# Patient Record
Sex: Male | Born: 1935
Health system: Southern US, Community
[De-identification: ages and names within clinical notes are randomized; demographics above are authoritative.]

## PROBLEM LIST (undated history)

## (undated) DIAGNOSIS — G5600 Carpal tunnel syndrome, unspecified upper limb: Secondary | ICD-10-CM

## (undated) DIAGNOSIS — E291 Testicular hypofunction: Principal | ICD-10-CM

## (undated) DIAGNOSIS — M5030 Other cervical disc degeneration, unspecified cervical region: Secondary | ICD-10-CM

## (undated) DIAGNOSIS — R351 Nocturia: Secondary | ICD-10-CM

## (undated) DIAGNOSIS — J3089 Other allergic rhinitis: Secondary | ICD-10-CM

## (undated) DIAGNOSIS — E785 Hyperlipidemia, unspecified: Secondary | ICD-10-CM

## (undated) DIAGNOSIS — U071 COVID-19: Secondary | ICD-10-CM

## (undated) DIAGNOSIS — Z950 Presence of cardiac pacemaker: Secondary | ICD-10-CM

## (undated) DIAGNOSIS — N400 Enlarged prostate without lower urinary tract symptoms: Secondary | ICD-10-CM

## (undated) DIAGNOSIS — K219 Gastro-esophageal reflux disease without esophagitis: Secondary | ICD-10-CM

## (undated) DIAGNOSIS — I2510 Atherosclerotic heart disease of native coronary artery without angina pectoris: Secondary | ICD-10-CM

## (undated) DIAGNOSIS — I10 Essential (primary) hypertension: Secondary | ICD-10-CM

## (undated) DIAGNOSIS — R634 Abnormal weight loss: Secondary | ICD-10-CM

## (undated) DIAGNOSIS — N529 Male erectile dysfunction, unspecified: Secondary | ICD-10-CM

## (undated) DIAGNOSIS — H409 Unspecified glaucoma: Secondary | ICD-10-CM

## (undated) HISTORY — PX: APPENDECTOMY: SHX54

## (undated) HISTORY — DX: Other cervical disc degeneration, unspecified cervical region: M50.30

## (undated) HISTORY — DX: Testicular hypofunction: E29.1

## (undated) HISTORY — DX: Hyperlipidemia, unspecified: E78.5

## (undated) HISTORY — DX: Atherosclerotic heart disease of native coronary artery without angina pectoris: I25.10

## (undated) HISTORY — PX: BILATERAL CARPAL TUNNEL RELEASE: SHX6508

## (undated) HISTORY — PX: KIDNEY STONE SURGERY: SHX686

## (undated) HISTORY — PX: CORONARY ANGIOPLASTY: SHX604

## (undated) HISTORY — DX: COVID-19: U07.1

## (undated) HISTORY — DX: Nocturia: R35.1

## (undated) HISTORY — PX: OTHER SURGICAL HISTORY: SHX169

## (undated) HISTORY — DX: Carpal tunnel syndrome, unspecified upper limb: G56.00

## (undated) HISTORY — DX: Benign prostatic hyperplasia without lower urinary tract symptoms: N40.0

## (undated) HISTORY — PX: SHOULDER ARTHROSCOPY W/ ROTATOR CUFF REPAIR: SHX2400

## (undated) HISTORY — PX: TONSILLECTOMY: SUR1361

## (undated) HISTORY — DX: Male erectile dysfunction, unspecified: N52.9

---

## 1998-05-02 DIAGNOSIS — I10 Essential (primary) hypertension: Secondary | ICD-10-CM | POA: Insufficient documentation

## 2002-05-02 HISTORY — PX: CARDIAC CATHETERIZATION: SHX172

## 2003-05-03 HISTORY — PX: CORONARY STENT PLACEMENT: SHX1402

## 2005-05-02 DIAGNOSIS — M940 Chondrocostal junction syndrome [Tietze]: Secondary | ICD-10-CM | POA: Insufficient documentation

## 2005-05-02 HISTORY — PX: CORONARY ARTERY BYPASS GRAFT: SHX141

## 2005-09-11 ENCOUNTER — Other Ambulatory Visit: Payer: Self-pay

## 2005-09-11 ENCOUNTER — Emergency Department: Payer: Self-pay | Admitting: Emergency Medicine

## 2005-10-26 ENCOUNTER — Encounter: Payer: Self-pay | Admitting: Family Medicine

## 2005-10-30 ENCOUNTER — Encounter: Payer: Self-pay | Admitting: Family Medicine

## 2005-11-30 ENCOUNTER — Encounter: Payer: Self-pay | Admitting: Family Medicine

## 2005-12-31 ENCOUNTER — Encounter: Payer: Self-pay | Admitting: Family Medicine

## 2006-01-30 ENCOUNTER — Encounter: Payer: Self-pay | Admitting: Family Medicine

## 2006-08-03 ENCOUNTER — Ambulatory Visit: Payer: Self-pay

## 2007-03-12 ENCOUNTER — Ambulatory Visit: Payer: Self-pay | Admitting: Family Medicine

## 2007-04-09 ENCOUNTER — Ambulatory Visit: Payer: Self-pay | Admitting: Unknown Physician Specialty

## 2007-04-11 ENCOUNTER — Ambulatory Visit: Payer: Self-pay | Admitting: Unknown Physician Specialty

## 2007-06-18 ENCOUNTER — Ambulatory Visit: Payer: Self-pay | Admitting: Internal Medicine

## 2007-07-05 ENCOUNTER — Ambulatory Visit (HOSPITAL_COMMUNITY): Admission: RE | Admit: 2007-07-05 | Discharge: 2007-07-05 | Payer: Self-pay | Admitting: Gastroenterology

## 2007-07-09 ENCOUNTER — Ambulatory Visit: Payer: Self-pay | Admitting: Gastroenterology

## 2007-08-07 ENCOUNTER — Ambulatory Visit: Payer: Self-pay | Admitting: Internal Medicine

## 2007-08-07 LAB — CONVERTED CEMR LAB
ALT: 27 units/L (ref 0–53)
AST: 28 units/L (ref 0–37)
Alkaline Phosphatase: 50 units/L (ref 39–117)
BUN: 12 mg/dL (ref 6–23)
Basophils Absolute: 0 10*3/uL (ref 0.0–0.1)
Chloride: 107 meq/L (ref 96–112)
Creatinine, Ser: 1 mg/dL (ref 0.4–1.5)
Eosinophils Absolute: 0.1 10*3/uL (ref 0.0–0.7)
Eosinophils Relative: 1.7 % (ref 0.0–5.0)
HCT: 39.2 % (ref 39.0–52.0)
Lipase: 37 units/L (ref 11.0–59.0)
MCHC: 32.6 g/dL (ref 30.0–36.0)
MCV: 98.7 fL (ref 78.0–100.0)
Monocytes Absolute: 0.6 10*3/uL (ref 0.1–1.0)
Neutrophils Relative %: 75.3 % (ref 43.0–77.0)
Platelets: 162 10*3/uL (ref 150–400)
Potassium: 4.9 meq/L (ref 3.5–5.1)
WBC: 6.1 10*3/uL (ref 4.5–10.5)

## 2007-10-31 ENCOUNTER — Ambulatory Visit: Payer: Self-pay | Admitting: Unknown Physician Specialty

## 2008-02-13 ENCOUNTER — Emergency Department: Payer: Self-pay | Admitting: Emergency Medicine

## 2008-05-08 ENCOUNTER — Ambulatory Visit: Payer: Self-pay | Admitting: Unknown Physician Specialty

## 2010-08-02 ENCOUNTER — Ambulatory Visit: Payer: Self-pay | Admitting: Unknown Physician Specialty

## 2010-08-02 HISTORY — PX: UPPER GI ENDOSCOPY: SHX6162

## 2010-08-03 LAB — PATHOLOGY REPORT

## 2010-09-14 NOTE — Assessment & Plan Note (Signed)
HEALTHCARE                         GASTROENTEROLOGY OFFICE NOTE   NAME:Sanderson, KAYCEE MCGAUGH                       MRN:          [id number]  DATE:08/07/2007                            DOB:          01-24-1932    HISTORY:  Mr. Rafferty presents today for followup.  He was initially  evaluated June 18, 2007 for biliary ductal dilation and  consideration for endoscopic retrograde cholangiopancreatography.  See  that dictation for details.   His chief complaint today is that of some mild epigastric soreness as  well as periodic diarrhea, which occurs on a semi-weekly basis.  The  patient had some abdominal discomfort prior to his last evaluation.  He  underwent endoscopic ultrasound which revealed a common duct stone.  At  that same time, his exam was converted to an ERCP with sphincterotomy  and stone extraction.  No other abnormalities of the pancreas or  pancreatic or biliary system were noted on either exam.  He states that  since his procedure, the bulk of his discomfort has improved.  His  appetite is good and his weight has been stable.  He denies nausea,  vomiting, or bleeding.  As mentioned he does have some periodic loose  stools and increased intestinal gas.  No steatorrhea.  He is  anticipating followup with Dr. Mechele Collin in the next few weeks, to resume  his GI care.   ALLERGIES:  HE HAS NO KNOWN DRUG ALLERGIES.   CURRENT MEDICATIONS:  Include:  Plavix,  Vytorin, Metoprolol, enalapril,  omeprazole, APAP/hydrocodone, aspirin, Zolpidem, and Loratadine p.r.n.   PHYSICAL EXAMINATION:  Well-appearing male in no acute distress.  Blood  pressure is 130/68.  Heart rate 68.  Respirations 20 and regular.  His  weight is 175.2 pounds (increased 0.6 pounds).  HEENT:  Sclerae anicteric.  Conjunctivae are pink.  Oral mucosa is  intact.  No adenopathy.  LUNGS:  Clear.  HEART:  Regular.  ABDOMEN:  Soft without tenderness, masses, or hernia.  EXTREMITIES:   Without edema.   IMPRESSION:  1. Choledocholithiasis status post ERCP with biliary sphincterotomy      and stone extraction.  2. Endoscopic ultrasound, negative except for choledocholithiasis.  3. Vague epigastric discomfort and gas, ongoing.  4. Intermittent loose stools without worrisome features.  Prior      negative colonoscopy with Dr. Mechele Collin.   RECOMMENDATION:  1. Laboratories today have been ordered.  CBC was normal except for a      hemoglobin of 12.8 (stable compared to 12-08).  Comprehensive      metabolic panel was normal.  2. Empiric treatment with the Probiotic Align 1 daily for 2 weeks to      see if this helps with gas and occassional diarrhea.  Samples have      been give.  3. Resume GI care with Dr. Mechele Collin as already plannrd.  Followup in      this office as needed or requested by Dr.  Mechele Collin.     Wilhemina Bonito. Marina Goodell, MD  Electronically Signed    JNP/MedQ  DD: 08/08/2007  DT: 08/08/2007  Job #: [id number]  cc:   Suzette Battiest

## 2010-09-14 NOTE — Assessment & Plan Note (Signed)
[facility]                         GASTROENTEROLOGY OFFICE NOTE   NAME:Ivan Salazar, Ivan Salazar                       MRN:          [id number]  DATE:06/18/2007                            DOB:          1931-12-03    REFERRING PHYSICIAN:  Lynnae Prude   REASON FOR CONSULTATION:  Biliary ductal dilation and consideration of  endoscopic retrograde cholangiopancreatography.   HISTORY:  This is a pleasant 77 year old white male with a history of  hypertension, dyslipidemia and coronary artery disease, for which he has  undergone a coronary artery bypass grafting.  The patient has had  problems with nausea, dating back approximately one year.  He was put on  proton pump inhibitors, which he took sporadically.  In November, his  nausea became daily.  As well, he began to develop a gnawing discomfort  in the mid-abdomen.  The discomfort seemed to be relieved with meals.  If he would eat a normal supper, he would have no problems the rest of  the evening.  He denies heartburn, indigestion, dysphagia, vomiting,  fevers or jaundice.  The discomfort does not radiate.  He has had no  weight loss.  His bowel habits tend to be loose, two to three times per  day.  He does use Imodium.  The patient is being evaluated for his pain  and nausea by Dr. Lynnae Prude.  An upper endoscopy was performed in  early December and revealed a Schatzki's ring, hiatal hernia and  nonspecific gastritis.  He is anticipating a colonoscopy with Dr.  Mechele Collin in March.  The patient did have a CT scan, abdominal ultrasound  and a HIDA scan.  I have those reports but do not have the compact disks  for review.  The CT scan was said to show intra and extrahepatic biliary  ductal dilation.  No dimensions given.  There were no other problems  such as mass or stones.  He is status post cholecystectomy and these  changes were possibly felt to be physiologic.  An ultrasound revealed a  13 mm common  duct.  No mention of intrahepatic dilation.  The HIDA scan  was unremarkable postcholecystectomy.  He did undergo laboratories in early November.  These were also  unrevealing.  Of importance, there was no evidence of anemia.  Furthermore, his liver tests were entirely normal.  ERCP was recommended  and the patient requsted seeing me.   PAST MEDICAL HISTORY:  As above.   PAST SURGICAL HISTORY:  In addition to his cholecystectomy and coronary  artery bypass graft surgery, the patient has had bilateral rotator cuff  surgery, tonsillectomy, an appendectomy and a hernia repair.   ALLERGIES:  No known drug allergies.   CURRENT MEDICATIONS:  1. Plavix 75 mg daily.  2. Vytorin 10/80 mg daily.  3. Metoprolol 25 mg twice daily.  4. Enalapril 5 mg daily.  5. Omeprazole 20 mg twice daily.  6. A-PAP with hydrocodone 500/7.5 mg p.r.n.  7. Aspirin.  8. Ambien.  9. Loratadine.   FAMILY HISTORY:  Negative for gastrointestinal malignancy.   SOCIAL HISTORY:  The patient is married  with five children.  His wife is  currently in a long-term nursing facility.  He does not smoke.  He has  two or three drinks per night.   REVIEW OF SYSTEMS:  Per diagnostic evaluation form.   PHYSICAL EXAMINATION:  GENERAL:  A well-appearing male, in no acute  distress.  VITAL SIGNS:  Blood pressure 114/62, heart rate 60, weight 174.6 pounds,  height 5 feet 10 inches.  HEENT:  Sclerae anicteric.  Conjunctivae pink.  Oral mucosa intact.  NECK:  There is no adenopathy.  LUNGS:  Clear.  HEART:  Regular.  ABDOMEN:  Soft without tenderness, mass or hernia.  Previous surgical  incisions are well-healed.  Good bowel sounds heard.  EXTREMITIES:  Without edema.   IMPRESSION:  1. Longstanding vague nausea and gnawing discomfort, improved with      meals.  No other significant symptoms or worrisome features.  2. Extra-hepatic biliary dilation on ultrasound.  Reported      intrahepatic and extrahepatic ductal dilation  on CT, with normal      liver tests.  I imagine this is non-pathologic.  Further evaluation      should be pursued, however.  3. Multiple medical problems, including Plavix therapy.   RECOMMENDATIONS:  At this point, I think the best strategy would be to  proceed with an endoscopic ultrasound.  The endoscopic ultrasound would  allow for visualization of the bile duct as well as the pancreas and  ampulla.  If this is negative, then nothing further would need to be  done, and the patient would avoid the potential risks associated with  ERCP. However, if biliary pathology or pancreatic pathology is  identified, then the patient could undergo diagnostic biopsy and/or  appropriate therapeutics at that time.  I discussed this with him, as  well as the role for MRCP.  The patient is in agreement with proceeding  to endoscopic ultrasound plus or minus concurrent endoscopic retrograde  cholangiopancreatography, depending upon the findings.  I have discussed  with him the nature of the procedure, as well as the risks, benefits and  alternatives.  I explained to him that my colleague, Dr. Rachael Fee, is the local expert for this procedure, and we would arrange an  exam with Dr. Christella Hartigan. I have discussed the case with Dr. Christella Hartigan.  Furthermore, we will communicate with Dr. Christella Noa (his  cardiologist), to see if it would be acceptable to take the patient off  of Plavix for five to seven days prior to his procedure, should  therapeutics be needed.  Again, he understood these issues and agreed to  proceed along these lines.  After his procedure with Dr. Christella Hartigan, he will  return to the care of Dr. Lynnae Prude for further evaluation and treatment of his abdominal complaints  as deemed neccessary by Dr. Mechele Collin.     Wilhemina Bonito. Marina Goodell, MD  Electronically Signed    JNP/MedQ  DD: 06/18/2007  DT: 06/19/2007  Job #: [id number]   cc:   Kateri Mc, M.D.  Rachael Fee, MD

## 2011-01-18 DIAGNOSIS — M7070 Other bursitis of hip, unspecified hip: Secondary | ICD-10-CM | POA: Insufficient documentation

## 2011-05-05 ENCOUNTER — Ambulatory Visit: Payer: Self-pay | Admitting: Specialist

## 2011-05-05 DIAGNOSIS — Z01812 Encounter for preprocedural laboratory examination: Secondary | ICD-10-CM | POA: Diagnosis not present

## 2011-05-05 DIAGNOSIS — Z01810 Encounter for preprocedural cardiovascular examination: Secondary | ICD-10-CM | POA: Diagnosis not present

## 2011-05-05 DIAGNOSIS — I10 Essential (primary) hypertension: Secondary | ICD-10-CM | POA: Diagnosis not present

## 2011-05-05 DIAGNOSIS — G5600 Carpal tunnel syndrome, unspecified upper limb: Secondary | ICD-10-CM | POA: Diagnosis not present

## 2011-05-05 LAB — BASIC METABOLIC PANEL
BUN: 28 mg/dL — ABNORMAL HIGH (ref 7–18)
EGFR (Non-African Amer.): >60
Glucose: 100 mg/dL — ABNORMAL HIGH (ref 65–99)
Potassium: 5.3 mmol/L — ABNORMAL HIGH (ref 3.5–5.1)
Sodium: 142 mmol/L (ref 136–145)

## 2011-05-05 LAB — CBC WITH DIFFERENTIAL/PLATELET
Basophil %: 0.3 %
Eosinophil #: 0.1 10*3/uL (ref 0.0–0.7)
Eosinophil %: 2.1 %
HGB: 12.7 g/dL — ABNORMAL LOW (ref 13.0–18.0)
Lymphocyte %: 13.6 %
MCH: 33.4 pg (ref 26.0–34.0)
Monocyte %: 9.8 %
Neutrophil %: 74.2 %
RBC: 3.8 10*6/uL — ABNORMAL LOW (ref 4.40–5.90)

## 2011-05-11 DIAGNOSIS — E291 Testicular hypofunction: Secondary | ICD-10-CM | POA: Diagnosis not present

## 2011-05-18 ENCOUNTER — Ambulatory Visit: Payer: Self-pay | Admitting: Specialist

## 2011-05-18 DIAGNOSIS — R609 Edema, unspecified: Secondary | ICD-10-CM | POA: Diagnosis not present

## 2011-05-18 DIAGNOSIS — G5600 Carpal tunnel syndrome, unspecified upper limb: Secondary | ICD-10-CM | POA: Diagnosis not present

## 2011-05-18 DIAGNOSIS — K449 Diaphragmatic hernia without obstruction or gangrene: Secondary | ICD-10-CM | POA: Diagnosis not present

## 2011-05-18 DIAGNOSIS — K219 Gastro-esophageal reflux disease without esophagitis: Secondary | ICD-10-CM | POA: Diagnosis not present

## 2011-05-18 DIAGNOSIS — Z951 Presence of aortocoronary bypass graft: Secondary | ICD-10-CM | POA: Diagnosis not present

## 2011-05-18 DIAGNOSIS — M1610 Unilateral primary osteoarthritis, unspecified hip: Secondary | ICD-10-CM | POA: Diagnosis not present

## 2011-05-18 DIAGNOSIS — Z79899 Other long term (current) drug therapy: Secondary | ICD-10-CM | POA: Diagnosis not present

## 2011-05-18 DIAGNOSIS — I10 Essential (primary) hypertension: Secondary | ICD-10-CM | POA: Diagnosis not present

## 2011-05-18 DIAGNOSIS — I252 Old myocardial infarction: Secondary | ICD-10-CM | POA: Diagnosis not present

## 2011-05-25 DIAGNOSIS — G4700 Insomnia, unspecified: Secondary | ICD-10-CM | POA: Diagnosis not present

## 2011-05-25 DIAGNOSIS — S51809A Unspecified open wound of unspecified forearm, initial encounter: Secondary | ICD-10-CM | POA: Diagnosis not present

## 2011-05-30 DIAGNOSIS — G5600 Carpal tunnel syndrome, unspecified upper limb: Secondary | ICD-10-CM | POA: Diagnosis not present

## 2011-06-01 ENCOUNTER — Ambulatory Visit: Payer: Self-pay | Admitting: Specialist

## 2011-06-01 DIAGNOSIS — G5600 Carpal tunnel syndrome, unspecified upper limb: Secondary | ICD-10-CM | POA: Diagnosis not present

## 2011-06-01 DIAGNOSIS — Z7902 Long term (current) use of antithrombotics/antiplatelets: Secondary | ICD-10-CM | POA: Diagnosis not present

## 2011-06-01 DIAGNOSIS — I2510 Atherosclerotic heart disease of native coronary artery without angina pectoris: Secondary | ICD-10-CM | POA: Diagnosis not present

## 2011-06-01 DIAGNOSIS — Z951 Presence of aortocoronary bypass graft: Secondary | ICD-10-CM | POA: Diagnosis not present

## 2011-06-01 DIAGNOSIS — K219 Gastro-esophageal reflux disease without esophagitis: Secondary | ICD-10-CM | POA: Diagnosis not present

## 2011-06-01 DIAGNOSIS — Z79899 Other long term (current) drug therapy: Secondary | ICD-10-CM | POA: Diagnosis not present

## 2011-06-01 DIAGNOSIS — I10 Essential (primary) hypertension: Secondary | ICD-10-CM | POA: Diagnosis not present

## 2011-06-01 DIAGNOSIS — R609 Edema, unspecified: Secondary | ICD-10-CM | POA: Diagnosis not present

## 2011-06-13 DIAGNOSIS — G5600 Carpal tunnel syndrome, unspecified upper limb: Secondary | ICD-10-CM | POA: Diagnosis not present

## 2011-06-14 DIAGNOSIS — F4323 Adjustment disorder with mixed anxiety and depressed mood: Secondary | ICD-10-CM | POA: Diagnosis not present

## 2011-06-15 DIAGNOSIS — E291 Testicular hypofunction: Secondary | ICD-10-CM | POA: Diagnosis not present

## 2011-06-28 DIAGNOSIS — F4323 Adjustment disorder with mixed anxiety and depressed mood: Secondary | ICD-10-CM | POA: Diagnosis not present

## 2011-07-19 DIAGNOSIS — F4323 Adjustment disorder with mixed anxiety and depressed mood: Secondary | ICD-10-CM | POA: Diagnosis not present

## 2011-07-21 DIAGNOSIS — Z79899 Other long term (current) drug therapy: Secondary | ICD-10-CM | POA: Diagnosis not present

## 2011-07-21 DIAGNOSIS — E291 Testicular hypofunction: Secondary | ICD-10-CM | POA: Diagnosis not present

## 2011-07-21 DIAGNOSIS — N401 Enlarged prostate with lower urinary tract symptoms: Secondary | ICD-10-CM | POA: Diagnosis not present

## 2011-07-26 DIAGNOSIS — F4323 Adjustment disorder with mixed anxiety and depressed mood: Secondary | ICD-10-CM | POA: Diagnosis not present

## 2011-08-02 DIAGNOSIS — F4323 Adjustment disorder with mixed anxiety and depressed mood: Secondary | ICD-10-CM | POA: Diagnosis not present

## 2011-08-09 DIAGNOSIS — E785 Hyperlipidemia, unspecified: Secondary | ICD-10-CM | POA: Diagnosis not present

## 2011-08-09 DIAGNOSIS — I10 Essential (primary) hypertension: Secondary | ICD-10-CM | POA: Diagnosis not present

## 2011-08-10 DIAGNOSIS — G5600 Carpal tunnel syndrome, unspecified upper limb: Secondary | ICD-10-CM | POA: Diagnosis not present

## 2011-08-15 DIAGNOSIS — E785 Hyperlipidemia, unspecified: Secondary | ICD-10-CM | POA: Diagnosis not present

## 2011-08-15 DIAGNOSIS — I10 Essential (primary) hypertension: Secondary | ICD-10-CM | POA: Diagnosis not present

## 2011-08-15 DIAGNOSIS — G5600 Carpal tunnel syndrome, unspecified upper limb: Secondary | ICD-10-CM | POA: Diagnosis not present

## 2011-08-16 DIAGNOSIS — F4323 Adjustment disorder with mixed anxiety and depressed mood: Secondary | ICD-10-CM | POA: Diagnosis not present

## 2011-08-24 DIAGNOSIS — E291 Testicular hypofunction: Secondary | ICD-10-CM | POA: Diagnosis not present

## 2011-08-26 DIAGNOSIS — G5600 Carpal tunnel syndrome, unspecified upper limb: Secondary | ICD-10-CM | POA: Diagnosis not present

## 2011-08-30 DIAGNOSIS — G5600 Carpal tunnel syndrome, unspecified upper limb: Secondary | ICD-10-CM | POA: Diagnosis not present

## 2011-08-30 DIAGNOSIS — H4010X0 Unspecified open-angle glaucoma, stage unspecified: Secondary | ICD-10-CM | POA: Diagnosis not present

## 2011-09-01 DIAGNOSIS — G5600 Carpal tunnel syndrome, unspecified upper limb: Secondary | ICD-10-CM | POA: Diagnosis not present

## 2011-09-05 DIAGNOSIS — G5600 Carpal tunnel syndrome, unspecified upper limb: Secondary | ICD-10-CM | POA: Diagnosis not present

## 2011-09-13 DIAGNOSIS — F4323 Adjustment disorder with mixed anxiety and depressed mood: Secondary | ICD-10-CM | POA: Diagnosis not present

## 2011-09-13 DIAGNOSIS — I10 Essential (primary) hypertension: Secondary | ICD-10-CM | POA: Diagnosis not present

## 2011-09-14 DIAGNOSIS — G5600 Carpal tunnel syndrome, unspecified upper limb: Secondary | ICD-10-CM | POA: Diagnosis not present

## 2011-09-15 DIAGNOSIS — I2510 Atherosclerotic heart disease of native coronary artery without angina pectoris: Secondary | ICD-10-CM | POA: Diagnosis not present

## 2011-09-15 DIAGNOSIS — I517 Cardiomegaly: Secondary | ICD-10-CM | POA: Diagnosis not present

## 2011-09-15 DIAGNOSIS — I10 Essential (primary) hypertension: Secondary | ICD-10-CM | POA: Diagnosis not present

## 2011-09-15 DIAGNOSIS — E782 Mixed hyperlipidemia: Secondary | ICD-10-CM | POA: Diagnosis not present

## 2011-09-15 DIAGNOSIS — Z7902 Long term (current) use of antithrombotics/antiplatelets: Secondary | ICD-10-CM | POA: Diagnosis not present

## 2011-09-15 DIAGNOSIS — I359 Nonrheumatic aortic valve disorder, unspecified: Secondary | ICD-10-CM | POA: Diagnosis not present

## 2011-09-15 DIAGNOSIS — Z79899 Other long term (current) drug therapy: Secondary | ICD-10-CM | POA: Diagnosis not present

## 2011-09-15 DIAGNOSIS — I059 Rheumatic mitral valve disease, unspecified: Secondary | ICD-10-CM | POA: Diagnosis not present

## 2011-09-15 DIAGNOSIS — Z951 Presence of aortocoronary bypass graft: Secondary | ICD-10-CM | POA: Diagnosis not present

## 2011-09-22 DIAGNOSIS — G5600 Carpal tunnel syndrome, unspecified upper limb: Secondary | ICD-10-CM | POA: Diagnosis not present

## 2011-09-28 DIAGNOSIS — E291 Testicular hypofunction: Secondary | ICD-10-CM | POA: Diagnosis not present

## 2011-10-21 DIAGNOSIS — L570 Actinic keratosis: Secondary | ICD-10-CM | POA: Diagnosis not present

## 2011-10-21 DIAGNOSIS — D0460 Carcinoma in situ of skin of unspecified upper limb, including shoulder: Secondary | ICD-10-CM | POA: Diagnosis not present

## 2011-10-21 DIAGNOSIS — D485 Neoplasm of uncertain behavior of skin: Secondary | ICD-10-CM | POA: Diagnosis not present

## 2011-10-21 DIAGNOSIS — L820 Inflamed seborrheic keratosis: Secondary | ICD-10-CM | POA: Diagnosis not present

## 2011-10-21 DIAGNOSIS — Z85828 Personal history of other malignant neoplasm of skin: Secondary | ICD-10-CM | POA: Diagnosis not present

## 2011-10-25 DIAGNOSIS — F4323 Adjustment disorder with mixed anxiety and depressed mood: Secondary | ICD-10-CM | POA: Diagnosis not present

## 2011-11-01 DIAGNOSIS — E291 Testicular hypofunction: Secondary | ICD-10-CM | POA: Diagnosis not present

## 2011-11-22 DIAGNOSIS — F4323 Adjustment disorder with mixed anxiety and depressed mood: Secondary | ICD-10-CM | POA: Diagnosis not present

## 2011-11-25 DIAGNOSIS — D0460 Carcinoma in situ of skin of unspecified upper limb, including shoulder: Secondary | ICD-10-CM | POA: Diagnosis not present

## 2011-12-13 DIAGNOSIS — E291 Testicular hypofunction: Secondary | ICD-10-CM | POA: Diagnosis not present

## 2012-01-31 DIAGNOSIS — E291 Testicular hypofunction: Secondary | ICD-10-CM | POA: Diagnosis not present

## 2012-02-07 DIAGNOSIS — F4323 Adjustment disorder with mixed anxiety and depressed mood: Secondary | ICD-10-CM | POA: Diagnosis not present

## 2012-03-05 DIAGNOSIS — H6120 Impacted cerumen, unspecified ear: Secondary | ICD-10-CM | POA: Diagnosis not present

## 2012-03-05 DIAGNOSIS — H903 Sensorineural hearing loss, bilateral: Secondary | ICD-10-CM | POA: Diagnosis not present

## 2012-03-06 DIAGNOSIS — F4323 Adjustment disorder with mixed anxiety and depressed mood: Secondary | ICD-10-CM | POA: Diagnosis not present

## 2012-03-07 DIAGNOSIS — E291 Testicular hypofunction: Secondary | ICD-10-CM | POA: Diagnosis not present

## 2012-03-09 DIAGNOSIS — E785 Hyperlipidemia, unspecified: Secondary | ICD-10-CM | POA: Diagnosis not present

## 2012-03-09 DIAGNOSIS — R5383 Other fatigue: Secondary | ICD-10-CM | POA: Diagnosis not present

## 2012-03-19 DIAGNOSIS — I10 Essential (primary) hypertension: Secondary | ICD-10-CM | POA: Diagnosis not present

## 2012-03-19 DIAGNOSIS — E785 Hyperlipidemia, unspecified: Secondary | ICD-10-CM | POA: Diagnosis not present

## 2012-03-19 DIAGNOSIS — I2510 Atherosclerotic heart disease of native coronary artery without angina pectoris: Secondary | ICD-10-CM | POA: Diagnosis not present

## 2012-03-19 DIAGNOSIS — Z23 Encounter for immunization: Secondary | ICD-10-CM | POA: Diagnosis not present

## 2012-03-19 DIAGNOSIS — G4700 Insomnia, unspecified: Secondary | ICD-10-CM | POA: Diagnosis not present

## 2012-04-03 DIAGNOSIS — F4323 Adjustment disorder with mixed anxiety and depressed mood: Secondary | ICD-10-CM | POA: Diagnosis not present

## 2012-04-04 DIAGNOSIS — H4010X0 Unspecified open-angle glaucoma, stage unspecified: Secondary | ICD-10-CM | POA: Diagnosis not present

## 2012-04-11 DIAGNOSIS — E291 Testicular hypofunction: Secondary | ICD-10-CM | POA: Diagnosis not present

## 2012-05-15 DIAGNOSIS — F4323 Adjustment disorder with mixed anxiety and depressed mood: Secondary | ICD-10-CM | POA: Diagnosis not present

## 2012-05-21 DIAGNOSIS — L570 Actinic keratosis: Secondary | ICD-10-CM | POA: Diagnosis not present

## 2012-05-21 DIAGNOSIS — L910 Hypertrophic scar: Secondary | ICD-10-CM | POA: Diagnosis not present

## 2012-05-21 DIAGNOSIS — L820 Inflamed seborrheic keratosis: Secondary | ICD-10-CM | POA: Diagnosis not present

## 2012-05-21 DIAGNOSIS — C44611 Basal cell carcinoma of skin of unspecified upper limb, including shoulder: Secondary | ICD-10-CM | POA: Diagnosis not present

## 2012-05-23 DIAGNOSIS — E291 Testicular hypofunction: Secondary | ICD-10-CM | POA: Diagnosis not present

## 2012-05-31 DIAGNOSIS — H905 Unspecified sensorineural hearing loss: Secondary | ICD-10-CM | POA: Diagnosis not present

## 2012-06-19 DIAGNOSIS — F4323 Adjustment disorder with mixed anxiety and depressed mood: Secondary | ICD-10-CM | POA: Diagnosis not present

## 2012-08-01 DIAGNOSIS — E291 Testicular hypofunction: Secondary | ICD-10-CM | POA: Diagnosis not present

## 2012-08-28 DIAGNOSIS — F4323 Adjustment disorder with mixed anxiety and depressed mood: Secondary | ICD-10-CM | POA: Diagnosis not present

## 2012-09-06 DIAGNOSIS — E291 Testicular hypofunction: Secondary | ICD-10-CM | POA: Diagnosis not present

## 2012-09-25 DIAGNOSIS — F4323 Adjustment disorder with mixed anxiety and depressed mood: Secondary | ICD-10-CM | POA: Diagnosis not present

## 2012-10-03 DIAGNOSIS — I10 Essential (primary) hypertension: Secondary | ICD-10-CM | POA: Diagnosis not present

## 2012-10-03 DIAGNOSIS — E785 Hyperlipidemia, unspecified: Secondary | ICD-10-CM | POA: Diagnosis not present

## 2012-10-11 DIAGNOSIS — I2510 Atherosclerotic heart disease of native coronary artery without angina pectoris: Secondary | ICD-10-CM | POA: Diagnosis not present

## 2012-10-11 DIAGNOSIS — E785 Hyperlipidemia, unspecified: Secondary | ICD-10-CM | POA: Diagnosis not present

## 2012-10-11 DIAGNOSIS — I10 Essential (primary) hypertension: Secondary | ICD-10-CM | POA: Diagnosis not present

## 2012-10-15 DIAGNOSIS — W57XXXA Bitten or stung by nonvenomous insect and other nonvenomous arthropods, initial encounter: Secondary | ICD-10-CM | POA: Diagnosis not present

## 2012-10-15 DIAGNOSIS — Z23 Encounter for immunization: Secondary | ICD-10-CM | POA: Diagnosis not present

## 2012-10-15 DIAGNOSIS — T148 Other injury of unspecified body region: Secondary | ICD-10-CM | POA: Diagnosis not present

## 2012-10-15 DIAGNOSIS — I2510 Atherosclerotic heart disease of native coronary artery without angina pectoris: Secondary | ICD-10-CM | POA: Diagnosis not present

## 2012-10-18 DIAGNOSIS — E291 Testicular hypofunction: Secondary | ICD-10-CM | POA: Diagnosis not present

## 2012-11-06 DIAGNOSIS — F4323 Adjustment disorder with mixed anxiety and depressed mood: Secondary | ICD-10-CM | POA: Diagnosis not present

## 2012-11-22 DIAGNOSIS — E291 Testicular hypofunction: Secondary | ICD-10-CM | POA: Diagnosis not present

## 2013-01-01 DIAGNOSIS — E291 Testicular hypofunction: Secondary | ICD-10-CM | POA: Diagnosis not present

## 2013-01-15 DIAGNOSIS — F4323 Adjustment disorder with mixed anxiety and depressed mood: Secondary | ICD-10-CM | POA: Diagnosis not present

## 2013-01-25 DIAGNOSIS — Z23 Encounter for immunization: Secondary | ICD-10-CM | POA: Diagnosis not present

## 2013-01-25 DIAGNOSIS — T07XXXA Unspecified multiple injuries, initial encounter: Secondary | ICD-10-CM | POA: Diagnosis not present

## 2013-01-25 DIAGNOSIS — I2510 Atherosclerotic heart disease of native coronary artery without angina pectoris: Secondary | ICD-10-CM | POA: Diagnosis not present

## 2013-02-20 DIAGNOSIS — E291 Testicular hypofunction: Secondary | ICD-10-CM | POA: Diagnosis not present

## 2013-02-22 DIAGNOSIS — Z23 Encounter for immunization: Secondary | ICD-10-CM | POA: Diagnosis not present

## 2013-02-26 DIAGNOSIS — H4010X0 Unspecified open-angle glaucoma, stage unspecified: Secondary | ICD-10-CM | POA: Diagnosis not present

## 2013-03-04 DIAGNOSIS — M779 Enthesopathy, unspecified: Secondary | ICD-10-CM | POA: Diagnosis not present

## 2013-03-04 DIAGNOSIS — Q6689 Other  specified congenital deformities of feet: Secondary | ICD-10-CM | POA: Diagnosis not present

## 2013-04-01 DIAGNOSIS — M6530 Trigger finger, unspecified finger: Secondary | ICD-10-CM | POA: Diagnosis not present

## 2013-04-02 DIAGNOSIS — E291 Testicular hypofunction: Secondary | ICD-10-CM | POA: Diagnosis not present

## 2013-04-02 DIAGNOSIS — F4323 Adjustment disorder with mixed anxiety and depressed mood: Secondary | ICD-10-CM | POA: Diagnosis not present

## 2013-05-24 DIAGNOSIS — E291 Testicular hypofunction: Secondary | ICD-10-CM | POA: Diagnosis not present

## 2013-06-25 DIAGNOSIS — E291 Testicular hypofunction: Secondary | ICD-10-CM | POA: Diagnosis not present

## 2013-07-02 DIAGNOSIS — F4323 Adjustment disorder with mixed anxiety and depressed mood: Secondary | ICD-10-CM | POA: Diagnosis not present

## 2013-07-23 DIAGNOSIS — I2510 Atherosclerotic heart disease of native coronary artery without angina pectoris: Secondary | ICD-10-CM | POA: Diagnosis not present

## 2013-07-23 DIAGNOSIS — M7150 Other bursitis, not elsewhere classified, unspecified site: Secondary | ICD-10-CM | POA: Diagnosis not present

## 2013-07-23 DIAGNOSIS — Z23 Encounter for immunization: Secondary | ICD-10-CM | POA: Diagnosis not present

## 2013-07-26 DIAGNOSIS — M76899 Other specified enthesopathies of unspecified lower limb, excluding foot: Secondary | ICD-10-CM | POA: Diagnosis not present

## 2013-07-26 DIAGNOSIS — L821 Other seborrheic keratosis: Secondary | ICD-10-CM | POA: Diagnosis not present

## 2013-07-26 DIAGNOSIS — M25559 Pain in unspecified hip: Secondary | ICD-10-CM | POA: Diagnosis not present

## 2013-07-26 DIAGNOSIS — D230 Other benign neoplasm of skin of lip: Secondary | ICD-10-CM | POA: Diagnosis not present

## 2013-07-26 DIAGNOSIS — Z23 Encounter for immunization: Secondary | ICD-10-CM | POA: Diagnosis not present

## 2013-08-06 DIAGNOSIS — E291 Testicular hypofunction: Secondary | ICD-10-CM | POA: Diagnosis not present

## 2013-08-09 DIAGNOSIS — IMO0002 Reserved for concepts with insufficient information to code with codable children: Secondary | ICD-10-CM | POA: Diagnosis not present

## 2013-08-09 DIAGNOSIS — M6530 Trigger finger, unspecified finger: Secondary | ICD-10-CM | POA: Diagnosis not present

## 2013-08-16 DIAGNOSIS — M25559 Pain in unspecified hip: Secondary | ICD-10-CM | POA: Diagnosis not present

## 2013-08-16 DIAGNOSIS — Z23 Encounter for immunization: Secondary | ICD-10-CM | POA: Diagnosis not present

## 2013-08-16 DIAGNOSIS — M545 Low back pain, unspecified: Secondary | ICD-10-CM | POA: Diagnosis not present

## 2013-08-16 DIAGNOSIS — M76899 Other specified enthesopathies of unspecified lower limb, excluding foot: Secondary | ICD-10-CM | POA: Diagnosis not present

## 2013-08-26 DIAGNOSIS — IMO0002 Reserved for concepts with insufficient information to code with codable children: Secondary | ICD-10-CM | POA: Diagnosis not present

## 2013-08-26 DIAGNOSIS — M6530 Trigger finger, unspecified finger: Secondary | ICD-10-CM | POA: Diagnosis not present

## 2013-09-03 DIAGNOSIS — IMO0002 Reserved for concepts with insufficient information to code with codable children: Secondary | ICD-10-CM | POA: Diagnosis not present

## 2013-09-03 DIAGNOSIS — R293 Abnormal posture: Secondary | ICD-10-CM | POA: Diagnosis not present

## 2013-09-03 DIAGNOSIS — M5380 Other specified dorsopathies, site unspecified: Secondary | ICD-10-CM | POA: Diagnosis not present

## 2013-09-05 DIAGNOSIS — R293 Abnormal posture: Secondary | ICD-10-CM | POA: Diagnosis not present

## 2013-09-05 DIAGNOSIS — M5380 Other specified dorsopathies, site unspecified: Secondary | ICD-10-CM | POA: Diagnosis not present

## 2013-09-05 DIAGNOSIS — IMO0002 Reserved for concepts with insufficient information to code with codable children: Secondary | ICD-10-CM | POA: Diagnosis not present

## 2013-09-10 DIAGNOSIS — IMO0002 Reserved for concepts with insufficient information to code with codable children: Secondary | ICD-10-CM | POA: Diagnosis not present

## 2013-09-10 DIAGNOSIS — M5380 Other specified dorsopathies, site unspecified: Secondary | ICD-10-CM | POA: Diagnosis not present

## 2013-09-10 DIAGNOSIS — R293 Abnormal posture: Secondary | ICD-10-CM | POA: Diagnosis not present

## 2013-09-12 DIAGNOSIS — R293 Abnormal posture: Secondary | ICD-10-CM | POA: Diagnosis not present

## 2013-09-12 DIAGNOSIS — M5380 Other specified dorsopathies, site unspecified: Secondary | ICD-10-CM | POA: Diagnosis not present

## 2013-09-12 DIAGNOSIS — IMO0002 Reserved for concepts with insufficient information to code with codable children: Secondary | ICD-10-CM | POA: Diagnosis not present

## 2013-09-16 DIAGNOSIS — E291 Testicular hypofunction: Secondary | ICD-10-CM | POA: Diagnosis not present

## 2013-09-17 DIAGNOSIS — IMO0002 Reserved for concepts with insufficient information to code with codable children: Secondary | ICD-10-CM | POA: Diagnosis not present

## 2013-09-17 DIAGNOSIS — M5380 Other specified dorsopathies, site unspecified: Secondary | ICD-10-CM | POA: Diagnosis not present

## 2013-09-17 DIAGNOSIS — F4323 Adjustment disorder with mixed anxiety and depressed mood: Secondary | ICD-10-CM | POA: Diagnosis not present

## 2013-09-17 DIAGNOSIS — R293 Abnormal posture: Secondary | ICD-10-CM | POA: Diagnosis not present

## 2013-09-19 DIAGNOSIS — IMO0002 Reserved for concepts with insufficient information to code with codable children: Secondary | ICD-10-CM | POA: Diagnosis not present

## 2013-09-19 DIAGNOSIS — M5380 Other specified dorsopathies, site unspecified: Secondary | ICD-10-CM | POA: Diagnosis not present

## 2013-09-19 DIAGNOSIS — R293 Abnormal posture: Secondary | ICD-10-CM | POA: Diagnosis not present

## 2013-09-24 DIAGNOSIS — M5380 Other specified dorsopathies, site unspecified: Secondary | ICD-10-CM | POA: Diagnosis not present

## 2013-09-24 DIAGNOSIS — IMO0002 Reserved for concepts with insufficient information to code with codable children: Secondary | ICD-10-CM | POA: Diagnosis not present

## 2013-09-24 DIAGNOSIS — R293 Abnormal posture: Secondary | ICD-10-CM | POA: Diagnosis not present

## 2013-09-26 DIAGNOSIS — R293 Abnormal posture: Secondary | ICD-10-CM | POA: Diagnosis not present

## 2013-09-26 DIAGNOSIS — IMO0002 Reserved for concepts with insufficient information to code with codable children: Secondary | ICD-10-CM | POA: Diagnosis not present

## 2013-09-26 DIAGNOSIS — M5380 Other specified dorsopathies, site unspecified: Secondary | ICD-10-CM | POA: Diagnosis not present

## 2013-10-01 DIAGNOSIS — M76899 Other specified enthesopathies of unspecified lower limb, excluding foot: Secondary | ICD-10-CM | POA: Diagnosis not present

## 2013-10-01 DIAGNOSIS — R293 Abnormal posture: Secondary | ICD-10-CM | POA: Diagnosis not present

## 2013-10-01 DIAGNOSIS — M5380 Other specified dorsopathies, site unspecified: Secondary | ICD-10-CM | POA: Diagnosis not present

## 2013-10-01 DIAGNOSIS — Z23 Encounter for immunization: Secondary | ICD-10-CM | POA: Diagnosis not present

## 2013-10-01 DIAGNOSIS — M5137 Other intervertebral disc degeneration, lumbosacral region: Secondary | ICD-10-CM | POA: Diagnosis not present

## 2013-10-01 DIAGNOSIS — M25559 Pain in unspecified hip: Secondary | ICD-10-CM | POA: Diagnosis not present

## 2013-10-01 DIAGNOSIS — IMO0002 Reserved for concepts with insufficient information to code with codable children: Secondary | ICD-10-CM | POA: Diagnosis not present

## 2013-10-03 DIAGNOSIS — M5380 Other specified dorsopathies, site unspecified: Secondary | ICD-10-CM | POA: Diagnosis not present

## 2013-10-03 DIAGNOSIS — R293 Abnormal posture: Secondary | ICD-10-CM | POA: Diagnosis not present

## 2013-10-04 DIAGNOSIS — R293 Abnormal posture: Secondary | ICD-10-CM | POA: Diagnosis not present

## 2013-10-04 DIAGNOSIS — M5380 Other specified dorsopathies, site unspecified: Secondary | ICD-10-CM | POA: Diagnosis not present

## 2013-10-08 DIAGNOSIS — M5380 Other specified dorsopathies, site unspecified: Secondary | ICD-10-CM | POA: Diagnosis not present

## 2013-10-08 DIAGNOSIS — R293 Abnormal posture: Secondary | ICD-10-CM | POA: Diagnosis not present

## 2013-10-10 DIAGNOSIS — M5380 Other specified dorsopathies, site unspecified: Secondary | ICD-10-CM | POA: Diagnosis not present

## 2013-10-10 DIAGNOSIS — R293 Abnormal posture: Secondary | ICD-10-CM | POA: Diagnosis not present

## 2013-10-15 DIAGNOSIS — M5380 Other specified dorsopathies, site unspecified: Secondary | ICD-10-CM | POA: Diagnosis not present

## 2013-10-15 DIAGNOSIS — R293 Abnormal posture: Secondary | ICD-10-CM | POA: Diagnosis not present

## 2013-10-21 DIAGNOSIS — E291 Testicular hypofunction: Secondary | ICD-10-CM | POA: Diagnosis not present

## 2013-10-22 DIAGNOSIS — R293 Abnormal posture: Secondary | ICD-10-CM | POA: Diagnosis not present

## 2013-10-22 DIAGNOSIS — IMO0002 Reserved for concepts with insufficient information to code with codable children: Secondary | ICD-10-CM | POA: Diagnosis not present

## 2013-10-22 DIAGNOSIS — M5380 Other specified dorsopathies, site unspecified: Secondary | ICD-10-CM | POA: Diagnosis not present

## 2013-10-25 DIAGNOSIS — M5380 Other specified dorsopathies, site unspecified: Secondary | ICD-10-CM | POA: Diagnosis not present

## 2013-10-25 DIAGNOSIS — R293 Abnormal posture: Secondary | ICD-10-CM | POA: Diagnosis not present

## 2013-10-29 DIAGNOSIS — IMO0002 Reserved for concepts with insufficient information to code with codable children: Secondary | ICD-10-CM | POA: Diagnosis not present

## 2013-10-29 DIAGNOSIS — R293 Abnormal posture: Secondary | ICD-10-CM | POA: Diagnosis not present

## 2013-10-29 DIAGNOSIS — M5380 Other specified dorsopathies, site unspecified: Secondary | ICD-10-CM | POA: Diagnosis not present

## 2013-10-31 DIAGNOSIS — M5380 Other specified dorsopathies, site unspecified: Secondary | ICD-10-CM | POA: Diagnosis not present

## 2013-10-31 DIAGNOSIS — R293 Abnormal posture: Secondary | ICD-10-CM | POA: Diagnosis not present

## 2013-10-31 DIAGNOSIS — IMO0002 Reserved for concepts with insufficient information to code with codable children: Secondary | ICD-10-CM | POA: Diagnosis not present

## 2013-11-04 DIAGNOSIS — M5380 Other specified dorsopathies, site unspecified: Secondary | ICD-10-CM | POA: Diagnosis not present

## 2013-11-05 DIAGNOSIS — R293 Abnormal posture: Secondary | ICD-10-CM | POA: Diagnosis not present

## 2013-11-05 DIAGNOSIS — M5380 Other specified dorsopathies, site unspecified: Secondary | ICD-10-CM | POA: Diagnosis not present

## 2013-11-05 DIAGNOSIS — IMO0002 Reserved for concepts with insufficient information to code with codable children: Secondary | ICD-10-CM | POA: Diagnosis not present

## 2013-11-07 DIAGNOSIS — IMO0002 Reserved for concepts with insufficient information to code with codable children: Secondary | ICD-10-CM | POA: Diagnosis not present

## 2013-11-07 DIAGNOSIS — R293 Abnormal posture: Secondary | ICD-10-CM | POA: Diagnosis not present

## 2013-11-07 DIAGNOSIS — M5380 Other specified dorsopathies, site unspecified: Secondary | ICD-10-CM | POA: Diagnosis not present

## 2013-11-11 DIAGNOSIS — H4010X0 Unspecified open-angle glaucoma, stage unspecified: Secondary | ICD-10-CM | POA: Diagnosis not present

## 2013-11-12 DIAGNOSIS — IMO0002 Reserved for concepts with insufficient information to code with codable children: Secondary | ICD-10-CM | POA: Diagnosis not present

## 2013-11-12 DIAGNOSIS — R293 Abnormal posture: Secondary | ICD-10-CM | POA: Diagnosis not present

## 2013-11-12 DIAGNOSIS — M5380 Other specified dorsopathies, site unspecified: Secondary | ICD-10-CM | POA: Diagnosis not present

## 2013-11-14 DIAGNOSIS — M1610 Unilateral primary osteoarthritis, unspecified hip: Secondary | ICD-10-CM | POA: Diagnosis not present

## 2013-11-14 DIAGNOSIS — M48061 Spinal stenosis, lumbar region without neurogenic claudication: Secondary | ICD-10-CM | POA: Diagnosis not present

## 2013-11-19 DIAGNOSIS — IMO0002 Reserved for concepts with insufficient information to code with codable children: Secondary | ICD-10-CM | POA: Diagnosis not present

## 2013-11-19 DIAGNOSIS — F4323 Adjustment disorder with mixed anxiety and depressed mood: Secondary | ICD-10-CM | POA: Diagnosis not present

## 2013-11-19 DIAGNOSIS — R293 Abnormal posture: Secondary | ICD-10-CM | POA: Diagnosis not present

## 2013-11-19 DIAGNOSIS — M5380 Other specified dorsopathies, site unspecified: Secondary | ICD-10-CM | POA: Diagnosis not present

## 2013-11-22 DIAGNOSIS — R293 Abnormal posture: Secondary | ICD-10-CM | POA: Diagnosis not present

## 2013-11-22 DIAGNOSIS — M5380 Other specified dorsopathies, site unspecified: Secondary | ICD-10-CM | POA: Diagnosis not present

## 2013-11-22 DIAGNOSIS — IMO0002 Reserved for concepts with insufficient information to code with codable children: Secondary | ICD-10-CM | POA: Diagnosis not present

## 2013-11-26 DIAGNOSIS — IMO0002 Reserved for concepts with insufficient information to code with codable children: Secondary | ICD-10-CM | POA: Diagnosis not present

## 2013-11-26 DIAGNOSIS — M5380 Other specified dorsopathies, site unspecified: Secondary | ICD-10-CM | POA: Diagnosis not present

## 2013-11-26 DIAGNOSIS — R293 Abnormal posture: Secondary | ICD-10-CM | POA: Diagnosis not present

## 2013-11-28 DIAGNOSIS — R293 Abnormal posture: Secondary | ICD-10-CM | POA: Diagnosis not present

## 2013-11-28 DIAGNOSIS — M5380 Other specified dorsopathies, site unspecified: Secondary | ICD-10-CM | POA: Diagnosis not present

## 2013-11-29 DIAGNOSIS — N400 Enlarged prostate without lower urinary tract symptoms: Secondary | ICD-10-CM | POA: Diagnosis not present

## 2013-11-29 DIAGNOSIS — E291 Testicular hypofunction: Secondary | ICD-10-CM | POA: Diagnosis not present

## 2013-12-03 DIAGNOSIS — M5380 Other specified dorsopathies, site unspecified: Secondary | ICD-10-CM | POA: Diagnosis not present

## 2013-12-03 DIAGNOSIS — R293 Abnormal posture: Secondary | ICD-10-CM | POA: Diagnosis not present

## 2013-12-05 DIAGNOSIS — M5380 Other specified dorsopathies, site unspecified: Secondary | ICD-10-CM | POA: Diagnosis not present

## 2013-12-05 DIAGNOSIS — R293 Abnormal posture: Secondary | ICD-10-CM | POA: Diagnosis not present

## 2013-12-10 DIAGNOSIS — M5380 Other specified dorsopathies, site unspecified: Secondary | ICD-10-CM | POA: Diagnosis not present

## 2013-12-10 DIAGNOSIS — R293 Abnormal posture: Secondary | ICD-10-CM | POA: Diagnosis not present

## 2013-12-10 DIAGNOSIS — IMO0002 Reserved for concepts with insufficient information to code with codable children: Secondary | ICD-10-CM | POA: Diagnosis not present

## 2013-12-13 DIAGNOSIS — R293 Abnormal posture: Secondary | ICD-10-CM | POA: Diagnosis not present

## 2013-12-13 DIAGNOSIS — IMO0002 Reserved for concepts with insufficient information to code with codable children: Secondary | ICD-10-CM | POA: Diagnosis not present

## 2013-12-13 DIAGNOSIS — M5380 Other specified dorsopathies, site unspecified: Secondary | ICD-10-CM | POA: Diagnosis not present

## 2013-12-19 DIAGNOSIS — M6530 Trigger finger, unspecified finger: Secondary | ICD-10-CM | POA: Diagnosis not present

## 2013-12-23 DIAGNOSIS — IMO0002 Reserved for concepts with insufficient information to code with codable children: Secondary | ICD-10-CM | POA: Diagnosis not present

## 2013-12-23 DIAGNOSIS — R293 Abnormal posture: Secondary | ICD-10-CM | POA: Diagnosis not present

## 2013-12-23 DIAGNOSIS — M5380 Other specified dorsopathies, site unspecified: Secondary | ICD-10-CM | POA: Diagnosis not present

## 2013-12-27 DIAGNOSIS — Z23 Encounter for immunization: Secondary | ICD-10-CM | POA: Diagnosis not present

## 2013-12-27 DIAGNOSIS — L02519 Cutaneous abscess of unspecified hand: Secondary | ICD-10-CM | POA: Diagnosis not present

## 2013-12-27 DIAGNOSIS — M76899 Other specified enthesopathies of unspecified lower limb, excluding foot: Secondary | ICD-10-CM | POA: Diagnosis not present

## 2013-12-27 DIAGNOSIS — M25559 Pain in unspecified hip: Secondary | ICD-10-CM | POA: Diagnosis not present

## 2013-12-27 DIAGNOSIS — L03119 Cellulitis of unspecified part of limb: Secondary | ICD-10-CM | POA: Diagnosis not present

## 2014-01-01 DIAGNOSIS — Z23 Encounter for immunization: Secondary | ICD-10-CM | POA: Diagnosis not present

## 2014-01-01 DIAGNOSIS — L03119 Cellulitis of unspecified part of limb: Secondary | ICD-10-CM | POA: Diagnosis not present

## 2014-01-01 DIAGNOSIS — M25559 Pain in unspecified hip: Secondary | ICD-10-CM | POA: Diagnosis not present

## 2014-01-01 DIAGNOSIS — L570 Actinic keratosis: Secondary | ICD-10-CM | POA: Diagnosis not present

## 2014-01-01 DIAGNOSIS — L02519 Cutaneous abscess of unspecified hand: Secondary | ICD-10-CM | POA: Diagnosis not present

## 2014-01-01 DIAGNOSIS — E291 Testicular hypofunction: Secondary | ICD-10-CM | POA: Diagnosis not present

## 2014-01-22 DIAGNOSIS — T148 Other injury of unspecified body region: Secondary | ICD-10-CM | POA: Diagnosis not present

## 2014-01-22 DIAGNOSIS — E785 Hyperlipidemia, unspecified: Secondary | ICD-10-CM | POA: Diagnosis not present

## 2014-01-22 DIAGNOSIS — I10 Essential (primary) hypertension: Secondary | ICD-10-CM | POA: Diagnosis not present

## 2014-01-22 DIAGNOSIS — Z23 Encounter for immunization: Secondary | ICD-10-CM | POA: Diagnosis not present

## 2014-01-23 DIAGNOSIS — M5137 Other intervertebral disc degeneration, lumbosacral region: Secondary | ICD-10-CM | POA: Diagnosis not present

## 2014-01-23 DIAGNOSIS — M1610 Unilateral primary osteoarthritis, unspecified hip: Secondary | ICD-10-CM | POA: Diagnosis not present

## 2014-01-29 DIAGNOSIS — E291 Testicular hypofunction: Secondary | ICD-10-CM | POA: Diagnosis not present

## 2014-02-11 DIAGNOSIS — E785 Hyperlipidemia, unspecified: Secondary | ICD-10-CM | POA: Diagnosis not present

## 2014-02-11 DIAGNOSIS — I10 Essential (primary) hypertension: Secondary | ICD-10-CM | POA: Diagnosis not present

## 2014-02-11 LAB — BASIC METABOLIC PANEL WITH GFR
BUN: 13 mg/dL (ref 4–21)
Creatinine: 1.3 mg/dL (ref 0.6–1.3)
Glucose: 98 mg/dL
Potassium: 4.6 mmol/L (ref 3.4–5.3)
Sodium: 141 mmol/L (ref 137–147)

## 2014-02-11 LAB — LIPID PANEL
Cholesterol: 128 mg/dL (ref 0–200)
HDL: 60 mg/dL (ref 35–70)
LDL CALC: 51 mg/dL
TRIGLYCERIDES: 85 mg/dL (ref 40–160)

## 2014-02-11 LAB — HEPATIC FUNCTION PANEL: ALT: 16 U/L (ref 10–40)

## 2014-02-12 DIAGNOSIS — L821 Other seborrheic keratosis: Secondary | ICD-10-CM | POA: Diagnosis not present

## 2014-02-12 DIAGNOSIS — D485 Neoplasm of uncertain behavior of skin: Secondary | ICD-10-CM | POA: Diagnosis not present

## 2014-02-12 DIAGNOSIS — L570 Actinic keratosis: Secondary | ICD-10-CM | POA: Diagnosis not present

## 2014-02-12 DIAGNOSIS — C44119 Basal cell carcinoma of skin of left eyelid, including canthus: Secondary | ICD-10-CM | POA: Diagnosis not present

## 2014-02-19 DIAGNOSIS — I10 Essential (primary) hypertension: Secondary | ICD-10-CM | POA: Diagnosis not present

## 2014-02-19 DIAGNOSIS — I2510 Atherosclerotic heart disease of native coronary artery without angina pectoris: Secondary | ICD-10-CM | POA: Diagnosis not present

## 2014-02-19 DIAGNOSIS — E785 Hyperlipidemia, unspecified: Secondary | ICD-10-CM | POA: Diagnosis not present

## 2014-02-26 DIAGNOSIS — E291 Testicular hypofunction: Secondary | ICD-10-CM | POA: Diagnosis not present

## 2014-03-06 DIAGNOSIS — C44311 Basal cell carcinoma of skin of nose: Secondary | ICD-10-CM | POA: Diagnosis not present

## 2014-03-06 DIAGNOSIS — L814 Other melanin hyperpigmentation: Secondary | ICD-10-CM | POA: Diagnosis not present

## 2014-03-06 DIAGNOSIS — L578 Other skin changes due to chronic exposure to nonionizing radiation: Secondary | ICD-10-CM | POA: Diagnosis not present

## 2014-03-06 DIAGNOSIS — L908 Other atrophic disorders of skin: Secondary | ICD-10-CM | POA: Diagnosis not present

## 2014-04-09 DIAGNOSIS — E291 Testicular hypofunction: Secondary | ICD-10-CM | POA: Diagnosis not present

## 2014-04-21 DIAGNOSIS — M65352 Trigger finger, left little finger: Secondary | ICD-10-CM | POA: Diagnosis not present

## 2014-05-06 DIAGNOSIS — M65352 Trigger finger, left little finger: Secondary | ICD-10-CM | POA: Diagnosis not present

## 2014-05-12 DIAGNOSIS — E291 Testicular hypofunction: Secondary | ICD-10-CM | POA: Diagnosis not present

## 2014-05-14 DIAGNOSIS — C44311 Basal cell carcinoma of skin of nose: Secondary | ICD-10-CM | POA: Diagnosis not present

## 2014-05-28 ENCOUNTER — Ambulatory Visit: Payer: Self-pay | Admitting: Family Medicine

## 2014-05-28 DIAGNOSIS — M19011 Primary osteoarthritis, right shoulder: Secondary | ICD-10-CM | POA: Diagnosis not present

## 2014-05-28 DIAGNOSIS — M25519 Pain in unspecified shoulder: Secondary | ICD-10-CM | POA: Diagnosis not present

## 2014-05-28 DIAGNOSIS — M25512 Pain in left shoulder: Secondary | ICD-10-CM | POA: Diagnosis not present

## 2014-05-28 DIAGNOSIS — M25511 Pain in right shoulder: Secondary | ICD-10-CM | POA: Diagnosis not present

## 2014-05-28 DIAGNOSIS — M19012 Primary osteoarthritis, left shoulder: Secondary | ICD-10-CM | POA: Diagnosis not present

## 2014-06-05 DIAGNOSIS — M25511 Pain in right shoulder: Secondary | ICD-10-CM | POA: Diagnosis not present

## 2014-06-05 DIAGNOSIS — M25512 Pain in left shoulder: Secondary | ICD-10-CM | POA: Diagnosis not present

## 2014-06-05 DIAGNOSIS — M25612 Stiffness of left shoulder, not elsewhere classified: Secondary | ICD-10-CM | POA: Diagnosis not present

## 2014-06-05 DIAGNOSIS — M25611 Stiffness of right shoulder, not elsewhere classified: Secondary | ICD-10-CM | POA: Diagnosis not present

## 2014-06-09 DIAGNOSIS — M25612 Stiffness of left shoulder, not elsewhere classified: Secondary | ICD-10-CM | POA: Diagnosis not present

## 2014-06-09 DIAGNOSIS — E291 Testicular hypofunction: Secondary | ICD-10-CM | POA: Diagnosis not present

## 2014-06-09 DIAGNOSIS — M25511 Pain in right shoulder: Secondary | ICD-10-CM | POA: Diagnosis not present

## 2014-06-09 DIAGNOSIS — M25512 Pain in left shoulder: Secondary | ICD-10-CM | POA: Diagnosis not present

## 2014-06-09 DIAGNOSIS — M25611 Stiffness of right shoulder, not elsewhere classified: Secondary | ICD-10-CM | POA: Diagnosis not present

## 2014-06-11 DIAGNOSIS — M25511 Pain in right shoulder: Secondary | ICD-10-CM | POA: Diagnosis not present

## 2014-06-11 DIAGNOSIS — M25512 Pain in left shoulder: Secondary | ICD-10-CM | POA: Diagnosis not present

## 2014-06-11 DIAGNOSIS — M25611 Stiffness of right shoulder, not elsewhere classified: Secondary | ICD-10-CM | POA: Diagnosis not present

## 2014-06-11 DIAGNOSIS — M25612 Stiffness of left shoulder, not elsewhere classified: Secondary | ICD-10-CM | POA: Diagnosis not present

## 2014-06-17 DIAGNOSIS — F4323 Adjustment disorder with mixed anxiety and depressed mood: Secondary | ICD-10-CM | POA: Diagnosis not present

## 2014-06-19 DIAGNOSIS — M25511 Pain in right shoulder: Secondary | ICD-10-CM | POA: Diagnosis not present

## 2014-06-19 DIAGNOSIS — M25512 Pain in left shoulder: Secondary | ICD-10-CM | POA: Diagnosis not present

## 2014-06-19 DIAGNOSIS — M25611 Stiffness of right shoulder, not elsewhere classified: Secondary | ICD-10-CM | POA: Diagnosis not present

## 2014-06-19 DIAGNOSIS — M25612 Stiffness of left shoulder, not elsewhere classified: Secondary | ICD-10-CM | POA: Diagnosis not present

## 2014-06-23 DIAGNOSIS — M25511 Pain in right shoulder: Secondary | ICD-10-CM | POA: Diagnosis not present

## 2014-06-23 DIAGNOSIS — M25512 Pain in left shoulder: Secondary | ICD-10-CM | POA: Diagnosis not present

## 2014-06-23 DIAGNOSIS — M25611 Stiffness of right shoulder, not elsewhere classified: Secondary | ICD-10-CM | POA: Diagnosis not present

## 2014-06-23 DIAGNOSIS — M25612 Stiffness of left shoulder, not elsewhere classified: Secondary | ICD-10-CM | POA: Diagnosis not present

## 2014-07-02 DIAGNOSIS — M25512 Pain in left shoulder: Secondary | ICD-10-CM | POA: Diagnosis not present

## 2014-07-02 DIAGNOSIS — M25511 Pain in right shoulder: Secondary | ICD-10-CM | POA: Diagnosis not present

## 2014-07-02 DIAGNOSIS — M25611 Stiffness of right shoulder, not elsewhere classified: Secondary | ICD-10-CM | POA: Diagnosis not present

## 2014-07-02 DIAGNOSIS — M25612 Stiffness of left shoulder, not elsewhere classified: Secondary | ICD-10-CM | POA: Diagnosis not present

## 2014-07-04 DIAGNOSIS — M25612 Stiffness of left shoulder, not elsewhere classified: Secondary | ICD-10-CM | POA: Diagnosis not present

## 2014-07-04 DIAGNOSIS — R293 Abnormal posture: Secondary | ICD-10-CM | POA: Diagnosis not present

## 2014-07-04 DIAGNOSIS — M19019 Primary osteoarthritis, unspecified shoulder: Secondary | ICD-10-CM | POA: Diagnosis not present

## 2014-07-04 DIAGNOSIS — M25611 Stiffness of right shoulder, not elsewhere classified: Secondary | ICD-10-CM | POA: Diagnosis not present

## 2014-07-04 DIAGNOSIS — M25511 Pain in right shoulder: Secondary | ICD-10-CM | POA: Diagnosis not present

## 2014-07-04 DIAGNOSIS — M25619 Stiffness of unspecified shoulder, not elsewhere classified: Secondary | ICD-10-CM | POA: Diagnosis not present

## 2014-07-04 DIAGNOSIS — M25512 Pain in left shoulder: Secondary | ICD-10-CM | POA: Diagnosis not present

## 2014-07-07 DIAGNOSIS — E291 Testicular hypofunction: Secondary | ICD-10-CM | POA: Diagnosis not present

## 2014-07-09 DIAGNOSIS — M25612 Stiffness of left shoulder, not elsewhere classified: Secondary | ICD-10-CM | POA: Diagnosis not present

## 2014-07-09 DIAGNOSIS — M25611 Stiffness of right shoulder, not elsewhere classified: Secondary | ICD-10-CM | POA: Diagnosis not present

## 2014-07-09 DIAGNOSIS — M25512 Pain in left shoulder: Secondary | ICD-10-CM | POA: Diagnosis not present

## 2014-07-09 DIAGNOSIS — M25511 Pain in right shoulder: Secondary | ICD-10-CM | POA: Diagnosis not present

## 2014-07-11 DIAGNOSIS — M25512 Pain in left shoulder: Secondary | ICD-10-CM | POA: Diagnosis not present

## 2014-07-11 DIAGNOSIS — M25612 Stiffness of left shoulder, not elsewhere classified: Secondary | ICD-10-CM | POA: Diagnosis not present

## 2014-07-11 DIAGNOSIS — M25611 Stiffness of right shoulder, not elsewhere classified: Secondary | ICD-10-CM | POA: Diagnosis not present

## 2014-07-11 DIAGNOSIS — M25511 Pain in right shoulder: Secondary | ICD-10-CM | POA: Diagnosis not present

## 2014-07-15 DIAGNOSIS — F4323 Adjustment disorder with mixed anxiety and depressed mood: Secondary | ICD-10-CM | POA: Diagnosis not present

## 2014-07-16 DIAGNOSIS — M25511 Pain in right shoulder: Secondary | ICD-10-CM | POA: Diagnosis not present

## 2014-07-16 DIAGNOSIS — M25612 Stiffness of left shoulder, not elsewhere classified: Secondary | ICD-10-CM | POA: Diagnosis not present

## 2014-07-16 DIAGNOSIS — M25512 Pain in left shoulder: Secondary | ICD-10-CM | POA: Diagnosis not present

## 2014-07-16 DIAGNOSIS — M25611 Stiffness of right shoulder, not elsewhere classified: Secondary | ICD-10-CM | POA: Diagnosis not present

## 2014-07-18 DIAGNOSIS — M25612 Stiffness of left shoulder, not elsewhere classified: Secondary | ICD-10-CM | POA: Diagnosis not present

## 2014-07-18 DIAGNOSIS — M25511 Pain in right shoulder: Secondary | ICD-10-CM | POA: Diagnosis not present

## 2014-07-18 DIAGNOSIS — M25611 Stiffness of right shoulder, not elsewhere classified: Secondary | ICD-10-CM | POA: Diagnosis not present

## 2014-07-18 DIAGNOSIS — M25512 Pain in left shoulder: Secondary | ICD-10-CM | POA: Diagnosis not present

## 2014-07-23 DIAGNOSIS — M25511 Pain in right shoulder: Secondary | ICD-10-CM | POA: Diagnosis not present

## 2014-07-23 DIAGNOSIS — M25612 Stiffness of left shoulder, not elsewhere classified: Secondary | ICD-10-CM | POA: Diagnosis not present

## 2014-07-23 DIAGNOSIS — M25512 Pain in left shoulder: Secondary | ICD-10-CM | POA: Diagnosis not present

## 2014-07-23 DIAGNOSIS — M25611 Stiffness of right shoulder, not elsewhere classified: Secondary | ICD-10-CM | POA: Diagnosis not present

## 2014-07-24 DIAGNOSIS — H40003 Preglaucoma, unspecified, bilateral: Secondary | ICD-10-CM | POA: Diagnosis not present

## 2014-07-25 DIAGNOSIS — M25512 Pain in left shoulder: Secondary | ICD-10-CM | POA: Diagnosis not present

## 2014-07-25 DIAGNOSIS — M25511 Pain in right shoulder: Secondary | ICD-10-CM | POA: Diagnosis not present

## 2014-07-25 DIAGNOSIS — M25611 Stiffness of right shoulder, not elsewhere classified: Secondary | ICD-10-CM | POA: Diagnosis not present

## 2014-07-25 DIAGNOSIS — M25612 Stiffness of left shoulder, not elsewhere classified: Secondary | ICD-10-CM | POA: Diagnosis not present

## 2014-08-04 DIAGNOSIS — E291 Testicular hypofunction: Secondary | ICD-10-CM | POA: Diagnosis not present

## 2014-08-05 DIAGNOSIS — M25612 Stiffness of left shoulder, not elsewhere classified: Secondary | ICD-10-CM | POA: Diagnosis not present

## 2014-08-05 DIAGNOSIS — M25512 Pain in left shoulder: Secondary | ICD-10-CM | POA: Diagnosis not present

## 2014-08-05 DIAGNOSIS — M25611 Stiffness of right shoulder, not elsewhere classified: Secondary | ICD-10-CM | POA: Diagnosis not present

## 2014-08-05 DIAGNOSIS — M25511 Pain in right shoulder: Secondary | ICD-10-CM | POA: Diagnosis not present

## 2014-08-07 DIAGNOSIS — M25612 Stiffness of left shoulder, not elsewhere classified: Secondary | ICD-10-CM | POA: Diagnosis not present

## 2014-08-07 DIAGNOSIS — M25511 Pain in right shoulder: Secondary | ICD-10-CM | POA: Diagnosis not present

## 2014-08-07 DIAGNOSIS — M25611 Stiffness of right shoulder, not elsewhere classified: Secondary | ICD-10-CM | POA: Diagnosis not present

## 2014-08-07 DIAGNOSIS — M25512 Pain in left shoulder: Secondary | ICD-10-CM | POA: Diagnosis not present

## 2014-08-12 DIAGNOSIS — M25512 Pain in left shoulder: Secondary | ICD-10-CM | POA: Diagnosis not present

## 2014-08-12 DIAGNOSIS — M25611 Stiffness of right shoulder, not elsewhere classified: Secondary | ICD-10-CM | POA: Diagnosis not present

## 2014-08-12 DIAGNOSIS — M25612 Stiffness of left shoulder, not elsewhere classified: Secondary | ICD-10-CM | POA: Diagnosis not present

## 2014-08-12 DIAGNOSIS — M25511 Pain in right shoulder: Secondary | ICD-10-CM | POA: Diagnosis not present

## 2014-08-14 DIAGNOSIS — M25611 Stiffness of right shoulder, not elsewhere classified: Secondary | ICD-10-CM | POA: Diagnosis not present

## 2014-08-14 DIAGNOSIS — M25511 Pain in right shoulder: Secondary | ICD-10-CM | POA: Diagnosis not present

## 2014-08-14 DIAGNOSIS — M25612 Stiffness of left shoulder, not elsewhere classified: Secondary | ICD-10-CM | POA: Diagnosis not present

## 2014-08-14 DIAGNOSIS — M25512 Pain in left shoulder: Secondary | ICD-10-CM | POA: Diagnosis not present

## 2014-08-15 DIAGNOSIS — L570 Actinic keratosis: Secondary | ICD-10-CM | POA: Diagnosis not present

## 2014-08-15 DIAGNOSIS — Z85828 Personal history of other malignant neoplasm of skin: Secondary | ICD-10-CM | POA: Diagnosis not present

## 2014-08-15 DIAGNOSIS — L820 Inflamed seborrheic keratosis: Secondary | ICD-10-CM | POA: Diagnosis not present

## 2014-08-18 DIAGNOSIS — M25511 Pain in right shoulder: Secondary | ICD-10-CM | POA: Diagnosis not present

## 2014-08-18 DIAGNOSIS — M25611 Stiffness of right shoulder, not elsewhere classified: Secondary | ICD-10-CM | POA: Diagnosis not present

## 2014-08-18 DIAGNOSIS — M25612 Stiffness of left shoulder, not elsewhere classified: Secondary | ICD-10-CM | POA: Diagnosis not present

## 2014-08-18 DIAGNOSIS — M25512 Pain in left shoulder: Secondary | ICD-10-CM | POA: Diagnosis not present

## 2014-08-21 DIAGNOSIS — M25511 Pain in right shoulder: Secondary | ICD-10-CM | POA: Diagnosis not present

## 2014-08-21 DIAGNOSIS — M25612 Stiffness of left shoulder, not elsewhere classified: Secondary | ICD-10-CM | POA: Diagnosis not present

## 2014-08-21 DIAGNOSIS — M25611 Stiffness of right shoulder, not elsewhere classified: Secondary | ICD-10-CM | POA: Diagnosis not present

## 2014-08-21 DIAGNOSIS — M25512 Pain in left shoulder: Secondary | ICD-10-CM | POA: Diagnosis not present

## 2014-08-24 NOTE — Op Note (Signed)
PATIENT NAME:  Ivan Salazar, Ivan Salazar MR#:  [id number] DATE OF BIRTH:  [birth year]  DATE OF PROCEDURE:  05/18/2011  PREOPERATIVE DIAGNOSIS: Bilateral carpal tunnel syndrome.   POSTOPERATIVE DIAGNOSIS: Bilateral carpal tunnel syndrome.   OPERATION:  1. Right carpal tunnel release. 2. Betamethasone injection left carpal tunnel.   SURGEON: Park Breed, M.D.   ANESTHESIA: General LMA.   COMPLICATIONS: None.   DRAINS: None.   DESCRIPTION OF PROCEDURE: The patient was brought to the operating room where he underwent satisfactory general LMA anesthesia in the supine position. The right arm was prepped and draped in sterile fashion. Esmarch was applied and the tourniquet inflated to 250 mmHg. Tourniquet time was 20 minutes. A longitudinal incision was made in the palm just to the ulnar side of midline. Dissection was carried out carefully under loupe magnification. Blunt dissection was carried out subcutaneously. The palmar fascia was divided, exposing the ulnar artery. Careful dissection medially showed that the motor branch of the median nerve came off quite volarly and was visualized and protected completely. The volar aspect of the carpal ligament was released from soft tissue and a Kelly clamp passed beneath the ligament. A miniblade knife was used to release the ligament in its distal 75%. The proximal quarter was released with carpal tunnel scissors under direct vision. The nerve was somewhat flattened and the ligament was quite thickened. The mosquito clamp was used to free the nerve up from adhesions. The motor branch was examined and was again noted to be normal. Guyon's canal was opened under direct vision using a Kelly clamp for protection and miniblade knife. The distal palmar arch was identified. The ulnar artery was tortuous. The wound was then irrigated and skin closed with a running 4-0 nylon suture. 0.5% Marcaine was placed in the wound. A dry sterile compression hand dressing with volar  splint was applied. The tourniquet was deflated with good return of blood flow to the hand. The patient was awakened. The left wrist was then prepped sterilely with alcohol and 1 mL of betamethasone was injected into the carpal tunnel. Band-Aid was applied. The patient was transferred to a stretcher and taken to recovery in good condition.    ____________________________ Park Breed, MD hem:bjt D: 05/18/2011 13:30:40 ET T: 05/18/2011 13:47:42 ET JOB#: [id number]  cc: Park Breed, MD, <Dictator> Park Breed MD ELECTRONICALLY SIGNED 05/18/2011 15:27

## 2014-08-24 NOTE — Op Note (Signed)
PATIENT NAME:  Ivan Salazar, Ivan Salazar MR#:  [id number] DATE OF BIRTH:  Oct 23, 1931  DATE OF PROCEDURE:  06/01/2011  PREOPERATIVE DIAGNOSIS: Left carpal tunnel syndrome.   POSTOPERATIVE DIAGNOSIS: Left carpal tunnel syndrome.   OPERATION: Left carpal tunnel release.   SURGEON: Park Breed, MD    ANESTHESIA: General LMA.   COMPLICATIONS: None.   DRAINS: None.   DESCRIPTION OF PROCEDURE: The patient was brought to the operating room where he underwent satisfactory general LMA anesthesia in the supine position. The left arm was prepped and draped in sterile fashion. An Esmarch was applied. The tourniquet was inflated to 250 mmHg. Tourniquet time was 14 minutes. A longitudinal incision was made in the palm just to the ulnar side of midline. Dissection was carried out bluntly through subcutaneous tissue releasing fascia. The distal aspect of the volar carpal ligament was identified and a Kelly clamp passed beneath it for protection. The volar carpal ligament was divided with a mini blade knife through its distal 80%. The proximal 20% was then released under direct vision with carpal tunnel scissors. The median nerve was pale and flattened. It was freed up from adhesions with a mosquito clamp. The motor branch was inspected and was intact. Guyon's canal was then released in a similar fashion. The wound was irrigated and closed with running 4-0 nylon suture. 0.5% Marcaine was placed in the wound and a dry sterile compression hand dressing with volar splint was applied. The tourniquet was deflated with good return of blood flow to the hand. The patient was awakened and taken to recovery in good condition.   ____________________________ Park Breed, MD hem:drc D: 06/01/2011 13:32:28 ET T: 06/01/2011 14:16:54 ET JOB#: [id number] cc: Park Breed, MD, <Dictator> Park Breed MD ELECTRONICALLY SIGNED 06/02/2011 9:55

## 2014-08-27 DIAGNOSIS — M25512 Pain in left shoulder: Secondary | ICD-10-CM | POA: Diagnosis not present

## 2014-08-27 DIAGNOSIS — M25511 Pain in right shoulder: Secondary | ICD-10-CM | POA: Diagnosis not present

## 2014-08-27 DIAGNOSIS — M25612 Stiffness of left shoulder, not elsewhere classified: Secondary | ICD-10-CM | POA: Diagnosis not present

## 2014-08-27 DIAGNOSIS — M25611 Stiffness of right shoulder, not elsewhere classified: Secondary | ICD-10-CM | POA: Diagnosis not present

## 2014-08-29 DIAGNOSIS — M25511 Pain in right shoulder: Secondary | ICD-10-CM | POA: Diagnosis not present

## 2014-08-29 DIAGNOSIS — M25611 Stiffness of right shoulder, not elsewhere classified: Secondary | ICD-10-CM | POA: Diagnosis not present

## 2014-08-29 DIAGNOSIS — M25612 Stiffness of left shoulder, not elsewhere classified: Secondary | ICD-10-CM | POA: Diagnosis not present

## 2014-08-29 DIAGNOSIS — M25512 Pain in left shoulder: Secondary | ICD-10-CM | POA: Diagnosis not present

## 2014-09-02 DIAGNOSIS — M25612 Stiffness of left shoulder, not elsewhere classified: Secondary | ICD-10-CM | POA: Diagnosis not present

## 2014-09-02 DIAGNOSIS — M25611 Stiffness of right shoulder, not elsewhere classified: Secondary | ICD-10-CM | POA: Diagnosis not present

## 2014-09-02 DIAGNOSIS — M25512 Pain in left shoulder: Secondary | ICD-10-CM | POA: Diagnosis not present

## 2014-09-02 DIAGNOSIS — M25511 Pain in right shoulder: Secondary | ICD-10-CM | POA: Diagnosis not present

## 2014-09-02 DIAGNOSIS — F4323 Adjustment disorder with mixed anxiety and depressed mood: Secondary | ICD-10-CM | POA: Diagnosis not present

## 2014-09-04 DIAGNOSIS — M25612 Stiffness of left shoulder, not elsewhere classified: Secondary | ICD-10-CM | POA: Diagnosis not present

## 2014-09-04 DIAGNOSIS — M25511 Pain in right shoulder: Secondary | ICD-10-CM | POA: Diagnosis not present

## 2014-09-04 DIAGNOSIS — M25512 Pain in left shoulder: Secondary | ICD-10-CM | POA: Diagnosis not present

## 2014-09-04 DIAGNOSIS — M25611 Stiffness of right shoulder, not elsewhere classified: Secondary | ICD-10-CM | POA: Diagnosis not present

## 2014-09-09 DIAGNOSIS — M25512 Pain in left shoulder: Secondary | ICD-10-CM | POA: Diagnosis not present

## 2014-09-09 DIAGNOSIS — M25611 Stiffness of right shoulder, not elsewhere classified: Secondary | ICD-10-CM | POA: Diagnosis not present

## 2014-09-09 DIAGNOSIS — M25612 Stiffness of left shoulder, not elsewhere classified: Secondary | ICD-10-CM | POA: Diagnosis not present

## 2014-09-09 DIAGNOSIS — M25511 Pain in right shoulder: Secondary | ICD-10-CM | POA: Diagnosis not present

## 2014-09-11 DIAGNOSIS — M25511 Pain in right shoulder: Secondary | ICD-10-CM | POA: Diagnosis not present

## 2014-09-11 DIAGNOSIS — M25512 Pain in left shoulder: Secondary | ICD-10-CM | POA: Diagnosis not present

## 2014-09-11 DIAGNOSIS — M25612 Stiffness of left shoulder, not elsewhere classified: Secondary | ICD-10-CM | POA: Diagnosis not present

## 2014-09-11 DIAGNOSIS — M25611 Stiffness of right shoulder, not elsewhere classified: Secondary | ICD-10-CM | POA: Diagnosis not present

## 2014-10-08 ENCOUNTER — Other Ambulatory Visit: Payer: Self-pay | Admitting: Family Medicine

## 2014-10-08 DIAGNOSIS — M5137 Other intervertebral disc degeneration, lumbosacral region: Secondary | ICD-10-CM

## 2014-10-08 NOTE — Telephone Encounter (Signed)
Pt contacted office for refill request on the following medications: Hydrocodone 7.5-325 mg. Pt request 3 months worth of RX. Thanks TNP

## 2014-10-09 ENCOUNTER — Other Ambulatory Visit: Payer: Medicare Other

## 2014-10-10 ENCOUNTER — Other Ambulatory Visit: Payer: Self-pay

## 2014-10-10 ENCOUNTER — Other Ambulatory Visit: Payer: Medicare Other

## 2014-10-10 DIAGNOSIS — E291 Testicular hypofunction: Secondary | ICD-10-CM | POA: Diagnosis not present

## 2014-10-10 MED ORDER — HYDROCODONE-ACETAMINOPHEN 7.5-325 MG PO TABS
ORAL_TABLET | ORAL | Status: DC
Start: 2014-10-10 — End: 2014-10-10

## 2014-10-10 MED ORDER — HYDROCODONE-ACETAMINOPHEN 7.5-325 MG PO TABS: ORAL_TABLET | ORAL | Status: DC

## 2014-10-10 NOTE — Progress Notes (Signed)
Pt came in for testosterone to be drawn before 10:00a.m. Pt tolerated well. No s/s of adverse reaction noted. Cw,lpn

## 2014-10-11 LAB — TESTOSTERONE: TESTOSTERONE: 10 ng/dL — AB (ref 348–1197)

## 2014-10-13 ENCOUNTER — Telehealth: Payer: Self-pay | Admitting: *Deleted

## 2014-10-13 NOTE — Telephone Encounter (Signed)
I spoke w/Mr Annamaria Boots; he would like to schedule his injections every 3 weeks for now and see how his next labs look.

## 2014-10-13 NOTE — Telephone Encounter (Signed)
-----   Message from Nori Riis, PA-C sent at 10/13/2014  2:21 PM EDT ----- Patient's testosterone is very low, he may want to consider increasing the frequency of his injections.  Our records state that he receives an injection every five weeks.

## 2014-10-16 ENCOUNTER — Ambulatory Visit (INDEPENDENT_AMBULATORY_CARE_PROVIDER_SITE_OTHER): Payer: Medicare Other

## 2014-10-16 DIAGNOSIS — E291 Testicular hypofunction: Secondary | ICD-10-CM | POA: Diagnosis not present

## 2014-10-16 MED ORDER — TESTOSTERONE CYPIONATE 200 MG/ML IM SOLN
200.0000 mg | Freq: Once | INTRAMUSCULAR | Status: AC
  Administered 2014-10-16: 200 mg via INTRAMUSCULAR

## 2014-10-16 NOTE — Progress Notes (Signed)
Testosterone IM Injection  Due to Hypogonadism patient is present today for a Testosterone Injection.  Medication: Testosterone Cypionate Dose: 200mg  Location: right upper outer buttocks Lot: [id number] Exp:08/2015  Patient tolerated well, no complications were noted  Preformed by: Toniann Fail, LPN   Follow up: Pt will return in 3 weeks for next injection

## 2014-10-21 DIAGNOSIS — F4323 Adjustment disorder with mixed anxiety and depressed mood: Secondary | ICD-10-CM | POA: Diagnosis not present

## 2014-10-22 DIAGNOSIS — M5137 Other intervertebral disc degeneration, lumbosacral region: Secondary | ICD-10-CM | POA: Insufficient documentation

## 2014-10-22 DIAGNOSIS — K219 Gastro-esophageal reflux disease without esophagitis: Secondary | ICD-10-CM | POA: Insufficient documentation

## 2014-10-22 DIAGNOSIS — L570 Actinic keratosis: Secondary | ICD-10-CM | POA: Insufficient documentation

## 2014-10-22 DIAGNOSIS — M51379 Other intervertebral disc degeneration, lumbosacral region without mention of lumbar back pain or lower extremity pain: Secondary | ICD-10-CM | POA: Insufficient documentation

## 2014-10-23 ENCOUNTER — Encounter: Payer: Self-pay | Admitting: Family Medicine

## 2014-10-23 ENCOUNTER — Ambulatory Visit (INDEPENDENT_AMBULATORY_CARE_PROVIDER_SITE_OTHER): Payer: Medicare Other | Admitting: Family Medicine

## 2014-10-23 VITALS — BP 112/62 | HR 64 | Temp 98.0°F | Resp 16 | Ht 69.0 in | Wt 171.0 lb

## 2014-10-23 DIAGNOSIS — L820 Inflamed seborrheic keratosis: Secondary | ICD-10-CM

## 2014-10-23 NOTE — Patient Instructions (Signed)
Usually the skin lesions will come off within a week after freezing.

## 2014-10-23 NOTE — Progress Notes (Signed)
Subjective:     Patient ID: Ivan Salazar, male   DOB: August 11, 1931, 81 y.o.   MRN: [id number]  HPI  Chief Complaint  Patient presents with  . Rash    right side of neck x 4 weeks.  States it originally became irritated after wearing a collared shirt at a wedding for two days. Has ongoing f/u with a dermatologist in [place].   Review of Systems  Skin:       Reports prior lesions have been frozen as well as MOHS surgery around his eye.       Objective:   Physical Exam  Constitutional: He appears well-developed and well-nourished. No distress.  skin: multiple Seborrheic keratoses noted on neck and upper back with 2 that appear to be irritated/excoriated at his right neck line.     Assessment:    Seborrheic keratoses, inflamed - Plan: Cryotherapy/destruction benign or premalignant lesion      Plan:    Cryotherapy x 30 seconds to 3 seborrheic keratoses on his neck.

## 2014-10-26 ENCOUNTER — Other Ambulatory Visit: Payer: Self-pay | Admitting: Family Medicine

## 2014-11-06 ENCOUNTER — Ambulatory Visit (INDEPENDENT_AMBULATORY_CARE_PROVIDER_SITE_OTHER): Payer: Medicare Other

## 2014-11-06 ENCOUNTER — Telehealth: Payer: Self-pay | Admitting: Family Medicine

## 2014-11-06 DIAGNOSIS — E291 Testicular hypofunction: Secondary | ICD-10-CM

## 2014-11-06 MED ORDER — TESTOSTERONE CYPIONATE 200 MG/ML IM SOLN
200.0000 mg | Freq: Once | INTRAMUSCULAR | Status: AC
  Administered 2014-11-06: 200 mg via INTRAMUSCULAR

## 2014-11-06 NOTE — Telephone Encounter (Signed)
Pt would like you to call him  With a name of a local dermatologist.   [phone].

## 2014-11-06 NOTE — Progress Notes (Signed)
Testosterone IM Injection  Due to Hypogonadism patient is present today for a Testosterone Injection.  Medication: Testosterone Cypionate Dose: 200mg  Location: left upper outer buttocks Lot: [id number] Exp:08/2015  Patient tolerated well, no complications were noted  Preformed by: Toniann Fail, LPN   Follow up: 3 weeks

## 2014-11-06 NOTE — Telephone Encounter (Signed)
Recommend Dr. Kellie Moor or Dr. Evorn Gong.

## 2014-11-06 NOTE — Telephone Encounter (Signed)
Patient advised. I also gave pt the phone number and address for these providers .  [facility]  [phone] [place] [address]

## 2014-11-12 DIAGNOSIS — L820 Inflamed seborrheic keratosis: Secondary | ICD-10-CM | POA: Diagnosis not present

## 2014-11-12 DIAGNOSIS — L739 Follicular disorder, unspecified: Secondary | ICD-10-CM | POA: Diagnosis not present

## 2014-11-17 ENCOUNTER — Other Ambulatory Visit: Payer: Self-pay | Admitting: Family Medicine

## 2014-11-17 MED ORDER — METOPROLOL TARTRATE 25 MG PO TABS: 25.0000 mg | ORAL_TABLET | Freq: Two times a day (BID) | ORAL | Status: DC

## 2014-11-17 NOTE — Telephone Encounter (Signed)
metoprolol tartrate (LOPRESSOR) 25 MG tablet Refill to [facility]  90 days Thanks TP

## 2014-11-17 NOTE — Telephone Encounter (Signed)
Refill request for Metoprolol 25 mg Last filled by MD on- 11/01/2013 #180 x3 Last Appt: 10/23/2014 saw [facility] Next Appt: none Please advise refill?

## 2014-11-24 ENCOUNTER — Other Ambulatory Visit: Payer: Self-pay | Admitting: Family Medicine

## 2014-11-24 DIAGNOSIS — M5137 Other intervertebral disc degeneration, lumbosacral region: Secondary | ICD-10-CM

## 2014-11-24 NOTE — Telephone Encounter (Signed)
Pt contacted office for refill request on the following medications:  HYDROcodone-acetaminophen ([place]) 7.5-325 MG.  Pt is requesting an increase to 175 tablets.  Pt would like to pick this up to 12/05/2014.  Pt is having back pain.  TV#[id number]/OO

## 2014-11-26 DIAGNOSIS — M539 Dorsopathy, unspecified: Secondary | ICD-10-CM | POA: Diagnosis not present

## 2014-11-26 DIAGNOSIS — M533 Sacrococcygeal disorders, not elsewhere classified: Secondary | ICD-10-CM | POA: Diagnosis not present

## 2014-11-27 ENCOUNTER — Ambulatory Visit: Payer: Self-pay

## 2014-11-28 NOTE — Telephone Encounter (Signed)
Pt called to see if his Rx is ready to be picked up/MW

## 2014-12-01 NOTE — Telephone Encounter (Signed)
Pt called stating he will pick this up on 12/08/14/MW

## 2014-12-02 ENCOUNTER — Other Ambulatory Visit: Payer: Self-pay | Admitting: Family Medicine

## 2014-12-02 DIAGNOSIS — M5137 Other intervertebral disc degeneration, lumbosacral region: Secondary | ICD-10-CM

## 2014-12-02 MED ORDER — HYDROCODONE-ACETAMINOPHEN 7.5-325 MG PO TABS: ORAL_TABLET | ORAL | Status: DC

## 2014-12-03 ENCOUNTER — Ambulatory Visit (INDEPENDENT_AMBULATORY_CARE_PROVIDER_SITE_OTHER): Payer: Medicare Other

## 2014-12-03 DIAGNOSIS — E291 Testicular hypofunction: Secondary | ICD-10-CM

## 2014-12-03 MED ORDER — TESTOSTERONE CYPIONATE 200 MG/ML IM SOLN
200.0000 mg | Freq: Once | INTRAMUSCULAR | Status: AC
  Administered 2014-12-03: 200 mg via INTRAMUSCULAR

## 2014-12-03 NOTE — Progress Notes (Signed)
Testosterone IM Injection  Due to Hypogonadism patient is present today for a Testosterone Injection.  Medication: Testosterone Cypionate Dose: 55mL Location: right upper outer buttocks Lot: [id number] Exp:08/2015  Patient tolerated well, no complications were noted  Preformed by: Toniann Fail, LPN   Follow up: 3 weeks

## 2014-12-16 DIAGNOSIS — M533 Sacrococcygeal disorders, not elsewhere classified: Secondary | ICD-10-CM | POA: Diagnosis not present

## 2014-12-24 ENCOUNTER — Ambulatory Visit (INDEPENDENT_AMBULATORY_CARE_PROVIDER_SITE_OTHER): Payer: Medicare Other

## 2014-12-24 ENCOUNTER — Other Ambulatory Visit: Payer: Self-pay | Admitting: Family Medicine

## 2014-12-24 DIAGNOSIS — E291 Testicular hypofunction: Secondary | ICD-10-CM | POA: Diagnosis not present

## 2014-12-24 MED ORDER — TESTOSTERONE CYPIONATE 200 MG/ML IM SOLN
200.0000 mg | Freq: Once | INTRAMUSCULAR | Status: AC
  Administered 2014-12-24: 200 mg via INTRAMUSCULAR

## 2014-12-24 NOTE — Progress Notes (Signed)
Testosterone IM Injection  Due to Hypogonadism patient is present today for a Testosterone Injection.  Medication: Testosterone Cypionate Dose: 54mL Location: left upper outer buttocks Lot: [id number] Exp:08/2015  Patient tolerated well, no complications were noted  Preformed by: Toniann Fail, LPN

## 2015-01-14 ENCOUNTER — Ambulatory Visit (INDEPENDENT_AMBULATORY_CARE_PROVIDER_SITE_OTHER): Payer: Medicare Other

## 2015-01-14 DIAGNOSIS — E291 Testicular hypofunction: Secondary | ICD-10-CM | POA: Diagnosis not present

## 2015-01-14 MED ORDER — TESTOSTERONE CYPIONATE 200 MG/ML IM SOLN
200.0000 mg | Freq: Once | INTRAMUSCULAR | Status: AC
  Administered 2015-01-14: 200 mg via INTRAMUSCULAR

## 2015-01-14 NOTE — Progress Notes (Signed)
Testosterone IM Injection  Due to Hypogonadism patient is present today for a Testosterone Injection.  Medication: Testosterone Cypionate Dose: 1mL Location: left upper outer buttocks Lot: [id number] Exp:08/2015  Patient tolerated well, no complications were noted  Preformed by: Shanterria Franta, LPN   

## 2015-01-19 DIAGNOSIS — M533 Sacrococcygeal disorders, not elsewhere classified: Secondary | ICD-10-CM | POA: Diagnosis not present

## 2015-02-04 ENCOUNTER — Ambulatory Visit (INDEPENDENT_AMBULATORY_CARE_PROVIDER_SITE_OTHER): Payer: Medicare Other

## 2015-02-04 DIAGNOSIS — M5416 Radiculopathy, lumbar region: Secondary | ICD-10-CM | POA: Diagnosis not present

## 2015-02-04 DIAGNOSIS — E291 Testicular hypofunction: Secondary | ICD-10-CM

## 2015-02-04 DIAGNOSIS — M545 Low back pain: Secondary | ICD-10-CM | POA: Diagnosis not present

## 2015-02-04 MED ORDER — TESTOSTERONE CYPIONATE 200 MG/ML IM SOLN: 200.0000 mg | INTRAMUSCULAR | Status: DC

## 2015-02-04 MED ORDER — TESTOSTERONE CYPIONATE 200 MG/ML IM SOLN
200.0000 mg | Freq: Once | INTRAMUSCULAR | Status: AC
  Administered 2015-02-04: 200 mg via INTRAMUSCULAR

## 2015-02-04 NOTE — Progress Notes (Signed)
Testosterone IM Injection  Due to Hypogonadism patient is present today for a Testosterone Injection.  Medication: Testosterone Cypionate Dose: 83mL  Location: right upper outer buttocks Lot:[id number]  Exp: 08/2015  Patient tolerated well, no complications were noted  Preformed by: K.Russell,CMA  Follow up: 3 weeks. Pt was instructed he need to pick up a refill at his pharmacy. He also had his testosterone and hematocrit checked at today visit.

## 2015-02-05 ENCOUNTER — Telehealth: Payer: Self-pay

## 2015-02-05 DIAGNOSIS — E291 Testicular hypofunction: Secondary | ICD-10-CM

## 2015-02-05 LAB — TESTOSTERONE: Testosterone: 55 ng/dL — ABNORMAL LOW (ref 348–1197)

## 2015-02-05 LAB — HEMATOCRIT: Hematocrit: 39.7 % (ref 37.5–51.0)

## 2015-02-05 NOTE — Telephone Encounter (Signed)
Spoke with pt in reference to testosterone levels. Pt will RTC next week for lab draw before 9:00am.

## 2015-02-05 NOTE — Telephone Encounter (Signed)
-----   Message from Nori Riis, PA-C sent at 02/05/2015  8:30 AM EDT ----- Patient's testosterone level is low, but it looks like it was drawn in the afternoon. I suggest we repeat the testosterone before 10 AM.

## 2015-02-06 ENCOUNTER — Telehealth: Payer: Self-pay

## 2015-02-06 DIAGNOSIS — M545 Low back pain: Secondary | ICD-10-CM | POA: Diagnosis not present

## 2015-02-06 DIAGNOSIS — M5416 Radiculopathy, lumbar region: Secondary | ICD-10-CM | POA: Diagnosis not present

## 2015-02-06 NOTE — Telephone Encounter (Signed)
Pt rx for testosterone was faxed to [facility].

## 2015-02-09 ENCOUNTER — Other Ambulatory Visit: Payer: Medicare Other

## 2015-02-09 DIAGNOSIS — E291 Testicular hypofunction: Secondary | ICD-10-CM

## 2015-02-09 DIAGNOSIS — M5416 Radiculopathy, lumbar region: Secondary | ICD-10-CM | POA: Diagnosis not present

## 2015-02-09 DIAGNOSIS — M545 Low back pain: Secondary | ICD-10-CM | POA: Diagnosis not present

## 2015-02-10 ENCOUNTER — Other Ambulatory Visit: Payer: Self-pay

## 2015-02-10 DIAGNOSIS — F4323 Adjustment disorder with mixed anxiety and depressed mood: Secondary | ICD-10-CM | POA: Diagnosis not present

## 2015-02-10 LAB — TESTOSTERONE: Testosterone: 980 ng/dL (ref 348–1197)

## 2015-02-12 DIAGNOSIS — M545 Low back pain: Secondary | ICD-10-CM | POA: Diagnosis not present

## 2015-02-12 DIAGNOSIS — M5416 Radiculopathy, lumbar region: Secondary | ICD-10-CM | POA: Diagnosis not present

## 2015-02-17 ENCOUNTER — Telehealth: Payer: Self-pay | Admitting: Family Medicine

## 2015-02-17 ENCOUNTER — Encounter: Payer: Self-pay | Admitting: Family Medicine

## 2015-02-17 ENCOUNTER — Ambulatory Visit (INDEPENDENT_AMBULATORY_CARE_PROVIDER_SITE_OTHER): Payer: Medicare Other | Admitting: Family Medicine

## 2015-02-17 VITALS — BP 130/60 | HR 64 | Temp 98.5°F | Resp 16 | Ht 69.0 in | Wt 161.0 lb

## 2015-02-17 DIAGNOSIS — Z23 Encounter for immunization: Secondary | ICD-10-CM | POA: Diagnosis not present

## 2015-02-17 DIAGNOSIS — S46911A Strain of unspecified muscle, fascia and tendon at shoulder and upper arm level, right arm, initial encounter: Secondary | ICD-10-CM | POA: Diagnosis not present

## 2015-02-17 NOTE — Progress Notes (Signed)
       Patient: Ivan Salazar Male    DOB: [birth year]   81 y.o.   MRN: [id number] Visit Date: 02/17/2015  Today's Provider: Lelon Huh, MD   Chief Complaint  Patient presents with  . Shoulder Pain   Subjective:    HPI  Patient fell out of bed around 2 weeks ago on his right side. Arm and shoulder have been hurting ever since. Pain is worsening.   Allergies  Allergen Reactions  . Celecoxib     Noxius taste in mouth   Previous Medications   ATORVASTATIN (LIPITOR) 80 MG TABLET    TAKE 1 TABLET DAILY   CLOPIDOGREL (PLAVIX) 75 MG TABLET    TAKE 1 TABLET DAILY   ENALAPRIL (VASOTEC) 5 MG TABLET    Take by mouth.   HYDROCODONE-ACETAMINOPHEN (NORCO) 7.5-325 MG PER TABLET    1 tablet every 4-6 hours as needed   LATANOPROST (XALATAN) 0.005 % OPHTHALMIC SOLUTION       METOPROLOL TARTRATE (LOPRESSOR) 25 MG TABLET    Take 1 tablet (25 mg total) by mouth 2 (two) times daily.   RANITIDINE (ZANTAC) 300 MG CAPSULE    Take by mouth.   TESTOSTERONE CYPIONATE (DEPOTESTOSTERONE CYPIONATE) 200 MG/ML INJECTION    Inject 1 mL (200 mg total) into the muscle every 28 (twenty-eight) days.   ZOLPIDEM (AMBIEN) 10 MG TABLET    Take by mouth.    Review of Systems  Respiratory: Negative for chest tightness and shortness of breath.   Cardiovascular: Negative for chest pain and palpitations.  Musculoskeletal: Positive for neck stiffness.       Right shoulder and arm pain x2 weeks. Patient says pain is constant   Neurological: Negative for dizziness, light-headedness and headaches.    Social History  Substance Use Topics  . Smoking status: Former Smoker -- 0.50 packs/day for 20 years    Quit date: 05/01/1977  . Smokeless tobacco: Never Used  . Alcohol Use: Yes     Comment: moderate; 2 drinks daily   Objective:   BP 130/60 mmHg  Pulse 64  Temp(Src) 98.5 F (36.9 C) (Oral)  Resp 16  Ht 5\' 9"  (1.753 m)  Wt 161 lb (73.029 kg)  BMI 23.76 kg/m2  SpO2 97%  Physical Exam  MS: Tender over  superior aspect of scapular and upper lateral arm. Pain with external rotation and abduction > 90 degrees.      Assessment & Plan:     1. Shoulder strain, right, initial encounter Expect gradual resolution over the next 2-3 weeks. He is to start alternating applications of heat and ice.   2. Need for influenza vaccination Fluzone HD vaccine given today.        Lelon Huh, MD  [facility]

## 2015-02-17 NOTE — Telephone Encounter (Signed)
Pt stated that he didn't think to discuss this at his OV this morning but he has lost about 10 pounds since June. Pt stated he hasn't really made any changes to lose weight. Pt wanted to see if he should have any test done or if this was ok. Please advise. Thanks TNP

## 2015-02-18 ENCOUNTER — Ambulatory Visit: Payer: Medicare Other | Admitting: Family Medicine

## 2015-02-19 ENCOUNTER — Telehealth: Payer: Self-pay

## 2015-02-19 DIAGNOSIS — M5416 Radiculopathy, lumbar region: Secondary | ICD-10-CM | POA: Diagnosis not present

## 2015-02-19 DIAGNOSIS — M545 Low back pain: Secondary | ICD-10-CM | POA: Diagnosis not present

## 2015-02-19 NOTE — Telephone Encounter (Signed)
LMOM

## 2015-02-19 NOTE — Telephone Encounter (Signed)
-----   Message from Nori Riis, PA-C sent at 02/18/2015  5:10 PM EDT ----- Patient's testosterone has returned to high for his age group. I suggest he reduce his injections to 0.5 mg every 3 weeks and recheck another morning testosterone before 9 AM in 1 month.

## 2015-02-19 NOTE — Telephone Encounter (Signed)
Spoke with pt in reference to testosterone results. Made pt aware of wanting to go to 0.42mL. Pt was very upset and concerned about wanting to cut the dosage in half. Pt stated he did not want to do that. Please advise.

## 2015-02-19 NOTE — Telephone Encounter (Signed)
It is not safe for a gentleman  of his age to have a testosterone level greater than 500.   He may suffer cardiac consequences.  If he is upset about the reduction of the dose, he may seek cardiac clearance from his cardiologist to maintain the current dose with a high levels of testosterone.

## 2015-02-20 ENCOUNTER — Telehealth: Payer: Self-pay | Admitting: Family Medicine

## 2015-02-23 DIAGNOSIS — M6281 Muscle weakness (generalized): Secondary | ICD-10-CM | POA: Diagnosis not present

## 2015-02-23 NOTE — Telephone Encounter (Signed)
That's the difficulty with the testosterone injections.  One gets huge peaks and troughs and it is very hard to regulate the dosage on some people.  If he wants more consistent levels, I would suggest a topical that he applies every day.

## 2015-02-23 NOTE — Telephone Encounter (Signed)
Spoke with pt in reference to testosterone dosage. Pt voiced concern about his testosterone levels being at 10 four months ago, on 02/04/15-55 and 02/09/15-980. Pt requested an explanation of how his levels could be so low and then all of a sudden jump up and he has been getting the same dosages. Please advise.

## 2015-02-24 NOTE — Telephone Encounter (Signed)
LMOM

## 2015-02-25 ENCOUNTER — Ambulatory Visit: Payer: Medicare Other

## 2015-02-27 NOTE — Telephone Encounter (Signed)
LMOM

## 2015-02-28 ENCOUNTER — Other Ambulatory Visit: Payer: Self-pay | Admitting: Family Medicine

## 2015-02-28 NOTE — Telephone Encounter (Signed)
Please call in zolpidem  

## 2015-03-02 ENCOUNTER — Ambulatory Visit: Payer: Medicare Other | Admitting: Family Medicine

## 2015-03-02 NOTE — Telephone Encounter (Signed)
Rx called in to pharmacy. 

## 2015-03-03 ENCOUNTER — Ambulatory Visit (INDEPENDENT_AMBULATORY_CARE_PROVIDER_SITE_OTHER): Payer: Medicare Other | Admitting: Family Medicine

## 2015-03-03 ENCOUNTER — Encounter: Payer: Self-pay | Admitting: Family Medicine

## 2015-03-03 VITALS — BP 102/52 | HR 68 | Temp 98.1°F | Resp 16 | Ht 70.0 in | Wt 158.0 lb

## 2015-03-03 DIAGNOSIS — S46911A Strain of unspecified muscle, fascia and tendon at shoulder and upper arm level, right arm, initial encounter: Secondary | ICD-10-CM | POA: Diagnosis not present

## 2015-03-03 DIAGNOSIS — R634 Abnormal weight loss: Secondary | ICD-10-CM | POA: Diagnosis not present

## 2015-03-03 DIAGNOSIS — M940 Chondrocostal junction syndrome [Tietze]: Secondary | ICD-10-CM | POA: Insufficient documentation

## 2015-03-03 NOTE — Telephone Encounter (Signed)
LMOM- not able to get in touch with pt. Pt has an appt 03/06/15.

## 2015-03-03 NOTE — Progress Notes (Signed)
Patient: Ivan Salazar Male    DOB: [birth year]   81 y.o.   MRN: [id number] Visit Date: 03/03/2015  Today's Provider: Lelon Huh, MD   Chief Complaint  Patient presents with  . Shoulder Pain  . Weight Loss   Subjective:    Shoulder Pain  The pain is present in the right shoulder. This is a recurrent problem. Episode onset: 2. There has been a history of trauma (fell a few weeks ago). The problem has been gradually worsening. The quality of the pain is described as sharp (sharp shooting pain when raising his arm). The pain is moderate. Associated symptoms include a limited range of motion. Pertinent negatives include no fever, joint swelling, numbness, stiffness or tingling.  Patient was seen 02/17/2015 for right shoulder pain after a minor fall. Exam was fairly unremarkable at that time and he was advised to alternate ice and heat applications. He states he had been improving, but thinks he may have re-njured shoulder lifting boxes and  tossing garbage bags into dumpster  Abnormal Weight loss: Patient comes in stating he has been loosing weight for the past 4 months without trying. Patient states he has lost 13 pounds in 4 months. Patient reports he has had a decrease in appetite.   Wt Readings from Last 3 Encounters:  03/03/15 158 lb (71.668 kg)  02/17/15 161 lb (73.029 kg)  10/23/14 171 lb (77.565 kg)       Allergies  Allergen Reactions  . Celebrex [Celecoxib]     Noxius taste in mouth   Previous Medications   ATORVASTATIN (LIPITOR) 80 MG TABLET    TAKE 1 TABLET DAILY   CLOPIDOGREL (PLAVIX) 75 MG TABLET    TAKE 1 TABLET DAILY   ENALAPRIL (VASOTEC) 5 MG TABLET    Take 5 mg by mouth daily.    HYDROCODONE-ACETAMINOPHEN (NORCO) 7.5-325 MG PER TABLET    1 tablet every 4-6 hours as needed   LATANOPROST (XALATAN) 0.005 % OPHTHALMIC SOLUTION       METOPROLOL TARTRATE (LOPRESSOR) 25 MG TABLET    Take 1 tablet (25 mg total) by mouth 2 (two) times daily.   RANITIDINE (ZANTAC)  300 MG CAPSULE    Take by mouth.   TESTOSTERONE CYPIONATE (DEPOTESTOSTERONE CYPIONATE) 200 MG/ML INJECTION    Inject 1 mL (200 mg total) into the muscle every 28 (twenty-eight) days.   ZOLPIDEM (AMBIEN) 10 MG TABLET    TAKE ONE-HALF TO ONE TABLET EVERY NIGHT AT BEDTIME AS NEEDED    Review of Systems  Constitutional: Positive for appetite change and unexpected weight change. Negative for fever and chills.  Respiratory: Negative for chest tightness, shortness of breath and wheezing.   Cardiovascular: Negative for chest pain and palpitations.  Gastrointestinal: Negative for nausea, vomiting and abdominal pain.  Musculoskeletal: Positive for myalgias (right shoulder). Negative for stiffness.  Neurological: Negative for tingling and numbness.    Social History  Substance Use Topics  . Smoking status: Former Smoker -- 0.50 packs/day for 20 years    Quit date: 05/01/1977  . Smokeless tobacco: Never Used  . Alcohol Use: Yes     Comment: moderate; 2 drinks daily   Objective:   BP 102/52 mmHg  Pulse 68  Temp(Src) 98.1 F (36.7 C) (Oral)  Resp 16  Ht 5\' 10"  (1.778 m)  Wt 158 lb (71.668 kg)  BMI 22.67 kg/m2  SpO2 97%  Physical Exam    General Appearance:    Alert, cooperative, no distress  Eyes:    PERRL, conjunctiva/corneas clear, EOM's intact       Lungs:     Clear to auscultation bilaterally, respirations unlabored  Heart:    Regular rate and rhythm  Neurologic:   Awake, alert, oriented x 3. No apparent focal neurological           defect.   MS:    MS: Swollen slightly tender right shoulder. Unable to actively abduct or forward flex arm more then 10 degrees. Passive abduction and forward flexion limited to 30 degrees due to pain. Unable to passively rotate shoulder due to pain.        Assessment & Plan:     1. Shoulder strain, right, initial encounter He has severely limited range of motion. Needs further orthopedic evaluation as this may be surgical problem.  - Ambulatory  referral to Orthopedic Surgery  2. Loss of weight About 13 pounds in the last 4 months. Normal exam. He did have very low testosterone level last month which is scheduled to be rechecked. Consider additional laboratory evaluation if this continues after testosterone levels are corrected.         Lelon Huh, MD  [facility]

## 2015-03-04 ENCOUNTER — Other Ambulatory Visit: Payer: Self-pay | Admitting: Specialist

## 2015-03-04 DIAGNOSIS — M75121 Complete rotator cuff tear or rupture of right shoulder, not specified as traumatic: Secondary | ICD-10-CM

## 2015-03-05 DIAGNOSIS — R634 Abnormal weight loss: Secondary | ICD-10-CM | POA: Insufficient documentation

## 2015-03-06 ENCOUNTER — Ambulatory Visit: Payer: Medicare Other | Admitting: Urology

## 2015-03-06 DIAGNOSIS — M25411 Effusion, right shoulder: Secondary | ICD-10-CM | POA: Diagnosis not present

## 2015-03-06 DIAGNOSIS — M2540 Effusion, unspecified joint: Secondary | ICD-10-CM | POA: Diagnosis not present

## 2015-03-06 DIAGNOSIS — M779 Enthesopathy, unspecified: Secondary | ICD-10-CM | POA: Diagnosis not present

## 2015-03-06 DIAGNOSIS — M75121 Complete rotator cuff tear or rupture of right shoulder, not specified as traumatic: Secondary | ICD-10-CM | POA: Diagnosis not present

## 2015-03-06 DIAGNOSIS — M7521 Bicipital tendinitis, right shoulder: Secondary | ICD-10-CM | POA: Diagnosis not present

## 2015-03-06 DIAGNOSIS — S46119A Strain of muscle, fascia and tendon of long head of biceps, unspecified arm, initial encounter: Secondary | ICD-10-CM | POA: Diagnosis not present

## 2015-03-06 DIAGNOSIS — M7551 Bursitis of right shoulder: Secondary | ICD-10-CM | POA: Diagnosis not present

## 2015-03-09 ENCOUNTER — Ambulatory Visit: Payer: Medicare Other | Admitting: Urology

## 2015-03-09 ENCOUNTER — Encounter: Payer: Self-pay | Admitting: Family Medicine

## 2015-03-09 ENCOUNTER — Ambulatory Visit (INDEPENDENT_AMBULATORY_CARE_PROVIDER_SITE_OTHER): Payer: Medicare Other | Admitting: Family Medicine

## 2015-03-09 ENCOUNTER — Encounter: Payer: Self-pay | Admitting: Urology

## 2015-03-09 ENCOUNTER — Telehealth: Payer: Self-pay | Admitting: Family Medicine

## 2015-03-09 ENCOUNTER — Other Ambulatory Visit: Payer: Self-pay | Admitting: Family Medicine

## 2015-03-09 ENCOUNTER — Ambulatory Visit (INDEPENDENT_AMBULATORY_CARE_PROVIDER_SITE_OTHER): Payer: Medicare Other | Admitting: Urology

## 2015-03-09 VITALS — BP 134/74 | HR 67 | Resp 16 | Ht 70.0 in | Wt 155.8 lb

## 2015-03-09 VITALS — BP 126/60 | HR 68 | Resp 18 | Ht 70.0 in | Wt 156.0 lb

## 2015-03-09 DIAGNOSIS — Z125 Encounter for screening for malignant neoplasm of prostate: Secondary | ICD-10-CM

## 2015-03-09 DIAGNOSIS — R634 Abnormal weight loss: Secondary | ICD-10-CM

## 2015-03-09 DIAGNOSIS — E291 Testicular hypofunction: Secondary | ICD-10-CM

## 2015-03-09 DIAGNOSIS — M5137 Other intervertebral disc degeneration, lumbosacral region: Secondary | ICD-10-CM

## 2015-03-09 DIAGNOSIS — N401 Enlarged prostate with lower urinary tract symptoms: Secondary | ICD-10-CM | POA: Diagnosis not present

## 2015-03-09 DIAGNOSIS — E785 Hyperlipidemia, unspecified: Secondary | ICD-10-CM | POA: Diagnosis not present

## 2015-03-09 DIAGNOSIS — N138 Other obstructive and reflux uropathy: Secondary | ICD-10-CM

## 2015-03-09 MED ORDER — HYDROCODONE-ACETAMINOPHEN 7.5-325 MG PO TABS: ORAL_TABLET | ORAL | Status: DC

## 2015-03-09 MED ORDER — TESTOSTERONE CYPIONATE 200 MG/ML IM SOLN
200.0000 mg | Freq: Once | INTRAMUSCULAR | Status: AC
  Administered 2015-03-09: 200 mg via INTRAMUSCULAR

## 2015-03-09 NOTE — Progress Notes (Signed)
Testosterone IM Injection  Due to Hypogonadism patient is present today for a Testosterone Injection.  Medication: Testosterone Cypionate Dose: 100 mg 0.5cc Location: right upper outer buttocks Lot: 142132.1 Exp:08/2015  Patient tolerated well, no complications were noted  Preformed by: Lyndee Hensen CMA  Follow up: 3 weeks

## 2015-03-09 NOTE — Progress Notes (Signed)
Patient ID: TAN CLOPPER, male   DOB: [birth year], 81 y.o.   MRN: [id number]       Patient: DOSS CYBULSKI Male    DOB: 1932/03/17   81 y.o.   MRN: [id number] Visit Date: 03/09/2015  Today's Provider: Lelon Huh, MD   Chief Complaint  Patient presents with  . Shoulder Pain  . Weight Loss   Subjective:    Shoulder Pain  The pain is present in the right shoulder. This is a recurrent problem. The current episode started more than 1 month ago. The problem occurs constantly. The problem has been unchanged. The quality of the pain is described as aching and dull. The pain is at a severity of 6/10. The pain is moderate. Pertinent negatives include no fever or numbness. He has tried oral narcotics for the symptoms. The treatment provided moderate relief.  He saw Dr. Sabra Heck last week and was given steroid injection and scheduled MRI at [facility] which was done on 03/06/2015   Weight Loss  Patient reports that he has noticed that he has lost weight unintentional over the past few months. His weight is noted to have dropped from 173 to 156 since January. Has not had much of an appetite, States he had a lot of stress  Setting up [employer] walk this year which may be affecting his appetite. Has also been getting apartment renovated which has been stressful.      Allergies  Allergen Reactions  . Celebrex [Celecoxib]     Noxius taste in mouth   Previous Medications   ATORVASTATIN (LIPITOR) 80 MG TABLET    TAKE 1 TABLET DAILY   CLOPIDOGREL (PLAVIX) 75 MG TABLET    TAKE 1 TABLET DAILY   ENALAPRIL (VASOTEC) 5 MG TABLET    Take 5 mg by mouth daily.    HYDROCODONE-ACETAMINOPHEN (NORCO) 7.5-325 MG TABLET    1 tablet every 4-6 hours as needed   LATANOPROST (XALATAN) 0.005 % OPHTHALMIC SOLUTION       METOPROLOL TARTRATE (LOPRESSOR) 25 MG TABLET    Take 1 tablet (25 mg total) by mouth 2 (two) times daily.   RANITIDINE (ZANTAC) 300 MG CAPSULE    Take by mouth.   TESTOSTERONE CYPIONATE  (DEPOTESTOSTERONE CYPIONATE) 200 MG/ML INJECTION    Inject 1 mL (200 mg total) into the muscle every 28 (twenty-eight) days.   ZOLPIDEM (AMBIEN) 10 MG TABLET    TAKE ONE-HALF TO ONE TABLET EVERY NIGHT AT BEDTIME AS NEEDED    Review of Systems  Constitutional: Positive for unexpected weight change. Negative for fever, chills, diaphoresis, appetite change and fatigue.  HENT: Negative.  Negative for ear pain, hearing loss and nosebleeds.   Respiratory: Negative.  Negative for cough, chest tightness and shortness of breath.   Cardiovascular: Negative.  Negative for chest pain.  Gastrointestinal: Negative for abdominal pain, constipation, blood in stool and abdominal distention.  Endocrine: Negative for cold intolerance.  Musculoskeletal: Positive for joint swelling. Negative for arthralgias.  Neurological: Negative for dizziness, light-headedness, numbness and headaches.  Psychiatric/Behavioral: Negative.  Negative for confusion.    Social History  Substance Use Topics  . Smoking status: Former Smoker -- 0.50 packs/day for 20 years    Quit date: 05/01/1977  . Smokeless tobacco: Never Used  . Alcohol Use: Yes     Comment: moderate; 2 drinks daily   Objective:   BP 126/60 mmHg  Pulse 68  Resp 18  Ht 5\' 10"  (1.778 m)  Wt 156 lb (70.761 kg)  BMI 22.38 kg/m2  Physical Exam   General Appearance:    Alert, cooperative, no distress  Eyes:    PERRL, conjunctiva/corneas clear, EOM's intact       Lungs:     Clear to auscultation bilaterally, respirations unlabored  Heart:    Regular rate and rhythm  Neurologic:   Awake, alert, oriented x 3. No apparent focal neurological           defect.           Assessment & Plan:     1. Loss of weight Possibly due to poor appetite from stress. He had PSA, lipids, and hepatic panel ordered at urology this morning. Will add labs below - T4 AND TSH - CBC - Basic metabolic panel - PSA  His last colonoscopy was April 2012 and advised to repeat in  5 years.       Lelon Huh, MD  [facility]

## 2015-03-09 NOTE — Progress Notes (Signed)
03/09/2015 1:58 PM   Ivan Salazar 1931/12/25 [id number]  Referring provider: Birdie Sons, MD [address] [place], [place] [address]  Chief Complaint  Patient presents with  . Hypogonadism    HPI: Patient is an 81 year old white male with hypogonadism who is managing his condition with testosterone cypionate injections who presents today to discuss his erratic testosterone levels.    He was originally receiving injections every five weeks and his levels returned 10 ng/dL.  His injections were increased to 200 mg IM every three weeks and his testosterone levels returned at 55 ng/dL.  This level was drawn in the afternoon and a repeated level returned at 980 ng/dL.  It was suggested to reduce his injections to 0.5 mg every 3 weeks and recheck the levels in one month before 9 AM.    The patient did not want to reduce his dose of testosterone and presents today for a discussion.  He has also had a 10 lb unintentional weight loss for which he is seeing his PCP for this afternoon.      PMH: Past Medical History  Diagnosis Date  . Hypogonadism in male   . Impotence   . Nocturia   . CAD (coronary artery disease)   . Carpal tunnel syndrome   . HLD (hyperlipidemia)   . BPH (benign prostatic hyperplasia)   . DDD (degenerative disc disease), cervical     Surgical History: Past Surgical History  Procedure Laterality Date  . Cardiac catheterization  2004  . Coronary stent placement  2005  . Bilateral carpal tunnel release      05/12/2011, 06/09/2011  . Upper gi endoscopy  08/02/2010    Dr. Tedra Coupe, gastritis. H Pylori negative  . Coronary artery bypass graft  2007    5; [facility]    Home Medications:    Medication List       This list is accurate as of: 03/09/15  1:58 PM.  Always use your most recent med list.               atorvastatin 80 MG tablet  Commonly known as:  LIPITOR  TAKE 1 TABLET DAILY     clopidogrel 75 MG tablet  Commonly known as:   PLAVIX  TAKE 1 TABLET DAILY     enalapril 5 MG tablet  Commonly known as:  VASOTEC  Take 5 mg by mouth daily.     HYDROcodone-acetaminophen 7.5-325 MG tablet  Commonly known as:  NORCO  1 tablet every 4-6 hours as needed     latanoprost 0.005 % ophthalmic solution  Commonly known as:  XALATAN     metoprolol tartrate 25 MG tablet  Commonly known as:  LOPRESSOR  Take 1 tablet (25 mg total) by mouth 2 (two) times daily.     ranitidine 300 MG capsule  Commonly known as:  ZANTAC  Take by mouth.     testosterone cypionate 200 MG/ML injection  Commonly known as:  DEPOTESTOSTERONE CYPIONATE  Inject 1 mL (200 mg total) into the muscle every 28 (twenty-eight) days.     zolpidem 10 MG tablet  Commonly known as:  AMBIEN  TAKE ONE-HALF TO ONE TABLET EVERY NIGHT AT BEDTIME AS NEEDED        Allergies:  Allergies  Allergen Reactions  . Celebrex [Celecoxib]     Noxius taste in mouth    Family History: Family History  Problem Relation Age of Onset  . Cirrhosis Father  of liver    Social History:  reports that he quit smoking about 37 years ago. He has never used smokeless tobacco. He reports that he drinks alcohol. He reports that he does not use illicit drugs.  ROS: UROLOGY Frequent Urination?: No Hard to postpone urination?: No Burning/pain with urination?: No Get up at night to urinate?: Yes Leakage of urine?: No Urine stream starts and stops?: No Trouble starting stream?: No Do you have to strain to urinate?: No Blood in urine?: No Urinary tract infection?: No Sexually transmitted disease?: No Injury to kidneys or bladder?: No Painful intercourse?: No Weak stream?: No Erection problems?: No Penile pain?: No  Gastrointestinal Nausea?: No Vomiting?: No Indigestion/heartburn?: No Diarrhea?: No Constipation?: No  Constitutional Fever: No Night sweats?: No Weight loss?: Yes Fatigue?: No  Skin Skin rash/lesions?: No Itching?: No  Eyes Blurred  vision?: No Double vision?: No  Ears/Nose/Throat Sore throat?: No Sinus problems?: No  Hematologic/Lymphatic Swollen glands?: No Easy bruising?: Yes  Cardiovascular Leg swelling?: No Chest pain?: No  Respiratory Cough?: No Shortness of breath?: No  Endocrine Excessive thirst?: No  Musculoskeletal Back pain?: Yes Joint pain?: No  Neurological Headaches?: No Dizziness?: No  Psychologic Depression?: No Anxiety?: No  Physical Exam: BP 134/74 mmHg  Pulse 67  Resp 16  Ht 5\' 10"  (1.778 m)  Wt 155 lb 12.8 oz (70.67 kg)  BMI 22.35 kg/m2  Constitutional: Well nourished. Alert and oriented, No acute distress. HEENT: [place] AT, moist mucus membranes. Trachea midline, no masses. Cardiovascular: No clubbing, cyanosis, or edema. Respiratory: Normal respiratory effort, no increased work of breathing. Skin: No rashes, several bruises,  no suspicious lesions. Lymph: No cervical or inguinal adenopathy. Neurologic: Grossly intact, no focal deficits, moving all 4 extremities. Psychiatric: Normal mood and affect.   Laboratory Data:  Lab Results  Component Value Date   CREATININE 1.3 02/11/2014    PSA history  0.3 ng/mL on 11/29/2013  Lab Results  Component Value Date   TESTOSTERONE 980 02/09/2015     Assessment & Plan:    1. Hypogonadism:   I explained to the patient that testosterone cypionate has peaks and troughs, but I am mostly interested in the peaks. It is important that his testosterone not remain elevated due to the risk of an increase hematocrit levels, MI and strokes.   Patient is also concerned about these side effects.  He would like to receive an injection today, but then he would like to reassess if he would like to continue the medication. He will let us know before his next injection is due.  I've also obtained a lipid panel and liver function test as well as a PSA at today's visit as testosterone therapy can also affect these values.  Greater than 50%  was spent in counseling & coordination of care with the patient.  Return in about 3 weeks (around 03/30/2015) for testosterone injections.  Zara Council, [facility] [address], [place], [place] [address] [phone]

## 2015-03-09 NOTE — Telephone Encounter (Signed)
Pt contacted office for refill request on the following medications:  HYDROcodone-acetaminophen ([place]) 7.5-325 MG.  90 day supply.  IP#[phone]

## 2015-03-10 LAB — CBC
HEMATOCRIT: 38.9 % (ref 37.5–51.0)
Hemoglobin: 12.9 g/dL (ref 12.6–17.7)
MCH: 33.2 pg — AB (ref 26.6–33.0)
MCHC: 33.2 g/dL (ref 31.5–35.7)
MCV: 100 fL — ABNORMAL HIGH (ref 79–97)
PLATELETS: 202 10*3/uL (ref 150–379)
RBC: 3.88 x10E6/uL — ABNORMAL LOW (ref 4.14–5.80)
RDW: 14.2 % (ref 12.3–15.4)
WBC: 5.7 10*3/uL (ref 3.4–10.8)

## 2015-03-10 LAB — BASIC METABOLIC PANEL
BUN / CREAT RATIO: 15 (ref 10–22)
BUN: 16 mg/dL (ref 8–27)
CO2: 27 mmol/L (ref 18–29)
CREATININE: 1.07 mg/dL (ref 0.76–1.27)
Calcium: 9.4 mg/dL (ref 8.6–10.2)
Chloride: 100 mmol/L (ref 97–106)
GFR, EST AFRICAN AMERICAN: 75 mL/min/{1.73_m2} (ref >59)
GFR, EST NON AFRICAN AMERICAN: 65 mL/min/{1.73_m2} (ref >59)
Glucose: 104 mg/dL — ABNORMAL HIGH (ref 65–99)
Potassium: 5 mmol/L (ref 3.5–5.2)
SODIUM: 139 mmol/L (ref 136–144)

## 2015-03-10 LAB — HEPATIC FUNCTION PANEL
ALBUMIN: 3.9 g/dL (ref 3.5–4.7)
ALK PHOS: 49 IU/L (ref 39–117)
ALT: 12 IU/L (ref 0–44)
AST: 17 IU/L (ref 0–40)
BILIRUBIN TOTAL: 1.3 mg/dL — AB (ref 0.0–1.2)
BILIRUBIN, DIRECT: 0.35 mg/dL (ref 0.00–0.40)
Total Protein: 6.1 g/dL (ref 6.0–8.5)

## 2015-03-10 LAB — T4 AND TSH
T4 TOTAL: 7.7 ug/dL (ref 4.5–12.0)
TSH: 1.38 u[IU]/mL (ref 0.450–4.500)

## 2015-03-10 LAB — LIPID PANEL
CHOLESTEROL TOTAL: 148 mg/dL (ref 100–199)
Chol/HDL Ratio: 2.2 ratio units (ref 0.0–5.0)
HDL: 66 mg/dL (ref >39)
LDL Calculated: 58 mg/dL (ref 0–99)
Triglycerides: 120 mg/dL (ref 0–149)
VLDL Cholesterol Cal: 24 mg/dL (ref 5–40)

## 2015-03-10 LAB — PSA: Prostate Specific Ag, Serum: 0.5 ng/mL (ref 0.0–4.0)

## 2015-03-10 NOTE — Telephone Encounter (Signed)
Patient notified of results. Patient expressed understanding.  

## 2015-03-10 NOTE — Telephone Encounter (Signed)
Pt called and stated he was returning Michelle's call. Thanks TNP

## 2015-03-11 DIAGNOSIS — M75121 Complete rotator cuff tear or rupture of right shoulder, not specified as traumatic: Secondary | ICD-10-CM | POA: Diagnosis not present

## 2015-03-12 NOTE — Telephone Encounter (Signed)
Faxed copy of results to pt's cardiologist Dr. Chong Sicilian [phone]) [phone]). Per patient.

## 2015-03-13 DIAGNOSIS — M25612 Stiffness of left shoulder, not elsewhere classified: Secondary | ICD-10-CM | POA: Diagnosis not present

## 2015-03-13 DIAGNOSIS — M25611 Stiffness of right shoulder, not elsewhere classified: Secondary | ICD-10-CM | POA: Diagnosis not present

## 2015-03-13 DIAGNOSIS — M25512 Pain in left shoulder: Secondary | ICD-10-CM | POA: Diagnosis not present

## 2015-03-13 DIAGNOSIS — M25511 Pain in right shoulder: Secondary | ICD-10-CM | POA: Diagnosis not present

## 2015-03-16 DIAGNOSIS — M25511 Pain in right shoulder: Secondary | ICD-10-CM | POA: Diagnosis not present

## 2015-03-16 DIAGNOSIS — M25611 Stiffness of right shoulder, not elsewhere classified: Secondary | ICD-10-CM | POA: Diagnosis not present

## 2015-03-16 DIAGNOSIS — M25512 Pain in left shoulder: Secondary | ICD-10-CM | POA: Diagnosis not present

## 2015-03-16 DIAGNOSIS — M25612 Stiffness of left shoulder, not elsewhere classified: Secondary | ICD-10-CM | POA: Diagnosis not present

## 2015-03-18 ENCOUNTER — Ambulatory Visit: Payer: Medicare Other

## 2015-03-19 ENCOUNTER — Telehealth: Payer: Self-pay | Admitting: Urology

## 2015-03-19 DIAGNOSIS — M25511 Pain in right shoulder: Secondary | ICD-10-CM | POA: Diagnosis not present

## 2015-03-19 DIAGNOSIS — E291 Testicular hypofunction: Secondary | ICD-10-CM

## 2015-03-19 DIAGNOSIS — M25612 Stiffness of left shoulder, not elsewhere classified: Secondary | ICD-10-CM | POA: Diagnosis not present

## 2015-03-19 DIAGNOSIS — M25512 Pain in left shoulder: Secondary | ICD-10-CM | POA: Diagnosis not present

## 2015-03-19 DIAGNOSIS — M25611 Stiffness of right shoulder, not elsewhere classified: Secondary | ICD-10-CM | POA: Diagnosis not present

## 2015-03-19 NOTE — Telephone Encounter (Signed)
Pt requested 03/09/15 labs be sent to Dr. Chong Sicilian, [facility].  Sent records 03/19/15 DGS

## 2015-03-23 DIAGNOSIS — M25612 Stiffness of left shoulder, not elsewhere classified: Secondary | ICD-10-CM | POA: Diagnosis not present

## 2015-03-23 DIAGNOSIS — M25512 Pain in left shoulder: Secondary | ICD-10-CM | POA: Diagnosis not present

## 2015-03-23 DIAGNOSIS — M25611 Stiffness of right shoulder, not elsewhere classified: Secondary | ICD-10-CM | POA: Diagnosis not present

## 2015-03-23 DIAGNOSIS — M25511 Pain in right shoulder: Secondary | ICD-10-CM | POA: Diagnosis not present

## 2015-03-31 DIAGNOSIS — M25611 Stiffness of right shoulder, not elsewhere classified: Secondary | ICD-10-CM | POA: Diagnosis not present

## 2015-03-31 DIAGNOSIS — M25511 Pain in right shoulder: Secondary | ICD-10-CM | POA: Diagnosis not present

## 2015-03-31 DIAGNOSIS — M25512 Pain in left shoulder: Secondary | ICD-10-CM | POA: Diagnosis not present

## 2015-03-31 DIAGNOSIS — M25612 Stiffness of left shoulder, not elsewhere classified: Secondary | ICD-10-CM | POA: Diagnosis not present

## 2015-04-01 DIAGNOSIS — M75121 Complete rotator cuff tear or rupture of right shoulder, not specified as traumatic: Secondary | ICD-10-CM | POA: Diagnosis not present

## 2015-04-02 ENCOUNTER — Ambulatory Visit (INDEPENDENT_AMBULATORY_CARE_PROVIDER_SITE_OTHER): Payer: Medicare Other

## 2015-04-02 DIAGNOSIS — E291 Testicular hypofunction: Secondary | ICD-10-CM | POA: Diagnosis not present

## 2015-04-02 MED ORDER — TESTOSTERONE CYPIONATE 200 MG/ML IM SOLN
200.0000 mg | Freq: Once | INTRAMUSCULAR | Status: AC
  Administered 2015-04-02: 200 mg via INTRAMUSCULAR

## 2015-04-02 NOTE — Progress Notes (Signed)
Testosterone IM Injection  Due to Hypogonadism patient is present today for a Testosterone Injection.  Medication: Testosterone Cypionate Dose: 0.76mL Location: right upper outer buttocks Lot: 142132.1 Exp:08/2015  Patient tolerated well, no complications were noted  Preformed by: Toniann Fail, LPN  Follow up: 3 weeks

## 2015-04-03 DIAGNOSIS — M25512 Pain in left shoulder: Secondary | ICD-10-CM | POA: Diagnosis not present

## 2015-04-03 DIAGNOSIS — M25511 Pain in right shoulder: Secondary | ICD-10-CM | POA: Diagnosis not present

## 2015-04-03 DIAGNOSIS — M25612 Stiffness of left shoulder, not elsewhere classified: Secondary | ICD-10-CM | POA: Diagnosis not present

## 2015-04-03 DIAGNOSIS — M25611 Stiffness of right shoulder, not elsewhere classified: Secondary | ICD-10-CM | POA: Diagnosis not present

## 2015-04-07 DIAGNOSIS — M25512 Pain in left shoulder: Secondary | ICD-10-CM | POA: Diagnosis not present

## 2015-04-07 DIAGNOSIS — M25612 Stiffness of left shoulder, not elsewhere classified: Secondary | ICD-10-CM | POA: Diagnosis not present

## 2015-04-07 DIAGNOSIS — M25511 Pain in right shoulder: Secondary | ICD-10-CM | POA: Diagnosis not present

## 2015-04-07 DIAGNOSIS — M25611 Stiffness of right shoulder, not elsewhere classified: Secondary | ICD-10-CM | POA: Diagnosis not present

## 2015-04-08 NOTE — Telephone Encounter (Signed)
Patient will need a testosterone and HCT in January.

## 2015-04-10 DIAGNOSIS — M25612 Stiffness of left shoulder, not elsewhere classified: Secondary | ICD-10-CM | POA: Diagnosis not present

## 2015-04-10 DIAGNOSIS — M25512 Pain in left shoulder: Secondary | ICD-10-CM | POA: Diagnosis not present

## 2015-04-10 DIAGNOSIS — M25611 Stiffness of right shoulder, not elsewhere classified: Secondary | ICD-10-CM | POA: Diagnosis not present

## 2015-04-10 DIAGNOSIS — M25511 Pain in right shoulder: Secondary | ICD-10-CM | POA: Diagnosis not present

## 2015-04-15 DIAGNOSIS — M25611 Stiffness of right shoulder, not elsewhere classified: Secondary | ICD-10-CM | POA: Diagnosis not present

## 2015-04-15 DIAGNOSIS — M25512 Pain in left shoulder: Secondary | ICD-10-CM | POA: Diagnosis not present

## 2015-04-15 DIAGNOSIS — M25511 Pain in right shoulder: Secondary | ICD-10-CM | POA: Diagnosis not present

## 2015-04-15 DIAGNOSIS — M25612 Stiffness of left shoulder, not elsewhere classified: Secondary | ICD-10-CM | POA: Diagnosis not present

## 2015-04-17 DIAGNOSIS — M25511 Pain in right shoulder: Secondary | ICD-10-CM | POA: Diagnosis not present

## 2015-04-17 DIAGNOSIS — M25611 Stiffness of right shoulder, not elsewhere classified: Secondary | ICD-10-CM | POA: Diagnosis not present

## 2015-04-17 DIAGNOSIS — M25512 Pain in left shoulder: Secondary | ICD-10-CM | POA: Diagnosis not present

## 2015-04-17 DIAGNOSIS — M25612 Stiffness of left shoulder, not elsewhere classified: Secondary | ICD-10-CM | POA: Diagnosis not present

## 2015-04-20 DIAGNOSIS — M25611 Stiffness of right shoulder, not elsewhere classified: Secondary | ICD-10-CM | POA: Diagnosis not present

## 2015-04-20 DIAGNOSIS — M25511 Pain in right shoulder: Secondary | ICD-10-CM | POA: Diagnosis not present

## 2015-04-20 DIAGNOSIS — M25612 Stiffness of left shoulder, not elsewhere classified: Secondary | ICD-10-CM | POA: Diagnosis not present

## 2015-04-20 DIAGNOSIS — M25512 Pain in left shoulder: Secondary | ICD-10-CM | POA: Diagnosis not present

## 2015-04-22 DIAGNOSIS — M25611 Stiffness of right shoulder, not elsewhere classified: Secondary | ICD-10-CM | POA: Diagnosis not present

## 2015-04-22 DIAGNOSIS — M25511 Pain in right shoulder: Secondary | ICD-10-CM | POA: Diagnosis not present

## 2015-04-22 DIAGNOSIS — M25512 Pain in left shoulder: Secondary | ICD-10-CM | POA: Diagnosis not present

## 2015-04-22 DIAGNOSIS — M25612 Stiffness of left shoulder, not elsewhere classified: Secondary | ICD-10-CM | POA: Diagnosis not present

## 2015-04-23 ENCOUNTER — Ambulatory Visit (INDEPENDENT_AMBULATORY_CARE_PROVIDER_SITE_OTHER): Payer: Medicare Other

## 2015-04-23 DIAGNOSIS — E291 Testicular hypofunction: Secondary | ICD-10-CM

## 2015-04-23 MED ORDER — TESTOSTERONE CYPIONATE 200 MG/ML IM SOLN
200.0000 mg | Freq: Once | INTRAMUSCULAR | Status: AC
  Administered 2015-04-23: 200 mg via INTRAMUSCULAR

## 2015-04-23 NOTE — Progress Notes (Signed)
Testosterone IM Injection  Due to Hypogonadism patient is present today for a Testosterone Injection.  Medication: Testosterone Cypionate Dose: 0.47mL Location: right upper outer buttocks Lot: 142132.1 Exp:08/2015  Patient tolerated well, no complications were noted  Preformed by: Toniann Fail, LPN

## 2015-04-28 DIAGNOSIS — M25512 Pain in left shoulder: Secondary | ICD-10-CM | POA: Diagnosis not present

## 2015-04-28 DIAGNOSIS — M25511 Pain in right shoulder: Secondary | ICD-10-CM | POA: Diagnosis not present

## 2015-04-28 DIAGNOSIS — M25611 Stiffness of right shoulder, not elsewhere classified: Secondary | ICD-10-CM | POA: Diagnosis not present

## 2015-04-28 DIAGNOSIS — M25612 Stiffness of left shoulder, not elsewhere classified: Secondary | ICD-10-CM | POA: Diagnosis not present

## 2015-04-30 ENCOUNTER — Other Ambulatory Visit: Payer: Self-pay | Admitting: Family Medicine

## 2015-05-05 ENCOUNTER — Encounter: Payer: Self-pay | Admitting: Family Medicine

## 2015-05-05 ENCOUNTER — Ambulatory Visit (INDEPENDENT_AMBULATORY_CARE_PROVIDER_SITE_OTHER): Payer: Medicare Other | Admitting: Family Medicine

## 2015-05-05 VITALS — BP 124/58 | HR 56 | Temp 98.2°F | Resp 16 | Ht 70.0 in | Wt 160.0 lb

## 2015-05-05 DIAGNOSIS — H6123 Impacted cerumen, bilateral: Secondary | ICD-10-CM | POA: Diagnosis not present

## 2015-05-05 NOTE — Progress Notes (Signed)
       Patient: Ivan Salazar Male    DOB: 23-Dec-1931   82 y.o.   MRN: [id number] Visit Date: 05/05/2015  Today's Provider: Lelon Huh, MD   Chief Complaint  Patient presents with  . Ear Fullness    right   Subjective:    Ear Fullness  There is pain in both ears. This is a new problem. The current episode started 1 to 4 weeks ago (2 weeks). The problem has been gradually worsening. There has been no fever. The patient is experiencing no pain. Associated symptoms include diarrhea, ear discharge, hearing loss and rhinorrhea. Pertinent negatives include no abdominal pain, coughing, headaches, neck pain, rash, sore throat or vomiting. Treatments tried: otc allergy medication. The treatment provided no relief.   Both ears feel stopped-up for the last 2 weeks. Popping and crackling sounds when he moves his jaw. No pain, no fever. Does have postnasal dip.    Allergies  Allergen Reactions  . Celebrex [Celecoxib]     Noxius taste in mouth   Previous Medications   ATORVASTATIN (LIPITOR) 80 MG TABLET    TAKE 1 TABLET DAILY   CLOPIDOGREL (PLAVIX) 75 MG TABLET    TAKE 1 TABLET DAILY   ENALAPRIL (VASOTEC) 5 MG TABLET    Take 5 mg by mouth daily.    HYDROCODONE-ACETAMINOPHEN (NORCO) 7.5-325 MG TABLET    1 tablet every 4-6 hours as needed   LATANOPROST (XALATAN) 0.005 % OPHTHALMIC SOLUTION       METOPROLOL TARTRATE (LOPRESSOR) 25 MG TABLET    Take 1 tablet (25 mg total) by mouth 2 (two) times daily.   RANITIDINE (ZANTAC) 300 MG CAPSULE    Take by mouth.   TESTOSTERONE CYPIONATE (DEPOTESTOSTERONE CYPIONATE) 200 MG/ML INJECTION    Inject 1 mL (200 mg total) into the muscle every 28 (twenty-eight) days.   ZOLPIDEM (AMBIEN) 10 MG TABLET    TAKE ONE-HALF TO ONE TABLET EVERY NIGHT AT BEDTIME AS NEEDED    Review of Systems  Constitutional: Negative for fever, chills and appetite change.  HENT: Positive for ear discharge, hearing loss, postnasal drip and rhinorrhea. Negative for sore throat.     Respiratory: Negative for cough, chest tightness, shortness of breath and wheezing.   Cardiovascular: Negative for chest pain and palpitations.  Gastrointestinal: Positive for diarrhea. Negative for nausea, vomiting and abdominal pain.  Musculoskeletal: Negative for neck pain.  Skin: Negative for rash.  Neurological: Negative for headaches.    Social History  Substance Use Topics  . Smoking status: Former Smoker -- 0.50 packs/day for 20 years    Quit date: 05/01/1977  . Smokeless tobacco: Never Used  . Alcohol Use: Yes     Comment: moderate; 2 drinks daily   Objective:   BP 124/58 mmHg  Pulse 56  Temp(Src) 98.2 F (36.8 C) (Oral)  Resp 16  Ht 5\' 10"  (1.778 m)  Wt 160 lb (72.576 kg)  BMI 22.96 kg/m2  SpO2 99%  Physical Exam  Ears: Excessive cerumen in left auditory canal. Right canal completely obstructed by cerumen.     Assessment & Plan:     1. Obstruction of ventilation tube by cerumen, bilateral Both ears irrigated with saline until canals are no longer obstructed. Patient tolerated well and reports improvement in symptoms when finished.          Lelon Huh, MD  [facility]

## 2015-05-06 DIAGNOSIS — M25612 Stiffness of left shoulder, not elsewhere classified: Secondary | ICD-10-CM | POA: Diagnosis not present

## 2015-05-06 DIAGNOSIS — M25611 Stiffness of right shoulder, not elsewhere classified: Secondary | ICD-10-CM | POA: Diagnosis not present

## 2015-05-06 DIAGNOSIS — M25512 Pain in left shoulder: Secondary | ICD-10-CM | POA: Diagnosis not present

## 2015-05-06 DIAGNOSIS — M25511 Pain in right shoulder: Secondary | ICD-10-CM | POA: Diagnosis not present

## 2015-05-13 DIAGNOSIS — M75121 Complete rotator cuff tear or rupture of right shoulder, not specified as traumatic: Secondary | ICD-10-CM | POA: Diagnosis not present

## 2015-05-14 ENCOUNTER — Ambulatory Visit: Payer: Medicare Other

## 2015-05-14 DIAGNOSIS — M25512 Pain in left shoulder: Secondary | ICD-10-CM | POA: Diagnosis not present

## 2015-05-14 DIAGNOSIS — M25511 Pain in right shoulder: Secondary | ICD-10-CM | POA: Diagnosis not present

## 2015-05-14 DIAGNOSIS — M25612 Stiffness of left shoulder, not elsewhere classified: Secondary | ICD-10-CM | POA: Diagnosis not present

## 2015-05-14 DIAGNOSIS — M25611 Stiffness of right shoulder, not elsewhere classified: Secondary | ICD-10-CM | POA: Diagnosis not present

## 2015-05-15 DIAGNOSIS — M7501 Adhesive capsulitis of right shoulder: Secondary | ICD-10-CM | POA: Diagnosis not present

## 2015-05-19 ENCOUNTER — Telehealth: Payer: Self-pay

## 2015-05-19 ENCOUNTER — Telehealth: Payer: Self-pay | Admitting: Family Medicine

## 2015-05-19 DIAGNOSIS — M25612 Stiffness of left shoulder, not elsewhere classified: Secondary | ICD-10-CM | POA: Diagnosis not present

## 2015-05-19 DIAGNOSIS — M25611 Stiffness of right shoulder, not elsewhere classified: Secondary | ICD-10-CM | POA: Diagnosis not present

## 2015-05-19 DIAGNOSIS — M25511 Pain in right shoulder: Secondary | ICD-10-CM | POA: Diagnosis not present

## 2015-05-19 DIAGNOSIS — M25512 Pain in left shoulder: Secondary | ICD-10-CM | POA: Diagnosis not present

## 2015-05-19 NOTE — Telephone Encounter (Signed)
Patient called wanting to give Dr. Caryn Section a heads up that his insurance would be contacting our office for Coverage Determination of the Hydrocodone. Patient states they are trying to reduce Hydrocodone to a Tier 2 or 1 which will reduce the cost that the patient will have to pay. Patient states the insurance company told him that Hydrocodone is a Tier 3 medication right now. Patient states he takes the hydrocodone for pain in his thoracic chest muscles that resulted from open heart surgery he had a few years back (2007 per pt report). Patient states his insurance company will be contacting our office soon, maybe as early as today.

## 2015-05-19 NOTE — Telephone Encounter (Signed)
After opening message pt stated that he needed PA on HYDROcodone-acetaminophen (NORCO) 7.5-325 MG tablet. Medical Records took the call b/c they handle PA. Thanks TNP

## 2015-05-20 ENCOUNTER — Ambulatory Visit (INDEPENDENT_AMBULATORY_CARE_PROVIDER_SITE_OTHER): Payer: Medicare Other

## 2015-05-20 ENCOUNTER — Other Ambulatory Visit: Payer: Self-pay

## 2015-05-20 DIAGNOSIS — E291 Testicular hypofunction: Secondary | ICD-10-CM | POA: Diagnosis not present

## 2015-05-20 MED ORDER — TESTOSTERONE CYPIONATE 200 MG/ML IM SOLN
200.0000 mg | Freq: Once | INTRAMUSCULAR | Status: AC
  Administered 2015-05-20: 200 mg via INTRAMUSCULAR

## 2015-05-20 NOTE — Progress Notes (Signed)
Testosterone IM Injection  Due to Hypogonadism patient is present today for a Testosterone Injection.  Medication: Testosterone Cypionate Dose: 0.40mL Location: left upper outer buttocks Lot: 142132.1 Exp:08/2015  Patient tolerated well, no complications were noted  Preformed by: Toniann Fail, LPN   Follow up: pt had testosterone draw today. Pt will RTC in 2 weeks for lab draw and RTC in 3 weeks for injection.

## 2015-05-21 ENCOUNTER — Telehealth: Payer: Self-pay

## 2015-05-21 DIAGNOSIS — M25511 Pain in right shoulder: Secondary | ICD-10-CM | POA: Diagnosis not present

## 2015-05-21 DIAGNOSIS — M25612 Stiffness of left shoulder, not elsewhere classified: Secondary | ICD-10-CM | POA: Diagnosis not present

## 2015-05-21 DIAGNOSIS — M25611 Stiffness of right shoulder, not elsewhere classified: Secondary | ICD-10-CM | POA: Diagnosis not present

## 2015-05-21 DIAGNOSIS — M25512 Pain in left shoulder: Secondary | ICD-10-CM | POA: Diagnosis not present

## 2015-05-21 LAB — TESTOSTERONE: TESTOSTERONE: 21 ng/dL — AB (ref 348–1197)

## 2015-05-21 NOTE — Telephone Encounter (Signed)
-----   Message from Nori Riis, PA-C sent at 05/21/2015  8:18 AM EST ----- I would like to have his serum testosterone rechecked two weeks after he receives his next shot.

## 2015-05-21 NOTE — Telephone Encounter (Signed)
Lab appt has been made   

## 2015-05-26 DIAGNOSIS — M25512 Pain in left shoulder: Secondary | ICD-10-CM | POA: Diagnosis not present

## 2015-05-26 DIAGNOSIS — M25612 Stiffness of left shoulder, not elsewhere classified: Secondary | ICD-10-CM | POA: Diagnosis not present

## 2015-05-26 DIAGNOSIS — M25511 Pain in right shoulder: Secondary | ICD-10-CM | POA: Diagnosis not present

## 2015-05-26 DIAGNOSIS — M25611 Stiffness of right shoulder, not elsewhere classified: Secondary | ICD-10-CM | POA: Diagnosis not present

## 2015-05-28 DIAGNOSIS — M25612 Stiffness of left shoulder, not elsewhere classified: Secondary | ICD-10-CM | POA: Diagnosis not present

## 2015-05-28 DIAGNOSIS — M25511 Pain in right shoulder: Secondary | ICD-10-CM | POA: Diagnosis not present

## 2015-05-28 DIAGNOSIS — M25611 Stiffness of right shoulder, not elsewhere classified: Secondary | ICD-10-CM | POA: Diagnosis not present

## 2015-05-28 DIAGNOSIS — M25512 Pain in left shoulder: Secondary | ICD-10-CM | POA: Diagnosis not present

## 2015-05-29 ENCOUNTER — Other Ambulatory Visit: Payer: Self-pay | Admitting: Family Medicine

## 2015-05-29 DIAGNOSIS — M5137 Other intervertebral disc degeneration, lumbosacral region: Secondary | ICD-10-CM

## 2015-05-29 MED ORDER — HYDROCODONE-ACETAMINOPHEN 7.5-325 MG PO TABS: ORAL_TABLET | ORAL | Status: DC

## 2015-06-01 ENCOUNTER — Other Ambulatory Visit: Payer: Self-pay | Admitting: Family Medicine

## 2015-06-01 DIAGNOSIS — M25612 Stiffness of left shoulder, not elsewhere classified: Secondary | ICD-10-CM | POA: Diagnosis not present

## 2015-06-01 DIAGNOSIS — M25611 Stiffness of right shoulder, not elsewhere classified: Secondary | ICD-10-CM | POA: Diagnosis not present

## 2015-06-01 DIAGNOSIS — M5137 Other intervertebral disc degeneration, lumbosacral region: Secondary | ICD-10-CM

## 2015-06-01 DIAGNOSIS — M25512 Pain in left shoulder: Secondary | ICD-10-CM | POA: Diagnosis not present

## 2015-06-01 DIAGNOSIS — M25511 Pain in right shoulder: Secondary | ICD-10-CM | POA: Diagnosis not present

## 2015-06-01 MED ORDER — HYDROCODONE-ACETAMINOPHEN 7.5-325 MG PO TABS: ORAL_TABLET | ORAL | Status: DC

## 2015-06-03 ENCOUNTER — Ambulatory Visit: Payer: Medicare Other

## 2015-06-03 DIAGNOSIS — E291 Testicular hypofunction: Secondary | ICD-10-CM

## 2015-06-03 NOTE — Progress Notes (Signed)
Pt requested nurse draw his blood. 2 tubes were drawn from right ac without difficulty. Pt tolerated well. No s/s of adverse reaction noted. Pt will RTC next week for an injection.

## 2015-06-04 ENCOUNTER — Telehealth: Payer: Self-pay

## 2015-06-04 DIAGNOSIS — M25611 Stiffness of right shoulder, not elsewhere classified: Secondary | ICD-10-CM | POA: Diagnosis not present

## 2015-06-04 DIAGNOSIS — E291 Testicular hypofunction: Secondary | ICD-10-CM

## 2015-06-04 DIAGNOSIS — M25511 Pain in right shoulder: Secondary | ICD-10-CM | POA: Diagnosis not present

## 2015-06-04 DIAGNOSIS — M25612 Stiffness of left shoulder, not elsewhere classified: Secondary | ICD-10-CM | POA: Diagnosis not present

## 2015-06-04 DIAGNOSIS — M25512 Pain in left shoulder: Secondary | ICD-10-CM | POA: Diagnosis not present

## 2015-06-04 LAB — HEMATOCRIT: Hematocrit: 38.9 % (ref 37.5–51.0)

## 2015-06-04 LAB — TESTOSTERONE: TESTOSTERONE: 232 ng/dL — AB (ref 348–1197)

## 2015-06-04 NOTE — Telephone Encounter (Signed)
Spoke with pt in reference to increasing testosterone dosage and needing labs 2 weeks later. Pt voiced understanding and appts were made.

## 2015-06-04 NOTE — Telephone Encounter (Signed)
-----   Message from Nori Riis, PA-C sent at 06/04/2015  8:55 AM EST ----- We can increase his injections to 1.0 cc every three weeks and I want another testosterone level checked in 2 weeks after his injection.

## 2015-06-05 ENCOUNTER — Ambulatory Visit (INDEPENDENT_AMBULATORY_CARE_PROVIDER_SITE_OTHER): Payer: Medicare Other | Admitting: Family Medicine

## 2015-06-05 ENCOUNTER — Telehealth: Payer: Self-pay | Admitting: *Deleted

## 2015-06-05 ENCOUNTER — Encounter: Payer: Self-pay | Admitting: Family Medicine

## 2015-06-05 VITALS — BP 124/60 | HR 56 | Temp 97.6°F | Resp 18

## 2015-06-05 DIAGNOSIS — I209 Angina pectoris, unspecified: Secondary | ICD-10-CM

## 2015-06-05 DIAGNOSIS — R002 Palpitations: Secondary | ICD-10-CM

## 2015-06-05 MED ORDER — NITROGLYCERIN 0.4 MG SL SUBL: 0.4000 mg | SUBLINGUAL_TABLET | SUBLINGUAL | Status: DC | PRN

## 2015-06-05 NOTE — Progress Notes (Signed)
Patient: Ivan Salazar Male    DOB: 09/14/1931   82 y.o.   MRN: [id number] Visit Date: 06/05/2015  Today's Provider: Lelon Huh, MD   Chief Complaint  Patient presents with  . Palpitations   Subjective:    Palpitations  This is a new problem. Episode onset: 3-5 days ago. The problem occurs intermittently. The problem has been unchanged. Associated symptoms include anxiety (more stressed lately with personal issues) and malaise/fatigue. Pertinent negatives include no chest fullness, coughing, diaphoresis, dizziness, fever, nausea, near-syncope, numbness, shortness of breath, syncope, vomiting or weakness.  Palpitations occur an average of 3 times a day, often in associated with physical exertion, particularly with PT that he recently started for frozen shoulder. It is associated with a tightness in his  Chest, just right of mid-line. No dyspnea.      Allergies  Allergen Reactions  . Celebrex [Celecoxib]     Noxius taste in mouth   Previous Medications   ATORVASTATIN (LIPITOR) 80 MG TABLET    TAKE 1 TABLET DAILY   CLOPIDOGREL (PLAVIX) 75 MG TABLET    TAKE 1 TABLET DAILY   ENALAPRIL (VASOTEC) 5 MG TABLET    Take 5 mg by mouth daily.    HYDROCODONE-ACETAMINOPHEN (NORCO) 7.5-325 MG TABLET    1 tablet every 4-6 hours as needed   LATANOPROST (XALATAN) 0.005 % OPHTHALMIC SOLUTION       METOPROLOL TARTRATE (LOPRESSOR) 25 MG TABLET    Take 1 tablet (25 mg total) by mouth 2 (two) times daily.   RANITIDINE (ZANTAC) 300 MG CAPSULE    Take by mouth.   TESTOSTERONE CYPIONATE (DEPOTESTOSTERONE CYPIONATE) 200 MG/ML INJECTION    Inject 1 mL (200 mg total) into the muscle every 28 (twenty-eight) days.   ZOLPIDEM (AMBIEN) 10 MG TABLET    TAKE ONE-HALF TO ONE TABLET EVERY NIGHT AT BEDTIME AS NEEDED    Review of Systems  Constitutional: Positive for malaise/fatigue and fatigue. Negative for fever, chills, diaphoresis and appetite change.  Respiratory: Negative for cough, chest tightness,  shortness of breath and wheezing.   Cardiovascular: Positive for palpitations. Negative for leg swelling, syncope and near-syncope.  Gastrointestinal: Negative for nausea, vomiting and abdominal pain.  Neurological: Negative for dizziness, weakness and numbness.  Psychiatric/Behavioral: The patient is nervous/anxious (more stressed lately with personal issues).     Social History  Substance Use Topics  . Smoking status: Former Smoker -- 0.50 packs/day for 20 years    Quit date: 05/01/1977  . Smokeless tobacco: Never Used  . Alcohol Use: Yes     Comment: moderate; 2 drinks daily   Objective:   BP 124/60 mmHg  Pulse 56  Temp(Src) 97.6 F (36.4 C) (Oral)  Resp 18  SpO2 100%  Physical Exam   General Appearance:    Alert, cooperative, no distress  Eyes:    PERRL, conjunctiva/corneas clear, EOM's intact       Lungs:     Clear to auscultation bilaterally, respirations unlabored  Heart:    Regular rate and rhythm  Neurologic:   Awake, alert, oriented x 3. No apparent focal neurological           defect.         Assessment & Plan:     1. Palpitations  - EKG 12-Lead - Renal function panel - Magnesium  2. AP (angina pectoris) ([place]) Unclear if this cardiac or musculo-skelatal as it is on the right side which is the same shoulder he has  been going to physical therapy for.  - Troponin I  He is scheduled to follow up with his cardiologist later this month.   He is given prescription nitroglycerine if he develops pain that does not resolve with rest.       Lelon Huh, MD  [place]

## 2015-06-05 NOTE — Telephone Encounter (Signed)
Patient called office to schedule appointment. Patient stated that he has been having palpitations intermittently for the few days and wanted to get checked by provider. Palpitations happen on average 3 times a day. Patient had no symptoms of pain, sob, lightheadedness, nausea. Scheduled appt for 2:45 today.

## 2015-06-06 LAB — RENAL FUNCTION PANEL
Albumin: 4.1 g/dL (ref 3.5–4.7)
BUN / CREAT RATIO: 14 (ref 10–22)
BUN: 16 mg/dL (ref 8–27)
CALCIUM: 8.8 mg/dL (ref 8.6–10.2)
CHLORIDE: 100 mmol/L (ref 96–106)
CO2: 26 mmol/L (ref 18–29)
Creatinine, Ser: 1.17 mg/dL (ref 0.76–1.27)
GFR calc Af Amer: 67 mL/min/{1.73_m2} (ref >59)
GFR calc non Af Amer: 58 mL/min/{1.73_m2} — ABNORMAL LOW (ref >59)
GLUCOSE: 110 mg/dL — AB (ref 65–99)
Phosphorus: 2.6 mg/dL (ref 2.5–4.5)
Potassium: 4.4 mmol/L (ref 3.5–5.2)
SODIUM: 141 mmol/L (ref 134–144)

## 2015-06-06 LAB — MAGNESIUM: MAGNESIUM: 2.1 mg/dL (ref 1.6–2.3)

## 2015-06-06 LAB — TROPONIN I

## 2015-06-08 ENCOUNTER — Encounter: Payer: Self-pay | Admitting: Family Medicine

## 2015-06-08 ENCOUNTER — Ambulatory Visit (INDEPENDENT_AMBULATORY_CARE_PROVIDER_SITE_OTHER): Payer: Medicare Other | Admitting: Family Medicine

## 2015-06-08 VITALS — BP 102/60 | HR 55 | Temp 98.2°F | Resp 16 | Ht 70.0 in | Wt 156.0 lb

## 2015-06-08 DIAGNOSIS — M7501 Adhesive capsulitis of right shoulder: Secondary | ICD-10-CM

## 2015-06-08 NOTE — Progress Notes (Signed)
Patient: Ivan Salazar Male    DOB: October 26, 1931   82 y.o.   MRN: [id number] Visit Date: 06/08/2015  Today's Provider: Lelon Huh, MD   Chief Complaint  Patient presents with  . Results   Subjective:    HPI  He is here today inquiring about results of labs drawn on 06-04-2014 when he was seen for vague right sided chest pains. All labs including troponin I were normal, and it is felt that pain is likely related to adhesive capsulitis of right shoulder. Pain started after PT was intensified a few weeks ago, and has no completely resolved since he stopped going to PT over the last several days. His next PT session is scheduled on 06-11-15 and he sees Dr. Sabra Heck on 06-10-15  Results for orders placed or performed in visit on 06/05/15  Troponin I  Result Value Ref Range   Troponin I <0.01 0.00 - 0.04 ng/mL  Renal function panel  Result Value Ref Range   Glucose 110 (H) 65 - 99 mg/dL   BUN 16 8 - 27 mg/dL   Creatinine, Ser 1.17 0.76 - 1.27 mg/dL   GFR calc non Af Amer 58 (L) >59 mL/min/1.73   GFR calc Af Amer 67 >59 mL/min/1.73   BUN/Creatinine Ratio 14 10 - 22   Sodium 141 134 - 144 mmol/L   Potassium 4.4 3.5 - 5.2 mmol/L   Chloride 100 96 - 106 mmol/L   CO2 26 18 - 29 mmol/L   Calcium 8.8 8.6 - 10.2 mg/dL   Phosphorus 2.6 2.5 - 4.5 mg/dL   Albumin 4.1 3.5 - 4.7 g/dL  Magnesium  Result Value Ref Range   Magnesium 2.1 1.6 - 2.3 mg/dL     Allergies  Allergen Reactions  . Celebrex [Celecoxib]     Noxius taste in mouth   Previous Medications   ATORVASTATIN (LIPITOR) 80 MG TABLET    TAKE 1 TABLET DAILY   CLOPIDOGREL (PLAVIX) 75 MG TABLET    TAKE 1 TABLET DAILY   ENALAPRIL (VASOTEC) 5 MG TABLET    Take 5 mg by mouth daily.    HYDROCODONE-ACETAMINOPHEN (NORCO) 7.5-325 MG TABLET    1 tablet every 4-6 hours as needed   LATANOPROST (XALATAN) 0.005 % OPHTHALMIC SOLUTION       METOPROLOL TARTRATE (LOPRESSOR) 25 MG TABLET    Take 1 tablet (25 mg total) by mouth 2 (two) times  daily.   NITROGLYCERIN (NITROSTAT) 0.4 MG SL TABLET    Place 1 tablet (0.4 mg total) under the tongue every 5 (five) minutes as needed for chest pain.   RANITIDINE (ZANTAC) 300 MG CAPSULE    Take by mouth.   TESTOSTERONE CYPIONATE (DEPOTESTOSTERONE CYPIONATE) 200 MG/ML INJECTION    Inject 1 mL (200 mg total) into the muscle every 28 (twenty-eight) days.   ZOLPIDEM (AMBIEN) 10 MG TABLET    TAKE ONE-HALF TO ONE TABLET EVERY NIGHT AT BEDTIME AS NEEDED    Review of Systems  Constitutional: Positive for fatigue. Negative for fever, chills and appetite change.  Respiratory: Negative for chest tightness, shortness of breath and wheezing.   Cardiovascular: Negative for chest pain and palpitations.  Gastrointestinal: Negative for nausea, vomiting and abdominal pain.    Social History  Substance Use Topics  . Smoking status: Former Smoker -- 0.50 packs/day for 20 years    Quit date: 05/01/1977  . Smokeless tobacco: Never Used  . Alcohol Use: Yes     Comment: moderate; 2  drinks daily   Objective:   BP 102/60 mmHg  Pulse 55  Temp(Src) 98.2 F (36.8 C) (Oral)  Resp 16  Ht 5\' 10"  (1.778 m)  Wt 156 lb (70.761 kg)  BMI 22.38 kg/m2  SpO2 98%  Physical Exam      Assessment & Plan:     Chest pain not  Consistent with cardiac pain. Likely related to adhesive and possible aggravated by PT. He is to discuss further with Dr. Sabra Heck on Thursday and follow with cardiology in 1 week as already scheduled.       Lelon Huh, MD  [facility]

## 2015-06-09 DIAGNOSIS — F4323 Adjustment disorder with mixed anxiety and depressed mood: Secondary | ICD-10-CM | POA: Diagnosis not present

## 2015-06-10 ENCOUNTER — Ambulatory Visit (INDEPENDENT_AMBULATORY_CARE_PROVIDER_SITE_OTHER): Payer: Medicare Other

## 2015-06-10 DIAGNOSIS — E291 Testicular hypofunction: Secondary | ICD-10-CM

## 2015-06-10 MED ORDER — TESTOSTERONE CYPIONATE 200 MG/ML IM SOLN
200.0000 mg | Freq: Once | INTRAMUSCULAR | Status: AC
  Administered 2015-06-10: 200 mg via INTRAMUSCULAR

## 2015-06-10 NOTE — Progress Notes (Signed)
Testosterone IM Injection  Due to Hypogonadism patient is present today for a Testosterone Injection.  Medication: Testosterone Cypionate Dose: 64mL Location: left upper outer buttocks Lot: 142132.1 Exp:08/2015  Patient tolerated well, no complications were noted  Preformed by: Toniann Fail, LPN   Follow up: 3 weeks

## 2015-06-15 DIAGNOSIS — E782 Mixed hyperlipidemia: Secondary | ICD-10-CM | POA: Diagnosis not present

## 2015-06-15 DIAGNOSIS — Z6822 Body mass index (BMI) 22.0-22.9, adult: Secondary | ICD-10-CM | POA: Diagnosis not present

## 2015-06-15 DIAGNOSIS — I2510 Atherosclerotic heart disease of native coronary artery without angina pectoris: Secondary | ICD-10-CM | POA: Diagnosis not present

## 2015-06-15 DIAGNOSIS — Z951 Presence of aortocoronary bypass graft: Secondary | ICD-10-CM | POA: Diagnosis not present

## 2015-06-16 DIAGNOSIS — M25612 Stiffness of left shoulder, not elsewhere classified: Secondary | ICD-10-CM | POA: Diagnosis not present

## 2015-06-16 DIAGNOSIS — M25512 Pain in left shoulder: Secondary | ICD-10-CM | POA: Diagnosis not present

## 2015-06-16 DIAGNOSIS — M25511 Pain in right shoulder: Secondary | ICD-10-CM | POA: Diagnosis not present

## 2015-06-16 DIAGNOSIS — M25611 Stiffness of right shoulder, not elsewhere classified: Secondary | ICD-10-CM | POA: Diagnosis not present

## 2015-06-18 DIAGNOSIS — M25612 Stiffness of left shoulder, not elsewhere classified: Secondary | ICD-10-CM | POA: Diagnosis not present

## 2015-06-18 DIAGNOSIS — M25512 Pain in left shoulder: Secondary | ICD-10-CM | POA: Diagnosis not present

## 2015-06-18 DIAGNOSIS — M25611 Stiffness of right shoulder, not elsewhere classified: Secondary | ICD-10-CM | POA: Diagnosis not present

## 2015-06-18 DIAGNOSIS — M25511 Pain in right shoulder: Secondary | ICD-10-CM | POA: Diagnosis not present

## 2015-06-23 DIAGNOSIS — M25512 Pain in left shoulder: Secondary | ICD-10-CM | POA: Diagnosis not present

## 2015-06-23 DIAGNOSIS — M25612 Stiffness of left shoulder, not elsewhere classified: Secondary | ICD-10-CM | POA: Diagnosis not present

## 2015-06-23 DIAGNOSIS — M25511 Pain in right shoulder: Secondary | ICD-10-CM | POA: Diagnosis not present

## 2015-06-23 DIAGNOSIS — M25611 Stiffness of right shoulder, not elsewhere classified: Secondary | ICD-10-CM | POA: Diagnosis not present

## 2015-06-24 ENCOUNTER — Ambulatory Visit: Payer: Self-pay

## 2015-06-24 ENCOUNTER — Encounter: Payer: Self-pay | Admitting: Family Medicine

## 2015-06-24 ENCOUNTER — Ambulatory Visit (INDEPENDENT_AMBULATORY_CARE_PROVIDER_SITE_OTHER): Payer: Medicare Other | Admitting: Family Medicine

## 2015-06-24 VITALS — BP 102/54 | HR 56 | Temp 97.8°F | Resp 16 | Wt 160.0 lb

## 2015-06-24 DIAGNOSIS — I209 Angina pectoris, unspecified: Secondary | ICD-10-CM | POA: Diagnosis not present

## 2015-06-24 DIAGNOSIS — H6121 Impacted cerumen, right ear: Secondary | ICD-10-CM | POA: Diagnosis not present

## 2015-06-24 NOTE — Progress Notes (Signed)
       Patient: Ivan Salazar Male    DOB: 09-30-1931   82 y.o.   MRN: [id number] Visit Date: 06/24/2015  Today's Provider: Lelon Huh, MD   Chief Complaint  Patient presents with  . Ear Fullness    in right ear x 2 weeks   Subjective:    HPI  Ear fullness:  Patient comes in today stating his right ear is stopped up and has been for the past 2 weeks. He states he had his ears irrigated recently due to earwax impaction. Patient denies any pain, decrease in hearing or drainage.    Allergies  Allergen Reactions  . Celebrex [Celecoxib]     Noxius taste in mouth   Previous Medications   ATORVASTATIN (LIPITOR) 80 MG TABLET    TAKE 1 TABLET DAILY   CLOPIDOGREL (PLAVIX) 75 MG TABLET    TAKE 1 TABLET DAILY   ENALAPRIL (VASOTEC) 5 MG TABLET    Take 5 mg by mouth daily.    HYDROCODONE-ACETAMINOPHEN (NORCO) 7.5-325 MG TABLET    1 tablet every 4-6 hours as needed   LATANOPROST (XALATAN) 0.005 % OPHTHALMIC SOLUTION       METOPROLOL TARTRATE (LOPRESSOR) 25 MG TABLET    Take 1 tablet (25 mg total) by mouth 2 (two) times daily.   NITROGLYCERIN (NITROSTAT) 0.4 MG SL TABLET    Place 1 tablet (0.4 mg total) under the tongue every 5 (five) minutes as needed for chest pain.   RANITIDINE (ZANTAC) 300 MG CAPSULE    Take by mouth.   TESTOSTERONE CYPIONATE (DEPOTESTOSTERONE CYPIONATE) 200 MG/ML INJECTION    Inject 1 mL (200 mg total) into the muscle every 28 (twenty-eight) days.   ZOLPIDEM (AMBIEN) 10 MG TABLET    TAKE ONE-HALF TO ONE TABLET EVERY NIGHT AT BEDTIME AS NEEDED    Review of Systems  Social History  Substance Use Topics  . Smoking status: Former Smoker -- 0.50 packs/day for 20 years    Quit date: 05/01/1977  . Smokeless tobacco: Never Used  . Alcohol Use: Yes     Comment: moderate; 2 drinks daily   Objective:   BP 102/54 mmHg  Pulse 56  Temp(Src) 97.8 F (36.6 C) (Oral)  Resp 16  Wt 160 lb (72.576 kg)  SpO2 97%  Physical Exam  General Appearance:    Alert, cooperative,  no distress  HENT:   neck without nodes and right TM obstructed by cerumen  Eyes:    PERRL, conjunctiva/corneas clear, EOM's intact               Assessment & Plan:     1. Cerumen impaction, right After soaking with Debrox, ear canal was irrigated with water until clear. Patient tolerated procedure well.         Lelon Huh, MD  [facility]

## 2015-06-25 DIAGNOSIS — M25611 Stiffness of right shoulder, not elsewhere classified: Secondary | ICD-10-CM | POA: Diagnosis not present

## 2015-06-25 DIAGNOSIS — M25512 Pain in left shoulder: Secondary | ICD-10-CM | POA: Diagnosis not present

## 2015-06-25 DIAGNOSIS — M25511 Pain in right shoulder: Secondary | ICD-10-CM | POA: Diagnosis not present

## 2015-06-25 DIAGNOSIS — M25612 Stiffness of left shoulder, not elsewhere classified: Secondary | ICD-10-CM | POA: Diagnosis not present

## 2015-06-26 ENCOUNTER — Telehealth: Payer: Self-pay | Admitting: Family Medicine

## 2015-06-26 NOTE — Telephone Encounter (Signed)
Pt request a nurse to return his call. Pt stated that he woke up this morning with a sore throat and he took a hydrocodone and it was helped. Pt wanted to know if he should do anything else. I advised that we can't treat over the phone and pt asked if a nurse would return his call. Thanks TNP

## 2015-06-26 NOTE — Telephone Encounter (Signed)
It the sore throat is from a cold/infection then salt water gargles and medication like ibuprofen helps. If sore throat is from post nasal drainage from allergies then allergy medication is a good choice. Sore throat from illness usually abates within 2 days.

## 2015-06-26 NOTE — Telephone Encounter (Signed)
Left message to call back  

## 2015-06-26 NOTE — Telephone Encounter (Signed)
Tried calling patient twice and line was busy. Will try again later.  

## 2015-06-26 NOTE — Telephone Encounter (Signed)
I spoke with patient, and his only complaint is a sore throat which is no longer there. He reports that it subsided when he took the hydrocodone. Patient reports that he also takes an allergy tablet (Loratadine) daily. Patient was wanting to know what other meds (OTC or prescription) could he take to help with the sore throat? [name].

## 2015-06-26 NOTE — Telephone Encounter (Signed)
Pt is returning call.  [id number]

## 2015-06-29 NOTE — Telephone Encounter (Signed)
[id number] 

## 2015-06-30 DIAGNOSIS — M6281 Muscle weakness (generalized): Secondary | ICD-10-CM | POA: Diagnosis not present

## 2015-07-01 ENCOUNTER — Ambulatory Visit (INDEPENDENT_AMBULATORY_CARE_PROVIDER_SITE_OTHER): Payer: Medicare Other

## 2015-07-01 ENCOUNTER — Other Ambulatory Visit: Payer: Self-pay | Admitting: Family Medicine

## 2015-07-01 DIAGNOSIS — E291 Testicular hypofunction: Secondary | ICD-10-CM | POA: Diagnosis not present

## 2015-07-01 MED ORDER — TESTOSTERONE CYPIONATE 200 MG/ML IM SOLN
200.0000 mg | Freq: Once | INTRAMUSCULAR | Status: AC
  Administered 2015-07-01: 200 mg via INTRAMUSCULAR

## 2015-07-01 NOTE — Telephone Encounter (Signed)
Rx called in to pharmacy. 

## 2015-07-01 NOTE — Progress Notes (Signed)
Testosterone IM Injection  Due to Hypogonadism patient is present today for a Testosterone Injection.  Medication: Testosterone Cypionate Dose: 21mL Location: right upper outer buttocks Lot: 142132.1 Exp:08/2015  Patient tolerated well, no complications were noted  Preformed by: Toniann Fail, LPN   Follow up: 3 weeks-pt also request to be made aware of refill status

## 2015-07-01 NOTE — Telephone Encounter (Signed)
Please call in zolpidem  

## 2015-07-03 DIAGNOSIS — M25611 Stiffness of right shoulder, not elsewhere classified: Secondary | ICD-10-CM | POA: Diagnosis not present

## 2015-07-03 DIAGNOSIS — M25612 Stiffness of left shoulder, not elsewhere classified: Secondary | ICD-10-CM | POA: Diagnosis not present

## 2015-07-03 DIAGNOSIS — M25511 Pain in right shoulder: Secondary | ICD-10-CM | POA: Diagnosis not present

## 2015-07-03 DIAGNOSIS — M25512 Pain in left shoulder: Secondary | ICD-10-CM | POA: Diagnosis not present

## 2015-07-09 DIAGNOSIS — M25511 Pain in right shoulder: Secondary | ICD-10-CM | POA: Diagnosis not present

## 2015-07-09 DIAGNOSIS — M25612 Stiffness of left shoulder, not elsewhere classified: Secondary | ICD-10-CM | POA: Diagnosis not present

## 2015-07-09 DIAGNOSIS — M25611 Stiffness of right shoulder, not elsewhere classified: Secondary | ICD-10-CM | POA: Diagnosis not present

## 2015-07-09 DIAGNOSIS — M25512 Pain in left shoulder: Secondary | ICD-10-CM | POA: Diagnosis not present

## 2015-07-13 DIAGNOSIS — M25611 Stiffness of right shoulder, not elsewhere classified: Secondary | ICD-10-CM | POA: Diagnosis not present

## 2015-07-13 DIAGNOSIS — M25512 Pain in left shoulder: Secondary | ICD-10-CM | POA: Diagnosis not present

## 2015-07-13 DIAGNOSIS — M25511 Pain in right shoulder: Secondary | ICD-10-CM | POA: Diagnosis not present

## 2015-07-13 DIAGNOSIS — M25612 Stiffness of left shoulder, not elsewhere classified: Secondary | ICD-10-CM | POA: Diagnosis not present

## 2015-07-17 DIAGNOSIS — M6281 Muscle weakness (generalized): Secondary | ICD-10-CM | POA: Diagnosis not present

## 2015-07-21 DIAGNOSIS — M25612 Stiffness of left shoulder, not elsewhere classified: Secondary | ICD-10-CM | POA: Diagnosis not present

## 2015-07-21 DIAGNOSIS — M25512 Pain in left shoulder: Secondary | ICD-10-CM | POA: Diagnosis not present

## 2015-07-21 DIAGNOSIS — M25611 Stiffness of right shoulder, not elsewhere classified: Secondary | ICD-10-CM | POA: Diagnosis not present

## 2015-07-21 DIAGNOSIS — M25511 Pain in right shoulder: Secondary | ICD-10-CM | POA: Diagnosis not present

## 2015-07-22 ENCOUNTER — Ambulatory Visit: Payer: Medicare Other

## 2015-07-23 DIAGNOSIS — M25511 Pain in right shoulder: Secondary | ICD-10-CM | POA: Diagnosis not present

## 2015-07-23 DIAGNOSIS — M25612 Stiffness of left shoulder, not elsewhere classified: Secondary | ICD-10-CM | POA: Diagnosis not present

## 2015-07-23 DIAGNOSIS — M25611 Stiffness of right shoulder, not elsewhere classified: Secondary | ICD-10-CM | POA: Diagnosis not present

## 2015-07-23 DIAGNOSIS — M25512 Pain in left shoulder: Secondary | ICD-10-CM | POA: Diagnosis not present

## 2015-07-24 ENCOUNTER — Ambulatory Visit (INDEPENDENT_AMBULATORY_CARE_PROVIDER_SITE_OTHER): Payer: Medicare Other

## 2015-07-24 DIAGNOSIS — E291 Testicular hypofunction: Secondary | ICD-10-CM

## 2015-07-24 MED ORDER — TESTOSTERONE CYPIONATE 200 MG/ML IM SOLN
200.0000 mg | Freq: Once | INTRAMUSCULAR | Status: AC
  Administered 2015-07-24: 200 mg via INTRAMUSCULAR

## 2015-07-24 NOTE — Progress Notes (Signed)
Testosterone IM Injection  Due to Hypogonadism patient is present today for a Testosterone Injection.  Medication: Testosterone Cypionate Dose: 32mL Location: right upper outer buttocks Lot: 142132.1 Exp:08/2015  Patient tolerated well, no complications were noted  Preformed by: Toniann Fail, LPN

## 2015-07-28 DIAGNOSIS — M75121 Complete rotator cuff tear or rupture of right shoulder, not specified as traumatic: Secondary | ICD-10-CM | POA: Diagnosis not present

## 2015-07-29 ENCOUNTER — Other Ambulatory Visit: Payer: Self-pay | Admitting: Family Medicine

## 2015-07-29 DIAGNOSIS — H40003 Preglaucoma, unspecified, bilateral: Secondary | ICD-10-CM | POA: Diagnosis not present

## 2015-07-29 DIAGNOSIS — M5137 Other intervertebral disc degeneration, lumbosacral region: Secondary | ICD-10-CM

## 2015-07-29 MED ORDER — HYDROCODONE-ACETAMINOPHEN 7.5-325 MG PO TABS
ORAL_TABLET | ORAL | Status: DC
Start: 2015-07-29 — End: 2015-07-29

## 2015-07-29 MED ORDER — HYDROCODONE-ACETAMINOPHEN 7.5-325 MG PO TABS: ORAL_TABLET | ORAL | Status: DC

## 2015-07-29 NOTE — Telephone Encounter (Signed)
Pt requesting 3 prescriptions of his HYDROcodone-acetaminophen (NORCO) 7.5-325 MG tablet.  Please call pt when ready.  Thanks CC

## 2015-07-30 DIAGNOSIS — M25511 Pain in right shoulder: Secondary | ICD-10-CM | POA: Diagnosis not present

## 2015-07-30 DIAGNOSIS — M25512 Pain in left shoulder: Secondary | ICD-10-CM | POA: Diagnosis not present

## 2015-07-30 DIAGNOSIS — M25612 Stiffness of left shoulder, not elsewhere classified: Secondary | ICD-10-CM | POA: Diagnosis not present

## 2015-07-30 DIAGNOSIS — M25611 Stiffness of right shoulder, not elsewhere classified: Secondary | ICD-10-CM | POA: Diagnosis not present

## 2015-08-03 DIAGNOSIS — M25611 Stiffness of right shoulder, not elsewhere classified: Secondary | ICD-10-CM | POA: Diagnosis not present

## 2015-08-03 DIAGNOSIS — M25511 Pain in right shoulder: Secondary | ICD-10-CM | POA: Diagnosis not present

## 2015-08-03 DIAGNOSIS — M25612 Stiffness of left shoulder, not elsewhere classified: Secondary | ICD-10-CM | POA: Diagnosis not present

## 2015-08-03 DIAGNOSIS — M25512 Pain in left shoulder: Secondary | ICD-10-CM | POA: Diagnosis not present

## 2015-08-04 ENCOUNTER — Other Ambulatory Visit: Payer: Self-pay | Admitting: Family Medicine

## 2015-08-04 DIAGNOSIS — F4323 Adjustment disorder with mixed anxiety and depressed mood: Secondary | ICD-10-CM | POA: Diagnosis not present

## 2015-08-05 ENCOUNTER — Emergency Department: Payer: Medicare Other

## 2015-08-05 ENCOUNTER — Emergency Department
Admission: EM | Admit: 2015-08-05 | Discharge: 2015-08-07 | Disposition: A | Discharge status: Home / Self Care | Payer: Medicare Other | Attending: Emergency Medicine | Admitting: Emergency Medicine

## 2015-08-05 ENCOUNTER — Encounter: Payer: Self-pay | Admitting: Urgent Care

## 2015-08-05 DIAGNOSIS — Y929 Unspecified place or not applicable: Secondary | ICD-10-CM | POA: Diagnosis not present

## 2015-08-05 DIAGNOSIS — T18128A Food in esophagus causing other injury, initial encounter: Secondary | ICD-10-CM | POA: Diagnosis not present

## 2015-08-05 DIAGNOSIS — Y999 Unspecified external cause status: Secondary | ICD-10-CM | POA: Diagnosis not present

## 2015-08-05 DIAGNOSIS — X58XXXA Exposure to other specified factors, initial encounter: Secondary | ICD-10-CM | POA: Insufficient documentation

## 2015-08-05 DIAGNOSIS — Y9389 Activity, other specified: Secondary | ICD-10-CM | POA: Insufficient documentation

## 2015-08-05 DIAGNOSIS — H40003 Preglaucoma, unspecified, bilateral: Secondary | ICD-10-CM | POA: Diagnosis not present

## 2015-08-05 DIAGNOSIS — E785 Hyperlipidemia, unspecified: Secondary | ICD-10-CM | POA: Insufficient documentation

## 2015-08-05 DIAGNOSIS — Z87891 Personal history of nicotine dependence: Secondary | ICD-10-CM | POA: Diagnosis not present

## 2015-08-05 DIAGNOSIS — I10 Essential (primary) hypertension: Secondary | ICD-10-CM | POA: Insufficient documentation

## 2015-08-05 DIAGNOSIS — I2510 Atherosclerotic heart disease of native coronary artery without angina pectoris: Secondary | ICD-10-CM | POA: Diagnosis not present

## 2015-08-05 DIAGNOSIS — R918 Other nonspecific abnormal finding of lung field: Secondary | ICD-10-CM | POA: Diagnosis not present

## 2015-08-05 DIAGNOSIS — T189XXA Foreign body of alimentary tract, part unspecified, initial encounter: Secondary | ICD-10-CM | POA: Diagnosis present

## 2015-08-05 MED ORDER — GLUCAGON HCL (RDNA) 1 MG IJ SOLR
1.0000 mg | Freq: Once | INTRAMUSCULAR | Status: AC
  Administered 2015-08-05: 1 mg via INTRAVENOUS
  Filled 2015-08-05: qty 1

## 2015-08-05 MED ORDER — NITROGLYCERIN 0.4 MG SL SUBL
0.4000 mg | SUBLINGUAL_TABLET | Freq: Once | SUBLINGUAL | Status: AC
  Administered 2015-08-05: 0.4 mg via SUBLINGUAL
  Filled 2015-08-05: qty 1

## 2015-08-05 MED ORDER — GLUCAGON HCL RDNA (DIAGNOSTIC) 1 MG IJ SOLR
INTRAMUSCULAR | Status: AC
  Filled 2015-08-05: qty 1

## 2015-08-05 NOTE — ED Notes (Addendum)
Patient presents with an esophageal foreign body. Reports that he was eating steak and it became lodged in his throat. PMH significant for the same; resolved spontaneously. NAD noted at this time.

## 2015-08-05 NOTE — ED Provider Notes (Signed)
[facility] Emergency Department Provider Note  ____________________________________________  Time seen: Approximately [id number] PM  I have reviewed the triage vital signs and the nursing notes.   HISTORY  Chief Complaint Swallowed Foreign Body   HPI Ivan Salazar is a 82 y.o. male with a history of coronary artery disease was presenting to the emergency department tonight after feeling of getting salad and steak stuck in his esophagus. He says he was eating a salad and there was a small chunk of steak mixed in with the salad. He said that upon swallowing he felt a pressure-like chest pain as other was stuck in his chest. He says that he coughed as a reflex after this pain with the pain has not improved. Instead, it moves lower to the chest at the distal end of the sternum. It is nonradiating. He has no nausea or shortness of breath associated. He said that he has similar episode about 6 months ago which resolved after several minutes.   Past Medical History  Diagnosis Date  . Hypogonadism in male   . Impotence   . Nocturia   . CAD (coronary artery disease)   . Carpal tunnel syndrome   . HLD (hyperlipidemia)   . BPH (benign prostatic hyperplasia)   . DDD (degenerative disc disease), cervical     Patient Active Problem List   Diagnosis Date Noted  . Hypogonadism in male 03/09/2015  . BPH with obstruction/lower urinary tract symptoms 03/09/2015  . Loss of weight 03/05/2015  . Tietze's disease 03/03/2015  . Actinic keratoses 10/22/2014  . DDD (degenerative disc disease), lumbosacral 10/22/2014  . Acid reflux 10/22/2014  . Testicular hypofunction 06/27/2012  . Bursitis of hip 01/18/2011  . Family history of colonic polyps 12/24/2007  . CAD in native artery 08/16/2006  . H/O coronary artery bypass surgery 08/16/2006  . Anemia, iron deficiency 09/23/2005  . Cardiac enlargement 07/20/2005  . HLD (hyperlipidemia) 05/02/1998  . Insomnia, persistent 05/02/1998   . Essential (primary) hypertension 05/02/1998    Past Surgical History  Procedure Laterality Date  . Cardiac catheterization  2004  . Coronary stent placement  2005  . Bilateral carpal tunnel release      05/12/2011, 06/09/2011  . Upper gi endoscopy  08/02/2010    Dr. Tedra Coupe, gastritis. H Pylori negative  . Coronary artery bypass graft  2007    5; [facility]    Current Outpatient Rx  Name  Route  Sig  Dispense  Refill  . atorvastatin (LIPITOR) 80 MG tablet      TAKE 1 TABLET DAILY   90 tablet   3   . clopidogrel (PLAVIX) 75 MG tablet      TAKE 1 TABLET DAILY   90 tablet   4     CYCLE FILL MEDICATION. Authorization is required f ...   . enalapril (VASOTEC) 5 MG tablet   Oral   Take 5 mg by mouth daily.          Marland Kitchen HYDROcodone-acetaminophen (NORCO) 7.5-325 MG tablet      1 tablet every 4-6 hours as needed   180 tablet   0     May fill on or after June 4th, 2017   . latanoprost (XALATAN) 0.005 % ophthalmic solution               . metoprolol tartrate (LOPRESSOR) 25 MG tablet   Oral   Take 1 tablet (25 mg total) by mouth 2 (two) times daily.   180 tablet  3   . nitroGLYCERIN (NITROSTAT) 0.4 MG SL tablet   Sublingual   Place 1 tablet (0.4 mg total) under the tongue every 5 (five) minutes as needed for chest pain.   20 tablet   1   . ranitidine (ZANTAC) 300 MG tablet      TAKE 1 TABLET TWICE DAILY   180 tablet   4     Rx has expired - no refills remain   . testosterone cypionate (DEPOTESTOSTERONE CYPIONATE) 200 MG/ML injection   Intramuscular   Inject 1 mL (200 mg total) into the muscle every 28 (twenty-eight) days.   10 mL   3   . zolpidem (AMBIEN) 10 MG tablet      TAKE ONE-HALF TO ONE TABLET BY MOUTH AT BEDTIME AS NEEDED   30 tablet   4     CYCLE FILL MEDICATION. Authorization is required f ...     Allergies Celebrex  Family History  Problem Relation Age of Onset  . Cirrhosis Father     of liver    Social History Social  History  Substance Use Topics  . Smoking status: Former Smoker -- 0.50 packs/day for 20 years    Quit date: 05/01/1977  . Smokeless tobacco: Never Used  . Alcohol Use: Yes     Comment: moderate; 2 drinks daily    Review of Systems Constitutional: No fever/chills Eyes: No visual changes. ENT: No sore throat. Cardiovascular: As above Respiratory: Denies shortness of breath. Gastrointestinal: No abdominal pain.  No nausea, no vomiting.  No diarrhea.  No constipation. Genitourinary: Negative for dysuria. Musculoskeletal: Negative for back pain. Skin: Negative for rash. Neurological: Negative for headaches, focal weakness or numbness.  10-point ROS otherwise negative.  ____________________________________________   PHYSICAL EXAM:  VITAL SIGNS: ED Triage Vitals  Enc Vitals Group     BP 08/05/15 1957 178/68 mmHg     Pulse Rate 08/05/15 1955 62     Resp 08/05/15 1955 18     Temp 08/05/15 1955 97.9 F (36.6 C)     Temp Source 08/05/15 1955 Oral     SpO2 08/05/15 1955 98 %     Weight 08/05/15 1955 160 lb (72.576 kg)     Height 08/05/15 1955 5\' 10"  (1.778 m)     Head Cir --      Peak Flow --      Pain Score 08/05/15 1956 0     Pain Loc --      Pain Edu? --      Excl. in [place]? --     Constitutional: Alert and oriented. Well appearing and in no acute distress. Eyes: Conjunctivae are normal. PERRL. EOMI. Head: Atraumatic. Nose: No congestion/rhinnorhea. Mouth/Throat: Mucous membranes are moist.  Oropharynx non-erythematous. Neck: No stridor.   Cardiovascular: Normal rate, regular rhythm. Grossly normal heart sounds.  Good peripheral circulation. Respiratory: Normal respiratory effort.  No retractions. Lungs CTAB. Gastrointestinal: Soft and nontender. No distention.  Musculoskeletal: No lower extremity tenderness nor edema.  No joint effusions. Neurologic:  Normal speech and language. No gross focal neurologic deficits are appreciated. No gait instability. Skin:  Skin is  warm, dry and intact. No rash noted. Psychiatric: Mood and affect are normal. Speech and behavior are normal.  ____________________________________________   LABS (all labs ordered are listed, but only abnormal results are displayed)  Labs Reviewed - No data to display ____________________________________________  EKG   ____________________________________________  RADIOLOGY  No acute finding on the chest x-ray. ____________________________________________   PROCEDURES  ____________________________________________   INITIAL IMPRESSION / ASSESSMENT AND PLAN / ED COURSE  Pertinent labs & imaging results that were available during my care of the patient were reviewed by me and considered in my medical decision making (see chart for details).  ----------------------------------------- 8:48 PM on 08/05/2015 -----------------------------------------  After initially swallowing [employer] the patient had chest pain. We then gave glucagon and nitroglycerin. The pain is now resolved. He is able to drink and swallow without any pain. Had a brief episode of nausea after the glucagon which is now resolved. Patient says that his symptoms are completely relieved. Likely a resolved food impaction. ____________________________________________   FINAL CLINICAL IMPRESSION(S) / ED DIAGNOSES  Food impaction.    Orbie Pyo, MD 08/05/15 2049

## 2015-08-06 DIAGNOSIS — M25612 Stiffness of left shoulder, not elsewhere classified: Secondary | ICD-10-CM | POA: Diagnosis not present

## 2015-08-06 DIAGNOSIS — M25512 Pain in left shoulder: Secondary | ICD-10-CM | POA: Diagnosis not present

## 2015-08-06 DIAGNOSIS — M25611 Stiffness of right shoulder, not elsewhere classified: Secondary | ICD-10-CM | POA: Diagnosis not present

## 2015-08-06 DIAGNOSIS — M25511 Pain in right shoulder: Secondary | ICD-10-CM | POA: Diagnosis not present

## 2015-08-10 DIAGNOSIS — M25611 Stiffness of right shoulder, not elsewhere classified: Secondary | ICD-10-CM | POA: Diagnosis not present

## 2015-08-10 DIAGNOSIS — M25612 Stiffness of left shoulder, not elsewhere classified: Secondary | ICD-10-CM | POA: Diagnosis not present

## 2015-08-10 DIAGNOSIS — M25512 Pain in left shoulder: Secondary | ICD-10-CM | POA: Diagnosis not present

## 2015-08-10 DIAGNOSIS — M25511 Pain in right shoulder: Secondary | ICD-10-CM | POA: Diagnosis not present

## 2015-08-12 ENCOUNTER — Ambulatory Visit: Payer: Medicare Other

## 2015-08-12 ENCOUNTER — Encounter: Payer: Self-pay | Admitting: Urology

## 2015-08-18 DIAGNOSIS — M25612 Stiffness of left shoulder, not elsewhere classified: Secondary | ICD-10-CM | POA: Diagnosis not present

## 2015-08-18 DIAGNOSIS — M25511 Pain in right shoulder: Secondary | ICD-10-CM | POA: Diagnosis not present

## 2015-08-18 DIAGNOSIS — M25512 Pain in left shoulder: Secondary | ICD-10-CM | POA: Diagnosis not present

## 2015-08-18 DIAGNOSIS — M25611 Stiffness of right shoulder, not elsewhere classified: Secondary | ICD-10-CM | POA: Diagnosis not present

## 2015-08-19 ENCOUNTER — Ambulatory Visit (INDEPENDENT_AMBULATORY_CARE_PROVIDER_SITE_OTHER): Payer: Medicare Other

## 2015-08-19 DIAGNOSIS — E291 Testicular hypofunction: Secondary | ICD-10-CM

## 2015-08-19 MED ORDER — TESTOSTERONE CYPIONATE 200 MG/ML IM SOLN
200.0000 mg | Freq: Once | INTRAMUSCULAR | Status: AC
  Administered 2015-08-19: 200 mg via INTRAMUSCULAR

## 2015-08-19 NOTE — Progress Notes (Signed)
Testosterone IM Injection  Due to Hypogonadism patient is present today for a Testosterone Injection.  Medication: Testosterone Cypionate Dose: 34mL Location: left upper outer buttocks Lot: 142132.1 Exp:08/2015  Patient tolerated well, no complications were noted  Preformed by: Toniann Fail, LPN   Follow up: 3 weeks

## 2015-08-25 ENCOUNTER — Other Ambulatory Visit: Payer: Self-pay | Admitting: Family Medicine

## 2015-08-25 DIAGNOSIS — M25511 Pain in right shoulder: Secondary | ICD-10-CM | POA: Diagnosis not present

## 2015-08-25 DIAGNOSIS — M25512 Pain in left shoulder: Secondary | ICD-10-CM | POA: Diagnosis not present

## 2015-08-25 DIAGNOSIS — M25612 Stiffness of left shoulder, not elsewhere classified: Secondary | ICD-10-CM | POA: Diagnosis not present

## 2015-08-25 DIAGNOSIS — M25611 Stiffness of right shoulder, not elsewhere classified: Secondary | ICD-10-CM | POA: Diagnosis not present

## 2015-08-26 DIAGNOSIS — H532 Diplopia: Secondary | ICD-10-CM | POA: Diagnosis not present

## 2015-08-27 ENCOUNTER — Encounter: Payer: Self-pay | Admitting: Family Medicine

## 2015-08-27 ENCOUNTER — Ambulatory Visit (INDEPENDENT_AMBULATORY_CARE_PROVIDER_SITE_OTHER): Payer: Medicare Other | Admitting: Family Medicine

## 2015-08-27 VITALS — BP 104/52 | HR 58 | Temp 98.1°F | Resp 16 | Ht 70.0 in | Wt 164.0 lb

## 2015-08-27 DIAGNOSIS — I209 Angina pectoris, unspecified: Secondary | ICD-10-CM | POA: Diagnosis not present

## 2015-08-27 DIAGNOSIS — R1310 Dysphagia, unspecified: Secondary | ICD-10-CM | POA: Diagnosis not present

## 2015-08-27 NOTE — Progress Notes (Signed)
Patient: Ivan Salazar Male    DOB: 09-06-1931   82 y.o.   MRN: [id number] Visit Date: 08/27/2015  Today's Provider: Lelon Huh, MD   Chief Complaint  Patient presents with  . Hospitalization Follow-up   Subjective:    HPI   Follow up ER visit  Patient was seen in ER for [facility] on 08/05/2015. He was treated for swallowed foreign body and food impaction. He was eating salad and steak at the [facility] [employer] when piece of steak got stuck in throat then lower in chest.  Treatment for this included;gave glucagon and nitroglycerin. The pain resolved and he was able to drink and swallow without any pain. Advised to follow-up with pcp in 1 week.. States he sometimes has trouble swallowing pills.  He reports good compliance with treatment. He reports this condition is Improved.  He had EGD in 2009 and and 2012 by Dr. [name] with findings of esophagitis and Schatzki ring at GE junction.   He continues on daily ranitidine and states he is not having any heartburn.  ----------------------------------------------------------------------      Allergies  Allergen Reactions  . Celebrex [Celecoxib]     Noxius taste in mouth   Previous Medications   ATORVASTATIN (LIPITOR) 80 MG TABLET    TAKE 1 TABLET DAILY   CLOPIDOGREL (PLAVIX) 75 MG TABLET    TAKE 1 TABLET DAILY   ENALAPRIL (VASOTEC) 5 MG TABLET    Take 5 mg by mouth daily.    HYDROCODONE-ACETAMINOPHEN (NORCO) 7.5-325 MG TABLET    1 tablet every 4-6 hours as needed   LATANOPROST (XALATAN) 0.005 % OPHTHALMIC SOLUTION       METOPROLOL TARTRATE (LOPRESSOR) 25 MG TABLET    TAKE 1 TABLET (25 MG TOTAL) BY MOUTH 2 (TWO) TIMES DAILY.   NITROGLYCERIN (NITROSTAT) 0.4 MG SL TABLET    Place 1 tablet (0.4 mg total) under the tongue every 5 (five) minutes as needed for chest pain.   RANITIDINE (ZANTAC) 300 MG TABLET    TAKE 1 TABLET TWICE DAILY   TESTOSTERONE CYPIONATE (DEPOTESTOSTERONE CYPIONATE) 200 MG/ML INJECTION    Inject 1 mL (200 mg total)  into the muscle every 28 (twenty-eight) days.   ZOLPIDEM (AMBIEN) 10 MG TABLET    TAKE ONE-HALF TO ONE TABLET BY MOUTH AT BEDTIME AS NEEDED    Review of Systems  Constitutional: Negative for fever, chills and appetite change.  Eyes: Positive for visual disturbance (Was seen at [facility] for 2 episodes of double visiton while traveling, and episoded were attributed to extreme fatigue. ).  Respiratory: Negative for chest tightness, shortness of breath and wheezing.   Cardiovascular: Negative for chest pain and palpitations.  Gastrointestinal: Negative for nausea, vomiting and abdominal pain.    Social History  Substance Use Topics  . Smoking status: Former Smoker -- 0.50 packs/day for 20 years    Quit date: 05/01/1977  . Smokeless tobacco: Never Used  . Alcohol Use: Yes     Comment: moderate; 2 drinks daily   Objective:   BP 104/52 mmHg  Pulse 58  Temp(Src) 98.1 F (36.7 C) (Oral)  Resp 16  Ht 5\' 10"  (1.778 m)  Wt 164 lb (74.39 kg)  BMI 23.53 kg/m2  Physical Exam   General Appearance:    Alert, cooperative, no distress  Eyes:    PERRL, conjunctiva/corneas clear, EOM's intact       Lungs:     Clear to auscultation bilaterally, respirations unlabored  Heart:  Regular rate and rhythm  Neurologic:   Awake, alert, oriented x 3. No apparent focal neurological           defect.           Assessment & Plan:     1. Dysphagia Likely due to trying to swallow to large of a bolus, but he does have history of esophagitis and mild Schatzki ring on EGD.  - DG UGI W/Water Sol Cm; Future       Lelon Huh, MD  [facility]

## 2015-08-31 ENCOUNTER — Other Ambulatory Visit: Payer: Self-pay | Admitting: Family Medicine

## 2015-08-31 ENCOUNTER — Ambulatory Visit
Admission: RE | Admit: 2015-08-31 | Discharge: 2015-08-31 | Disposition: A | Discharge status: Home / Self Care | Payer: Medicare Other | Source: Ambulatory Visit | Attending: Family Medicine | Admitting: Family Medicine

## 2015-08-31 DIAGNOSIS — R1310 Dysphagia, unspecified: Secondary | ICD-10-CM | POA: Insufficient documentation

## 2015-08-31 DIAGNOSIS — K449 Diaphragmatic hernia without obstruction or gangrene: Secondary | ICD-10-CM | POA: Insufficient documentation

## 2015-08-31 DIAGNOSIS — M25611 Stiffness of right shoulder, not elsewhere classified: Secondary | ICD-10-CM | POA: Diagnosis not present

## 2015-08-31 DIAGNOSIS — M25511 Pain in right shoulder: Secondary | ICD-10-CM | POA: Diagnosis not present

## 2015-08-31 DIAGNOSIS — M25512 Pain in left shoulder: Secondary | ICD-10-CM | POA: Diagnosis not present

## 2015-08-31 DIAGNOSIS — M25612 Stiffness of left shoulder, not elsewhere classified: Secondary | ICD-10-CM | POA: Diagnosis not present

## 2015-09-03 ENCOUNTER — Ambulatory Visit (INDEPENDENT_AMBULATORY_CARE_PROVIDER_SITE_OTHER): Payer: Medicare Other | Admitting: Family Medicine

## 2015-09-03 ENCOUNTER — Encounter: Payer: Self-pay | Admitting: Family Medicine

## 2015-09-03 VITALS — BP 110/60 | HR 52 | Temp 97.6°F | Resp 16 | Ht 70.0 in | Wt 162.0 lb

## 2015-09-03 DIAGNOSIS — M25511 Pain in right shoulder: Secondary | ICD-10-CM | POA: Diagnosis not present

## 2015-09-03 DIAGNOSIS — M25512 Pain in left shoulder: Secondary | ICD-10-CM | POA: Diagnosis not present

## 2015-09-03 DIAGNOSIS — Q394 Esophageal web: Secondary | ICD-10-CM

## 2015-09-03 DIAGNOSIS — H6981 Other specified disorders of Eustachian tube, right ear: Secondary | ICD-10-CM | POA: Diagnosis not present

## 2015-09-03 DIAGNOSIS — D126 Benign neoplasm of colon, unspecified: Secondary | ICD-10-CM

## 2015-09-03 DIAGNOSIS — I209 Angina pectoris, unspecified: Secondary | ICD-10-CM

## 2015-09-03 DIAGNOSIS — M25612 Stiffness of left shoulder, not elsewhere classified: Secondary | ICD-10-CM | POA: Diagnosis not present

## 2015-09-03 DIAGNOSIS — M25611 Stiffness of right shoulder, not elsewhere classified: Secondary | ICD-10-CM | POA: Diagnosis not present

## 2015-09-03 DIAGNOSIS — K222 Esophageal obstruction: Secondary | ICD-10-CM | POA: Insufficient documentation

## 2015-09-03 DIAGNOSIS — H6980 Other specified disorders of Eustachian tube, unspecified ear: Secondary | ICD-10-CM | POA: Insufficient documentation

## 2015-09-03 MED ORDER — FLUTICASONE PROPIONATE 50 MCG/ACT NA SUSP: 2.0000 | Freq: Every day | NASAL | Status: DC

## 2015-09-03 NOTE — Patient Instructions (Signed)
You are due for follow up with Dr. [name] this year for colon polyps and reflux.

## 2015-09-03 NOTE — Progress Notes (Signed)
Patient: Ivan Salazar Male    DOB: February 07, 1932   82 y.o.   MRN: [id number] Visit Date: 09/03/2015  Today's Provider: Lelon Huh, MD   Chief Complaint  Patient presents with  . Follow-up  . Dysphagia  . Cerumen Impaction   Subjective:    HPI  Follow-up for dysphagia from 08/27/2015  Dg Chest 1 View  08/31/2015  CLINICAL DATA:  Dysphagia.  EXAM: ESOPHOGRAM / BARIUM SWALLOW / BARIUM TABLET STUDY TECHNIQUE:   IMPRESSION: Small hiatal hernia with slightly prominent esophageal B ring. Esophageal B ring results and delayed passage of standardized barium tablet. Electronically Signed   By: [place]   On: 08/31/2015 11:02    Right ear feels stopped-up for the last couple of weeks. Has persistent nasal congestion. Is taking Claritin  Allergies  Allergen Reactions  . Celebrex [Celecoxib]     Noxius taste in mouth   Previous Medications   ATORVASTATIN (LIPITOR) 80 MG TABLET    TAKE 1 TABLET DAILY   CLOPIDOGREL (PLAVIX) 75 MG TABLET    TAKE 1 TABLET DAILY   ENALAPRIL (VASOTEC) 5 MG TABLET    Take 5 mg by mouth daily.    HYDROCODONE-ACETAMINOPHEN (NORCO) 7.5-325 MG TABLET    1 tablet every 4-6 hours as needed   LATANOPROST (XALATAN) 0.005 % OPHTHALMIC SOLUTION       METOPROLOL TARTRATE (LOPRESSOR) 25 MG TABLET    TAKE 1 TABLET (25 MG TOTAL) BY MOUTH 2 (TWO) TIMES DAILY.   NITROGLYCERIN (NITROSTAT) 0.4 MG SL TABLET    Place 1 tablet (0.4 mg total) under the tongue every 5 (five) minutes as needed for chest pain.   RANITIDINE (ZANTAC) 300 MG TABLET    TAKE 1 TABLET TWICE DAILY   TESTOSTERONE CYPIONATE (DEPOTESTOSTERONE CYPIONATE) 200 MG/ML INJECTION    Inject 1 mL (200 mg total) into the muscle every 28 (twenty-eight) days.   ZOLPIDEM (AMBIEN) 10 MG TABLET    TAKE ONE-HALF TO ONE TABLET BY MOUTH AT BEDTIME AS NEEDED    Review of Systems  Constitutional: Negative for fever, chills and appetite change.  Respiratory: Negative for chest tightness, shortness of breath and  wheezing.   Cardiovascular: Negative for chest pain and palpitations.  Gastrointestinal: Negative for nausea, vomiting and abdominal pain.    Social History  Substance Use Topics  . Smoking status: Former Smoker -- 0.50 packs/day for 20 years    Quit date: 05/01/1977  . Smokeless tobacco: Never Used  . Alcohol Use: Yes     Comment: moderate; 2 drinks daily   Objective:   BP 110/60 mmHg  Pulse 52  Temp(Src) 97.6 F (36.4 C) (Oral)  Resp 16  Ht 5\' 10"  (1.778 m)  Wt 162 lb (73.483 kg)  BMI 23.24 kg/m2  SpO2 98%  Physical Exam  General Appearance:    Alert, cooperative, no distress  HENT:   right TM fluid noted, neck without nodes and nasal mucosa pale and congested. Canals patent.   Eyes:    PERRL, conjunctiva/corneas clear, EOM's intact       Lungs:     Clear to auscultation bilaterally, respirations unlabored  Heart:    Regular rate and rhythm  Neurologic:   Awake, alert, oriented x 3. No apparent focal neurological           defect.           Assessment & Plan:     1. Eustachian tube dysfunction, right I don't thinks  this is related to dysphagia that results in ER visit last month, but advised patient it may present problem in the future, and could complicated evaluation of his underlying heart disease. Advised should follow up with Dr. [name] in the intermediate future for this.   2. Schatzki's ring No reflux symptoms at this point on twice a day ranitidine.   3. Adenomatous polyp of colon Is due for follow up colonoscopy. Advised we will arrange for referral to Dr. [name] if he doesn't receive a reminder letter soon.         Lelon Huh, MD  [facility]

## 2015-09-07 DIAGNOSIS — M25611 Stiffness of right shoulder, not elsewhere classified: Secondary | ICD-10-CM | POA: Diagnosis not present

## 2015-09-07 DIAGNOSIS — M25512 Pain in left shoulder: Secondary | ICD-10-CM | POA: Diagnosis not present

## 2015-09-07 DIAGNOSIS — M25511 Pain in right shoulder: Secondary | ICD-10-CM | POA: Diagnosis not present

## 2015-09-07 DIAGNOSIS — M25612 Stiffness of left shoulder, not elsewhere classified: Secondary | ICD-10-CM | POA: Diagnosis not present

## 2015-09-08 DIAGNOSIS — F4323 Adjustment disorder with mixed anxiety and depressed mood: Secondary | ICD-10-CM | POA: Diagnosis not present

## 2015-09-09 ENCOUNTER — Ambulatory Visit (INDEPENDENT_AMBULATORY_CARE_PROVIDER_SITE_OTHER): Payer: Medicare Other

## 2015-09-09 ENCOUNTER — Telehealth: Payer: Self-pay

## 2015-09-09 DIAGNOSIS — E291 Testicular hypofunction: Secondary | ICD-10-CM | POA: Diagnosis not present

## 2015-09-09 MED ORDER — TESTOSTERONE CYPIONATE 200 MG/ML IM SOLN
200.0000 mg | Freq: Once | INTRAMUSCULAR | Status: AC
  Administered 2015-09-09: 200 mg via INTRAMUSCULAR

## 2015-09-09 NOTE — Telephone Encounter (Signed)
He needs an appointment with me

## 2015-09-09 NOTE — Progress Notes (Signed)
Testosterone IM Injection  Due to Hypogonadism patient is present today for a Testosterone Injection.  Medication: Testosterone Cypionate Dose: 46mL Location: left upper outer buttocks Lot: [id number] Exp:08/2015  Patient tolerated well, no complications were noted  Preformed by: Vikki Ports watkins, LPN   Follow up: pt medication will expire this month. Therefore a refill request will be submitted to [facility].

## 2015-09-09 NOTE — Telephone Encounter (Signed)
Pt came in today for testosterone injection. Pt medication expires this month. Pt requested a refill. Please advise.

## 2015-09-11 DIAGNOSIS — M25512 Pain in left shoulder: Secondary | ICD-10-CM | POA: Diagnosis not present

## 2015-09-11 DIAGNOSIS — M25612 Stiffness of left shoulder, not elsewhere classified: Secondary | ICD-10-CM | POA: Diagnosis not present

## 2015-09-11 DIAGNOSIS — M25511 Pain in right shoulder: Secondary | ICD-10-CM | POA: Diagnosis not present

## 2015-09-11 DIAGNOSIS — M25611 Stiffness of right shoulder, not elsewhere classified: Secondary | ICD-10-CM | POA: Diagnosis not present

## 2015-09-11 NOTE — Telephone Encounter (Signed)
LMOM

## 2015-09-14 DIAGNOSIS — M25511 Pain in right shoulder: Secondary | ICD-10-CM | POA: Diagnosis not present

## 2015-09-14 DIAGNOSIS — M25612 Stiffness of left shoulder, not elsewhere classified: Secondary | ICD-10-CM | POA: Diagnosis not present

## 2015-09-14 DIAGNOSIS — M25611 Stiffness of right shoulder, not elsewhere classified: Secondary | ICD-10-CM | POA: Diagnosis not present

## 2015-09-14 DIAGNOSIS — M25512 Pain in left shoulder: Secondary | ICD-10-CM | POA: Diagnosis not present

## 2015-09-16 ENCOUNTER — Ambulatory Visit (INDEPENDENT_AMBULATORY_CARE_PROVIDER_SITE_OTHER): Payer: Medicare Other | Admitting: Family Medicine

## 2015-09-16 ENCOUNTER — Encounter: Payer: Self-pay | Admitting: Family Medicine

## 2015-09-16 VITALS — BP 124/60 | HR 57 | Temp 98.5°F | Resp 16 | Wt 163.0 lb

## 2015-09-16 DIAGNOSIS — R1310 Dysphagia, unspecified: Secondary | ICD-10-CM

## 2015-09-16 NOTE — Telephone Encounter (Signed)
Patient returned call and made an appointment to see shannon on 09/22/15 at 3:15pm.

## 2015-09-16 NOTE — Progress Notes (Signed)
       Patient: Ivan Salazar Male    DOB: 1931/09/11   82 y.o.   MRN: [id number] Visit Date: 09/16/2015  Today's Provider: Lelon Huh, MD   Chief Complaint  Patient presents with  . Dysphagia   Subjective:    HPI Dysphagia: Patient had ER visit in early April when steak became lodged in his esophagus, but spontaneously resolved. He had UGI remarkable only for Esophageal B ring. He states he now has persistent sensation of mucous getting stuck in throat and wants to see Dr. [name] to have it evaluated. He has been very cautious with diet and avoiding meat.     Allergies  Allergen Reactions  . Celebrex [Celecoxib]     Noxius taste in mouth   Previous Medications   ATORVASTATIN (LIPITOR) 80 MG TABLET    TAKE 1 TABLET DAILY   CLOPIDOGREL (PLAVIX) 75 MG TABLET    TAKE 1 TABLET DAILY   ENALAPRIL (VASOTEC) 5 MG TABLET    Take 5 mg by mouth daily.    FLUTICASONE (FLONASE) 50 MCG/ACT NASAL SPRAY    Place 2 sprays into both nostrils daily.   HYDROCODONE-ACETAMINOPHEN (NORCO) 7.5-325 MG TABLET    1 tablet every 4-6 hours as needed   LATANOPROST (XALATAN) 0.005 % OPHTHALMIC SOLUTION       METOPROLOL TARTRATE (LOPRESSOR) 25 MG TABLET    TAKE 1 TABLET (25 MG TOTAL) BY MOUTH 2 (TWO) TIMES DAILY.   NITROGLYCERIN (NITROSTAT) 0.4 MG SL TABLET    Place 1 tablet (0.4 mg total) under the tongue every 5 (five) minutes as needed for chest pain.   RANITIDINE (ZANTAC) 300 MG TABLET    TAKE 1 TABLET TWICE DAILY   TESTOSTERONE CYPIONATE (DEPOTESTOSTERONE CYPIONATE) 200 MG/ML INJECTION    Inject 1 mL (200 mg total) into the muscle every 28 (twenty-eight) days.   ZOLPIDEM (AMBIEN) 10 MG TABLET    TAKE ONE-HALF TO ONE TABLET BY MOUTH AT BEDTIME AS NEEDED    Review of Systems  Constitutional: Negative for fever, chills and appetite change.  HENT: Positive for trouble swallowing.   Respiratory: Negative for chest tightness, shortness of breath and wheezing.   Cardiovascular: Negative for chest pain and  palpitations.  Gastrointestinal: Negative for nausea, vomiting and abdominal pain.    Social History  Substance Use Topics  . Smoking status: Former Smoker -- 0.50 packs/day for 20 years    Quit date: 05/01/1977  . Smokeless tobacco: Never Used  . Alcohol Use: Yes     Comment: moderate; 2 drinks daily   Objective:   BP 124/60 mmHg  Pulse 57  Temp(Src) 98.5 F (36.9 C) (Oral)  Resp 16  Wt 163 lb (73.936 kg)  SpO2 97%  Physical Exam     Assessment & Plan:     1. Dysphagia  - Ambulatory referral to Gastroenterology  May benefit for mucinex.       Lelon Huh, MD  [facility]

## 2015-09-16 NOTE — Telephone Encounter (Signed)
LMOM

## 2015-09-18 DIAGNOSIS — M25512 Pain in left shoulder: Secondary | ICD-10-CM | POA: Diagnosis not present

## 2015-09-18 DIAGNOSIS — M25611 Stiffness of right shoulder, not elsewhere classified: Secondary | ICD-10-CM | POA: Diagnosis not present

## 2015-09-18 DIAGNOSIS — M25612 Stiffness of left shoulder, not elsewhere classified: Secondary | ICD-10-CM | POA: Diagnosis not present

## 2015-09-18 DIAGNOSIS — M25511 Pain in right shoulder: Secondary | ICD-10-CM | POA: Diagnosis not present

## 2015-09-21 DIAGNOSIS — M25611 Stiffness of right shoulder, not elsewhere classified: Secondary | ICD-10-CM | POA: Diagnosis not present

## 2015-09-21 DIAGNOSIS — M25612 Stiffness of left shoulder, not elsewhere classified: Secondary | ICD-10-CM | POA: Diagnosis not present

## 2015-09-21 DIAGNOSIS — M25511 Pain in right shoulder: Secondary | ICD-10-CM | POA: Diagnosis not present

## 2015-09-21 DIAGNOSIS — M25512 Pain in left shoulder: Secondary | ICD-10-CM | POA: Diagnosis not present

## 2015-09-22 ENCOUNTER — Ambulatory Visit: Payer: Medicare Other | Admitting: Urology

## 2015-09-22 DIAGNOSIS — R1319 Other dysphagia: Secondary | ICD-10-CM | POA: Diagnosis not present

## 2015-09-23 DIAGNOSIS — M25612 Stiffness of left shoulder, not elsewhere classified: Secondary | ICD-10-CM | POA: Diagnosis not present

## 2015-09-23 DIAGNOSIS — M25512 Pain in left shoulder: Secondary | ICD-10-CM | POA: Diagnosis not present

## 2015-09-23 DIAGNOSIS — M25511 Pain in right shoulder: Secondary | ICD-10-CM | POA: Diagnosis not present

## 2015-09-23 DIAGNOSIS — M25611 Stiffness of right shoulder, not elsewhere classified: Secondary | ICD-10-CM | POA: Diagnosis not present

## 2015-09-24 ENCOUNTER — Encounter: Payer: Self-pay | Admitting: Urology

## 2015-09-24 ENCOUNTER — Ambulatory Visit (INDEPENDENT_AMBULATORY_CARE_PROVIDER_SITE_OTHER): Payer: Medicare Other | Admitting: Urology

## 2015-09-24 VITALS — BP 134/59 | HR 52 | Ht 70.0 in | Wt 160.9 lb

## 2015-09-24 DIAGNOSIS — N401 Enlarged prostate with lower urinary tract symptoms: Secondary | ICD-10-CM | POA: Diagnosis not present

## 2015-09-24 DIAGNOSIS — N138 Other obstructive and reflux uropathy: Secondary | ICD-10-CM

## 2015-09-24 DIAGNOSIS — I209 Angina pectoris, unspecified: Secondary | ICD-10-CM

## 2015-09-24 DIAGNOSIS — N528 Other male erectile dysfunction: Secondary | ICD-10-CM

## 2015-09-24 DIAGNOSIS — E291 Testicular hypofunction: Secondary | ICD-10-CM

## 2015-09-24 DIAGNOSIS — N529 Male erectile dysfunction, unspecified: Secondary | ICD-10-CM

## 2015-09-24 NOTE — Progress Notes (Signed)
2:56 PM   Ivan Salazar 03/31/1932 [id number]  Referring provider: Birdie Sons, MD [address] [place], [place] [address]  Chief Complaint  Patient presents with  . Hypogonadism    talk about testosterone refill    HPI: Patient is an 82 year old Caucasian male with hypogonadism, erectile dysfunction and BPH with LUTS who presents today for 6 month follow-up.  Hypogonadism Patient is experiencing a decrease in libido, a decrease in strength, a decreased enjoyment in life and erections being less strong.  This is indicated by his responses to the ADAM questionnaire.  He is currently managing his hypogonadism with testosterone cypionate 1 mL every three weeks.  Patient's most recent testosterone was 232 ng/dL on 06/03/2015.      Androgen Deficiency in the Aging Male      09/24/15 1400       Androgen Deficiency in the Aging Male   Do you have a decrease in libido (sex drive) Yes     Do you have lack of energy No     Do you have a decrease in strength and/or endurance Yes     Have you lost height No     Have you noticed a decreased "enjoyment of life" Yes     Are you sad and/or grumpy No     Are your erections less strong Yes     Have you noticed a recent deterioration in your ability to play sports No     Are you falling asleep after dinner No     Has there been a recent deterioration in your work performance No        Erectile dysfunction His SHIM score is 13, which is mild to moderate erectile dysfunction.   He has been having difficulty with erections for several years.   His major complaint is he does not have a sexual partner.  His libido is diminished.   His risk factors for ED are age, LUTS, HLD, CAD and HTN.  He denies any painful erections or curvatures with his erections.   He has tried PDE5-inhibitors in the past.        SHIM      09/24/15 1433       SHIM: Over the last 6 months:   How do you rate your confidence that you could get and  keep an erection? Moderate     When you had erections with sexual stimulation, how often were your erections hard enough for penetration (entering your partner)? A Few Times (much less than half the time)     During sexual intercourse, how often were you able to maintain your erection after you had penetrated (entered) your partner? Difficult     During sexual intercourse, how difficult was it to maintain your erection to completion of intercourse? Difficult     When you attempted sexual intercourse, how often was it satisfactory for you? Very Difficult     SHIM Total Score   SHIM 13        Score: 1-7 Severe ED 8-11 Moderate ED 12-16 Mild-Moderate ED 17-21 Mild ED 22-25 No ED   BPH WITH LUTS His IPSS score today is 8, which is moderate lower urinary tract symptomatology. He is delighted with his quality life due to his urinary symptoms.  He denies any dysuria, hematuria or suprapubic pain.  He also denies any recent fevers, chills, nausea or vomiting.  He does not have a family history of PCa.  IPSS      09/24/15 1400       International Prostate Symptom Score   How often have you had the sensation of not emptying your bladder? Not at All     How often have you had to urinate less than every two hours? Less than 1 in 5 times     How often have you found you stopped and started again several times when you urinated? Less than half the time     How often have you found it difficult to postpone urination? Less than 1 in 5 times     How often have you had a weak urinary stream? Less than half the time     How often have you had to strain to start urination? Less than 1 in 5 times     How many times did you typically get up at night to urinate? 1 Time     Total IPSS Score 8     Quality of Life due to urinary symptoms   If you were to spend the rest of your life with your urinary condition just the way it is now how would you feel about that? Delighted        Score:  1-7  Mild 8-19 Moderate 20-35 Severe   PMH: Past Medical History  Diagnosis Date  . Hypogonadism in male   . Impotence   . Nocturia   . CAD (coronary artery disease)   . Carpal tunnel syndrome   . HLD (hyperlipidemia)   . BPH (benign prostatic hyperplasia)   . DDD (degenerative disc disease), cervical     Surgical History: Past Surgical History  Procedure Laterality Date  . Cardiac catheterization  2004  . Coronary stent placement  2005  . Bilateral carpal tunnel release      05/12/2011, 06/09/2011  . Upper gi endoscopy  08/02/2010    Dr. Tedra Coupe, gastritis. H Pylori negative  . Coronary artery bypass graft  2007    5; [facility]    Home Medications:    Medication List       This list is accurate as of: 09/24/15  2:56 PM.  Always use your most recent med list.               atorvastatin 80 MG tablet  Commonly known as:  LIPITOR  TAKE 1 TABLET DAILY     clopidogrel 75 MG tablet  Commonly known as:  PLAVIX  TAKE 1 TABLET DAILY     enalapril 5 MG tablet  Commonly known as:  VASOTEC  Take 5 mg by mouth daily.     fluticasone 50 MCG/ACT nasal spray  Commonly known as:  FLONASE  Place 2 sprays into both nostrils daily.     HYDROcodone-acetaminophen 7.5-325 MG tablet  Commonly known as:  NORCO  1 tablet every 4-6 hours as needed     latanoprost 0.005 % ophthalmic solution  Commonly known as:  XALATAN     metoprolol tartrate 25 MG tablet  Commonly known as:  LOPRESSOR  TAKE 1 TABLET (25 MG TOTAL) BY MOUTH 2 (TWO) TIMES DAILY.     nitroGLYCERIN 0.4 MG SL tablet  Commonly known as:  NITROSTAT  Place 1 tablet (0.4 mg total) under the tongue every 5 (five) minutes as needed for chest pain.     ranitidine 300 MG tablet  Commonly known as:  ZANTAC  TAKE 1 TABLET TWICE DAILY     testosterone cypionate 200 MG/ML injection  Commonly known as:  DEPOTESTOSTERONE CYPIONATE  Inject 1 mL (200 mg total) into the muscle every 28 (twenty-eight) days.     zolpidem 10 MG  tablet  Commonly known as:  AMBIEN  TAKE ONE-HALF TO ONE TABLET BY MOUTH AT BEDTIME AS NEEDED        Allergies:  Allergies  Allergen Reactions  . Celebrex [Celecoxib]     Noxius taste in mouth    Family History: Family History  Problem Relation Age of Onset  . Cirrhosis Father     of liver  . Kidney disease Neg Hx   . Prostate cancer Neg Hx     Social History:  reports that he quit smoking about 38 years ago. He has never used smokeless tobacco. He reports that he drinks alcohol. He reports that he does not use illicit drugs.  ROS: UROLOGY Frequent Urination?: No Hard to postpone urination?: No Burning/pain with urination?: No Get up at night to urinate?: Yes Leakage of urine?: No Urine stream starts and stops?: No Trouble starting stream?: No Do you have to strain to urinate?: No Blood in urine?: No Urinary tract infection?: No Sexually transmitted disease?: No Injury to kidneys or bladder?: No Painful intercourse?: No Weak stream?: No Erection problems?: No Penile pain?: No  Gastrointestinal Nausea?: No Vomiting?: No Indigestion/heartburn?: Yes Diarrhea?: No Constipation?: No  Constitutional Fever: No Night sweats?: No Weight loss?: No Fatigue?: No  Skin Skin rash/lesions?: No Itching?: No  Eyes Blurred vision?: No Double vision?: No  Ears/Nose/Throat Sore throat?: No Sinus problems?: Yes  Hematologic/Lymphatic Swollen glands?: No Easy bruising?: Yes  Cardiovascular Leg swelling?: No Chest pain?: No  Respiratory Cough?: No Shortness of breath?: No  Endocrine Excessive thirst?: No  Musculoskeletal Back pain?: No Joint pain?: Yes  Neurological Headaches?: No Dizziness?: No  Psychologic Depression?: No Anxiety?: No  Physical Exam: BP 134/59 mmHg  Pulse 52  Ht 5\' 10"  (1.778 m)  Wt 160 lb 14.4 oz (72.984 kg)  BMI 23.09 kg/m2  Constitutional: Well nourished. Alert and oriented, No acute distress. HEENT: [place] AT, moist  mucus membranes. Trachea midline, no masses. Cardiovascular: No clubbing, cyanosis, or edema. Respiratory: Normal respiratory effort, no increased work of breathing. GI: Abdomen is soft, non tender, non distended, no abdominal masses. Liver and spleen not palpable.  No hernias appreciated.  Stool sample for occult testing is not indicated.   GU: No CVA tenderness.  No bladder fullness or masses.  Patient with circumcised phallus.  Urethral meatus is patent.  No penile discharge. No penile lesions or rashes. Scrotum without lesions, cysts, rashes and/or edema.  Testicles are located scrotally bilaterally. No masses are appreciated in the testicles. Left and right epididymis are normal. Rectal: Patient with  normal sphincter tone. Anus and perineum without scarring or rashes. No rectal masses are appreciated. Prostate is approximately 50 grams, no nodules are appreciated. Seminal vesicles are normal. Skin: No rashes, bruises or suspicious lesions. Lymph: No cervical or inguinal adenopathy. Neurologic: Grossly intact, no focal deficits, moving all 4 extremities. Psychiatric: Normal mood and affect.   Laboratory Data:  Lab Results  Component Value Date   CREATININE 1.17 06/05/2015    PSA history  0.3 ng/mL on 11/29/2013  0.5 ng/mL on 03/09/2015  Lab Results  Component Value Date   TESTOSTERONE 232* 06/03/2015     Assessment & Plan:    1. Hypogonadism:   Patient will continue his testosterone cypionate 200 mg/mL, 1 cc every 3 weeks.  He will be due for  hematocrit and testosterone level in 3 months.  - testosterone - HCT  2. Erectile dysfunction:   SHIM score is 13.  Patient is not interested in pursuing treatment for erectile dysfunction at this time.  3.  BPH with LUTS:   IPSS score is 8/0.  We will continue to monitor.  He will have his IPSS score, exam and PSA in 6 months.  - PSA   Return in about 3 months (around 12/25/2015) for HCT and testosterone.  Zara Council,  [facility] [address] [place], [place] [address] [phone]

## 2015-09-25 LAB — TESTOSTERONE: TESTOSTERONE: 285 ng/dL — AB (ref 348–1197)

## 2015-09-25 LAB — PSA: Prostate Specific Ag, Serum: 0.8 ng/mL (ref 0.0–4.0)

## 2015-09-25 LAB — HEMATOCRIT: Hematocrit: 39 % (ref 37.5–51.0)

## 2015-09-25 MED ORDER — TESTOSTERONE CYPIONATE 200 MG/ML IM SOLN: 200.0000 mg | INTRAMUSCULAR | Status: DC

## 2015-09-30 ENCOUNTER — Ambulatory Visit (INDEPENDENT_AMBULATORY_CARE_PROVIDER_SITE_OTHER): Payer: Medicare Other

## 2015-09-30 ENCOUNTER — Telehealth: Payer: Self-pay

## 2015-09-30 DIAGNOSIS — E291 Testicular hypofunction: Secondary | ICD-10-CM

## 2015-09-30 DIAGNOSIS — M25511 Pain in right shoulder: Secondary | ICD-10-CM | POA: Diagnosis not present

## 2015-09-30 DIAGNOSIS — M25611 Stiffness of right shoulder, not elsewhere classified: Secondary | ICD-10-CM | POA: Diagnosis not present

## 2015-09-30 DIAGNOSIS — M25512 Pain in left shoulder: Secondary | ICD-10-CM | POA: Diagnosis not present

## 2015-09-30 DIAGNOSIS — M25612 Stiffness of left shoulder, not elsewhere classified: Secondary | ICD-10-CM | POA: Diagnosis not present

## 2015-09-30 MED ORDER — TESTOSTERONE CYPIONATE 200 MG/ML IM SOLN
200.0000 mg | Freq: Once | INTRAMUSCULAR | Status: AC
  Administered 2015-09-30: 200 mg via INTRAMUSCULAR

## 2015-09-30 NOTE — Telephone Encounter (Signed)
LMOM- Ivan Salazar stated it would ok to switch injections to q4weeks.

## 2015-09-30 NOTE — Telephone Encounter (Signed)
Pt came to get injection today. Pt requested to get injection q4weeks instead of q3weeks. Please advise.

## 2015-09-30 NOTE — Progress Notes (Signed)
Testosterone IM Injection  Due to Hypogonadism patient is present today for a Testosterone Injection.  Medication: Testosterone Cypionate Dose: 53mL Location: right upper outer buttocks Lot: [id number] Exp:10/2016  Patient tolerated well, no complications were noted  Preformed by: Toniann Fail, LPN

## 2015-09-30 NOTE — Telephone Encounter (Signed)
That would be fine 

## 2015-10-06 DIAGNOSIS — M25512 Pain in left shoulder: Secondary | ICD-10-CM | POA: Diagnosis not present

## 2015-10-06 DIAGNOSIS — M25611 Stiffness of right shoulder, not elsewhere classified: Secondary | ICD-10-CM | POA: Diagnosis not present

## 2015-10-06 DIAGNOSIS — M25511 Pain in right shoulder: Secondary | ICD-10-CM | POA: Diagnosis not present

## 2015-10-06 DIAGNOSIS — M25612 Stiffness of left shoulder, not elsewhere classified: Secondary | ICD-10-CM | POA: Diagnosis not present

## 2015-10-08 DIAGNOSIS — M25512 Pain in left shoulder: Secondary | ICD-10-CM | POA: Diagnosis not present

## 2015-10-08 DIAGNOSIS — M25612 Stiffness of left shoulder, not elsewhere classified: Secondary | ICD-10-CM | POA: Diagnosis not present

## 2015-10-08 DIAGNOSIS — M25511 Pain in right shoulder: Secondary | ICD-10-CM | POA: Diagnosis not present

## 2015-10-08 DIAGNOSIS — M25611 Stiffness of right shoulder, not elsewhere classified: Secondary | ICD-10-CM | POA: Diagnosis not present

## 2015-10-09 DIAGNOSIS — L739 Follicular disorder, unspecified: Secondary | ICD-10-CM | POA: Diagnosis not present

## 2015-10-09 DIAGNOSIS — L570 Actinic keratosis: Secondary | ICD-10-CM | POA: Diagnosis not present

## 2015-10-09 DIAGNOSIS — D485 Neoplasm of uncertain behavior of skin: Secondary | ICD-10-CM | POA: Diagnosis not present

## 2015-10-09 DIAGNOSIS — L080 Pyoderma: Secondary | ICD-10-CM | POA: Diagnosis not present

## 2015-10-09 DIAGNOSIS — L814 Other melanin hyperpigmentation: Secondary | ICD-10-CM | POA: Diagnosis not present

## 2015-10-09 DIAGNOSIS — L821 Other seborrheic keratosis: Secondary | ICD-10-CM | POA: Diagnosis not present

## 2015-10-09 DIAGNOSIS — D692 Other nonthrombocytopenic purpura: Secondary | ICD-10-CM | POA: Diagnosis not present

## 2015-10-09 DIAGNOSIS — Z85828 Personal history of other malignant neoplasm of skin: Secondary | ICD-10-CM | POA: Diagnosis not present

## 2015-10-09 DIAGNOSIS — L816 Other disorders of diminished melanin formation: Secondary | ICD-10-CM | POA: Diagnosis not present

## 2015-10-13 DIAGNOSIS — M25512 Pain in left shoulder: Secondary | ICD-10-CM | POA: Diagnosis not present

## 2015-10-13 DIAGNOSIS — M25611 Stiffness of right shoulder, not elsewhere classified: Secondary | ICD-10-CM | POA: Diagnosis not present

## 2015-10-13 DIAGNOSIS — M25511 Pain in right shoulder: Secondary | ICD-10-CM | POA: Diagnosis not present

## 2015-10-13 DIAGNOSIS — M25612 Stiffness of left shoulder, not elsewhere classified: Secondary | ICD-10-CM | POA: Diagnosis not present

## 2015-10-15 ENCOUNTER — Encounter: Payer: Self-pay | Admitting: Family Medicine

## 2015-10-15 ENCOUNTER — Ambulatory Visit (INDEPENDENT_AMBULATORY_CARE_PROVIDER_SITE_OTHER): Payer: Medicare Other | Admitting: Family Medicine

## 2015-10-15 VITALS — BP 110/60 | HR 54 | Temp 97.7°F | Resp 16 | Ht 70.0 in | Wt 160.0 lb

## 2015-10-15 DIAGNOSIS — M25611 Stiffness of right shoulder, not elsewhere classified: Secondary | ICD-10-CM | POA: Diagnosis not present

## 2015-10-15 DIAGNOSIS — I209 Angina pectoris, unspecified: Secondary | ICD-10-CM | POA: Diagnosis not present

## 2015-10-15 DIAGNOSIS — M25512 Pain in left shoulder: Secondary | ICD-10-CM | POA: Diagnosis not present

## 2015-10-15 DIAGNOSIS — M25612 Stiffness of left shoulder, not elsewhere classified: Secondary | ICD-10-CM | POA: Diagnosis not present

## 2015-10-15 DIAGNOSIS — M25511 Pain in right shoulder: Secondary | ICD-10-CM | POA: Diagnosis not present

## 2015-10-15 DIAGNOSIS — Z789 Other specified health status: Secondary | ICD-10-CM

## 2015-10-15 NOTE — Progress Notes (Signed)
       Patient: Ivan Salazar Male    DOB: 22-Sep-1931   82 y.o.   MRN: [id number] Visit Date: 10/15/2015  Today's Provider: Lelon Huh, MD   Chief Complaint  Patient presents with  . Suture / Staple Removal   Subjective:    HPI   Patient had spot or indentation on left side forehead removed by dermatologist in [place], 10/08/2015. Patient did not want to drive back to dermatologist's office to have stitch removed. Patient is still waiting for results from biopsy.      Allergies  Allergen Reactions  . Celebrex [Celecoxib]     Noxius taste in mouth   Current Meds  Medication Sig  . atorvastatin (LIPITOR) 80 MG tablet TAKE 1 TABLET DAILY  . clopidogrel (PLAVIX) 75 MG tablet TAKE 1 TABLET DAILY  . enalapril (VASOTEC) 5 MG tablet Take 5 mg by mouth daily.   . fluticasone (FLONASE) 50 MCG/ACT nasal spray Place 2 sprays into both nostrils daily.  Marland Kitchen HYDROcodone-acetaminophen (NORCO) 7.5-325 MG tablet 1 tablet every 4-6 hours as needed  . latanoprost (XALATAN) 0.005 % ophthalmic solution   . metoprolol tartrate (LOPRESSOR) 25 MG tablet TAKE 1 TABLET (25 MG TOTAL) BY MOUTH 2 (TWO) TIMES DAILY.  . nitroGLYCERIN (NITROSTAT) 0.4 MG SL tablet Place 1 tablet (0.4 mg total) under the tongue every 5 (five) minutes as needed for chest pain.  . ranitidine (ZANTAC) 300 MG tablet TAKE 1 TABLET TWICE DAILY  . testosterone cypionate (DEPOTESTOSTERONE CYPIONATE) 200 MG/ML injection Inject 1 mL (200 mg total) into the muscle every 28 (twenty-eight) days.  Marland Kitchen zolpidem (AMBIEN) 10 MG tablet TAKE ONE-HALF TO ONE TABLET BY MOUTH AT BEDTIME AS NEEDED    Review of Systems  Constitutional: Negative for fever, chills and appetite change.  Respiratory: Negative for chest tightness, shortness of breath and wheezing.   Cardiovascular: Negative for chest pain and palpitations.  Gastrointestinal: Negative for nausea, vomiting and abdominal pain.    Social History  Substance Use Topics  . Smoking  status: Former Smoker -- 0.50 packs/day for 20 years    Quit date: 05/01/1977  . Smokeless tobacco: Never Used  . Alcohol Use: Yes     Comment: moderate; 2 drinks daily   Objective:   BP 110/60 mmHg  Pulse 54  Temp(Src) 97.7 F (36.5 C) (Oral)  Resp 16  Ht 5\' 10"  (1.778 m)  Wt 160 lb (72.576 kg)  BMI 22.96 kg/m2  SpO2 97%  Physical Exam  Skin: Single suture left side of forehead, well healed incision with no drainage and no erythema.     Assessment & Plan:     1. Presence of surgical incision Suture removed with no complications. Well tolerated. No specific wound care need as incision has completely healed.        Lelon Huh, MD  [facility]

## 2015-10-19 DIAGNOSIS — M25512 Pain in left shoulder: Secondary | ICD-10-CM | POA: Diagnosis not present

## 2015-10-19 DIAGNOSIS — M25611 Stiffness of right shoulder, not elsewhere classified: Secondary | ICD-10-CM | POA: Diagnosis not present

## 2015-10-19 DIAGNOSIS — M25612 Stiffness of left shoulder, not elsewhere classified: Secondary | ICD-10-CM | POA: Diagnosis not present

## 2015-10-19 DIAGNOSIS — M25511 Pain in right shoulder: Secondary | ICD-10-CM | POA: Diagnosis not present

## 2015-10-20 DIAGNOSIS — F4323 Adjustment disorder with mixed anxiety and depressed mood: Secondary | ICD-10-CM | POA: Diagnosis not present

## 2015-10-21 DIAGNOSIS — M25512 Pain in left shoulder: Secondary | ICD-10-CM | POA: Diagnosis not present

## 2015-10-21 DIAGNOSIS — M25611 Stiffness of right shoulder, not elsewhere classified: Secondary | ICD-10-CM | POA: Diagnosis not present

## 2015-10-21 DIAGNOSIS — M25612 Stiffness of left shoulder, not elsewhere classified: Secondary | ICD-10-CM | POA: Diagnosis not present

## 2015-10-21 DIAGNOSIS — M25511 Pain in right shoulder: Secondary | ICD-10-CM | POA: Diagnosis not present

## 2015-10-26 ENCOUNTER — Ambulatory Visit (INDEPENDENT_AMBULATORY_CARE_PROVIDER_SITE_OTHER): Payer: Medicare Other

## 2015-10-26 ENCOUNTER — Telehealth: Payer: Self-pay | Admitting: Family Medicine

## 2015-10-26 DIAGNOSIS — E291 Testicular hypofunction: Secondary | ICD-10-CM

## 2015-10-26 DIAGNOSIS — M5137 Other intervertebral disc degeneration, lumbosacral region: Secondary | ICD-10-CM

## 2015-10-26 MED ORDER — HYDROCODONE-ACETAMINOPHEN 7.5-325 MG PO TABS: ORAL_TABLET | ORAL | Status: DC

## 2015-10-26 MED ORDER — TESTOSTERONE CYPIONATE 200 MG/ML IM SOLN
200.0000 mg | Freq: Once | INTRAMUSCULAR | Status: AC
  Administered 2015-10-26: 200 mg via INTRAMUSCULAR

## 2015-10-26 NOTE — Telephone Encounter (Signed)
This is a Dr. Caryn Section patient.  Patient would like three prescriptions for HYDROcodone-acetaminophen (NORCO) 7.5-325 MG tablet. Starting on 11/03/2015.  I explained to the patient that Dr. Caryn Section is out of the office and since you are not his primary provider that I did not think you would be able to write three for him.  He asked me to ask you since he used to be your patient.

## 2015-10-26 NOTE — Telephone Encounter (Signed)
Will refill for one month and then call Dr. Caryn Section later for rest of refills. Thanks.

## 2015-10-26 NOTE — Progress Notes (Signed)
Testosterone IM Injection  Due to Hypogonadism patient is present today for a Testosterone Injection.  Medication: Testosterone Cypionate Dose: 36mL Location: left upper outer buttocks Lot: [id number] Exp:10/2016  Patient tolerated well, no complications were noted  Preformed by: Toniann Fail, LPN   Follow up: 4 weeks

## 2015-10-27 ENCOUNTER — Encounter: Payer: Self-pay | Admitting: *Deleted

## 2015-10-28 ENCOUNTER — Encounter
Admission: RE | Disposition: A | Discharge status: Home / Self Care | Payer: Self-pay | Source: Ambulatory Visit | Attending: Unknown Physician Specialty

## 2015-10-28 ENCOUNTER — Encounter: Payer: Self-pay | Admitting: *Deleted

## 2015-10-28 ENCOUNTER — Ambulatory Visit
Admission: RE | Admit: 2015-10-28 | Discharge: 2015-10-28 | Disposition: A | Discharge status: Home / Self Care | Payer: Medicare Other | Source: Ambulatory Visit | Attending: Unknown Physician Specialty | Admitting: Unknown Physician Specialty

## 2015-10-28 ENCOUNTER — Ambulatory Visit: Payer: Medicare Other | Admitting: Anesthesiology

## 2015-10-28 DIAGNOSIS — K2970 Gastritis, unspecified, without bleeding: Secondary | ICD-10-CM | POA: Diagnosis not present

## 2015-10-28 DIAGNOSIS — R1310 Dysphagia, unspecified: Secondary | ICD-10-CM | POA: Insufficient documentation

## 2015-10-28 DIAGNOSIS — Z951 Presence of aortocoronary bypass graft: Secondary | ICD-10-CM | POA: Diagnosis not present

## 2015-10-28 DIAGNOSIS — I2510 Atherosclerotic heart disease of native coronary artery without angina pectoris: Secondary | ICD-10-CM | POA: Diagnosis not present

## 2015-10-28 DIAGNOSIS — K2950 Unspecified chronic gastritis without bleeding: Secondary | ICD-10-CM | POA: Diagnosis not present

## 2015-10-28 DIAGNOSIS — Z87891 Personal history of nicotine dependence: Secondary | ICD-10-CM | POA: Insufficient documentation

## 2015-10-28 DIAGNOSIS — K319 Disease of stomach and duodenum, unspecified: Secondary | ICD-10-CM | POA: Insufficient documentation

## 2015-10-28 DIAGNOSIS — K2960 Other gastritis without bleeding: Secondary | ICD-10-CM | POA: Diagnosis not present

## 2015-10-28 DIAGNOSIS — Z79899 Other long term (current) drug therapy: Secondary | ICD-10-CM | POA: Diagnosis not present

## 2015-10-28 DIAGNOSIS — Z7951 Long term (current) use of inhaled steroids: Secondary | ICD-10-CM | POA: Insufficient documentation

## 2015-10-28 DIAGNOSIS — Z7902 Long term (current) use of antithrombotics/antiplatelets: Secondary | ICD-10-CM | POA: Diagnosis not present

## 2015-10-28 DIAGNOSIS — I10 Essential (primary) hypertension: Secondary | ICD-10-CM | POA: Diagnosis not present

## 2015-10-28 DIAGNOSIS — K222 Esophageal obstruction: Secondary | ICD-10-CM | POA: Insufficient documentation

## 2015-10-28 DIAGNOSIS — E785 Hyperlipidemia, unspecified: Secondary | ICD-10-CM | POA: Diagnosis not present

## 2015-10-28 DIAGNOSIS — I25810 Atherosclerosis of coronary artery bypass graft(s) without angina pectoris: Secondary | ICD-10-CM | POA: Diagnosis not present

## 2015-10-28 HISTORY — PX: ESOPHAGOGASTRODUODENOSCOPY (EGD) WITH PROPOFOL: SHX5813

## 2015-10-28 HISTORY — DX: Essential (primary) hypertension: I10

## 2015-10-28 HISTORY — DX: Abnormal weight loss: R63.4

## 2015-10-28 SURGERY — ESOPHAGOGASTRODUODENOSCOPY (EGD) WITH PROPOFOL
Anesthesia: General

## 2015-10-28 MED ORDER — FENTANYL CITRATE (PF) 100 MCG/2ML IJ SOLN
INTRAMUSCULAR | Status: DC | PRN
  Administered 2015-10-28: 50 ug via INTRAVENOUS

## 2015-10-28 MED ORDER — SODIUM CHLORIDE 0.9 % IV SOLN
INTRAVENOUS | Status: DC
  Administered 2015-10-28: 1000 mL via INTRAVENOUS
  Administered 2015-10-28: 10:00:00 via INTRAVENOUS

## 2015-10-28 MED ORDER — SODIUM CHLORIDE 0.9 % IV SOLN: INTRAVENOUS | Status: DC

## 2015-10-28 MED ORDER — PROPOFOL 10 MG/ML IV BOLUS
INTRAVENOUS | Status: DC | PRN
  Administered 2015-10-28: 100 mg via INTRAVENOUS

## 2015-10-28 MED ORDER — LIDOCAINE 2% (20 MG/ML) 5 ML SYRINGE
INTRAMUSCULAR | Status: DC | PRN
  Administered 2015-10-28: 40 mg via INTRAVENOUS

## 2015-10-28 MED ORDER — PROPOFOL 500 MG/50ML IV EMUL
INTRAVENOUS | Status: DC | PRN
  Administered 2015-10-28: 140 ug/kg/min via INTRAVENOUS

## 2015-10-28 MED ORDER — MIDAZOLAM HCL 5 MG/5ML IJ SOLN
INTRAMUSCULAR | Status: DC | PRN
  Administered 2015-10-28: 1 mg via INTRAVENOUS

## 2015-10-28 NOTE — Op Note (Signed)
[facility] Patient Name: Ivan Salazar Procedure Date: 10/28/2015 10:12 AM MRN: [id number] Account #: [id number] Date of Birth: 1932/04/27 Admit Type: Outpatient Age: 82 Room: [facility] ENDO ROOM 3 Gender: Male Note Status: Finalized Procedure:            Upper GI endoscopy Indications:          Dysphagia Providers:            Manya Silvas, MD Referring MD:         Kirstie Peri. Caryn Section, MD (Referring MD) Medicines:            Propofol per Anesthesia Complications:        No immediate complications. Procedure:            Pre-Anesthesia Assessment:                       - After reviewing the risks and benefits, the patient                        was deemed in satisfactory condition to undergo the                        procedure.                       After obtaining informed consent, the endoscope was                        passed under direct vision. Throughout the procedure,                        the patient's blood pressure, pulse, and oxygen                        saturations were monitored continuously. The Endoscope                        was introduced through the mouth, and advanced to the                        second part of duodenum. The upper GI endoscopy was                        accomplished without difficulty. The patient tolerated                        the procedure well. Findings:      A mild Schatzki ring (acquired) was found at the gastroesophageal       junction. At the end of the procedure A guidewire was placed and the       scope was withdrawn. Dilation was performed with a Savary dilator with       mild resistance at 16 mm and 17 mm.      Diffuse and patchy mild inflammation characterized by erythema and       granularity was found in the gastric body and in the gastric antrum.       Biopsies were taken with a cold forceps for histology. Biopsies were       taken with a cold forceps for Helicobacter pylori testing.      The  duodenal bulb and second  portion of the duodenum were normal. Impression:           - Mild Schatzki ring. Dilated.                       - Gastritis. Biopsied.                       - Normal duodenal bulb and second portion of the                        duodenum. Recommendation:       - Await pathology results. Manya Silvas, MD 10/28/2015 10:26:16 AM This report has been signed electronically. Number of Addenda: 0 Note Initiated On: 10/28/2015 10:12 AM      [facility]

## 2015-10-28 NOTE — H&P (Signed)
Primary Care Physician:  Lelon Huh, MD Primary Gastroenterologist:  Dr. Vira Agar  Pre-Procedure History & Physical: HPI:  Ivan Salazar is a 82 y.o. male is here for an endoscopy.   Past Medical History  Diagnosis Date  . Hypogonadism in male   . Impotence   . Nocturia   . CAD (coronary artery disease)   . Carpal tunnel syndrome   . HLD (hyperlipidemia)   . BPH (benign prostatic hyperplasia)   . DDD (degenerative disc disease), cervical   . Weight loss   . Hypertension   . CAD (coronary artery disease)     Past Surgical History  Procedure Laterality Date  . Cardiac catheterization  2004  . Coronary stent placement  2005  . Bilateral carpal tunnel release      05/12/2011, 06/09/2011  . Upper gi endoscopy  08/02/2010    Dr. Tedra Coupe, gastritis. H Pylori negative  . Coronary artery bypass graft  2007    5; [facility]  . Coronary angioplasty    . Tonsillectomy    . Appendectomy      Prior to Admission medications   Medication Sig Start Date End Date Taking? Authorizing Provider  HYDROcodone-acetaminophen (NORCO) 7.5-325 MG tablet 1 tablet every 4-6 hours as needed 10/26/15  Yes Margarita Rana, MD  metoprolol tartrate (LOPRESSOR) 25 MG tablet TAKE 1 TABLET (25 MG TOTAL) BY MOUTH 2 (TWO) TIMES DAILY. 08/25/15  Yes Birdie Sons, MD  ranitidine (ZANTAC) 300 MG tablet TAKE 1 TABLET TWICE DAILY 08/04/15  Yes Birdie Sons, MD  atorvastatin (LIPITOR) 80 MG tablet TAKE 1 TABLET DAILY 04/30/15   Birdie Sons, MD  clopidogrel (PLAVIX) 75 MG tablet TAKE 1 TABLET DAILY 12/24/14   Birdie Sons, MD  enalapril (VASOTEC) 5 MG tablet Take 5 mg by mouth daily.  09/26/14   Historical Provider, MD  fluticasone (FLONASE) 50 MCG/ACT nasal spray Place 2 sprays into both nostrils daily. 09/03/15   Birdie Sons, MD  latanoprost (XALATAN) 0.005 % ophthalmic solution  10/10/12   Historical Provider, MD  nitroGLYCERIN (NITROSTAT) 0.4 MG SL tablet Place 1 tablet (0.4 mg total) under the tongue  every 5 (five) minutes as needed for chest pain. 06/05/15   Birdie Sons, MD  testosterone cypionate (DEPOTESTOSTERONE CYPIONATE) 200 MG/ML injection Inject 1 mL (200 mg total) into the muscle every 28 (twenty-eight) days. 09/25/15   Nori Riis, PA-C  zolpidem (AMBIEN) 10 MG tablet TAKE ONE-HALF TO ONE TABLET BY MOUTH AT BEDTIME AS NEEDED 07/01/15   Birdie Sons, MD    Allergies as of 10/08/2015 - Review Complete 09/24/2015  Allergen Reaction Noted  . Celebrex [celecoxib]  10/22/2014    Family History  Problem Relation Age of Onset  . Cirrhosis Father     of liver  . Kidney disease Neg Hx   . Prostate cancer Neg Hx     Social History   Social History  . Marital Status: Married    Spouse Name: N/A  . Number of Children: 5  . Years of Education: N/A   Occupational History  . Retired    Social History Main Topics  . Smoking status: Former Smoker -- 0.50 packs/day for 20 years    Quit date: 05/01/1977  . Smokeless tobacco: Never Used  . Alcohol Use: Yes     Comment: moderate; 2 drinks daily  . Drug Use: No  . Sexual Activity: Not on file   Other Topics Concern  . Not  on file   Social History Narrative    Review of Systems: See HPI, otherwise negative ROS  Physical Exam: BP 148/59 mmHg  Pulse 50  Temp(Src) 98.2 F (36.8 C) (Tympanic)  Resp 16  Ht 5\' 10"  (1.778 m)  Wt 72.576 kg (160 lb)  BMI 22.96 kg/m2  SpO2 100% General:   Alert,  pleasant and cooperative in NAD Head:  Normocephalic and atraumatic. Neck:  Supple; no masses or thyromegaly. Lungs:  Clear throughout to auscultation.    Heart:  Regular rate and rhythm. Abdomen:  Soft, nontender and nondistended. Normal bowel sounds, without guarding, and without rebound.   Neurologic:  Alert and  oriented x4;  grossly normal neurologically.  Impression/Plan: Ivan Salazar is here for an endoscopy to be performed for dysphagia  Risks, benefits, limitations, and alternatives regarding  endoscopy have  been reviewed with the patient.  Questions have been answered.  All parties agreeable.   Gaylyn Cheers, MD  10/28/2015, 10:11 AM

## 2015-10-28 NOTE — Transfer of Care (Signed)
Immediate Anesthesia Transfer of Care Note  Patient: Ivan Salazar  Procedure(s) Performed: Procedure(s): ESOPHAGOGASTRODUODENOSCOPY (EGD) WITH PROPOFOL (N/A)  Patient Location: PACU and Endoscopy Unit    Anesthesia Type:General  Level of Consciousness: sedated  Airway & Oxygen Therapy: Patient Spontanous Breathing and Patient connected to nasal cannula oxygen  Post-op Assessment: Report given to RN and Post -op Vital signs reviewed and stable  Post vital signs: Reviewed and stable  Last Vitals:  Filed Vitals:   10/28/15 0906  BP: 148/59  Pulse: 50  Temp: 36.8 C  Resp: 16    Last Pain: There were no vitals filed for this visit.       Complications: No apparent anesthesia complications

## 2015-10-28 NOTE — Anesthesia Preprocedure Evaluation (Signed)
Anesthesia Evaluation  Patient identified by MRN, date of birth, ID band Patient awake    Reviewed: Allergy & Precautions, H&P , NPO status , Patient's Chart, lab work & pertinent test results, reviewed documented beta blocker date and time   Airway Mallampati: III   Neck ROM: full    Dental  (+) Poor Dentition, Teeth Intact   Pulmonary neg pulmonary ROS, former smoker,    Pulmonary exam normal        Cardiovascular hypertension, + CAD and + CABG  negative cardio ROS Normal cardiovascular exam Rhythm:regular Rate:Normal     Neuro/Psych  Neuromuscular disease negative neurological ROS  negative psych ROS   GI/Hepatic negative GI ROS, Neg liver ROS, GERD  Medicated,  Endo/Other  negative endocrine ROS  Renal/GU negative Renal ROS  negative genitourinary   Musculoskeletal   Abdominal   Peds  Hematology negative hematology ROS (+) anemia ,   Anesthesia Other Findings Past Medical History:   Hypogonadism in male                                         Impotence                                                    Nocturia                                                     CAD (coronary artery disease)                                Carpal tunnel syndrome                                       HLD (hyperlipidemia)                                         BPH (benign prostatic hyperplasia)                           DDD (degenerative disc disease), cervical                    Weight loss                                                  Hypertension                                                 CAD (coronary artery disease)  Past Surgical History:   CARDIAC CATHETERIZATION                          2004         CORONARY STENT PLACEMENT                         2005         BILATERAL CARPAL TUNNEL RELEASE                                 Comment:05/12/2011, 06/09/2011   UPPER GI ENDOSCOPY                                08/02/2010     Comment:Dr. Tedra Coupe, gastritis. H Pylori negative   CORONARY ARTERY BYPASS GRAFT                     2007           Comment:5; [facility]   CORONARY ANGIOPLASTY                                          TONSILLECTOMY                                                 APPENDECTOMY                                                BMI    Body Mass Index   22.95 kg/m 2     Reproductive/Obstetrics                             Anesthesia Physical Anesthesia Plan  ASA: III  Anesthesia Plan: General   Post-op Pain Management:    Induction:   Airway Management Planned:   Additional Equipment:   Intra-op Plan:   Post-operative Plan:   Informed Consent: I have reviewed the patients History and Physical, chart, labs and discussed the procedure including the risks, benefits and alternatives for the proposed anesthesia with the patient or authorized representative who has indicated his/her understanding and acceptance.   Dental Advisory Given  Plan Discussed with: CRNA  Anesthesia Plan Comments:         Anesthesia Quick Evaluation

## 2015-10-29 LAB — SURGICAL PATHOLOGY

## 2015-10-29 NOTE — Anesthesia Postprocedure Evaluation (Signed)
Anesthesia Post Note  Patient: Ivan Salazar  Procedure(s) Performed: Procedure(s) (LRB): ESOPHAGOGASTRODUODENOSCOPY (EGD) WITH PROPOFOL (N/A)  Patient location during evaluation: PACU Anesthesia Type: General Level of consciousness: awake and alert Pain management: pain level controlled Vital Signs Assessment: post-procedure vital signs reviewed and stable Respiratory status: spontaneous breathing, nonlabored ventilation, respiratory function stable and patient connected to nasal cannula oxygen Cardiovascular status: blood pressure returned to baseline and stable Postop Assessment: no signs of nausea or vomiting Anesthetic complications: no    Last Vitals:  Filed Vitals:   10/28/15 1050 10/28/15 1100  BP: 145/66 93/55  Pulse: 52 49  Temp:    Resp: 18 15    Last Pain:  Filed Vitals:   10/29/15 0749  PainSc: 0-No pain                 Molli Barrows

## 2015-10-30 ENCOUNTER — Encounter: Payer: Self-pay | Admitting: Unknown Physician Specialty

## 2015-11-04 ENCOUNTER — Encounter: Payer: Self-pay | Admitting: Family Medicine

## 2015-11-04 ENCOUNTER — Ambulatory Visit (INDEPENDENT_AMBULATORY_CARE_PROVIDER_SITE_OTHER): Payer: Medicare Other | Admitting: Family Medicine

## 2015-11-04 DIAGNOSIS — M5137 Other intervertebral disc degeneration, lumbosacral region: Secondary | ICD-10-CM | POA: Diagnosis not present

## 2015-11-04 DIAGNOSIS — I209 Angina pectoris, unspecified: Secondary | ICD-10-CM | POA: Diagnosis not present

## 2015-11-04 DIAGNOSIS — J3489 Other specified disorders of nose and nasal sinuses: Secondary | ICD-10-CM | POA: Diagnosis not present

## 2015-11-04 DIAGNOSIS — Q394 Esophageal web: Secondary | ICD-10-CM | POA: Diagnosis not present

## 2015-11-04 DIAGNOSIS — K222 Esophageal obstruction: Secondary | ICD-10-CM

## 2015-11-04 MED ORDER — HYDROCODONE-ACETAMINOPHEN 7.5-325 MG PO TABS: ORAL_TABLET | ORAL | Status: DC

## 2015-11-04 MED ORDER — FLUTICASONE PROPIONATE 50 MCG/ACT NA SUSP
2.0000 | Freq: Every day | NASAL | Status: DC
Start: 2015-11-04 — End: 2016-11-01

## 2015-11-04 MED ORDER — HYDROCODONE-ACETAMINOPHEN 7.5-325 MG PO TABS
ORAL_TABLET | ORAL | Status: DC
Start: 2015-11-04 — End: 2016-02-19

## 2015-11-04 NOTE — Progress Notes (Signed)
Patient: Ivan Salazar Male    DOB: 12/12/1931   82 y.o.   MRN: [id number] Visit Date: 11/04/2015  Today's Provider: Lelon Huh, MD   Chief Complaint  Patient presents with  . Dysphagia   Subjective:    HPI Dysphagia:  Patient recently underwent an Endoscopy procedure on 10/28/2015 which was done by Dr. [name]. Results showed Mild Schatzki ring, Dilated, Mild Gastritis biopsied; Normal Duodenal bulb and second portion of the duodenum. Patient comes in stating since having the Endoscopy, his dysphagia has slightly improved.   Runny nose:  Patient is here today to follow up on chronic runny nose. Patient states this has been occuring constantly for the past 2 months. Patient has tried taking Loratadine which has helped some. Was prescribed fluticasone nasal spray in May which he states worked well and he would like refilled.     Allergies  Allergen Reactions  . Celebrex [Celecoxib]     Noxius taste in mouth   Current Meds  Medication Sig  . atorvastatin (LIPITOR) 80 MG tablet TAKE 1 TABLET DAILY  . clopidogrel (PLAVIX) 75 MG tablet TAKE 1 TABLET DAILY  . enalapril (VASOTEC) 5 MG tablet Take 5 mg by mouth daily.   . fluticasone (FLONASE) 50 MCG/ACT nasal spray Place 2 sprays into both nostrils daily.  Marland Kitchen HYDROcodone-acetaminophen (NORCO) 7.5-325 MG tablet 1 tablet every 4-6 hours as needed  . latanoprost (XALATAN) 0.005 % ophthalmic solution   . loratadine (CLARITIN) 10 MG tablet Take 10 mg by mouth daily.  . metoprolol tartrate (LOPRESSOR) 25 MG tablet TAKE 1 TABLET (25 MG TOTAL) BY MOUTH 2 (TWO) TIMES DAILY.  . nitroGLYCERIN (NITROSTAT) 0.4 MG SL tablet Place 1 tablet (0.4 mg total) under the tongue every 5 (five) minutes as needed for chest pain.  . ranitidine (ZANTAC) 300 MG tablet TAKE 1 TABLET TWICE DAILY  . testosterone cypionate (DEPOTESTOSTERONE CYPIONATE) 200 MG/ML injection Inject 1 mL (200 mg total) into the muscle every 28 (twenty-eight) days.  Marland Kitchen zolpidem  (AMBIEN) 10 MG tablet TAKE ONE-HALF TO ONE TABLET BY MOUTH AT BEDTIME AS NEEDED    Review of Systems  Constitutional: Negative for fever, chills and appetite change.  HENT: Positive for rhinorrhea and trouble swallowing (improved).   Respiratory: Negative for chest tightness, shortness of breath and wheezing.   Cardiovascular: Negative for chest pain and palpitations.  Gastrointestinal: Negative for nausea, vomiting and abdominal pain.    Social History  Substance Use Topics  . Smoking status: Former Smoker -- 0.50 packs/day for 20 years    Quit date: 05/01/1977  . Smokeless tobacco: Never Used  . Alcohol Use: Yes     Comment: moderate; 2 drinks daily   Objective:   BP 104/54 mmHg  Pulse 56  Temp(Src) 97.9 F (36.6 C) (Oral)  Resp 16  Wt 163 lb (73.936 kg)  SpO2 97%  Physical Exam  General Appearance:    Alert, cooperative, no distress  HENT:   bilateral TM normal without fluid or infection, neck without nodes, sinuses nontender, post nasal drip noted and nasal mucosa pale and congested  Eyes:    PERRL, conjunctiva/corneas clear, EOM's intact       Lungs:     Clear to auscultation bilaterally, respirations unlabored  Heart:    Regular rate and rhythm  Neurologic:   Awake, alert, oriented x 3. No apparent focal neurological           defect.  Assessment & Plan:     1. DDD (degenerative disc disease), lumbosacral Needs refill hydrocodone/apap - HYDROcodone-acetaminophen (NORCO) 7.5-325 MG tablet; 1 tablet every 4-6 hours as needed  Dispense: 180 tablet; Refill: 0  2. Schatzki's ring IMproved after recent dilatation  3. Nasal drainage Did well in past on nasal steroid.  - fluticasone (FLONASE) 50 MCG/ACT nasal spray; Place 2 sprays into both nostrils daily.  Dispense: 16 g; Refill: 6     The entirety of the information documented in the History of Present Illness, Review of Systems and Physical Exam were personally obtained by me. Portions of this  information were initially documented by Meyer Cory, CMA and reviewed by me for thoroughness and accuracy.    Lelon Huh, MD  [facility]

## 2015-11-10 DIAGNOSIS — M25611 Stiffness of right shoulder, not elsewhere classified: Secondary | ICD-10-CM | POA: Diagnosis not present

## 2015-11-10 DIAGNOSIS — M25511 Pain in right shoulder: Secondary | ICD-10-CM | POA: Diagnosis not present

## 2015-11-10 DIAGNOSIS — M25512 Pain in left shoulder: Secondary | ICD-10-CM | POA: Diagnosis not present

## 2015-11-10 DIAGNOSIS — M25612 Stiffness of left shoulder, not elsewhere classified: Secondary | ICD-10-CM | POA: Diagnosis not present

## 2015-11-12 DIAGNOSIS — M25512 Pain in left shoulder: Secondary | ICD-10-CM | POA: Diagnosis not present

## 2015-11-12 DIAGNOSIS — M25611 Stiffness of right shoulder, not elsewhere classified: Secondary | ICD-10-CM | POA: Diagnosis not present

## 2015-11-12 DIAGNOSIS — M25511 Pain in right shoulder: Secondary | ICD-10-CM | POA: Diagnosis not present

## 2015-11-12 DIAGNOSIS — M25612 Stiffness of left shoulder, not elsewhere classified: Secondary | ICD-10-CM | POA: Diagnosis not present

## 2015-11-18 DIAGNOSIS — M25612 Stiffness of left shoulder, not elsewhere classified: Secondary | ICD-10-CM | POA: Diagnosis not present

## 2015-11-18 DIAGNOSIS — M25512 Pain in left shoulder: Secondary | ICD-10-CM | POA: Diagnosis not present

## 2015-11-18 DIAGNOSIS — M25611 Stiffness of right shoulder, not elsewhere classified: Secondary | ICD-10-CM | POA: Diagnosis not present

## 2015-11-18 DIAGNOSIS — M25511 Pain in right shoulder: Secondary | ICD-10-CM | POA: Diagnosis not present

## 2015-11-23 ENCOUNTER — Other Ambulatory Visit: Payer: Self-pay

## 2015-11-23 ENCOUNTER — Encounter: Payer: Self-pay | Admitting: Urology

## 2015-11-23 ENCOUNTER — Ambulatory Visit: Payer: Medicare Other

## 2015-11-23 MED ORDER — METOPROLOL TARTRATE 25 MG PO TABS: 25.0000 mg | ORAL_TABLET | Freq: Two times a day (BID) | ORAL | 4 refills | Status: DC

## 2015-11-23 NOTE — Telephone Encounter (Signed)
Patient called and states that he needs Metoprolol is due for refill, he is out today and thought [name] sent [place] notification about it. Please let patient [phone]

## 2015-11-25 DIAGNOSIS — M25611 Stiffness of right shoulder, not elsewhere classified: Secondary | ICD-10-CM | POA: Diagnosis not present

## 2015-11-25 DIAGNOSIS — M25511 Pain in right shoulder: Secondary | ICD-10-CM | POA: Diagnosis not present

## 2015-11-25 DIAGNOSIS — M25612 Stiffness of left shoulder, not elsewhere classified: Secondary | ICD-10-CM | POA: Diagnosis not present

## 2015-11-25 DIAGNOSIS — M25512 Pain in left shoulder: Secondary | ICD-10-CM | POA: Diagnosis not present

## 2015-11-30 ENCOUNTER — Ambulatory Visit (INDEPENDENT_AMBULATORY_CARE_PROVIDER_SITE_OTHER): Payer: Medicare Other | Admitting: Family Medicine

## 2015-11-30 DIAGNOSIS — E291 Testicular hypofunction: Secondary | ICD-10-CM

## 2015-11-30 MED ORDER — TESTOSTERONE CYPIONATE 200 MG/ML IM SOLN
200.0000 mg | Freq: Once | INTRAMUSCULAR | Status: AC
  Administered 2015-11-30: 200 mg via INTRAMUSCULAR

## 2015-11-30 NOTE — Progress Notes (Signed)
Testosterone IM Injection  Due to Hypogonadism patient is present today for a Testosterone Injection.  Medication: Testosterone Cypionate Dose: 61ml Location: right upper outer buttocks Lot: [id number] Exp:10/2016  Patient tolerated well, no complications were noted  Preformed by: Elberta Leatherwood, CMA  Follow up: 1 month

## 2015-12-02 DIAGNOSIS — M25612 Stiffness of left shoulder, not elsewhere classified: Secondary | ICD-10-CM | POA: Diagnosis not present

## 2015-12-02 DIAGNOSIS — M25512 Pain in left shoulder: Secondary | ICD-10-CM | POA: Diagnosis not present

## 2015-12-02 DIAGNOSIS — M25511 Pain in right shoulder: Secondary | ICD-10-CM | POA: Diagnosis not present

## 2015-12-02 DIAGNOSIS — M25611 Stiffness of right shoulder, not elsewhere classified: Secondary | ICD-10-CM | POA: Diagnosis not present

## 2015-12-04 DIAGNOSIS — M25612 Stiffness of left shoulder, not elsewhere classified: Secondary | ICD-10-CM | POA: Diagnosis not present

## 2015-12-04 DIAGNOSIS — M25512 Pain in left shoulder: Secondary | ICD-10-CM | POA: Diagnosis not present

## 2015-12-04 DIAGNOSIS — M25611 Stiffness of right shoulder, not elsewhere classified: Secondary | ICD-10-CM | POA: Diagnosis not present

## 2015-12-04 DIAGNOSIS — M25511 Pain in right shoulder: Secondary | ICD-10-CM | POA: Diagnosis not present

## 2015-12-07 ENCOUNTER — Other Ambulatory Visit: Payer: Self-pay | Admitting: Family Medicine

## 2015-12-09 DIAGNOSIS — M25511 Pain in right shoulder: Secondary | ICD-10-CM | POA: Diagnosis not present

## 2015-12-09 DIAGNOSIS — M25611 Stiffness of right shoulder, not elsewhere classified: Secondary | ICD-10-CM | POA: Diagnosis not present

## 2015-12-09 DIAGNOSIS — M25612 Stiffness of left shoulder, not elsewhere classified: Secondary | ICD-10-CM | POA: Diagnosis not present

## 2015-12-09 DIAGNOSIS — M25512 Pain in left shoulder: Secondary | ICD-10-CM | POA: Diagnosis not present

## 2015-12-17 DIAGNOSIS — M25512 Pain in left shoulder: Secondary | ICD-10-CM | POA: Diagnosis not present

## 2015-12-17 DIAGNOSIS — M25511 Pain in right shoulder: Secondary | ICD-10-CM | POA: Diagnosis not present

## 2015-12-17 DIAGNOSIS — M25612 Stiffness of left shoulder, not elsewhere classified: Secondary | ICD-10-CM | POA: Diagnosis not present

## 2015-12-17 DIAGNOSIS — M25611 Stiffness of right shoulder, not elsewhere classified: Secondary | ICD-10-CM | POA: Diagnosis not present

## 2015-12-23 DIAGNOSIS — M25511 Pain in right shoulder: Secondary | ICD-10-CM | POA: Diagnosis not present

## 2015-12-23 DIAGNOSIS — M25612 Stiffness of left shoulder, not elsewhere classified: Secondary | ICD-10-CM | POA: Diagnosis not present

## 2015-12-23 DIAGNOSIS — M25611 Stiffness of right shoulder, not elsewhere classified: Secondary | ICD-10-CM | POA: Diagnosis not present

## 2015-12-23 DIAGNOSIS — M25512 Pain in left shoulder: Secondary | ICD-10-CM | POA: Diagnosis not present

## 2015-12-24 ENCOUNTER — Other Ambulatory Visit: Payer: Self-pay

## 2015-12-24 DIAGNOSIS — E291 Testicular hypofunction: Secondary | ICD-10-CM

## 2015-12-25 ENCOUNTER — Other Ambulatory Visit: Payer: Medicare Other

## 2015-12-25 ENCOUNTER — Encounter: Payer: Self-pay | Admitting: Urology

## 2015-12-25 DIAGNOSIS — M25612 Stiffness of left shoulder, not elsewhere classified: Secondary | ICD-10-CM | POA: Diagnosis not present

## 2015-12-25 DIAGNOSIS — M25511 Pain in right shoulder: Secondary | ICD-10-CM | POA: Diagnosis not present

## 2015-12-25 DIAGNOSIS — M25512 Pain in left shoulder: Secondary | ICD-10-CM | POA: Diagnosis not present

## 2015-12-25 DIAGNOSIS — M25611 Stiffness of right shoulder, not elsewhere classified: Secondary | ICD-10-CM | POA: Diagnosis not present

## 2015-12-28 ENCOUNTER — Ambulatory Visit: Payer: Medicare Other | Admitting: Urology

## 2015-12-28 ENCOUNTER — Ambulatory Visit: Payer: Medicare Other

## 2015-12-28 DIAGNOSIS — M25512 Pain in left shoulder: Secondary | ICD-10-CM | POA: Diagnosis not present

## 2015-12-28 DIAGNOSIS — M25611 Stiffness of right shoulder, not elsewhere classified: Secondary | ICD-10-CM | POA: Diagnosis not present

## 2015-12-28 DIAGNOSIS — M25511 Pain in right shoulder: Secondary | ICD-10-CM | POA: Diagnosis not present

## 2015-12-28 DIAGNOSIS — E291 Testicular hypofunction: Secondary | ICD-10-CM | POA: Diagnosis not present

## 2015-12-28 DIAGNOSIS — M25612 Stiffness of left shoulder, not elsewhere classified: Secondary | ICD-10-CM | POA: Diagnosis not present

## 2015-12-28 MED ORDER — TESTOSTERONE CYPIONATE 200 MG/ML IM SOLN
200.0000 mg | Freq: Once | INTRAMUSCULAR | Status: AC
Start: 2015-12-28 — End: 2015-12-28
  Administered 2015-12-28: 200 mg via INTRAMUSCULAR

## 2015-12-28 NOTE — Progress Notes (Unsigned)
Testosterone IM Injection  Due to Hypogonadism patient is present today for a Testosterone Injection.  Medication: Testosterone Cypionate Dose: 45mL Location: left upper outer buttocks Lot: [id number] Exp:10/2016  Patient tolerated well, no complications were noted  Preformed by: Toniann Fail, LPN   Follow up: pt had labs drawn today prior to injection

## 2015-12-29 ENCOUNTER — Other Ambulatory Visit: Payer: Self-pay

## 2015-12-29 DIAGNOSIS — F4323 Adjustment disorder with mixed anxiety and depressed mood: Secondary | ICD-10-CM | POA: Diagnosis not present

## 2015-12-29 LAB — TESTOSTERONE: TESTOSTERONE: 44 ng/dL — AB (ref 264–916)

## 2015-12-29 LAB — HEMATOCRIT: HEMATOCRIT: 39.5 % (ref 37.5–51.0)

## 2015-12-29 MED ORDER — ZOLPIDEM TARTRATE 10 MG PO TABS: 5.0000 mg | ORAL_TABLET | Freq: Every evening | ORAL | 4 refills | Status: DC | PRN

## 2015-12-29 NOTE — Telephone Encounter (Signed)
Rx called in to pharmacy. 

## 2015-12-29 NOTE — Telephone Encounter (Signed)
Last ov 11/04/15

## 2015-12-29 NOTE — Telephone Encounter (Signed)
Please call in zolpidem  

## 2015-12-30 DIAGNOSIS — M25511 Pain in right shoulder: Secondary | ICD-10-CM | POA: Diagnosis not present

## 2015-12-30 DIAGNOSIS — M25512 Pain in left shoulder: Secondary | ICD-10-CM | POA: Diagnosis not present

## 2015-12-30 DIAGNOSIS — M25612 Stiffness of left shoulder, not elsewhere classified: Secondary | ICD-10-CM | POA: Diagnosis not present

## 2015-12-30 DIAGNOSIS — M25611 Stiffness of right shoulder, not elsewhere classified: Secondary | ICD-10-CM | POA: Diagnosis not present

## 2015-12-31 ENCOUNTER — Ambulatory Visit: Payer: Medicare Other

## 2016-01-05 DIAGNOSIS — M25611 Stiffness of right shoulder, not elsewhere classified: Secondary | ICD-10-CM | POA: Diagnosis not present

## 2016-01-05 DIAGNOSIS — M25512 Pain in left shoulder: Secondary | ICD-10-CM | POA: Diagnosis not present

## 2016-01-05 DIAGNOSIS — M25511 Pain in right shoulder: Secondary | ICD-10-CM | POA: Diagnosis not present

## 2016-01-05 DIAGNOSIS — M25612 Stiffness of left shoulder, not elsewhere classified: Secondary | ICD-10-CM | POA: Diagnosis not present

## 2016-01-07 DIAGNOSIS — M25512 Pain in left shoulder: Secondary | ICD-10-CM | POA: Diagnosis not present

## 2016-01-07 DIAGNOSIS — M25612 Stiffness of left shoulder, not elsewhere classified: Secondary | ICD-10-CM | POA: Diagnosis not present

## 2016-01-07 DIAGNOSIS — M25611 Stiffness of right shoulder, not elsewhere classified: Secondary | ICD-10-CM | POA: Diagnosis not present

## 2016-01-07 DIAGNOSIS — M25511 Pain in right shoulder: Secondary | ICD-10-CM | POA: Diagnosis not present

## 2016-01-19 DIAGNOSIS — M25612 Stiffness of left shoulder, not elsewhere classified: Secondary | ICD-10-CM | POA: Diagnosis not present

## 2016-01-19 DIAGNOSIS — M25511 Pain in right shoulder: Secondary | ICD-10-CM | POA: Diagnosis not present

## 2016-01-19 DIAGNOSIS — M25611 Stiffness of right shoulder, not elsewhere classified: Secondary | ICD-10-CM | POA: Diagnosis not present

## 2016-01-19 DIAGNOSIS — M25512 Pain in left shoulder: Secondary | ICD-10-CM | POA: Diagnosis not present

## 2016-01-25 DIAGNOSIS — M25611 Stiffness of right shoulder, not elsewhere classified: Secondary | ICD-10-CM | POA: Diagnosis not present

## 2016-01-25 DIAGNOSIS — M25511 Pain in right shoulder: Secondary | ICD-10-CM | POA: Diagnosis not present

## 2016-01-25 DIAGNOSIS — M25512 Pain in left shoulder: Secondary | ICD-10-CM | POA: Diagnosis not present

## 2016-01-25 DIAGNOSIS — M25612 Stiffness of left shoulder, not elsewhere classified: Secondary | ICD-10-CM | POA: Diagnosis not present

## 2016-01-27 ENCOUNTER — Ambulatory Visit: Payer: Medicare Other

## 2016-01-28 DIAGNOSIS — M25511 Pain in right shoulder: Secondary | ICD-10-CM | POA: Diagnosis not present

## 2016-01-28 DIAGNOSIS — M25611 Stiffness of right shoulder, not elsewhere classified: Secondary | ICD-10-CM | POA: Diagnosis not present

## 2016-01-29 DIAGNOSIS — Z23 Encounter for immunization: Secondary | ICD-10-CM | POA: Diagnosis not present

## 2016-02-01 ENCOUNTER — Ambulatory Visit: Payer: Medicare Other

## 2016-02-01 ENCOUNTER — Encounter: Payer: Self-pay | Admitting: Urology

## 2016-02-02 ENCOUNTER — Ambulatory Visit (INDEPENDENT_AMBULATORY_CARE_PROVIDER_SITE_OTHER): Payer: Medicare Other

## 2016-02-02 ENCOUNTER — Telehealth: Payer: Self-pay | Admitting: Family Medicine

## 2016-02-02 DIAGNOSIS — M25512 Pain in left shoulder: Secondary | ICD-10-CM | POA: Diagnosis not present

## 2016-02-02 DIAGNOSIS — M25611 Stiffness of right shoulder, not elsewhere classified: Secondary | ICD-10-CM | POA: Diagnosis not present

## 2016-02-02 DIAGNOSIS — E291 Testicular hypofunction: Secondary | ICD-10-CM

## 2016-02-02 DIAGNOSIS — M25511 Pain in right shoulder: Secondary | ICD-10-CM | POA: Diagnosis not present

## 2016-02-02 DIAGNOSIS — M25612 Stiffness of left shoulder, not elsewhere classified: Secondary | ICD-10-CM | POA: Diagnosis not present

## 2016-02-02 MED ORDER — TESTOSTERONE CYPIONATE 200 MG/ML IM SOLN
200.0000 mg | Freq: Once | INTRAMUSCULAR | Status: AC
  Administered 2016-02-02: 200 mg via INTRAMUSCULAR

## 2016-02-02 NOTE — Progress Notes (Signed)
Testosterone IM Injection  Due to Hypogonadism patient is present today for a Testosterone Injection.  Medication: Testosterone Cypionate Dose: 67mL Location: right upper outer buttocks Lot: [id number] Exp:10/2016  Patient tolerated well, no complications were noted  Preformed by: Toniann Fail, LPN   Follow up: 1 month

## 2016-02-02 NOTE — Telephone Encounter (Signed)
Patient states that he received his flu shot at [facility] on 01/28/2016 or 01/29/2016.  He could not remember which day he received the flu shot and may have threw away the paper they gave him.

## 2016-02-02 NOTE — Telephone Encounter (Signed)
Per pharmacy 01/29/2016. Flu documentation is being fax from pharmacy.

## 2016-02-03 DIAGNOSIS — H40003 Preglaucoma, unspecified, bilateral: Secondary | ICD-10-CM | POA: Diagnosis not present

## 2016-02-19 ENCOUNTER — Other Ambulatory Visit: Payer: Self-pay | Admitting: Family Medicine

## 2016-02-19 DIAGNOSIS — M5137 Other intervertebral disc degeneration, lumbosacral region: Secondary | ICD-10-CM

## 2016-02-19 MED ORDER — HYDROCORTISONE 2.5 % EX CREA: TOPICAL_CREAM | Freq: Two times a day (BID) | CUTANEOUS | 1 refills | Status: DC | PRN

## 2016-02-19 MED ORDER — HYDROCODONE-ACETAMINOPHEN 7.5-325 MG PO TABS: ORAL_TABLET | ORAL | 0 refills | Status: DC

## 2016-02-19 NOTE — Telephone Encounter (Signed)
Pease advise.

## 2016-02-19 NOTE — Telephone Encounter (Signed)
Called patient to get more information. Patient states that a a few months ago he was seen by a Dermatologist in [place] who prescribed Hydrocortisone cream 2.5% apply daily as needed to affected area. Patient states he no longer sees that Dermatologist anymore and is going to establish with a local one in [place]. Patient request that Dr. Caryn Section refill this medication. Patient also wants a refill on Hydrocodone- Acetaminophen. He states he usually gets 3 separate prescriptions for Hydrocodone to last him 3 months. Call patient when ready for pick up. Patient says the Hydrocortisone cream can be sent into [facility].

## 2016-02-19 NOTE — Telephone Encounter (Signed)
Pt called back and does not want the Rx's called into [facility].  He wants to pick up three seperate prescriptions so he can get the filled one month  a time.  Pt's call back [phone]  Thank sTeri

## 2016-02-19 NOTE — Telephone Encounter (Signed)
Pt states he has rec'd hydrocortisone from a dermatologist in [facility] for irritation on his neck caused by shaving. Pt is requesting a refill sent to [facility].  [id number]

## 2016-02-23 DIAGNOSIS — M25511 Pain in right shoulder: Secondary | ICD-10-CM | POA: Diagnosis not present

## 2016-02-23 DIAGNOSIS — M25611 Stiffness of right shoulder, not elsewhere classified: Secondary | ICD-10-CM | POA: Diagnosis not present

## 2016-02-23 DIAGNOSIS — M25612 Stiffness of left shoulder, not elsewhere classified: Secondary | ICD-10-CM | POA: Diagnosis not present

## 2016-02-23 DIAGNOSIS — M25512 Pain in left shoulder: Secondary | ICD-10-CM | POA: Diagnosis not present

## 2016-02-25 DIAGNOSIS — M25611 Stiffness of right shoulder, not elsewhere classified: Secondary | ICD-10-CM | POA: Diagnosis not present

## 2016-02-25 DIAGNOSIS — M25512 Pain in left shoulder: Secondary | ICD-10-CM | POA: Diagnosis not present

## 2016-02-25 DIAGNOSIS — M25612 Stiffness of left shoulder, not elsewhere classified: Secondary | ICD-10-CM | POA: Diagnosis not present

## 2016-02-25 DIAGNOSIS — M25511 Pain in right shoulder: Secondary | ICD-10-CM | POA: Diagnosis not present

## 2016-02-29 DIAGNOSIS — M25611 Stiffness of right shoulder, not elsewhere classified: Secondary | ICD-10-CM | POA: Diagnosis not present

## 2016-02-29 DIAGNOSIS — M25511 Pain in right shoulder: Secondary | ICD-10-CM | POA: Diagnosis not present

## 2016-03-01 ENCOUNTER — Ambulatory Visit (INDEPENDENT_AMBULATORY_CARE_PROVIDER_SITE_OTHER): Payer: Medicare Other

## 2016-03-01 DIAGNOSIS — E291 Testicular hypofunction: Secondary | ICD-10-CM

## 2016-03-01 MED ORDER — TESTOSTERONE CYPIONATE 200 MG/ML IM SOLN
200.0000 mg | Freq: Once | INTRAMUSCULAR | Status: AC
  Administered 2016-03-01: 200 mg via INTRAMUSCULAR

## 2016-03-01 NOTE — Progress Notes (Signed)
Testosterone IM Injection  Due to Hypogonadism patient is present today for a Testosterone Injection.  Medication: Testosterone Cypionate Dose: 32ml Location: left upper outer buttocks Lot: [id number] Exp:10/2016  Patient tolerated well, no complications were noted  Preformed by: Fonnie Jarvis, CMA  Follow up: 1 month

## 2016-03-02 DIAGNOSIS — M25511 Pain in right shoulder: Secondary | ICD-10-CM | POA: Diagnosis not present

## 2016-03-02 DIAGNOSIS — M25611 Stiffness of right shoulder, not elsewhere classified: Secondary | ICD-10-CM | POA: Diagnosis not present

## 2016-03-02 DIAGNOSIS — M25612 Stiffness of left shoulder, not elsewhere classified: Secondary | ICD-10-CM | POA: Diagnosis not present

## 2016-03-02 DIAGNOSIS — M25512 Pain in left shoulder: Secondary | ICD-10-CM | POA: Diagnosis not present

## 2016-03-07 DIAGNOSIS — M25611 Stiffness of right shoulder, not elsewhere classified: Secondary | ICD-10-CM | POA: Diagnosis not present

## 2016-03-07 DIAGNOSIS — M25511 Pain in right shoulder: Secondary | ICD-10-CM | POA: Diagnosis not present

## 2016-03-08 DIAGNOSIS — F4323 Adjustment disorder with mixed anxiety and depressed mood: Secondary | ICD-10-CM | POA: Diagnosis not present

## 2016-03-09 DIAGNOSIS — M6281 Muscle weakness (generalized): Secondary | ICD-10-CM | POA: Diagnosis not present

## 2016-03-14 ENCOUNTER — Other Ambulatory Visit: Payer: Self-pay | Admitting: Family Medicine

## 2016-03-14 DIAGNOSIS — M25512 Pain in left shoulder: Secondary | ICD-10-CM | POA: Diagnosis not present

## 2016-03-14 DIAGNOSIS — M25511 Pain in right shoulder: Secondary | ICD-10-CM | POA: Diagnosis not present

## 2016-03-14 DIAGNOSIS — M25612 Stiffness of left shoulder, not elsewhere classified: Secondary | ICD-10-CM | POA: Diagnosis not present

## 2016-03-14 DIAGNOSIS — M25611 Stiffness of right shoulder, not elsewhere classified: Secondary | ICD-10-CM | POA: Diagnosis not present

## 2016-03-16 DIAGNOSIS — L570 Actinic keratosis: Secondary | ICD-10-CM | POA: Diagnosis not present

## 2016-03-16 DIAGNOSIS — L820 Inflamed seborrheic keratosis: Secondary | ICD-10-CM | POA: Diagnosis not present

## 2016-03-16 DIAGNOSIS — L739 Follicular disorder, unspecified: Secondary | ICD-10-CM | POA: Diagnosis not present

## 2016-03-16 DIAGNOSIS — Z1283 Encounter for screening for malignant neoplasm of skin: Secondary | ICD-10-CM | POA: Diagnosis not present

## 2016-03-16 DIAGNOSIS — L821 Other seborrheic keratosis: Secondary | ICD-10-CM | POA: Diagnosis not present

## 2016-03-16 DIAGNOSIS — L578 Other skin changes due to chronic exposure to nonionizing radiation: Secondary | ICD-10-CM | POA: Diagnosis not present

## 2016-03-16 DIAGNOSIS — D1800 Hemangioma unspecified site: Secondary | ICD-10-CM | POA: Diagnosis not present

## 2016-03-16 DIAGNOSIS — D692 Other nonthrombocytopenic purpura: Secondary | ICD-10-CM | POA: Diagnosis not present

## 2016-03-18 DIAGNOSIS — M533 Sacrococcygeal disorders, not elsewhere classified: Secondary | ICD-10-CM | POA: Diagnosis not present

## 2016-03-28 ENCOUNTER — Ambulatory Visit: Payer: Medicare Other | Admitting: Family Medicine

## 2016-03-30 ENCOUNTER — Ambulatory Visit (INDEPENDENT_AMBULATORY_CARE_PROVIDER_SITE_OTHER): Payer: Medicare Other | Admitting: Family Medicine

## 2016-03-30 ENCOUNTER — Encounter: Payer: Self-pay | Admitting: Family Medicine

## 2016-03-30 ENCOUNTER — Ambulatory Visit: Payer: Medicare Other

## 2016-03-30 VITALS — BP 94/44 | HR 57 | Temp 98.7°F | Resp 16 | Wt 156.0 lb

## 2016-03-30 DIAGNOSIS — R05 Cough: Secondary | ICD-10-CM

## 2016-03-30 DIAGNOSIS — J069 Acute upper respiratory infection, unspecified: Secondary | ICD-10-CM | POA: Diagnosis not present

## 2016-03-30 DIAGNOSIS — I209 Angina pectoris, unspecified: Secondary | ICD-10-CM | POA: Diagnosis not present

## 2016-03-30 DIAGNOSIS — R059 Cough, unspecified: Secondary | ICD-10-CM

## 2016-03-30 NOTE — Progress Notes (Signed)
Patient: Ivan Salazar Male    DOB: 1931-06-11   82 y.o.   MRN: [id number] Visit Date: 03/30/2016  Today's Provider: Lelon Huh, MD   Chief Complaint  Patient presents with  . Cough   Subjective:    Cough and chest congestion for 1 week. Patient also has nasal congestion and ear congestion. Patient has been taking otc cough syrup and Flonase with mild relief.    Cough  This is a new problem. The current episode started in the past 7 days. The problem has been unchanged. The cough is productive of sputum. Associated symptoms include nasal congestion, postnasal drip, rhinorrhea, a sore throat and sweats. Pertinent negatives include no chest pain, chills, ear congestion, ear pain, fever, headaches, heartburn, hemoptysis, myalgias, rash, shortness of breath or weight loss. Nothing aggravates the symptoms. He has tried OTC cough suppressant (otc cough syrup and flonase) for the symptoms. The treatment provided mild relief. There is no history of bronchitis or pneumonia.  Returned from [place] last weekend and cough has been getting progressively worse. Just started using old Flonase prescription yesterday. Also has prescription for OTC guaifenesin/codeine which has helped cough quite a bit. No fevers, chills or sweats.     Allergies  Allergen Reactions  . Celebrex [Celecoxib]     Noxius taste in mouth     Current Outpatient Prescriptions:  .  atorvastatin (LIPITOR) 80 MG tablet, TAKE 1 TABLET DAILY, Disp: 90 tablet, Rfl: 3 .  clopidogrel (PLAVIX) 75 MG tablet, TAKE 1 TABLET BY MOUTH DAILY, Disp: 90 tablet, Rfl: 4 .  enalapril (VASOTEC) 5 MG tablet, 1 TABLET, ORAL, DAILY, Disp: 90 tablet, Rfl: 4 .  fluticasone (FLONASE) 50 MCG/ACT nasal spray, Place 2 sprays into both nostrils daily., Disp: 16 g, Rfl: 6 .  HYDROcodone-acetaminophen (NORCO) 7.5-325 MG tablet, 1 tablet every 4-6 hours as needed, Disp: 180 tablet, Rfl: 0 .  hydrocortisone 2.5 % cream, Apply topically 2 (two)  times daily as needed., Disp: 30 g, Rfl: 1 .  latanoprost (XALATAN) 0.005 % ophthalmic solution, , Disp: , Rfl:  .  loratadine (CLARITIN) 10 MG tablet, Take 10 mg by mouth daily., Disp: , Rfl:  .  metoprolol tartrate (LOPRESSOR) 25 MG tablet, Take 1 tablet (25 mg total) by mouth 2 (two) times daily., Disp: 180 tablet, Rfl: 4 .  nitroGLYCERIN (NITROSTAT) 0.4 MG SL tablet, Place 1 tablet (0.4 mg total) under the tongue every 5 (five) minutes as needed for chest pain., Disp: 20 tablet, Rfl: 1 .  ranitidine (ZANTAC) 300 MG tablet, TAKE 1 TABLET TWICE DAILY, Disp: 180 tablet, Rfl: 4 .  testosterone cypionate (DEPOTESTOSTERONE CYPIONATE) 200 MG/ML injection, Inject 1 mL (200 mg total) into the muscle every 28 (twenty-eight) days., Disp: 10 mL, Rfl: 0 .  zolpidem (AMBIEN) 10 MG tablet, Take 0.5 tablets (5 mg total) by mouth at bedtime as needed., Disp: 30 tablet, Rfl: 4  Review of Systems  Constitutional: Negative for appetite change, chills, fever and weight loss.  HENT: Positive for congestion, postnasal drip, rhinorrhea, sore throat and voice change. Negative for ear pain.   Respiratory: Positive for cough. Negative for hemoptysis, chest tightness and shortness of breath.   Cardiovascular: Negative for chest pain and palpitations.  Gastrointestinal: Negative for abdominal pain, heartburn, nausea and vomiting.  Musculoskeletal: Negative for myalgias.  Skin: Negative for rash.  Neurological: Negative for headaches.    Social History  Substance Use Topics  . Smoking status: Former Smoker  Packs/day: 0.50    Years: 20.00    Quit date: 05/01/1977  . Smokeless tobacco: Never Used  . Alcohol use Yes     Comment: moderate; 2 drinks daily   Objective:   BP (!) 94/44 (BP Location: Right Arm, Patient Position: Sitting, Cuff Size: Normal)   Pulse (!) 57   Temp 98.7 F (37.1 C) (Oral)   Resp 16   Wt 156 lb (70.8 kg)   SpO2 97%   BMI 22.38 kg/m   Physical Exam  General Appearance:     Alert, cooperative, no distress  HENT:   bilateral TM normal without fluid or infection, neck without nodes, sinuses nontender, nasal mucosa congested and nasal mucosa pale and congested  Eyes:    PERRL, conjunctiva/corneas clear, EOM's intact       Lungs:     Clear to auscultation bilaterally, respirations unlabored  Heart:    Regular rate and rhythm  Neurologic:   Awake, alert, oriented x 3. No apparent focal neurological           defect.           Assessment & Plan:     1. Upper respiratory tract infection, unspecified type Counseled regarding signs and symptoms of viral and bacterial respiratory infections. Advised to call or return for additional evaluation if he develops any sign of bacterial infection, or if current symptoms last longer than 10 days.         Lelon Huh, MD  [facility]

## 2016-03-30 NOTE — Patient Instructions (Signed)
Upper Respiratory Infection, Adult Most upper respiratory infections (URIs) are a viral infection of the air passages leading to the lungs. A URI affects the nose, throat, and upper air passages. The most common type of URI is nasopharyngitis and is typically referred to as "the common cold." URIs run their course and usually go away on their own. Most of the time, a URI does not require medical attention, but sometimes a bacterial infection in the upper airways can follow a viral infection. This is called a secondary infection. Sinus and middle ear infections are common types of secondary upper respiratory infections. Bacterial pneumonia can also complicate a URI. A URI can worsen asthma and chronic obstructive pulmonary disease (COPD). Sometimes, these complications can require emergency medical care and may be life threatening. What are the causes? Almost all URIs are caused by viruses. A virus is a type of germ and can spread from one person to another. What increases the risk? You may be at risk for a URI if:  You smoke.  You have chronic heart or lung disease.  You have a weakened defense (immune) system.  You are very [place] or very old.  You have nasal allergies or asthma.  You work in crowded or poorly ventilated areas.  You work in health care facilities or schools.  What are the signs or symptoms? Symptoms typically develop 2-3 days after you come in contact with a cold virus. Most viral URIs last 7-10 days. However, viral URIs from the influenza virus (flu virus) can last 14-18 days and are typically more severe. Symptoms may include:  Runny or stuffy (congested) nose.  Sneezing.  Cough.  Sore throat.  Headache.  Fatigue.  Fever.  Loss of appetite.  Pain in your forehead, behind your eyes, and over your cheekbones (sinus pain).  Muscle aches.  How is this diagnosed? Your health care provider may diagnose a URI by:  Physical exam.  Tests to check that your  symptoms are not due to another condition such as: ? Strep throat. ? Sinusitis. ? Pneumonia. ? Asthma.  How is this treated? A URI goes away on its own with time. It cannot be cured with medicines, but medicines may be prescribed or recommended to relieve symptoms. Medicines may help:  Reduce your fever.  Reduce your cough.  Relieve nasal congestion.  Follow these instructions at home:  Take medicines only as directed by your health care provider.  Gargle warm saltwater or take cough drops to comfort your throat as directed by your health care provider.  Use a warm mist humidifier or inhale steam from a shower to increase air moisture. This may make it easier to breathe.  Drink enough fluid to keep your urine clear or pale yellow.  Eat soups and other clear broths and maintain good nutrition.  Rest as needed.  Return to work when your temperature has returned to normal or as your health care provider advises. You may need to stay home longer to avoid infecting others. You can also use a face mask and careful hand washing to prevent spread of the virus.  Increase the usage of your inhaler if you have asthma.  Do not use any tobacco products, including cigarettes, chewing tobacco, or electronic cigarettes. If you need help quitting, ask your health care provider. How is this prevented? The best way to protect yourself from getting a cold is to practice good hygiene.  Avoid oral or hand contact with people with cold symptoms.  Wash your   hands often if contact occurs.  There is no clear evidence that vitamin C, vitamin E, echinacea, or exercise reduces the chance of developing a cold. However, it is always recommended to get plenty of rest, exercise, and practice good nutrition. Contact a health care provider if:  You are getting worse rather than better.  Your symptoms are not controlled by medicine.  You have chills.  You have worsening shortness of breath.  You have  brown or red mucus.  You have yellow or brown nasal discharge.  You have pain in your face, especially when you bend forward.  You have a fever.  You have swollen neck glands.  You have pain while swallowing.  You have white areas in the back of your throat. Get help right away if:  You have severe or persistent: ? Headache. ? Ear pain. ? Sinus pain. ? Chest pain.  You have chronic lung disease and any of the following: ? Wheezing. ? Prolonged cough. ? Coughing up blood. ? A change in your usual mucus.  You have a stiff neck.  You have changes in your: ? Vision. ? Hearing. ? Thinking. ? Mood. This information is not intended to replace advice given to you by your health care provider. Make sure you discuss any questions you have with your health care provider. Document Released: 10/12/2000 Document Revised: 12/20/2015 Document Reviewed: 07/24/2013 Elsevier Interactive Patient Education  2017 Elsevier Inc.  

## 2016-03-31 ENCOUNTER — Other Ambulatory Visit: Payer: Self-pay | Admitting: Family Medicine

## 2016-03-31 MED ORDER — GUAIFENESIN-CODEINE 100-10 MG/5ML PO SOLN: 10.0000 mL | Freq: Four times a day (QID) | ORAL | 0 refills | Status: DC | PRN

## 2016-03-31 NOTE — Telephone Encounter (Signed)
Please advise 

## 2016-03-31 NOTE — Telephone Encounter (Signed)
Prescription was called in and patient notifiedKW

## 2016-03-31 NOTE — Telephone Encounter (Signed)
Pt stated when he had his OV on 03/30/16 he and Dr. Caryn Section discussed taking guaifenesin/codeine. Pt is requesting and Rx for guaifenesin/codeine be sent to [facility] before the weekend. Please advise. Thanks TNP

## 2016-03-31 NOTE — Telephone Encounter (Signed)
Please call in codeine guaifenesin

## 2016-04-06 ENCOUNTER — Ambulatory Visit (INDEPENDENT_AMBULATORY_CARE_PROVIDER_SITE_OTHER): Payer: Medicare Other

## 2016-04-06 DIAGNOSIS — E291 Testicular hypofunction: Secondary | ICD-10-CM

## 2016-04-06 MED ORDER — TESTOSTERONE CYPIONATE 200 MG/ML IM SOLN
200.0000 mg | Freq: Once | INTRAMUSCULAR | Status: AC
  Administered 2016-04-06: 200 mg via INTRAMUSCULAR

## 2016-04-06 NOTE — Progress Notes (Signed)
Testosterone IM Injection  Due to Hypogonadism patient is present today for a Testosterone Injection.  Medication: Testosterone Cypionate Dose: 26mL Location: left upper outer buttocks Lot: [id number] Exp:10/2016  Patient tolerated well, no complications were noted  Preformed by: Toniann Fail, LPN   Follow up: 4-5 weeks

## 2016-04-14 ENCOUNTER — Other Ambulatory Visit: Payer: Self-pay | Admitting: Family Medicine

## 2016-04-14 NOTE — Telephone Encounter (Signed)
Last prescribed 03/31/16, please review and advise. KW

## 2016-04-14 NOTE — Telephone Encounter (Signed)
Please see if he has any fever, chills, sweats, or shortness of breath, or if cough is getting worse. If so then he needs to come in for recheck.   Otherwise can call in refill for -guaifenesin-codein

## 2016-04-14 NOTE — Telephone Encounter (Signed)
Pt needs refill on  His cough medication  He uses [name]  Pt call back is [phone]  Thanks, Con Memos

## 2016-04-14 NOTE — Telephone Encounter (Signed)
I called patient. Patient is not having any fever, chills, sweats, shortness of breath or worsening cough. He says he still has the same cough and some congestion. Refill for Guaifenesin- Codeine called into pharmacy.

## 2016-04-15 MED ORDER — GUAIFENESIN-CODEINE 100-10 MG/5ML PO SOLN: 10.0000 mL | Freq: Four times a day (QID) | ORAL | 0 refills | Status: DC | PRN

## 2016-04-26 ENCOUNTER — Other Ambulatory Visit: Payer: Self-pay | Admitting: Family Medicine

## 2016-04-27 NOTE — Telephone Encounter (Signed)
Rx called in to pharmacy. 

## 2016-04-27 NOTE — Telephone Encounter (Signed)
Please call in zolpidem  

## 2016-05-04 DIAGNOSIS — M25511 Pain in right shoulder: Secondary | ICD-10-CM | POA: Diagnosis not present

## 2016-05-04 DIAGNOSIS — M25611 Stiffness of right shoulder, not elsewhere classified: Secondary | ICD-10-CM | POA: Diagnosis not present

## 2016-05-06 ENCOUNTER — Telehealth: Payer: Self-pay | Admitting: Family Medicine

## 2016-05-06 DIAGNOSIS — M25611 Stiffness of right shoulder, not elsewhere classified: Secondary | ICD-10-CM | POA: Diagnosis not present

## 2016-05-06 DIAGNOSIS — E785 Hyperlipidemia, unspecified: Secondary | ICD-10-CM

## 2016-05-06 DIAGNOSIS — D509 Iron deficiency anemia, unspecified: Secondary | ICD-10-CM

## 2016-05-06 DIAGNOSIS — I2510 Atherosclerotic heart disease of native coronary artery without angina pectoris: Secondary | ICD-10-CM

## 2016-05-06 DIAGNOSIS — M25511 Pain in right shoulder: Secondary | ICD-10-CM | POA: Diagnosis not present

## 2016-05-06 NOTE — Telephone Encounter (Signed)
Called Pt to schedule AWV with [facility] - knb °

## 2016-05-10 ENCOUNTER — Encounter: Payer: Self-pay | Admitting: Family Medicine

## 2016-05-10 ENCOUNTER — Ambulatory Visit (INDEPENDENT_AMBULATORY_CARE_PROVIDER_SITE_OTHER): Payer: Medicare Other | Admitting: Family Medicine

## 2016-05-10 VITALS — BP 108/58 | HR 50 | Temp 98.8°F | Resp 16 | Wt 158.0 lb

## 2016-05-10 DIAGNOSIS — R05 Cough: Secondary | ICD-10-CM | POA: Diagnosis not present

## 2016-05-10 DIAGNOSIS — M5137 Other intervertebral disc degeneration, lumbosacral region: Secondary | ICD-10-CM

## 2016-05-10 DIAGNOSIS — R053 Chronic cough: Secondary | ICD-10-CM

## 2016-05-10 MED ORDER — HYDROCODONE-ACETAMINOPHEN 7.5-325 MG PO TABS: ORAL_TABLET | ORAL | 0 refills | Status: DC

## 2016-05-10 MED ORDER — GUAIFENESIN-CODEINE 100-10 MG/5ML PO SOLN: 5.0000 mL | Freq: Four times a day (QID) | ORAL | 1 refills | Status: DC | PRN

## 2016-05-10 NOTE — Patient Instructions (Signed)
Recommend using humidifier to keep humidity in home between 30%-40%

## 2016-05-10 NOTE — Progress Notes (Signed)
Patient: Ivan Salazar Male    DOB: 1932-04-10   83 y.o.   MRN: [id number] Visit Date: 05/10/2016  Today's Provider: Lelon Huh, MD   Chief Complaint  Patient presents with  . Cough    follow up    Subjective:    HPI Patient comes in today for a follow up on his cough. Patient was last seen on 03/30/2016. Patient called to the office on 04/14/2016 with similar symptoms and he was prescribed Guaifenesin with codeine which he reports helped. Cough had improved quite a bit but has flared up again since weather turned colder.   Also follow up for pain medications which he states continues to work well and is due for refill.     Allergies  Allergen Reactions  . Celebrex [Celecoxib]     Noxius taste in mouth     Current Outpatient Prescriptions:  .  atorvastatin (LIPITOR) 80 MG tablet, TAKE 1 TABLET DAILY, Disp: 90 tablet, Rfl: 3 .  clopidogrel (PLAVIX) 75 MG tablet, TAKE 1 TABLET BY MOUTH DAILY, Disp: 90 tablet, Rfl: 4 .  enalapril (VASOTEC) 5 MG tablet, 1 TABLET, ORAL, DAILY, Disp: 90 tablet, Rfl: 4 .  fluticasone (FLONASE) 50 MCG/ACT nasal spray, Place 2 sprays into both nostrils daily., Disp: 16 g, Rfl: 6 .  guaiFENesin-codeine 100-10 MG/5ML syrup, Take 10 mLs by mouth every 6 (six) hours as needed for cough., Disp: 180 mL, Rfl: 0 .  HYDROcodone-acetaminophen (NORCO) 7.5-325 MG tablet, 1 tablet every 4-6 hours as needed, Disp: 180 tablet, Rfl: 0 .  hydrocortisone 2.5 % cream, Apply topically 2 (two) times daily as needed., Disp: 30 g, Rfl: 1 .  latanoprost (XALATAN) 0.005 % ophthalmic solution, , Disp: , Rfl:  .  loratadine (CLARITIN) 10 MG tablet, Take 10 mg by mouth daily., Disp: , Rfl:  .  metoprolol tartrate (LOPRESSOR) 25 MG tablet, Take 1 tablet (25 mg total) by mouth 2 (two) times daily., Disp: 180 tablet, Rfl: 4 .  nitroGLYCERIN (NITROSTAT) 0.4 MG SL tablet, Place 1 tablet (0.4 mg total) under the tongue every 5 (five) minutes as needed for chest pain., Disp: 20  tablet, Rfl: 1 .  ranitidine (ZANTAC) 300 MG tablet, TAKE 1 TABLET TWICE DAILY, Disp: 180 tablet, Rfl: 4 .  testosterone cypionate (DEPOTESTOSTERONE CYPIONATE) 200 MG/ML injection, Inject 1 mL (200 mg total) into the muscle every 28 (twenty-eight) days., Disp: 10 mL, Rfl: 0 .  zolpidem (AMBIEN) 10 MG tablet, TAKE 1/2 TABLET  EVERY NIGHT AT BEDTIME, Disp: 30 tablet, Rfl: 3  Review of Systems  Constitutional: Negative for activity change, appetite change, chills, diaphoresis, fatigue, fever and unexpected weight change.  Respiratory: Positive for cough and shortness of breath.   Cardiovascular: Negative.   Neurological: Negative for headaches.    Social History  Substance Use Topics  . Smoking status: Former Smoker    Packs/day: 0.50    Years: 20.00    Quit date: 05/01/1977  . Smokeless tobacco: Never Used  . Alcohol use Yes     Comment: moderate; 2 drinks daily   Objective:   BP (!) 108/58 (BP Location: Left Arm, Patient Position: Sitting, Cuff Size: Normal)   Pulse (!) 50   Temp 98.8 F (37.1 C)   Resp 16   Wt 158 lb (71.7 kg)   SpO2 95%   BMI 22.67 kg/m   Physical Exam  General Appearance:    Alert, cooperative, no distress  HENT:   bilateral TM  normal without fluid or infection, neck without nodes, sinuses nontender and post nasal drip noted  Eyes:    PERRL, conjunctiva/corneas clear, EOM's intact       Lungs:     Clear to auscultation bilaterally, respirations unlabored  Heart:    Regular rate and rhythm  Neurologic:   Awake, alert, oriented x 3. No apparent focal neurological           defect.           Assessment & Plan:     1. Chronic cough Doing well with occasional use of codeine cough syrup which was refilled. Also recommended he use humidifier.  - guaiFENesin-codeine 100-10 MG/5ML syrup; Take 5-10 mLs by mouth every 6 (six) hours as needed for cough.  Dispense: 180 mL; Refill: 1  2. DDD (degenerative disc disease), lumbosacral Pain adequately controlled  on current medications.  - HYDROcodone-acetaminophen (NORCO) 7.5-325 MG tablet; 1 tablet every 4-6 hours as needed  Dispense: 180 tablet; Refill: 0       Lelon Huh, MD  [facility]

## 2016-05-11 ENCOUNTER — Ambulatory Visit (INDEPENDENT_AMBULATORY_CARE_PROVIDER_SITE_OTHER): Payer: Medicare Other | Admitting: Family Medicine

## 2016-05-11 DIAGNOSIS — M25612 Stiffness of left shoulder, not elsewhere classified: Secondary | ICD-10-CM | POA: Diagnosis not present

## 2016-05-11 DIAGNOSIS — E291 Testicular hypofunction: Secondary | ICD-10-CM

## 2016-05-11 DIAGNOSIS — M25512 Pain in left shoulder: Secondary | ICD-10-CM | POA: Diagnosis not present

## 2016-05-11 DIAGNOSIS — M25611 Stiffness of right shoulder, not elsewhere classified: Secondary | ICD-10-CM | POA: Diagnosis not present

## 2016-05-11 DIAGNOSIS — M25511 Pain in right shoulder: Secondary | ICD-10-CM | POA: Diagnosis not present

## 2016-05-11 MED ORDER — TESTOSTERONE CYPIONATE 200 MG/ML IM SOLN
200.0000 mg | Freq: Once | INTRAMUSCULAR | Status: AC
  Administered 2016-05-11: 200 mg via INTRAMUSCULAR

## 2016-05-11 NOTE — Progress Notes (Signed)
Testosterone IM Injection  Due to Hypogonadism patient is present today for a Testosterone Injection.  Medication: Testosterone Cypionate Dose: 1ML Location: right upper outer buttocks Lot: [id number] Exp:10/2016  Patient tolerated well, no complications were noted  Preformed by: Elberta Leatherwood, CMA  Follow up: 4 weeks

## 2016-05-11 NOTE — Telephone Encounter (Signed)
Patient will be returning next week to pick up lab order. Left at front desk.

## 2016-05-12 DIAGNOSIS — D509 Iron deficiency anemia, unspecified: Secondary | ICD-10-CM | POA: Diagnosis not present

## 2016-05-12 DIAGNOSIS — I2510 Atherosclerotic heart disease of native coronary artery without angina pectoris: Secondary | ICD-10-CM | POA: Diagnosis not present

## 2016-05-12 DIAGNOSIS — E785 Hyperlipidemia, unspecified: Secondary | ICD-10-CM | POA: Diagnosis not present

## 2016-05-13 ENCOUNTER — Telehealth: Payer: Self-pay

## 2016-05-13 DIAGNOSIS — M25612 Stiffness of left shoulder, not elsewhere classified: Secondary | ICD-10-CM | POA: Diagnosis not present

## 2016-05-13 DIAGNOSIS — M25512 Pain in left shoulder: Secondary | ICD-10-CM | POA: Diagnosis not present

## 2016-05-13 DIAGNOSIS — M25511 Pain in right shoulder: Secondary | ICD-10-CM | POA: Diagnosis not present

## 2016-05-13 DIAGNOSIS — M25611 Stiffness of right shoulder, not elsewhere classified: Secondary | ICD-10-CM | POA: Diagnosis not present

## 2016-05-13 LAB — COMPREHENSIVE METABOLIC PANEL
ALK PHOS: 80 IU/L (ref 39–117)
ALT: 35 IU/L (ref 0–44)
AST: 37 IU/L (ref 0–40)
Albumin/Globulin Ratio: 2 (ref 1.2–2.2)
Albumin: 4.3 g/dL (ref 3.5–4.7)
BILIRUBIN TOTAL: 1.3 mg/dL — AB (ref 0.0–1.2)
BUN/Creatinine Ratio: 15 (ref 10–24)
BUN: 20 mg/dL (ref 8–27)
CHLORIDE: 102 mmol/L (ref 96–106)
CO2: 25 mmol/L (ref 18–29)
Calcium: 8.9 mg/dL (ref 8.6–10.2)
Creatinine, Ser: 1.3 mg/dL — ABNORMAL HIGH (ref 0.76–1.27)
GFR calc Af Amer: 59 mL/min/{1.73_m2} — ABNORMAL LOW (ref >59)
GFR calc non Af Amer: 51 mL/min/{1.73_m2} — ABNORMAL LOW (ref >59)
GLUCOSE: 88 mg/dL (ref 65–99)
Globulin, Total: 2.2 g/dL (ref 1.5–4.5)
Potassium: 5.1 mmol/L (ref 3.5–5.2)
Sodium: 143 mmol/L (ref 134–144)
Total Protein: 6.5 g/dL (ref 6.0–8.5)

## 2016-05-13 LAB — LIPID PANEL
CHOLESTEROL TOTAL: 148 mg/dL (ref 100–199)
Chol/HDL Ratio: 2.5 ratio units (ref 0.0–5.0)
HDL: 60 mg/dL (ref >39)
LDL Calculated: 71 mg/dL (ref 0–99)
Triglycerides: 83 mg/dL (ref 0–149)
VLDL CHOLESTEROL CAL: 17 mg/dL (ref 5–40)

## 2016-05-13 LAB — CBC
Hematocrit: 38.4 % (ref 37.5–51.0)
Hemoglobin: 12.6 g/dL — ABNORMAL LOW (ref 13.0–17.7)
MCH: 32.2 pg (ref 26.6–33.0)
MCHC: 32.8 g/dL (ref 31.5–35.7)
MCV: 98 fL — ABNORMAL HIGH (ref 79–97)
PLATELETS: 162 10*3/uL (ref 150–379)
RBC: 3.91 x10E6/uL — ABNORMAL LOW (ref 4.14–5.80)
RDW: 13.7 % (ref 12.3–15.4)
WBC: 4.1 10*3/uL (ref 3.4–10.8)

## 2016-05-13 NOTE — Telephone Encounter (Signed)
-----   Message from Birdie Sons, MD sent at 05/13/2016  7:41 AM EST ----- Blood sugar, kidney functions, electrolytes and cholesterol are all normal. Continue current medications.  Check labs yearly.

## 2016-05-13 NOTE — Telephone Encounter (Signed)
Pt is returning call.  Pt is also requesting to pick up a copy of his results.  [id number]

## 2016-05-13 NOTE — Telephone Encounter (Signed)
Lab printed 

## 2016-05-13 NOTE — Telephone Encounter (Signed)
Left detailed message on pt's vm informing him that lab results are ready to pick-up.

## 2016-05-13 NOTE — Telephone Encounter (Signed)
[id number] 

## 2016-05-17 DIAGNOSIS — F4323 Adjustment disorder with mixed anxiety and depressed mood: Secondary | ICD-10-CM | POA: Diagnosis not present

## 2016-05-25 DIAGNOSIS — M25612 Stiffness of left shoulder, not elsewhere classified: Secondary | ICD-10-CM | POA: Diagnosis not present

## 2016-05-25 DIAGNOSIS — M25511 Pain in right shoulder: Secondary | ICD-10-CM | POA: Diagnosis not present

## 2016-05-25 DIAGNOSIS — M25512 Pain in left shoulder: Secondary | ICD-10-CM | POA: Diagnosis not present

## 2016-05-25 DIAGNOSIS — M25611 Stiffness of right shoulder, not elsewhere classified: Secondary | ICD-10-CM | POA: Diagnosis not present

## 2016-05-26 DIAGNOSIS — Z951 Presence of aortocoronary bypass graft: Secondary | ICD-10-CM | POA: Diagnosis not present

## 2016-05-26 DIAGNOSIS — E782 Mixed hyperlipidemia: Secondary | ICD-10-CM | POA: Diagnosis not present

## 2016-05-26 DIAGNOSIS — I2510 Atherosclerotic heart disease of native coronary artery without angina pectoris: Secondary | ICD-10-CM | POA: Diagnosis not present

## 2016-05-26 DIAGNOSIS — Z6822 Body mass index (BMI) 22.0-22.9, adult: Secondary | ICD-10-CM | POA: Diagnosis not present

## 2016-06-01 DIAGNOSIS — M25611 Stiffness of right shoulder, not elsewhere classified: Secondary | ICD-10-CM | POA: Diagnosis not present

## 2016-06-01 DIAGNOSIS — M25511 Pain in right shoulder: Secondary | ICD-10-CM | POA: Diagnosis not present

## 2016-06-01 DIAGNOSIS — M25612 Stiffness of left shoulder, not elsewhere classified: Secondary | ICD-10-CM | POA: Diagnosis not present

## 2016-06-01 DIAGNOSIS — M25512 Pain in left shoulder: Secondary | ICD-10-CM | POA: Diagnosis not present

## 2016-06-03 DIAGNOSIS — M25512 Pain in left shoulder: Secondary | ICD-10-CM | POA: Diagnosis not present

## 2016-06-03 DIAGNOSIS — M25511 Pain in right shoulder: Secondary | ICD-10-CM | POA: Diagnosis not present

## 2016-06-03 DIAGNOSIS — M25612 Stiffness of left shoulder, not elsewhere classified: Secondary | ICD-10-CM | POA: Diagnosis not present

## 2016-06-03 DIAGNOSIS — M25611 Stiffness of right shoulder, not elsewhere classified: Secondary | ICD-10-CM | POA: Diagnosis not present

## 2016-06-07 DIAGNOSIS — M25512 Pain in left shoulder: Secondary | ICD-10-CM | POA: Diagnosis not present

## 2016-06-07 DIAGNOSIS — M25611 Stiffness of right shoulder, not elsewhere classified: Secondary | ICD-10-CM | POA: Diagnosis not present

## 2016-06-07 DIAGNOSIS — M25511 Pain in right shoulder: Secondary | ICD-10-CM | POA: Diagnosis not present

## 2016-06-07 DIAGNOSIS — M25612 Stiffness of left shoulder, not elsewhere classified: Secondary | ICD-10-CM | POA: Diagnosis not present

## 2016-06-09 DIAGNOSIS — M25611 Stiffness of right shoulder, not elsewhere classified: Secondary | ICD-10-CM | POA: Diagnosis not present

## 2016-06-09 DIAGNOSIS — M25612 Stiffness of left shoulder, not elsewhere classified: Secondary | ICD-10-CM | POA: Diagnosis not present

## 2016-06-09 DIAGNOSIS — M25512 Pain in left shoulder: Secondary | ICD-10-CM | POA: Diagnosis not present

## 2016-06-09 DIAGNOSIS — M25511 Pain in right shoulder: Secondary | ICD-10-CM | POA: Diagnosis not present

## 2016-06-14 DIAGNOSIS — M25511 Pain in right shoulder: Secondary | ICD-10-CM | POA: Diagnosis not present

## 2016-06-14 DIAGNOSIS — M25611 Stiffness of right shoulder, not elsewhere classified: Secondary | ICD-10-CM | POA: Diagnosis not present

## 2016-06-14 DIAGNOSIS — M25612 Stiffness of left shoulder, not elsewhere classified: Secondary | ICD-10-CM | POA: Diagnosis not present

## 2016-06-14 DIAGNOSIS — M25512 Pain in left shoulder: Secondary | ICD-10-CM | POA: Diagnosis not present

## 2016-06-15 ENCOUNTER — Ambulatory Visit (INDEPENDENT_AMBULATORY_CARE_PROVIDER_SITE_OTHER): Payer: Medicare Other

## 2016-06-15 DIAGNOSIS — E291 Testicular hypofunction: Secondary | ICD-10-CM | POA: Diagnosis not present

## 2016-06-15 MED ORDER — TESTOSTERONE CYPIONATE 200 MG/ML IM SOLN
200.0000 mg | Freq: Once | INTRAMUSCULAR | Status: AC
  Administered 2016-06-15: 200 mg via INTRAMUSCULAR

## 2016-06-15 NOTE — Progress Notes (Signed)
Testosterone IM Injection  Due to Hypogonadism patient is present today for a Testosterone Injection.  Medication: Testosterone Cypionate Dose: 44mL Location: right upper outer buttocks Lot: [id number] Exp:10/2016  Patient tolerated well, no complications were noted  Preformed by: Toniann Fail, LPN   Follow up: pt ran out of medication today. Pt made lab appt in 2 weeks. Pt also made office visit today.

## 2016-06-16 DIAGNOSIS — M25611 Stiffness of right shoulder, not elsewhere classified: Secondary | ICD-10-CM | POA: Diagnosis not present

## 2016-06-16 DIAGNOSIS — M25511 Pain in right shoulder: Secondary | ICD-10-CM | POA: Diagnosis not present

## 2016-06-16 DIAGNOSIS — M25512 Pain in left shoulder: Secondary | ICD-10-CM | POA: Diagnosis not present

## 2016-06-16 DIAGNOSIS — M25612 Stiffness of left shoulder, not elsewhere classified: Secondary | ICD-10-CM | POA: Diagnosis not present

## 2016-06-21 DIAGNOSIS — M25512 Pain in left shoulder: Secondary | ICD-10-CM | POA: Diagnosis not present

## 2016-06-21 DIAGNOSIS — M25612 Stiffness of left shoulder, not elsewhere classified: Secondary | ICD-10-CM | POA: Diagnosis not present

## 2016-06-21 DIAGNOSIS — M25511 Pain in right shoulder: Secondary | ICD-10-CM | POA: Diagnosis not present

## 2016-06-21 DIAGNOSIS — M25611 Stiffness of right shoulder, not elsewhere classified: Secondary | ICD-10-CM | POA: Diagnosis not present

## 2016-06-21 DIAGNOSIS — M7752 Other enthesopathy of left foot: Secondary | ICD-10-CM | POA: Diagnosis not present

## 2016-06-23 DIAGNOSIS — M25511 Pain in right shoulder: Secondary | ICD-10-CM | POA: Diagnosis not present

## 2016-06-23 DIAGNOSIS — M25612 Stiffness of left shoulder, not elsewhere classified: Secondary | ICD-10-CM | POA: Diagnosis not present

## 2016-06-23 DIAGNOSIS — M25611 Stiffness of right shoulder, not elsewhere classified: Secondary | ICD-10-CM | POA: Diagnosis not present

## 2016-06-23 DIAGNOSIS — M25512 Pain in left shoulder: Secondary | ICD-10-CM | POA: Diagnosis not present

## 2016-06-28 ENCOUNTER — Other Ambulatory Visit: Payer: Self-pay

## 2016-06-28 DIAGNOSIS — N401 Enlarged prostate with lower urinary tract symptoms: Secondary | ICD-10-CM

## 2016-06-28 DIAGNOSIS — E785 Hyperlipidemia, unspecified: Secondary | ICD-10-CM

## 2016-06-28 DIAGNOSIS — M25511 Pain in right shoulder: Secondary | ICD-10-CM | POA: Diagnosis not present

## 2016-06-28 DIAGNOSIS — E291 Testicular hypofunction: Secondary | ICD-10-CM

## 2016-06-28 DIAGNOSIS — M25611 Stiffness of right shoulder, not elsewhere classified: Secondary | ICD-10-CM | POA: Diagnosis not present

## 2016-06-28 DIAGNOSIS — M25512 Pain in left shoulder: Secondary | ICD-10-CM | POA: Diagnosis not present

## 2016-06-28 DIAGNOSIS — M25612 Stiffness of left shoulder, not elsewhere classified: Secondary | ICD-10-CM | POA: Diagnosis not present

## 2016-06-29 ENCOUNTER — Other Ambulatory Visit: Payer: Medicare Other

## 2016-06-29 ENCOUNTER — Other Ambulatory Visit: Payer: Self-pay | Admitting: Urology

## 2016-06-29 DIAGNOSIS — N401 Enlarged prostate with lower urinary tract symptoms: Secondary | ICD-10-CM | POA: Diagnosis not present

## 2016-06-29 DIAGNOSIS — E291 Testicular hypofunction: Secondary | ICD-10-CM | POA: Diagnosis not present

## 2016-06-29 DIAGNOSIS — E785 Hyperlipidemia, unspecified: Secondary | ICD-10-CM | POA: Diagnosis not present

## 2016-06-30 DIAGNOSIS — M25512 Pain in left shoulder: Secondary | ICD-10-CM | POA: Diagnosis not present

## 2016-06-30 DIAGNOSIS — M25611 Stiffness of right shoulder, not elsewhere classified: Secondary | ICD-10-CM | POA: Diagnosis not present

## 2016-06-30 DIAGNOSIS — M25511 Pain in right shoulder: Secondary | ICD-10-CM | POA: Diagnosis not present

## 2016-06-30 DIAGNOSIS — M25612 Stiffness of left shoulder, not elsewhere classified: Secondary | ICD-10-CM | POA: Diagnosis not present

## 2016-06-30 LAB — TESTOSTERONE: Testosterone: 686 ng/dL (ref 264–916)

## 2016-06-30 LAB — HEPATIC FUNCTION PANEL
ALK PHOS: 77 IU/L (ref 39–117)
ALT: 21 IU/L (ref 0–44)
AST: 19 IU/L (ref 0–40)
Albumin: 3.9 g/dL (ref 3.5–4.7)
BILIRUBIN, DIRECT: 0.41 mg/dL — AB (ref 0.00–0.40)
Bilirubin Total: 1.5 mg/dL — ABNORMAL HIGH (ref 0.0–1.2)
TOTAL PROTEIN: 6.3 g/dL (ref 6.0–8.5)

## 2016-06-30 LAB — LIPID PANEL
CHOL/HDL RATIO: 2.1 ratio (ref 0.0–5.0)
Cholesterol, Total: 122 mg/dL (ref 100–199)
HDL: 58 mg/dL (ref >39)
LDL Calculated: 50 mg/dL (ref 0–99)
Triglycerides: 70 mg/dL (ref 0–149)
VLDL Cholesterol Cal: 14 mg/dL (ref 5–40)

## 2016-06-30 LAB — ESTRADIOL: ESTRADIOL: 22 pg/mL (ref 7.6–42.6)

## 2016-06-30 LAB — HEMATOCRIT: Hematocrit: 35.8 % — ABNORMAL LOW (ref 37.5–51.0)

## 2016-06-30 LAB — PSA: PROSTATE SPECIFIC AG, SERUM: 0.5 ng/mL (ref 0.0–4.0)

## 2016-06-30 LAB — FSH/LH
FSH: 20.5 m[IU]/mL — ABNORMAL HIGH (ref 1.5–12.4)
LH: 9.5 m[IU]/mL — ABNORMAL HIGH (ref 1.7–8.6)

## 2016-07-02 NOTE — Progress Notes (Signed)
11:31 AM   CHANZ NEISWONGER 06-04-1931 [id number]  Referring provider: Birdie Sons, MD [address] [place], [place] [address]  Chief Complaint  Patient presents with  . Hypogonadism    last seen 08/2015 (office Visit)    HPI: 83 yo WM with hypogonadism, ED and BPH with LU TS who presents today for a 6 month follow up.   Hypogonadism Patient is experiencing a decrease in libido, a decrease in strength, a decreased enjoyment in life and a recent deterioration in his ability to play sports.   This is indicated by his responses to the ADAM questionnaire.  He is currently managing his hypogonadism with testosterone cypionate 1 mL every three weeks.  Patient's most recent testosterone was 686 ng/dL on 06/29/2016.  LFT's are stable and HCT is decreasing.       Androgen Deficiency in the Aging Male    [place] Name 07/04/16 1100         Androgen Deficiency in the Aging Male   Do you have a decrease in libido (sex drive) Yes     Do you have lack of energy Yes     Do you have a decrease in strength and/or endurance Yes     Have you lost height No     Have you noticed a decreased "enjoyment of life" No     Are you sad and/or grumpy No     Are your erections less strong No     Have you noticed a recent deterioration in your ability to play sports Yes     Are you falling asleep after dinner No     Has there been a recent deterioration in your work performance No        Erectile dysfunction His SHIM score is 7, which is severe erectile dysfunction.   His previous SHIM score was 13.  He has been having difficulty with erections for several years.   His major complaint is he does not have a sexual partner.  His libido is diminished.   His risk factors for ED are age, LUTS, HLD, CAD and HTN.  He denies any painful erections or curvatures with his erections.   He has tried PDE5-inhibitors in the past.        [place] Name 07/04/16 1119         SHIM: Over the last 6  months:   How do you rate your confidence that you could get and keep an erection? Moderate     When you had erections with sexual stimulation, how often were your erections hard enough for penetration (entering your partner)? Almost Never or Never     During sexual intercourse, how often were you able to maintain your erection after you had penetrated (entered) your partner? Extremely Difficult     During sexual intercourse, how difficult was it to maintain your erection to completion of intercourse? Extremely Difficult     When you attempted sexual intercourse, how often was it satisfactory for you? Extremely Difficult       SHIM Total Score   SHIM 7        Score: 1-7 Severe ED 8-11 Moderate ED 12-16 Mild-Moderate ED 17-21 Mild ED 22-25 No ED   BPH WITH LUTS His IPSS score today is 4, which is mild lower urinary tract symptomatology. He is mostly satisfied with his quality life due to his urinary symptoms.  His previous I PSS score was 8/0.  He denies any dysuria, hematuria or suprapubic pain.  He also denies any recent fevers, chills, nausea or vomiting.  He does not have a family history of PCa.      IPSS    [place] Name 07/04/16 1100         International Prostate Symptom Score   How often have you had the sensation of not emptying your bladder? Not at All     How often have you had to urinate less than every two hours? Less than 1 in 5 times     How often have you found you stopped and started again several times when you urinated? Less than 1 in 5 times     How often have you found it difficult to postpone urination? Not at All     How often have you had a weak urinary stream? Not at All     How often have you had to strain to start urination? Not at All     How many times did you typically get up at night to urinate? 2 Times     Total IPSS Score 4       Quality of Life due to urinary symptoms   If you were to spend the rest of your life with your urinary condition just  the way it is now how would you feel about that? Mostly Satisfied        Score:  1-7 Mild 8-19 Moderate 20-35 Severe   PMH: Past Medical History:  Diagnosis Date  . BPH (benign prostatic hyperplasia)   . CAD (coronary artery disease)   . CAD (coronary artery disease)   . Carpal tunnel syndrome   . DDD (degenerative disc disease), cervical   . HLD (hyperlipidemia)   . Hypertension   . Hypogonadism in male   . Impotence   . Nocturia   . Weight loss     Surgical History: Past Surgical History:  Procedure Laterality Date  . APPENDECTOMY    . BILATERAL CARPAL TUNNEL RELEASE     05/12/2011, 06/09/2011  . CARDIAC CATHETERIZATION  2004  . CORONARY ANGIOPLASTY    . CORONARY ARTERY BYPASS GRAFT  2007   5; [facility]  . CORONARY STENT PLACEMENT  2005  . ESOPHAGOGASTRODUODENOSCOPY (EGD) WITH PROPOFOL N/A 10/28/2015   Procedure: ESOPHAGOGASTRODUODENOSCOPY (EGD) WITH PROPOFOL;  Surgeon: Manya Silvas, MD;  Location: [facility] ENDOSCOPY;  Service: Endoscopy;  Laterality: N/A;  . TONSILLECTOMY    . UPPER GI ENDOSCOPY  08/02/2010   Dr. Tedra Coupe, gastritis. H Pylori negative    Home Medications:  Allergies as of 07/04/2016      Reactions   Celebrex [celecoxib]    Noxius taste in mouth      Medication List       Accurate as of 07/04/16 11:31 AM. Always use your most recent med list.          atorvastatin 80 MG tablet Commonly known as:  LIPITOR TAKE 1 TABLET DAILY   clopidogrel 75 MG tablet Commonly known as:  PLAVIX TAKE 1 TABLET BY MOUTH DAILY   enalapril 5 MG tablet Commonly known as:  VASOTEC 1 TABLET, ORAL, DAILY   fluticasone 50 MCG/ACT nasal spray Commonly known as:  FLONASE Place 2 sprays into both nostrils daily.   guaiFENesin-codeine 100-10 MG/5ML syrup Take 5-10 mLs by mouth every 6 (six) hours as needed for cough.   HYDROcodone-acetaminophen 7.5-325 MG tablet Commonly known as:  NORCO 1 tablet every 4-6 hours as needed  hydrocortisone 2.5 % cream Apply  topically 2 (two) times daily as needed.   latanoprost 0.005 % ophthalmic solution Commonly known as:  XALATAN   loratadine 10 MG tablet Commonly known as:  CLARITIN Take 10 mg by mouth daily.   metoprolol tartrate 25 MG tablet Commonly known as:  LOPRESSOR Take 1 tablet (25 mg total) by mouth 2 (two) times daily.   nitroGLYCERIN 0.4 MG SL tablet Commonly known as:  NITROSTAT Place 1 tablet (0.4 mg total) under the tongue every 5 (five) minutes as needed for chest pain.   ranitidine 300 MG tablet Commonly known as:  ZANTAC TAKE 1 TABLET TWICE DAILY   testosterone cypionate 200 MG/ML injection Commonly known as:  DEPOTESTOSTERONE CYPIONATE Inject 1 mL (200 mg total) into the muscle every 28 (twenty-eight) days.   zolpidem 10 MG tablet Commonly known as:  AMBIEN TAKE 1/2 TABLET  EVERY NIGHT AT BEDTIME       Allergies:  Allergies  Allergen Reactions  . Celebrex [Celecoxib]     Noxius taste in mouth    Family History: Family History  Problem Relation Age of Onset  . Cirrhosis Father     of liver  . Kidney disease Neg Hx   . Prostate cancer Neg Hx   . Kidney cancer Neg Hx   . Bladder Cancer Neg Hx     Social History:  reports that he quit smoking about 39 years ago. He has a 10.00 pack-year smoking history. He has never used smokeless tobacco. He reports that he drinks alcohol. He reports that he does not use drugs.  ROS: UROLOGY Frequent Urination?: No Hard to postpone urination?: No Burning/pain with urination?: No Get up at night to urinate?: No Leakage of urine?: No Urine stream starts and stops?: No Trouble starting stream?: No Do you have to strain to urinate?: No Blood in urine?: No Urinary tract infection?: No Sexually transmitted disease?: No Injury to kidneys or bladder?: No Painful intercourse?: No Weak stream?: No Erection problems?: No Penile pain?: No  Gastrointestinal Nausea?: No Vomiting?: No Indigestion/heartburn?: No Diarrhea?:  No Constipation?: No  Constitutional Fever: No Night sweats?: No Weight loss?: No Fatigue?: No  Skin Skin rash/lesions?: No Itching?: No  Eyes Blurred vision?: No Double vision?: No  Ears/Nose/Throat Sore throat?: No Sinus problems?: Yes  Hematologic/Lymphatic Swollen glands?: No Easy bruising?: No  Cardiovascular Leg swelling?: No Chest pain?: No  Respiratory Cough?: No Shortness of breath?: Yes  Endocrine Excessive thirst?: No  Musculoskeletal Back pain?: Yes Joint pain?: No  Neurological Headaches?: No Dizziness?: No  Psychologic Depression?: No Anxiety?: No  Physical Exam: BP (!) 124/53   Pulse (!) 56   Ht 5\' 10"  (1.778 m)   Wt 155 lb 11.2 oz (70.6 kg)   BMI 22.34 kg/m   Constitutional: Well nourished. Alert and oriented, No acute distress. HEENT: [place] AT, moist mucus membranes. Trachea midline, no masses. Cardiovascular: No clubbing, cyanosis, or edema. Respiratory: Normal respiratory effort, no increased work of breathing. GI: Abdomen is soft, non tender, non distended, no abdominal masses. Liver and spleen not palpable.  No hernias appreciated.  Stool sample for occult testing is not indicated.   GU: No CVA tenderness.  No bladder fullness or masses.  Patient with circumcised phallus.  Urethral meatus is patent.  No penile discharge. No penile lesions or rashes. Scrotum without lesions, cysts, rashes and/or edema.  Testicles are located scrotally bilaterally. They are atrophic.  No masses are appreciated in the testicles. Left and right  epididymis are normal. Rectal: Patient with  normal sphincter tone. Anus and perineum without scarring or rashes. No rectal masses are appreciated. Prostate is approximately 50 grams, no nodules are appreciated. Seminal vesicles are normal. Skin: No rashes, bruises or suspicious lesions. Lymph: No cervical or inguinal adenopathy. Neurologic: Grossly intact, no focal deficits, moving all 4  extremities. Psychiatric: Normal mood and affect.   Laboratory Data:  Lab Results  Component Value Date   CREATININE 1.30 (H) 05/12/2016    PSA history  0.3 ng/mL on 11/29/2013  0.5 ng/mL on 03/09/2015  0.5 ng/mL on 06/29/2016  Lab Results  Component Value Date   TESTOSTERONE 686 06/29/2016     Assessment & Plan:    1. Hypogonadism:     -most recent testosterone level is 686 ng/dL on 06/29/2016  -continue  continue his testosterone cypionate 200 mg/mL, 1 cc every 3 weeks.  -RTC in 3 months for HCT and testosterone  -RTC in 6 months for HCT, testosterone, PSA, LFT's, ADAM and exam  2. BPH with LUTS  - IPSS score is 4/2, it is improving  - Continue conservative management, avoiding bladder irritants and timed voiding's  - RTC in 6 months for IPSS, PSA, PVR and exam, as testosterone therapy can cause prostate enlargement and worsen LUTS  3. Erectile dysfunction:     -SHIM score is 7  -patient is not currently sexually active  -RTC in 6 months for SHIM score and exam, as testosterone therapy can affect erections  4. Decreasing HCT  - follow up with PCP  Return in about 3 months (around 10/04/2016) for HCT and testosterone one week after injection.  Zara Council, [facility] [address] [place], [place] [address] [phone]

## 2016-07-04 ENCOUNTER — Other Ambulatory Visit: Payer: Self-pay | Admitting: Family Medicine

## 2016-07-04 ENCOUNTER — Other Ambulatory Visit: Payer: Self-pay | Admitting: Urology

## 2016-07-04 ENCOUNTER — Ambulatory Visit (INDEPENDENT_AMBULATORY_CARE_PROVIDER_SITE_OTHER): Payer: Medicare Other | Admitting: Urology

## 2016-07-04 ENCOUNTER — Encounter: Payer: Self-pay | Admitting: Urology

## 2016-07-04 VITALS — BP 124/53 | HR 56 | Ht 70.0 in | Wt 155.7 lb

## 2016-07-04 DIAGNOSIS — I2510 Atherosclerotic heart disease of native coronary artery without angina pectoris: Secondary | ICD-10-CM

## 2016-07-04 DIAGNOSIS — E291 Testicular hypofunction: Secondary | ICD-10-CM

## 2016-07-04 DIAGNOSIS — N138 Other obstructive and reflux uropathy: Secondary | ICD-10-CM | POA: Diagnosis not present

## 2016-07-04 DIAGNOSIS — R710 Precipitous drop in hematocrit: Secondary | ICD-10-CM | POA: Diagnosis not present

## 2016-07-04 DIAGNOSIS — N401 Enlarged prostate with lower urinary tract symptoms: Secondary | ICD-10-CM | POA: Diagnosis not present

## 2016-07-04 DIAGNOSIS — N529 Male erectile dysfunction, unspecified: Secondary | ICD-10-CM | POA: Diagnosis not present

## 2016-07-04 DIAGNOSIS — R053 Chronic cough: Secondary | ICD-10-CM

## 2016-07-04 DIAGNOSIS — R05 Cough: Secondary | ICD-10-CM

## 2016-07-04 MED ORDER — TESTOSTERONE CYPIONATE 200 MG/ML IM SOLN: 200.0000 mg | INTRAMUSCULAR | 0 refills | Status: DC

## 2016-07-04 MED ORDER — ZOLPIDEM TARTRATE 10 MG PO TABS: ORAL_TABLET | ORAL | 3 refills | Status: DC

## 2016-07-04 NOTE — Telephone Encounter (Signed)
Pt is taking 1 tablet instead of 1/2 tablet. Please advise. Thanks.

## 2016-07-04 NOTE — Telephone Encounter (Signed)
Called in.

## 2016-07-04 NOTE — Telephone Encounter (Signed)
Pt contacted office for refill request on the following medications:  zolpidem (AMBIEN) 10 MG tablet.  Ivan Salazar.  CB#[phone]/MW  Pt states this should be changed to 1 tablet per night/MW

## 2016-07-05 DIAGNOSIS — F4323 Adjustment disorder with mixed anxiety and depressed mood: Secondary | ICD-10-CM | POA: Diagnosis not present

## 2016-07-06 DIAGNOSIS — M25611 Stiffness of right shoulder, not elsewhere classified: Secondary | ICD-10-CM | POA: Diagnosis not present

## 2016-07-06 DIAGNOSIS — M25511 Pain in right shoulder: Secondary | ICD-10-CM | POA: Diagnosis not present

## 2016-07-08 DIAGNOSIS — M25611 Stiffness of right shoulder, not elsewhere classified: Secondary | ICD-10-CM | POA: Diagnosis not present

## 2016-07-08 DIAGNOSIS — M25511 Pain in right shoulder: Secondary | ICD-10-CM | POA: Diagnosis not present

## 2016-07-12 DIAGNOSIS — M25512 Pain in left shoulder: Secondary | ICD-10-CM | POA: Diagnosis not present

## 2016-07-12 DIAGNOSIS — M25612 Stiffness of left shoulder, not elsewhere classified: Secondary | ICD-10-CM | POA: Diagnosis not present

## 2016-07-12 DIAGNOSIS — M25611 Stiffness of right shoulder, not elsewhere classified: Secondary | ICD-10-CM | POA: Diagnosis not present

## 2016-07-12 DIAGNOSIS — M25511 Pain in right shoulder: Secondary | ICD-10-CM | POA: Diagnosis not present

## 2016-07-14 DIAGNOSIS — M25511 Pain in right shoulder: Secondary | ICD-10-CM | POA: Diagnosis not present

## 2016-07-14 DIAGNOSIS — M25611 Stiffness of right shoulder, not elsewhere classified: Secondary | ICD-10-CM | POA: Diagnosis not present

## 2016-07-18 DIAGNOSIS — M533 Sacrococcygeal disorders, not elsewhere classified: Secondary | ICD-10-CM | POA: Diagnosis not present

## 2016-07-20 ENCOUNTER — Ambulatory Visit: Payer: Medicare Other

## 2016-07-21 ENCOUNTER — Ambulatory Visit (INDEPENDENT_AMBULATORY_CARE_PROVIDER_SITE_OTHER): Payer: Medicare Other

## 2016-07-21 ENCOUNTER — Other Ambulatory Visit: Payer: Self-pay | Admitting: Family Medicine

## 2016-07-21 DIAGNOSIS — E291 Testicular hypofunction: Secondary | ICD-10-CM

## 2016-07-21 DIAGNOSIS — M25511 Pain in right shoulder: Secondary | ICD-10-CM | POA: Diagnosis not present

## 2016-07-21 DIAGNOSIS — M25611 Stiffness of right shoulder, not elsewhere classified: Secondary | ICD-10-CM | POA: Diagnosis not present

## 2016-07-21 DIAGNOSIS — M25612 Stiffness of left shoulder, not elsewhere classified: Secondary | ICD-10-CM | POA: Diagnosis not present

## 2016-07-21 DIAGNOSIS — M25512 Pain in left shoulder: Secondary | ICD-10-CM | POA: Diagnosis not present

## 2016-07-21 MED ORDER — TESTOSTERONE CYPIONATE 200 MG/ML IM SOLN
200.0000 mg | Freq: Once | INTRAMUSCULAR | Status: AC
  Administered 2016-07-21: 200 mg via INTRAMUSCULAR

## 2016-07-21 NOTE — Progress Notes (Signed)
Testosterone IM Injection  Due to Hypogonadism patient is present today for a Testosterone Injection.  Medication: Testosterone Cypionate Dose: 30mL Location: right upper outer buttocks Lot: [id number] Exp:10/2017  Patient tolerated well, no complications were noted  Preformed by: Toniann Fail, LPN   Follow up: 1 month

## 2016-07-26 DIAGNOSIS — M25511 Pain in right shoulder: Secondary | ICD-10-CM | POA: Diagnosis not present

## 2016-07-26 DIAGNOSIS — M25611 Stiffness of right shoulder, not elsewhere classified: Secondary | ICD-10-CM | POA: Diagnosis not present

## 2016-07-26 DIAGNOSIS — M25512 Pain in left shoulder: Secondary | ICD-10-CM | POA: Diagnosis not present

## 2016-07-26 DIAGNOSIS — M25612 Stiffness of left shoulder, not elsewhere classified: Secondary | ICD-10-CM | POA: Diagnosis not present

## 2016-07-27 DIAGNOSIS — H903 Sensorineural hearing loss, bilateral: Secondary | ICD-10-CM | POA: Diagnosis not present

## 2016-07-27 DIAGNOSIS — H6123 Impacted cerumen, bilateral: Secondary | ICD-10-CM | POA: Diagnosis not present

## 2016-08-01 DIAGNOSIS — M25611 Stiffness of right shoulder, not elsewhere classified: Secondary | ICD-10-CM | POA: Diagnosis not present

## 2016-08-01 DIAGNOSIS — M25511 Pain in right shoulder: Secondary | ICD-10-CM | POA: Diagnosis not present

## 2016-08-01 DIAGNOSIS — H903 Sensorineural hearing loss, bilateral: Secondary | ICD-10-CM | POA: Diagnosis not present

## 2016-08-02 DIAGNOSIS — F4323 Adjustment disorder with mixed anxiety and depressed mood: Secondary | ICD-10-CM | POA: Diagnosis not present

## 2016-08-05 DIAGNOSIS — M25612 Stiffness of left shoulder, not elsewhere classified: Secondary | ICD-10-CM | POA: Diagnosis not present

## 2016-08-05 DIAGNOSIS — M25611 Stiffness of right shoulder, not elsewhere classified: Secondary | ICD-10-CM | POA: Diagnosis not present

## 2016-08-05 DIAGNOSIS — M25511 Pain in right shoulder: Secondary | ICD-10-CM | POA: Diagnosis not present

## 2016-08-05 DIAGNOSIS — M25512 Pain in left shoulder: Secondary | ICD-10-CM | POA: Diagnosis not present

## 2016-08-09 DIAGNOSIS — M25611 Stiffness of right shoulder, not elsewhere classified: Secondary | ICD-10-CM | POA: Diagnosis not present

## 2016-08-09 DIAGNOSIS — M25512 Pain in left shoulder: Secondary | ICD-10-CM | POA: Diagnosis not present

## 2016-08-09 DIAGNOSIS — M25511 Pain in right shoulder: Secondary | ICD-10-CM | POA: Diagnosis not present

## 2016-08-09 DIAGNOSIS — M25612 Stiffness of left shoulder, not elsewhere classified: Secondary | ICD-10-CM | POA: Diagnosis not present

## 2016-08-17 DIAGNOSIS — H40003 Preglaucoma, unspecified, bilateral: Secondary | ICD-10-CM | POA: Diagnosis not present

## 2016-08-23 DIAGNOSIS — M25511 Pain in right shoulder: Secondary | ICD-10-CM | POA: Diagnosis not present

## 2016-08-23 DIAGNOSIS — M25611 Stiffness of right shoulder, not elsewhere classified: Secondary | ICD-10-CM | POA: Diagnosis not present

## 2016-08-23 DIAGNOSIS — M25612 Stiffness of left shoulder, not elsewhere classified: Secondary | ICD-10-CM | POA: Diagnosis not present

## 2016-08-23 DIAGNOSIS — M25512 Pain in left shoulder: Secondary | ICD-10-CM | POA: Diagnosis not present

## 2016-08-24 ENCOUNTER — Ambulatory Visit (INDEPENDENT_AMBULATORY_CARE_PROVIDER_SITE_OTHER): Payer: Medicare Other

## 2016-08-24 DIAGNOSIS — E291 Testicular hypofunction: Secondary | ICD-10-CM | POA: Diagnosis not present

## 2016-08-24 MED ORDER — TESTOSTERONE CYPIONATE 200 MG/ML IM SOLN
200.0000 mg | Freq: Once | INTRAMUSCULAR | Status: AC
  Administered 2016-08-24: 200 mg via INTRAMUSCULAR

## 2016-08-24 NOTE — Progress Notes (Signed)
Testosterone IM Injection  Due to Hypogonadism patient is present today for a Testosterone Injection.  Medication: Testosterone Cypionate Dose: 86mL Location: left upper outer buttocks Lot: [id number] Exp:10/2017  Patient tolerated well, no complications were noted  Preformed by: Toniann Fail, LPN   Follow up: 1 month

## 2016-08-25 DIAGNOSIS — M25512 Pain in left shoulder: Secondary | ICD-10-CM | POA: Diagnosis not present

## 2016-08-25 DIAGNOSIS — M25511 Pain in right shoulder: Secondary | ICD-10-CM | POA: Diagnosis not present

## 2016-08-25 DIAGNOSIS — M25612 Stiffness of left shoulder, not elsewhere classified: Secondary | ICD-10-CM | POA: Diagnosis not present

## 2016-08-25 DIAGNOSIS — M25611 Stiffness of right shoulder, not elsewhere classified: Secondary | ICD-10-CM | POA: Diagnosis not present

## 2016-08-26 ENCOUNTER — Other Ambulatory Visit: Payer: Self-pay | Admitting: Family Medicine

## 2016-08-26 DIAGNOSIS — M5137 Other intervertebral disc degeneration, lumbosacral region: Secondary | ICD-10-CM

## 2016-08-26 MED ORDER — HYDROCODONE-ACETAMINOPHEN 7.5-325 MG PO TABS: ORAL_TABLET | ORAL | 0 refills | Status: DC

## 2016-08-29 DIAGNOSIS — M25611 Stiffness of right shoulder, not elsewhere classified: Secondary | ICD-10-CM | POA: Diagnosis not present

## 2016-08-29 DIAGNOSIS — M25512 Pain in left shoulder: Secondary | ICD-10-CM | POA: Diagnosis not present

## 2016-08-29 DIAGNOSIS — M25612 Stiffness of left shoulder, not elsewhere classified: Secondary | ICD-10-CM | POA: Diagnosis not present

## 2016-08-29 DIAGNOSIS — M25511 Pain in right shoulder: Secondary | ICD-10-CM | POA: Diagnosis not present

## 2016-08-30 ENCOUNTER — Encounter: Payer: Self-pay | Admitting: Family Medicine

## 2016-08-30 ENCOUNTER — Ambulatory Visit
Admission: RE | Admit: 2016-08-30 | Discharge: 2016-08-30 | Disposition: A | Discharge status: Home / Self Care | Payer: Medicare Other | Source: Ambulatory Visit | Attending: Family Medicine | Admitting: Family Medicine

## 2016-08-30 ENCOUNTER — Ambulatory Visit (INDEPENDENT_AMBULATORY_CARE_PROVIDER_SITE_OTHER): Payer: Medicare Other | Admitting: Family Medicine

## 2016-08-30 VITALS — BP 94/50 | HR 62 | Temp 98.5°F | Resp 18 | Wt 157.0 lb

## 2016-08-30 DIAGNOSIS — I2510 Atherosclerotic heart disease of native coronary artery without angina pectoris: Secondary | ICD-10-CM

## 2016-08-30 DIAGNOSIS — R0602 Shortness of breath: Secondary | ICD-10-CM

## 2016-08-30 DIAGNOSIS — I700 Atherosclerosis of aorta: Secondary | ICD-10-CM | POA: Diagnosis not present

## 2016-08-30 NOTE — Patient Instructions (Signed)
Go to the [facility] on [address] for chest Xray  

## 2016-08-30 NOTE — Progress Notes (Signed)
Patient: Ivan Salazar Male    DOB: Apr 03, 1932   83 y.o.   MRN: [id number] Visit Date: 08/30/2016  Today's Provider: Lelon Huh, MD   Chief Complaint  Patient presents with  . Shortness of Breath    x 1 day   Subjective:    Shortness of Breath  This is a new problem. The current episode started yesterday. The problem occurs intermittently. Associated symptoms include leg swelling (in ankles), PND, sputum production and wheezing. Pertinent negatives include no abdominal pain, chest pain, fever, headaches, rhinorrhea, sore throat or vomiting. The symptoms are aggravated by lying flat.  States he has had been having trouble with cough for several weeks related to esophageal dysfunctions. Was coughing more last night and started getting sore in chest. Didn't get much sleep last night. When lying down to rest today has felt more short of breath. No change in breathing hen sitting or with exertion. Soreness in chest is not exertional. Has a little bit of swelling in legs, but no more than usual.      Allergies  Allergen Reactions  . Celebrex [Celecoxib]     Noxius taste in mouth     Current Outpatient Prescriptions:  .  atorvastatin (LIPITOR) 80 MG tablet, TAKE 1 TABLET DAILY, Disp: 90 tablet, Rfl: 4 .  clopidogrel (PLAVIX) 75 MG tablet, TAKE 1 TABLET BY MOUTH DAILY, Disp: 90 tablet, Rfl: 4 .  enalapril (VASOTEC) 5 MG tablet, 1 TABLET, ORAL, DAILY, Disp: 90 tablet, Rfl: 4 .  fluticasone (FLONASE) 50 MCG/ACT nasal spray, Place 2 sprays into both nostrils daily., Disp: 16 g, Rfl: 6 .  HYDROcodone-acetaminophen (NORCO) 7.5-325 MG tablet, 1 tablet every 4-6 hours as needed, Disp: 180 tablet, Rfl: 0 .  hydrocortisone 2.5 % cream, Apply topically 2 (two) times daily as needed., Disp: 30 g, Rfl: 1 .  latanoprost (XALATAN) 0.005 % ophthalmic solution, , Disp: , Rfl:  .  loratadine (CLARITIN) 10 MG tablet, Take 10 mg by mouth daily., Disp: , Rfl:  .  metoprolol tartrate (LOPRESSOR) 25  MG tablet, Take 1 tablet (25 mg total) by mouth 2 (two) times daily., Disp: 180 tablet, Rfl: 4 .  nitroGLYCERIN (NITROSTAT) 0.4 MG SL tablet, Place 1 tablet (0.4 mg total) under the tongue every 5 (five) minutes as needed for chest pain., Disp: 20 tablet, Rfl: 1 .  ranitidine (ZANTAC) 300 MG tablet, TAKE 1 TABLET TWICE DAILY, Disp: 180 tablet, Rfl: 4 .  testosterone cypionate (DEPOTESTOSTERONE CYPIONATE) 200 MG/ML injection, Inject 1 mL (200 mg total) into the muscle every 28 (twenty-eight) days., Disp: 10 mL, Rfl: 0 .  VIRTUSSIN A/C 100-10 MG/5ML syrup, TAKE ONE OR TWO TEASPOONFULS (5-10ML) EVERY SIX HOURS AS NEEDED FOR COUGH, Disp: 180 mL, Rfl: 5 .  zolpidem (AMBIEN) 10 MG tablet, TAKE 1/2 - 1 TABLET  EVERY NIGHT AT BEDTIME, Disp: 30 tablet, Rfl: 3  Review of Systems  Constitutional: Negative for appetite change, chills and fever.  HENT: Positive for congestion (in chest) and postnasal drip. Negative for rhinorrhea, sinus pain and sore throat.   Respiratory: Positive for cough (productive with yellow phlegm), sputum production, chest tightness, shortness of breath and wheezing.   Cardiovascular: Positive for leg swelling (in ankles) and PND. Negative for chest pain and palpitations.  Gastrointestinal: Negative for abdominal pain, nausea and vomiting.  Neurological: Negative for numbness and headaches.    Social History  Substance Use Topics  . Smoking status: Former Smoker    Packs/day:  0.50    Years: 20.00    Quit date: 05/01/1977  . Smokeless tobacco: Never Used  . Alcohol use Yes     Comment: moderate; 2 drinks daily   Objective:   BP (!) 90/48 (BP Location: Left Arm, Patient Position: Sitting, Cuff Size: Normal)   Pulse 62   Temp 98.5 F (36.9 C) (Oral)   Resp 18   Wt 157 lb (71.2 kg)   SpO2 95% Comment: room air  BMI 22.53 kg/m  Vitals:   08/30/16 1500 08/30/16 1510  BP: (!) 90/48 (!) 94/50  Pulse: 62   Resp: 18   Temp: 98.5 F (36.9 C)   TempSrc: Oral   SpO2: 95%     Weight: 157 lb (71.2 kg)      Physical Exam   General Appearance:    Alert, cooperative, no distress  Eyes:    PERRL, conjunctiva/corneas clear, EOM's intact       Lungs:     Clear to auscultation bilaterally, respirations unlabored  Heart:    Regular rate and rhythm  Neurologic:   Awake, alert, oriented x 3. No apparent focal neurological           defect.           Assessment & Plan:     1. Shortness of breath Subjective. No sign of cardiac source. He does have some esophageal dysfunction and is at risk for aspiration. Check Xr.  - EKG 12-Lead - DG Chest 2 View; Future  The entirety of the information documented in the History of Present Illness, Review of Systems and Physical Exam were personally obtained by me. Portions of this information were initially documented by Meyer Cory, CMA and reviewed by me for thoroughness and accuracy.        Lelon Huh, MD  [facility]

## 2016-09-01 DIAGNOSIS — M25512 Pain in left shoulder: Secondary | ICD-10-CM | POA: Diagnosis not present

## 2016-09-01 DIAGNOSIS — M25611 Stiffness of right shoulder, not elsewhere classified: Secondary | ICD-10-CM | POA: Diagnosis not present

## 2016-09-01 DIAGNOSIS — M25511 Pain in right shoulder: Secondary | ICD-10-CM | POA: Diagnosis not present

## 2016-09-01 DIAGNOSIS — M25612 Stiffness of left shoulder, not elsewhere classified: Secondary | ICD-10-CM | POA: Diagnosis not present

## 2016-09-06 DIAGNOSIS — M25611 Stiffness of right shoulder, not elsewhere classified: Secondary | ICD-10-CM | POA: Diagnosis not present

## 2016-09-06 DIAGNOSIS — M25512 Pain in left shoulder: Secondary | ICD-10-CM | POA: Diagnosis not present

## 2016-09-06 DIAGNOSIS — M25612 Stiffness of left shoulder, not elsewhere classified: Secondary | ICD-10-CM | POA: Diagnosis not present

## 2016-09-06 DIAGNOSIS — M25511 Pain in right shoulder: Secondary | ICD-10-CM | POA: Diagnosis not present

## 2016-09-08 ENCOUNTER — Other Ambulatory Visit: Payer: Self-pay | Admitting: Family Medicine

## 2016-09-08 DIAGNOSIS — M25512 Pain in left shoulder: Secondary | ICD-10-CM | POA: Diagnosis not present

## 2016-09-08 DIAGNOSIS — M25611 Stiffness of right shoulder, not elsewhere classified: Secondary | ICD-10-CM | POA: Diagnosis not present

## 2016-09-08 DIAGNOSIS — M25612 Stiffness of left shoulder, not elsewhere classified: Secondary | ICD-10-CM | POA: Diagnosis not present

## 2016-09-08 DIAGNOSIS — M25511 Pain in right shoulder: Secondary | ICD-10-CM | POA: Diagnosis not present

## 2016-09-09 DIAGNOSIS — M533 Sacrococcygeal disorders, not elsewhere classified: Secondary | ICD-10-CM | POA: Diagnosis not present

## 2016-09-13 DIAGNOSIS — F4323 Adjustment disorder with mixed anxiety and depressed mood: Secondary | ICD-10-CM | POA: Diagnosis not present

## 2016-09-14 ENCOUNTER — Ambulatory Visit (INDEPENDENT_AMBULATORY_CARE_PROVIDER_SITE_OTHER): Payer: Medicare Other | Admitting: Family Medicine

## 2016-09-14 ENCOUNTER — Encounter: Payer: Self-pay | Admitting: Family Medicine

## 2016-09-14 VITALS — BP 126/60 | HR 53 | Temp 98.7°F | Resp 16 | Wt 153.0 lb

## 2016-09-14 DIAGNOSIS — M25511 Pain in right shoulder: Secondary | ICD-10-CM | POA: Diagnosis not present

## 2016-09-14 DIAGNOSIS — I2510 Atherosclerotic heart disease of native coronary artery without angina pectoris: Secondary | ICD-10-CM

## 2016-09-14 DIAGNOSIS — S51012A Laceration without foreign body of left elbow, initial encounter: Secondary | ICD-10-CM | POA: Diagnosis not present

## 2016-09-14 DIAGNOSIS — M25512 Pain in left shoulder: Secondary | ICD-10-CM | POA: Diagnosis not present

## 2016-09-14 DIAGNOSIS — M25611 Stiffness of right shoulder, not elsewhere classified: Secondary | ICD-10-CM | POA: Diagnosis not present

## 2016-09-14 DIAGNOSIS — M25612 Stiffness of left shoulder, not elsewhere classified: Secondary | ICD-10-CM | POA: Diagnosis not present

## 2016-09-14 NOTE — Patient Instructions (Signed)
Skin Tear Care A skin tear is a wound in which the top layers of skin have peeled off the deeper skin or tissues underneath them. This is a common problem as people get older because the skin becomes thinner and more fragile. In addition, some medicines, such as oral corticosteroids, can lead to skin thinning if they are taken for long periods of time. A skin tear is often repaired with tape or skin adhesive strips. Depending on the location of the wound, a bandage (dressing) may be applied over the tape or skin adhesive strips. How is this treated? Wound Care  Clean the wound as told by your health care provider. You may be instructed to keep the wound dry for the first few days. If you are told to clean the wound:  Wash the wound with mild soap and water or a salt-water (saline) solution.  Rinse the wound with water to remove all soap.  Do not rub the wound dry. Let the wound air dry.  Change any dressings as told by your health care provider. This includes changing the dressing if it gets wet, gets dirty, or starts to smell bad.  Do not scratch or pick at the wound.  Protect the injured area until it has healed.  Check your wound every day for signs of infection. Check for:  More redness, swelling, or pain.  More fluid or blood.  Warmth.  Pus or a bad smell. Medicines   Take over-the-counter and prescription medicines only as told by your health care provider.  If you were prescribed an antibiotic medicine, take or apply it as told by your health care provider. Do not stop using the antibiotic even if your condition improves. General Instructions  Keep the dressing dry as told by your health care provider.  Do not take baths, swim, or do anything that puts your wound underwater until your health care provider approves.  Keep all follow-up visits as told by your health care provider. This is important. Contact a health care provider if:  You have more redness, swelling,  or pain around your wound.  You have more fluid or blood coming from your wound.  Your wound feels warm to the touch.  You have pus or a bad smell coming from your wound. Get help right away if:  You have a red streak that goes away from the skin tear.  You have a fever and chills and your symptoms suddenly get worse. This information is not intended to replace advice given to you by your health care provider. Make sure you discuss any questions you have with your health care provider. Document Released: 01/11/2001 Document Revised: 12/13/2015 Document Reviewed: 03/09/2015 Elsevier Interactive Patient Education  2017 [employer]  

## 2016-09-14 NOTE — Progress Notes (Signed)
Patient: Ivan Salazar Male    DOB: 1931/06/26   83 y.o.   MRN: [id number] Visit Date: 09/14/2016  Today's Provider: Lelon Huh, MD   Chief Complaint  Patient presents with  . Arm Injury   Subjective:    Arm Injury   Incident onset: 3-4 days ago. The incident occurred at home. The injury mechanism was a direct blow (hit arm against door latch). Pain location: left lower arm. Pertinent negatives include no chest pain. Treatments tried: Neosporin  The treatment provided mild relief.  Patient states the wound is not healing and has been draining yellow bloody fluid.      Allergies  Allergen Reactions  . Celebrex [Celecoxib]     Noxius taste in mouth     Current Outpatient Prescriptions:  .  atorvastatin (LIPITOR) 80 MG tablet, TAKE 1 TABLET DAILY, Disp: 90 tablet, Rfl: 4 .  clopidogrel (PLAVIX) 75 MG tablet, TAKE 1 TABLET BY MOUTH DAILY, Disp: 90 tablet, Rfl: 4 .  enalapril (VASOTEC) 5 MG tablet, 1 TABLET, ORAL, DAILY, Disp: 90 tablet, Rfl: 4 .  fluticasone (FLONASE) 50 MCG/ACT nasal spray, Place 2 sprays into both nostrils daily., Disp: 16 g, Rfl: 6 .  HYDROcodone-acetaminophen (NORCO) 7.5-325 MG tablet, 1 tablet every 4-6 hours as needed, Disp: 180 tablet, Rfl: 0 .  hydrocortisone 2.5 % cream, APPLY TO AFFECTED AREA(S) TWO TIMES A DAY AS NEEDED, Disp: 30 g, Rfl: 5 .  latanoprost (XALATAN) 0.005 % ophthalmic solution, , Disp: , Rfl:  .  loratadine (CLARITIN) 10 MG tablet, Take 10 mg by mouth daily., Disp: , Rfl:  .  metoprolol tartrate (LOPRESSOR) 25 MG tablet, Take 1 tablet (25 mg total) by mouth 2 (two) times daily., Disp: 180 tablet, Rfl: 4 .  nitroGLYCERIN (NITROSTAT) 0.4 MG SL tablet, Place 1 tablet (0.4 mg total) under the tongue every 5 (five) minutes as needed for chest pain., Disp: 20 tablet, Rfl: 1 .  ranitidine (ZANTAC) 300 MG tablet, TAKE 1 TABLET TWICE DAILY, Disp: 180 tablet, Rfl: 4 .  testosterone cypionate (DEPOTESTOSTERONE CYPIONATE) 200 MG/ML injection,  Inject 1 mL (200 mg total) into the muscle every 28 (twenty-eight) days., Disp: 10 mL, Rfl: 0 .  VIRTUSSIN A/C 100-10 MG/5ML syrup, TAKE ONE OR TWO TEASPOONFULS (5-10ML) EVERY SIX HOURS AS NEEDED FOR COUGH, Disp: 180 mL, Rfl: 5 .  zolpidem (AMBIEN) 10 MG tablet, TAKE 1/2 - 1 TABLET  EVERY NIGHT AT BEDTIME, Disp: 30 tablet, Rfl: 3  Review of Systems  Constitutional: Negative for appetite change, chills and fever.  Respiratory: Negative for chest tightness, shortness of breath and wheezing.   Cardiovascular: Negative for chest pain and palpitations.  Gastrointestinal: Negative for abdominal pain, nausea and vomiting.  Skin: Positive for color change and wound.  Hematological: Bruises/bleeds easily.    Social History  Substance Use Topics  . Smoking status: Former Smoker    Packs/day: 0.50    Years: 20.00    Quit date: 05/01/1977  . Smokeless tobacco: Never Used  . Alcohol use Yes     Comment: moderate; 2 drinks daily   Objective:   BP 126/60 (BP Location: Left Arm, Patient Position: Sitting, Cuff Size: Normal)   Pulse (!) 53   Temp 98.7 F (37.1 C) (Oral)   Resp 16   Wt 153 lb (69.4 kg)   SpO2 96% Comment: room air  BMI 21.95 kg/m  There were no vitals filed for this visit.   Physical Exam  About  75mmx15mm skin tear dorsum of left forearm. Scant drainage. Non erythema.     Assessment & Plan:     1. Skin tear of left elbow without complication, initial encounter Healing appropriately. Continue daily neosporin dressing changes. Counseled on s/s of infection.        Lelon Huh, MD  [facility]

## 2016-09-15 ENCOUNTER — Telehealth: Payer: Self-pay | Admitting: Family Medicine

## 2016-09-21 DIAGNOSIS — M25612 Stiffness of left shoulder, not elsewhere classified: Secondary | ICD-10-CM | POA: Diagnosis not present

## 2016-09-21 DIAGNOSIS — M25512 Pain in left shoulder: Secondary | ICD-10-CM | POA: Diagnosis not present

## 2016-09-21 DIAGNOSIS — M25511 Pain in right shoulder: Secondary | ICD-10-CM | POA: Diagnosis not present

## 2016-09-21 DIAGNOSIS — M25611 Stiffness of right shoulder, not elsewhere classified: Secondary | ICD-10-CM | POA: Diagnosis not present

## 2016-09-22 ENCOUNTER — Other Ambulatory Visit: Payer: Self-pay | Admitting: Student

## 2016-09-22 DIAGNOSIS — R05 Cough: Secondary | ICD-10-CM | POA: Diagnosis not present

## 2016-09-22 DIAGNOSIS — R0602 Shortness of breath: Secondary | ICD-10-CM | POA: Diagnosis not present

## 2016-09-22 DIAGNOSIS — K222 Esophageal obstruction: Secondary | ICD-10-CM | POA: Diagnosis not present

## 2016-09-22 DIAGNOSIS — R1310 Dysphagia, unspecified: Secondary | ICD-10-CM

## 2016-09-27 ENCOUNTER — Ambulatory Visit (INDEPENDENT_AMBULATORY_CARE_PROVIDER_SITE_OTHER): Payer: Medicare Other

## 2016-09-27 DIAGNOSIS — E291 Testicular hypofunction: Secondary | ICD-10-CM

## 2016-09-27 MED ORDER — TESTOSTERONE CYPIONATE 200 MG/ML IM SOLN
200.0000 mg | Freq: Once | INTRAMUSCULAR | Status: AC
  Administered 2016-09-27: 200 mg via INTRAMUSCULAR

## 2016-09-27 NOTE — Progress Notes (Signed)
Testosterone IM Injection  Due to Hypogonadism patient is present today for a Testosterone Injection.  Medication: Testosterone Cypionate Dose: 47ml Location: right upper outer buttocks Lot: [id number]  Exp:10/2017  Patient tolerated well, no complications were noted  Preformed by: C.Corinna Capra, CMA  Follow up: 1 month. Printed lab orders. Pt states he will have labs drawn at [facility] outside of office.

## 2016-09-28 ENCOUNTER — Ambulatory Visit: Payer: Medicare Other

## 2016-09-29 DIAGNOSIS — M25611 Stiffness of right shoulder, not elsewhere classified: Secondary | ICD-10-CM | POA: Diagnosis not present

## 2016-09-29 DIAGNOSIS — M25512 Pain in left shoulder: Secondary | ICD-10-CM | POA: Diagnosis not present

## 2016-09-29 DIAGNOSIS — M25612 Stiffness of left shoulder, not elsewhere classified: Secondary | ICD-10-CM | POA: Diagnosis not present

## 2016-09-29 DIAGNOSIS — M25511 Pain in right shoulder: Secondary | ICD-10-CM | POA: Diagnosis not present

## 2016-09-30 ENCOUNTER — Ambulatory Visit: Payer: Medicare Other

## 2016-10-11 ENCOUNTER — Encounter: Payer: Self-pay | Admitting: Family Medicine

## 2016-10-11 ENCOUNTER — Ambulatory Visit
Admission: RE | Admit: 2016-10-11 | Discharge: 2016-10-11 | Disposition: A | Discharge status: Home / Self Care | Payer: Medicare Other | Source: Ambulatory Visit | Attending: Family Medicine | Admitting: Family Medicine

## 2016-10-11 ENCOUNTER — Ambulatory Visit (INDEPENDENT_AMBULATORY_CARE_PROVIDER_SITE_OTHER): Payer: Medicare Other | Admitting: Family Medicine

## 2016-10-11 VITALS — BP 130/60 | HR 55 | Temp 98.1°F | Resp 16 | Wt 156.8 lb

## 2016-10-11 DIAGNOSIS — M5030 Other cervical disc degeneration, unspecified cervical region: Secondary | ICD-10-CM | POA: Diagnosis not present

## 2016-10-11 DIAGNOSIS — M542 Cervicalgia: Secondary | ICD-10-CM

## 2016-10-11 DIAGNOSIS — M47812 Spondylosis without myelopathy or radiculopathy, cervical region: Secondary | ICD-10-CM | POA: Diagnosis not present

## 2016-10-11 DIAGNOSIS — I2510 Atherosclerotic heart disease of native coronary artery without angina pectoris: Secondary | ICD-10-CM | POA: Diagnosis not present

## 2016-10-11 NOTE — Progress Notes (Signed)
Patient: Ivan Salazar Male    DOB: Feb 04, 1932   83 y.o.   MRN: [id number] Visit Date: 10/11/2016  Today's Provider: Lelon Huh, MD   Chief Complaint  Patient presents with  . Neck Pain   Subjective:    Patient has had pain in posterior neck for 2 days. Patient states pain is dull and aching. Patient states when he bends and moves his neck, he hears a grinding and popping sound. Patient also has neck stiffness. No headaches and no radiation. Patient has been taking his prescribed hydrocodone for the pain.    Neck Pain   This is a new problem. The current episode started in the past 7 days (2 days). The problem occurs intermittently. The problem has been unchanged. The pain is associated with nothing. The pain is present in the occipital region. The quality of the pain is described as aching. The pain is at a severity of 3/10. The pain is mild. The symptoms are aggravated by twisting, position and bending. The pain is same all the time. Stiffness is present all day. Pertinent negatives include no chest pain, fever, headaches, leg pain, numbness, pain with swallowing, paresis, photophobia, syncope, tingling, trouble swallowing, visual change, weakness or weight loss. Treatments tried: prescibed hydrocodone. The treatment provided moderate relief.       Allergies  Allergen Reactions  . Celebrex [Celecoxib]     Noxius taste in mouth     Current Outpatient Prescriptions:  .  atorvastatin (LIPITOR) 80 MG tablet, TAKE 1 TABLET DAILY, Disp: 90 tablet, Rfl: 4 .  clopidogrel (PLAVIX) 75 MG tablet, TAKE 1 TABLET BY MOUTH DAILY, Disp: 90 tablet, Rfl: 4 .  enalapril (VASOTEC) 5 MG tablet, 1 TABLET, ORAL, DAILY, Disp: 90 tablet, Rfl: 4 .  fluticasone (FLONASE) 50 MCG/ACT nasal spray, Place 2 sprays into both nostrils daily., Disp: 16 g, Rfl: 6 .  HYDROcodone-acetaminophen (NORCO) 7.5-325 MG tablet, 1 tablet every 4-6 hours as needed, Disp: 180 tablet, Rfl: 0 .  hydrocortisone 2.5 %  cream, APPLY TO AFFECTED AREA(S) TWO TIMES A DAY AS NEEDED, Disp: 30 g, Rfl: 5 .  latanoprost (XALATAN) 0.005 % ophthalmic solution, , Disp: , Rfl:  .  loratadine (CLARITIN) 10 MG tablet, Take 10 mg by mouth daily., Disp: , Rfl:  .  metoprolol tartrate (LOPRESSOR) 25 MG tablet, Take 1 tablet (25 mg total) by mouth 2 (two) times daily., Disp: 180 tablet, Rfl: 4 .  nitroGLYCERIN (NITROSTAT) 0.4 MG SL tablet, Place 1 tablet (0.4 mg total) under the tongue every 5 (five) minutes as needed for chest pain., Disp: 20 tablet, Rfl: 1 .  ranitidine (ZANTAC) 300 MG tablet, TAKE 1 TABLET TWICE DAILY, Disp: 180 tablet, Rfl: 4 .  testosterone cypionate (DEPOTESTOSTERONE CYPIONATE) 200 MG/ML injection, Inject 1 mL (200 mg total) into the muscle every 28 (twenty-eight) days., Disp: 10 mL, Rfl: 0 .  VIRTUSSIN A/C 100-10 MG/5ML syrup, TAKE ONE OR TWO TEASPOONFULS (5-10ML) EVERY SIX HOURS AS NEEDED FOR COUGH, Disp: 180 mL, Rfl: 5 .  zolpidem (AMBIEN) 10 MG tablet, TAKE 1/2 - 1 TABLET  EVERY NIGHT AT BEDTIME, Disp: 30 tablet, Rfl: 3  Review of Systems  Constitutional: Negative for appetite change, chills, fever and weight loss.  HENT: Negative for trouble swallowing.   Eyes: Negative for photophobia.  Respiratory: Negative for chest tightness, shortness of breath and wheezing.   Cardiovascular: Negative for chest pain, palpitations and syncope.  Gastrointestinal: Negative for abdominal pain, nausea and  vomiting.  Musculoskeletal: Positive for neck pain and neck stiffness.  Neurological: Negative for tingling, weakness, numbness and headaches.    Social History  Substance Use Topics  . Smoking status: Former Smoker    Packs/day: 0.50    Years: 20.00    Quit date: 05/01/1977  . Smokeless tobacco: Never Used  . Alcohol use Yes     Comment: moderate; 2 drinks daily   Objective:   BP 130/60 (BP Location: Right Arm, Patient Position: Sitting, Cuff Size: Normal)   Pulse (!) 55   Temp 98.1 F (36.7 C) (Oral)    Resp 16   Wt 156 lb 12.8 oz (71.1 kg)   SpO2 97%   BMI 22.50 kg/m  Vitals:   10/11/16 1556  BP: 130/60  Pulse: (!) 55  Resp: 16  Temp: 98.1 F (36.7 C)  TempSrc: Oral  SpO2: 97%  Weight: 156 lb 12.8 oz (71.1 kg)     Physical Exam  General appearance: alert, well developed, well nourished, cooperative and in no distress Head: Normocephalic, without obvious abnormality, atraumatic Respiratory: Respirations even and unlabored, normal respiratory rate MS: Slight tenderness of mid cervical spine. FROM of head, neck and shoulders with no pain     Assessment & Plan:     1. Neck pain  - DG Cervical Spine Complete; Future       Lelon Huh, MD  [facility]

## 2016-10-11 NOTE — Patient Instructions (Addendum)
   Go to the [facility] on [address] for neck Xray  

## 2016-10-12 ENCOUNTER — Telehealth: Payer: Self-pay

## 2016-10-12 NOTE — Telephone Encounter (Signed)
Patient had called office to inquire about results of x- ray taken yesterday. KW

## 2016-10-13 ENCOUNTER — Ambulatory Visit: Payer: Medicare Other

## 2016-10-13 NOTE — Telephone Encounter (Signed)
Patient was notified.

## 2016-10-14 ENCOUNTER — Emergency Department: Payer: Medicare Other

## 2016-10-14 ENCOUNTER — Emergency Department
Admission: EM | Admit: 2016-10-14 | Discharge: 2016-10-14 | Disposition: A | Discharge status: Home / Self Care | Payer: Medicare Other | Attending: Emergency Medicine | Admitting: Emergency Medicine

## 2016-10-14 ENCOUNTER — Encounter: Payer: Self-pay | Admitting: Emergency Medicine

## 2016-10-14 DIAGNOSIS — I10 Essential (primary) hypertension: Secondary | ICD-10-CM | POA: Insufficient documentation

## 2016-10-14 DIAGNOSIS — S0990XA Unspecified injury of head, initial encounter: Secondary | ICD-10-CM | POA: Insufficient documentation

## 2016-10-14 DIAGNOSIS — Y929 Unspecified place or not applicable: Secondary | ICD-10-CM | POA: Insufficient documentation

## 2016-10-14 DIAGNOSIS — M40202 Unspecified kyphosis, cervical region: Secondary | ICD-10-CM | POA: Diagnosis not present

## 2016-10-14 DIAGNOSIS — M25511 Pain in right shoulder: Secondary | ICD-10-CM | POA: Diagnosis not present

## 2016-10-14 DIAGNOSIS — Z951 Presence of aortocoronary bypass graft: Secondary | ICD-10-CM | POA: Insufficient documentation

## 2016-10-14 DIAGNOSIS — M7989 Other specified soft tissue disorders: Secondary | ICD-10-CM | POA: Diagnosis not present

## 2016-10-14 DIAGNOSIS — Z7902 Long term (current) use of antithrombotics/antiplatelets: Secondary | ICD-10-CM | POA: Diagnosis not present

## 2016-10-14 DIAGNOSIS — S4991XA Unspecified injury of right shoulder and upper arm, initial encounter: Secondary | ICD-10-CM | POA: Diagnosis not present

## 2016-10-14 DIAGNOSIS — Y939 Activity, unspecified: Secondary | ICD-10-CM | POA: Insufficient documentation

## 2016-10-14 DIAGNOSIS — W010XXA Fall on same level from slipping, tripping and stumbling without subsequent striking against object, initial encounter: Secondary | ICD-10-CM | POA: Diagnosis not present

## 2016-10-14 DIAGNOSIS — S79911A Unspecified injury of right hip, initial encounter: Secondary | ICD-10-CM | POA: Diagnosis not present

## 2016-10-14 DIAGNOSIS — M79651 Pain in right thigh: Secondary | ICD-10-CM | POA: Diagnosis not present

## 2016-10-14 DIAGNOSIS — Z87891 Personal history of nicotine dependence: Secondary | ICD-10-CM | POA: Insufficient documentation

## 2016-10-14 DIAGNOSIS — Z79899 Other long term (current) drug therapy: Secondary | ICD-10-CM | POA: Insufficient documentation

## 2016-10-14 DIAGNOSIS — S40811A Abrasion of right upper arm, initial encounter: Secondary | ICD-10-CM | POA: Diagnosis not present

## 2016-10-14 DIAGNOSIS — M79631 Pain in right forearm: Secondary | ICD-10-CM

## 2016-10-14 DIAGNOSIS — S299XXA Unspecified injury of thorax, initial encounter: Secondary | ICD-10-CM | POA: Diagnosis not present

## 2016-10-14 DIAGNOSIS — R0781 Pleurodynia: Secondary | ICD-10-CM | POA: Diagnosis not present

## 2016-10-14 DIAGNOSIS — I2510 Atherosclerotic heart disease of native coronary artery without angina pectoris: Secondary | ICD-10-CM | POA: Diagnosis not present

## 2016-10-14 DIAGNOSIS — Y999 Unspecified external cause status: Secondary | ICD-10-CM | POA: Insufficient documentation

## 2016-10-14 DIAGNOSIS — Z955 Presence of coronary angioplasty implant and graft: Secondary | ICD-10-CM | POA: Diagnosis not present

## 2016-10-14 DIAGNOSIS — M25551 Pain in right hip: Secondary | ICD-10-CM | POA: Diagnosis not present

## 2016-10-14 DIAGNOSIS — R1011 Right upper quadrant pain: Secondary | ICD-10-CM | POA: Diagnosis not present

## 2016-10-14 DIAGNOSIS — R0789 Other chest pain: Secondary | ICD-10-CM | POA: Diagnosis present

## 2016-10-14 DIAGNOSIS — T07XXXA Unspecified multiple injuries, initial encounter: Secondary | ICD-10-CM

## 2016-10-14 DIAGNOSIS — W19XXXA Unspecified fall, initial encounter: Secondary | ICD-10-CM

## 2016-10-14 MED ORDER — FENTANYL CITRATE (PF) 100 MCG/2ML IJ SOLN
50.0000 ug | Freq: Once | INTRAMUSCULAR | Status: AC
  Administered 2016-10-14: 50 ug via INTRAMUSCULAR
  Filled 2016-10-14: qty 2

## 2016-10-14 MED ORDER — ACETAMINOPHEN 500 MG PO TABS
1000.0000 mg | ORAL_TABLET | Freq: Once | ORAL | Status: AC
  Administered 2016-10-14: 1000 mg via ORAL
  Filled 2016-10-14: qty 2

## 2016-10-14 NOTE — Progress Notes (Signed)
Patient called stating he fell while downtown and scraped himself badly. EMS came out and evaluated him and bandaged him up. Patient stated that the bandages were to tight and he wanted to have them loosened. Patient came in for a nurse visit only and had bandages changed and loosened. Afterwards, patient reported that the dressings felt much better.

## 2016-10-14 NOTE — ED Provider Notes (Signed)
[facility] Emergency Department Provider Note  ____________________________________________  Time seen: Approximately 8:26 PM  I have reviewed the triage vital signs and the nursing notes.   HISTORY  Chief Complaint Fall   HPI ASHTAN GIRTMAN is a 83 y.o. male with a history of coronary artery disease on Plavix who presents for evaluation after mechanical fall. Patient reports that he was going to the bank at noon when he tripped on a curb and fell onto his right side. He reports that his pain has been getting progressively worse which prompted him to come to the emergency room for evaluation. He is complaining of pain on his right lateral chest wall, right shoulder, right thigh. Worst pain is in his chest wall, worse when he takes a deep breath, currently moderate in intensity, constant and nonradiating. He hit his head on the asphalt with no LOC. He denies neck pain, back pain, abdominal pain. Last tetanus shot was within the last 2-3 years.  Past Medical History:  Diagnosis Date  . BPH (benign prostatic hyperplasia)   . CAD (coronary artery disease)   . CAD (coronary artery disease)   . Carpal tunnel syndrome   . DDD (degenerative disc disease), cervical   . HLD (hyperlipidemia)   . Hypertension   . Hypogonadism in male   . Impotence   . Nocturia   . Weight loss     Patient Active Problem List   Diagnosis Date Noted  . Eustachian tube dysfunction 09/03/2015  . Schatzki's ring 09/03/2015  . Adenomatous polyp of colon 09/03/2015  . Hypogonadism in male 03/09/2015  . BPH with obstruction/lower urinary tract symptoms 03/09/2015  . Loss of weight 03/05/2015  . Actinic keratoses 10/22/2014  . DDD (degenerative disc disease), lumbosacral 10/22/2014  . Acid reflux 10/22/2014  . Testicular hypofunction 06/27/2012  . Bursitis of hip 01/18/2011  . Family history of colonic polyps 12/24/2007  . CAD in native artery 08/16/2006  . H/O coronary artery  bypass surgery 08/16/2006  . Anemia, iron deficiency 09/23/2005  . Cardiac enlargement 07/20/2005  . HLD (hyperlipidemia) 05/02/1998  . Insomnia, persistent 05/02/1998  . Essential (primary) hypertension 05/02/1998    Past Surgical History:  Procedure Laterality Date  . APPENDECTOMY    . BILATERAL CARPAL TUNNEL RELEASE     05/12/2011, 06/09/2011  . CARDIAC CATHETERIZATION  2004  . CORONARY ANGIOPLASTY    . CORONARY ARTERY BYPASS GRAFT  2007   5; [facility]  . CORONARY STENT PLACEMENT  2005  . ESOPHAGOGASTRODUODENOSCOPY (EGD) WITH PROPOFOL N/A 10/28/2015   Procedure: ESOPHAGOGASTRODUODENOSCOPY (EGD) WITH PROPOFOL;  Surgeon: Manya Silvas, MD;  Location: [facility] ENDOSCOPY;  Service: Endoscopy;  Laterality: N/A;  . TONSILLECTOMY    . UPPER GI ENDOSCOPY  08/02/2010   Dr. Tedra Coupe, gastritis. H Pylori negative    Prior to Admission medications   Medication Sig Start Date End Date Taking? Authorizing Provider  atorvastatin (LIPITOR) 80 MG tablet TAKE 1 TABLET DAILY 07/21/16   Birdie Sons, MD  clopidogrel (PLAVIX) 75 MG tablet TAKE 1 TABLET BY MOUTH DAILY 03/14/16   Birdie Sons, MD  enalapril (VASOTEC) 5 MG tablet 1 TABLET, ORAL, DAILY 12/07/15   Birdie Sons, MD  fluticasone (FLONASE) 50 MCG/ACT nasal spray Place 2 sprays into both nostrils daily. 11/04/15   Birdie Sons, MD  HYDROcodone-acetaminophen [facility]) 7.5-325 MG tablet 1 tablet every 4-6 hours as needed 08/26/16   Birdie Sons, MD  hydrocortisone 2.5 % cream APPLY TO  AFFECTED AREA(S) TWO TIMES A DAY AS NEEDED 09/08/16   Birdie Sons, MD  latanoprost (XALATAN) 0.005 % ophthalmic solution Place 1 drop into both eyes at bedtime.     [provider]  loratadine (CLARITIN) 10 MG tablet Take 10 mg by mouth daily.    [provider]  metoprolol tartrate (LOPRESSOR) 25 MG tablet Take 1 tablet (25 mg total) by mouth 2 (two) times daily. 11/23/15   Birdie Sons, MD  nitroGLYCERIN (NITROSTAT) 0.4 MG SL  tablet Place 1 tablet (0.4 mg total) under the tongue every 5 (five) minutes as needed for chest pain. 06/05/15   Birdie Sons, MD  ranitidine (ZANTAC) 300 MG tablet TAKE 1 TABLET TWICE DAILY 08/04/15   Birdie Sons, MD  testosterone cypionate (DEPOTESTOSTERONE CYPIONATE) 200 MG/ML injection Inject 1 mL (200 mg total) into the muscle every 28 (twenty-eight) days. 07/04/16   McGowan, Hunt Oris, PA-C  VIRTUSSIN A/C 100-10 MG/5ML syrup TAKE ONE OR TWO TEASPOONFULS (5-10ML) EVERY SIX HOURS AS NEEDED FOR COUGH 07/04/16   Birdie Sons, MD  zolpidem (AMBIEN) 10 MG tablet TAKE 1/2 - 1 TABLET  EVERY NIGHT AT BEDTIME 07/04/16   Birdie Sons, MD    Allergies Celebrex [celecoxib]  Family History  Problem Relation Age of Onset  . Cirrhosis Father        of liver  . Kidney disease Neg Hx   . Prostate cancer Neg Hx   . Kidney cancer Neg Hx   . Bladder Cancer Neg Hx     Social History Social History  Substance Use Topics  . Smoking status: Former Smoker    Packs/day: 0.50    Years: 20.00    Quit date: 05/01/1977  . Smokeless tobacco: Never Used  . Alcohol use Yes     Comment: moderate; 2 drinks daily    Review of Systems  Constitutional: Negative for fever. Eyes: Negative for visual changes. ENT: + facial injury. No neck injury Cardiovascular: + R chest wall injury Respiratory: Negative for shortness of breath.  Gastrointestinal: Negative for abdominal pain or injury. Genitourinary: Negative for dysuria. Musculoskeletal: Negative for back injury. + R shoulder, R thigh, and R forearm pain Skin: + several skin abrasions and tears Neurological: Negative for head injury.   ____________________________________________   PHYSICAL EXAM:  VITAL SIGNS: Vitals:   10/14/16 2036  BP: (!) 169/64  Pulse: (!) 59  Resp: 16  Temp: 98.3 F (36.8 C)   Constitutional: Alert and oriented. No acute distress. Does not appear intoxicated. HEENT Head: Normocephalic and R periorbital  hematoma Face: No facial bony tenderness. Stable midface Ears: No hemotympanum bilaterally. No Battle sign Eyes: No eye injury. PERRL. No raccoon eyes Nose: Nontender. No epistaxis. No rhinorrhea Mouth/Throat: Mucous membranes are moist. No oropharyngeal blood. No dental injury. Airway patent without stridor. Normal voice. Neck: no C-collar in place. No midline c-spine tenderness.  Cardiovascular: Normal rate, regular rhythm. Normal and symmetric distal pulses are present in all extremities. Pulmonary/Chest: Chest wall is stable, ttp over the R lateral chest wall. Normal respiratory effort. Breath sounds are normal. No crepitus.  Abdominal: Soft, nontender, non distended. Musculoskeletal: Nontender with normal full range of motion in all joints. TTP over the anterior R shoulder, R forearm, and R thigh. No deformities. No thoracic or lumbar midline spinal tenderness. Pelvis is stable. Skin: Skin is warm, dry and intact. Several contusions, skin tears, and abrasions in all 4 extremities Psychiatric: Speech and behavior are appropriate. Neurological: Normal  speech and language. Moves all extremities to command. No gross focal neurologic deficits are appreciated.  Glascow Coma Score: 4 - Opens eyes on own 6 - Follows simple motor commands 5 - Alert and oriented GCS: 15  ____________________________________________   LABS (all labs ordered are listed, but only abnormal results are displayed)  Labs Reviewed - No data to display ____________________________________________  EKG  none  ____________________________________________  RADIOLOGY  CT head and neck: negative  XR R shoulder: negative  XR R forearm: negative  XR R thigh: negative   ____________________________________________   PROCEDURES  Procedure(s) performed: None Procedures Critical Care performed:  None ____________________________________________   INITIAL IMPRESSION / ASSESSMENT AND PLAN / ED  COURSE  83 y.o. male with a history of coronary artery disease on Plavix who presents for evaluation after mechanical fall complaining of right chest wall, right shoulder, right forearm, and right thigh pain plus several skin tears and abrasions. Also with obvious right periorbital hematoma. GCS of 15. Tetanus shot up-to-date. Wound care provided with Xeroform and Kerlix. We'll perform CT head, cervical spine, and x-rays. Will give Tylenol and fentanyl for pain.  Clinical Course as of Oct 14 2201  Fri Oct 14, 2016  2154 Imaging with no acute findings. Patient's pain is much improved and patient able to ambulate with no difficulty. Patient take hydrocodone at home for chronic pain. I offered patient a walker since patient lives alone so he can use for the next few days until he is totally back to his baseline. Patient accepted the walker. Patient will be discharged home on Tylenol 500 mg 3 times a day + his chronic norco. Return precautions given to patient and written and verbally for delayed complications from his fall.  [CV]    Clinical Course User Index [CV] Rudene Re, MD    Pertinent labs & imaging results that were available during my care of the patient were reviewed by me and considered in my medical decision making (see chart for details).    ____________________________________________   FINAL CLINICAL IMPRESSION(S) / ED DIAGNOSES  Final diagnoses:  Fall  Injury of head, initial encounter  Abrasions of multiple sites  Acute pain of right shoulder  Right thigh pain  Right forearm pain      NEW MEDICATIONS STARTED DURING THIS VISIT:  New Prescriptions   No medications on file     Note:  This document was prepared using Dragon voice recognition software and may include unintentional dictation errors.    Rudene Re, MD 10/14/16 2204

## 2016-10-14 NOTE — ED Notes (Signed)
Pt's neighbor called and on the way to pick up pt. Pt notified

## 2016-10-14 NOTE — Discharge Instructions (Signed)
You were seen in the emergency department after a fall. Luckily all of your imaging studies did not show any evidence of injuries. Follow-up with you doctor within the next 2-3 days for further evaluation. Sometimes injuries can present at a later time and therefore it is imperative that you return to the emergency room if you have a severe headache, facial droop, neck pain, numbness or weakness of your extremities, slurred speech, difficulty finding words, chest pain, back pain, abdominal pain, or any other new symptoms that were not present during this visit. You may take Tylenol at home for your pain.   For pain take 500 mg of Tylenol 3 times a day plus one Norco as needed every 6 hours.

## 2016-10-14 NOTE — ED Triage Notes (Signed)
Pt comes into the ED via EMS from home c/o a fall that occurred earlier today while he was at the bank.  Fall was mechanical over the curb.  Patient denies hitting head, LOC, chest pain, or shortness of breath.  Patient presents with multiple abrasions to right arm, right leg, and presents with bruising to the right eye. All bleeding under control at this time.  Patient c/o right rib pain, right leg pain, and right shoulder pain.  Patient takes plavix.  PAtient in NAD at this time.

## 2016-10-14 NOTE — ED Notes (Signed)
Patient transported to CT 

## 2016-10-17 ENCOUNTER — Telehealth: Payer: Self-pay | Admitting: Family Medicine

## 2016-10-17 NOTE — Telephone Encounter (Signed)
Pt was discharged from [facility] 10/14/16 for a fall.  I have scheduled a hospital follow up appointment/MW

## 2016-10-18 ENCOUNTER — Encounter: Payer: Self-pay | Admitting: Family Medicine

## 2016-10-18 ENCOUNTER — Ambulatory Visit (INDEPENDENT_AMBULATORY_CARE_PROVIDER_SITE_OTHER): Payer: Medicare Other | Admitting: Family Medicine

## 2016-10-18 VITALS — BP 110/54 | HR 52 | Temp 98.6°F | Resp 16 | Wt 152.0 lb

## 2016-10-18 DIAGNOSIS — I2510 Atherosclerotic heart disease of native coronary artery without angina pectoris: Secondary | ICD-10-CM | POA: Diagnosis not present

## 2016-10-18 DIAGNOSIS — T148XXA Other injury of unspecified body region, initial encounter: Secondary | ICD-10-CM | POA: Diagnosis not present

## 2016-10-18 NOTE — Patient Instructions (Signed)
Abrasion An abrasion is a cut or scrape on the surface of your skin. An abrasion does not go through all of the layers of your skin. It is important to take good care of your abrasion to prevent infection. Follow these instructions at home: Medicines  Take or apply medicines only as told by your doctor.  If you were prescribed an antibiotic ointment, finish all of it even if you start to feel better. Wound care  Clean the wound with mild soap and water 2-3 times per day or as told by your doctor. Pat your wound dry with a clean towel. Do not rub it.  There are many ways to close and cover a wound. Follow instructions from your doctor about: ? How to take care of your wound. ? When and how you should change your bandage (dressing). ? When and how you should take off your dressing.  Check your wound every day for signs of infection. Watch for: ? Redness, swelling, or pain. ? Fluid, blood, or pus. General instructions  Keep the dressing dry as told by your doctor. Do not take baths, swim, use a hot tub, or do anything that would put your wound underwater until your doctor says it is okay.  If there is swelling, raise (elevate) the injured area above the level of your heart while you are sitting or lying down.  Keep all follow-up visits as told by your doctor. This is important. Contact a doctor if:  You were given a tetanus shot and you have any of these where the needle went in: ? Swelling. ? Very bad pain. ? Redness. ? Bleeding.  Medicine does not help your pain.  You have any of these at the site of the wound: ? More redness. ? More swelling. ? More pain. Get help right away if:  You have a red streak going away from your wound.  You have a fever.  You have fluid, blood, or pus coming from your wound.  There is a bad smell coming from your wound. This information is not intended to replace advice given to you by your health care provider. Make sure you discuss any  questions you have with your health care provider. Document Released: 10/05/2007 Document Revised: 09/24/2015 Document Reviewed: 04/16/2014 Elsevier Interactive Patient Education  2018 Elsevier Inc.  

## 2016-10-18 NOTE — Progress Notes (Signed)
Patient: Ivan Salazar Male    DOB: 03-Aug-1931   83 y.o.   MRN: [id number] Visit Date: 10/18/2016  Today's Provider: Lelon Huh, MD   Chief Complaint  Patient presents with  . Follow-up   Subjective:    HPI  Follow up ER visit  Patient was seen in ER for mechanical fall on 10/14/2016. He states he tripped in parking lot at [employer] down town. Paramedics evaluated and bandaged him, but he eventually decided to go to ER for further evaluation due to worsening pain in his ribs.  Treatment for this included ordering imaging studies including ribs, femur and right forearm and shoulder, CT cervical spine and head which all showed no acute findings. Patient was given Tylenol and Fentanyl for pain. Patient was also given a walker to aid with ambulation. Patient was discharged home and advised to take Tylenol and his chronic norco to help with pain management. He reports good compliance with treatment. He reports this condition is Improved.Patient states his pain is controlled with the prescribed medications.   ------------------------------------------------------------------------------------      Allergies  Allergen Reactions  . Celebrex [Celecoxib]     Noxius taste in mouth     Current Outpatient Prescriptions:  .  atorvastatin (LIPITOR) 80 MG tablet, TAKE 1 TABLET DAILY, Disp: 90 tablet, Rfl: 4 .  clopidogrel (PLAVIX) 75 MG tablet, TAKE 1 TABLET BY MOUTH DAILY, Disp: 90 tablet, Rfl: 4 .  enalapril (VASOTEC) 5 MG tablet, 1 TABLET, ORAL, DAILY, Disp: 90 tablet, Rfl: 4 .  fluticasone (FLONASE) 50 MCG/ACT nasal spray, Place 2 sprays into both nostrils daily., Disp: 16 g, Rfl: 6 .  HYDROcodone-acetaminophen (NORCO) 7.5-325 MG tablet, 1 tablet every 4-6 hours as needed, Disp: 180 tablet, Rfl: 0 .  hydrocortisone 2.5 % cream, APPLY TO AFFECTED AREA(S) TWO TIMES A DAY AS NEEDED, Disp: 30 g, Rfl: 5 .  latanoprost (XALATAN) 0.005 % ophthalmic solution, Place 1 drop into both  eyes at bedtime. , Disp: , Rfl:  .  loratadine (CLARITIN) 10 MG tablet, Take 10 mg by mouth daily., Disp: , Rfl:  .  metoprolol tartrate (LOPRESSOR) 25 MG tablet, Take 1 tablet (25 mg total) by mouth 2 (two) times daily., Disp: 180 tablet, Rfl: 4 .  nitroGLYCERIN (NITROSTAT) 0.4 MG SL tablet, Place 1 tablet (0.4 mg total) under the tongue every 5 (five) minutes as needed for chest pain., Disp: 20 tablet, Rfl: 1 .  ranitidine (ZANTAC) 300 MG tablet, TAKE 1 TABLET TWICE DAILY, Disp: 180 tablet, Rfl: 4 .  testosterone cypionate (DEPOTESTOSTERONE CYPIONATE) 200 MG/ML injection, Inject 1 mL (200 mg total) into the muscle every 28 (twenty-eight) days., Disp: 10 mL, Rfl: 0 .  VIRTUSSIN A/C 100-10 MG/5ML syrup, TAKE ONE OR TWO TEASPOONFULS (5-10ML) EVERY SIX HOURS AS NEEDED FOR COUGH, Disp: 180 mL, Rfl: 5 .  zolpidem (AMBIEN) 10 MG tablet, TAKE 1/2 - 1 TABLET  EVERY NIGHT AT BEDTIME, Disp: 30 tablet, Rfl: 3  Review of Systems  Constitutional: Negative for appetite change, chills and fever.  Respiratory: Negative for chest tightness, shortness of breath and wheezing.   Cardiovascular: Negative for chest pain and palpitations.       Pain in right side of ribs when coughing  Gastrointestinal: Negative for abdominal pain, nausea and vomiting.  Musculoskeletal: Positive for arthralgias (right shoulder pain) and myalgias (right thigh and right arm).    Social History  Substance Use Topics  . Smoking status: Former Smoker  Packs/day: 0.50    Years: 20.00    Quit date: 05/01/1977  . Smokeless tobacco: Never Used  . Alcohol use Yes     Comment: moderate; 2 drinks daily   Objective:   BP (!) 110/54 (BP Location: Left Arm, Patient Position: Sitting, Cuff Size: Normal)   Pulse (!) 52   Temp 98.6 F (37 C) (Oral)   Resp 16   Wt 152 lb (68.9 kg)   SpO2 96% Comment: room air  BMI 21.81 kg/m  There were no vitals filed for this visit.   Physical Exam  Several abrasions of right arm and forearm  and right thigh. Wounds are clean and intact with small amount clear discharge.     Assessment & Plan:     1. Abrasion Up to date on tetanus. No sign of infections. Replaced dressings. Advised to keep dry and to pat dry completely after bathing and re-apply fresh bandages. Counseled on s/s of infection.        Lelon Huh, MD  [facility]

## 2016-10-19 ENCOUNTER — Other Ambulatory Visit: Payer: Self-pay | Admitting: Family Medicine

## 2016-10-19 DIAGNOSIS — R05 Cough: Secondary | ICD-10-CM

## 2016-10-19 DIAGNOSIS — R053 Chronic cough: Secondary | ICD-10-CM

## 2016-10-19 NOTE — Telephone Encounter (Signed)
Pt needs refill on his   VIRTUSSIN A/C 100-10 MG/5ML syrup  Thanks teri

## 2016-10-20 ENCOUNTER — Ambulatory Visit: Payer: Medicare Other

## 2016-10-20 MED ORDER — GUAIFENESIN-CODEINE 100-10 MG/5ML PO SYRP: ORAL_SOLUTION | ORAL | 5 refills | Status: DC

## 2016-10-20 NOTE — Telephone Encounter (Signed)
Please call in Virtussin thanks.

## 2016-10-21 ENCOUNTER — Other Ambulatory Visit: Payer: Self-pay | Admitting: Family Medicine

## 2016-10-25 ENCOUNTER — Other Ambulatory Visit: Payer: Medicare Other

## 2016-10-26 ENCOUNTER — Ambulatory Visit
Admission: RE | Admit: 2016-10-26 | Discharge: 2016-10-26 | Disposition: A | Discharge status: Home / Self Care | Payer: Medicare Other | Source: Ambulatory Visit | Attending: Student | Admitting: Student

## 2016-10-26 DIAGNOSIS — J392 Other diseases of pharynx: Secondary | ICD-10-CM | POA: Diagnosis not present

## 2016-10-26 DIAGNOSIS — K449 Diaphragmatic hernia without obstruction or gangrene: Secondary | ICD-10-CM | POA: Insufficient documentation

## 2016-10-26 DIAGNOSIS — R1310 Dysphagia, unspecified: Secondary | ICD-10-CM

## 2016-10-26 DIAGNOSIS — K222 Esophageal obstruction: Secondary | ICD-10-CM | POA: Diagnosis present

## 2016-10-27 ENCOUNTER — Ambulatory Visit (INDEPENDENT_AMBULATORY_CARE_PROVIDER_SITE_OTHER): Payer: Medicare Other

## 2016-10-27 DIAGNOSIS — E291 Testicular hypofunction: Secondary | ICD-10-CM | POA: Diagnosis not present

## 2016-10-27 MED ORDER — TESTOSTERONE CYPIONATE 200 MG/ML IM SOLN
200.0000 mg | Freq: Once | INTRAMUSCULAR | Status: AC
  Administered 2016-10-27: 200 mg via INTRAMUSCULAR

## 2016-10-27 NOTE — Progress Notes (Signed)
Testosterone IM Injection  Due to Hypogonadism patient is present today for a Testosterone Injection.  Medication: Testosterone Cypionate Dose: 1 ml Location: left upper outer buttocks Lot: [id number] Exp:10/2017  Patient tolerated well, no complications were noted  Performed by: C. Corinna Capra, CMA   Follow up: 1 month

## 2016-10-29 ENCOUNTER — Other Ambulatory Visit: Payer: Self-pay | Admitting: Family Medicine

## 2016-10-29 NOTE — Telephone Encounter (Signed)
Please call in zolpidem  

## 2016-10-29 NOTE — Telephone Encounter (Signed)
Prescription for Zolpidem 10mg  phoned in to [facility].  Thanks  -Greg Eckrich

## 2016-11-01 ENCOUNTER — Encounter: Payer: Self-pay | Admitting: Family Medicine

## 2016-11-01 ENCOUNTER — Other Ambulatory Visit: Payer: Self-pay | Admitting: Family Medicine

## 2016-11-01 ENCOUNTER — Ambulatory Visit (INDEPENDENT_AMBULATORY_CARE_PROVIDER_SITE_OTHER): Payer: Medicare Other | Admitting: Family Medicine

## 2016-11-01 VITALS — BP 108/52 | HR 56 | Temp 98.6°F | Resp 16 | Wt 157.0 lb

## 2016-11-01 DIAGNOSIS — M542 Cervicalgia: Secondary | ICD-10-CM

## 2016-11-01 DIAGNOSIS — J3489 Other specified disorders of nose and nasal sinuses: Secondary | ICD-10-CM

## 2016-11-01 DIAGNOSIS — I2510 Atherosclerotic heart disease of native coronary artery without angina pectoris: Secondary | ICD-10-CM

## 2016-11-01 DIAGNOSIS — M47812 Spondylosis without myelopathy or radiculopathy, cervical region: Secondary | ICD-10-CM | POA: Diagnosis not present

## 2016-11-01 NOTE — Progress Notes (Signed)
Patient: Ivan Salazar Male    DOB: [birth year]   83 y.o.   MRN: [id number] Visit Date: 11/01/2016  Today's Provider: Lelon Huh, MD   Chief Complaint  Patient presents with  . Neck Pain   Subjective:    Neck Pain   This is a recurrent problem. The current episode started 1 to 4 weeks ago. The problem occurs intermittently. The problem has been unchanged. Associated symptoms include leg pain. Pertinent negatives include no chest pain, fever, headaches, numbness or tingling.  Patient was last seen for this problem 3 weeks ago and x rays were ordered. Patient comes in today stating he still feels and hears a clicking sound in his neck. This clicking sound occurs most times when he moves his neck. Sometimes there is pain associated with the clicking.  He had neck CT on 6-15 after a fall with findings as below MPRESSION: No acute intracranial abnormality.  Moderate brain parenchymal atrophy and chronic microvascular disease.  No evidence of traumatic injury to cervical spine.  Multilevel osteoarthritic changes with straightening of cervical lordosis.  Has been applying ice packs a couple times a day which he thinks helps somewhat.       Allergies  Allergen Reactions  . Celebrex [Celecoxib]     Noxius taste in mouth     Current Outpatient Prescriptions:  .  atorvastatin (LIPITOR) 80 MG tablet, TAKE 1 TABLET DAILY, Disp: 90 tablet, Rfl: 4 .  clopidogrel (PLAVIX) 75 MG tablet, TAKE 1 TABLET BY MOUTH DAILY, Disp: 90 tablet, Rfl: 4 .  enalapril (VASOTEC) 5 MG tablet, 1 TABLET, ORAL, DAILY, Disp: 90 tablet, Rfl: 4 .  fluticasone (FLONASE) 50 MCG/ACT nasal spray, PLACE 2 SPRAYS INTO BOTH NOSTRILS DAILY., Disp: 17 g, Rfl: 5 .  guaiFENesin-codeine (VIRTUSSIN A/C) 100-10 MG/5ML syrup, TAKE ONE OR TWO TEASPOONFULS (5-10ML) EVERY SIX HOURS AS NEEDED FOR COUGH, Disp: 180 mL, Rfl: 5 .  HYDROcodone-acetaminophen (NORCO) 7.5-325 MG tablet, 1 tablet every 4-6 hours as needed,  Disp: 180 tablet, Rfl: 0 .  hydrocortisone 2.5 % cream, APPLY TO AFFECTED AREA(S) TWO TIMES A DAY AS NEEDED, Disp: 30 g, Rfl: 5 .  latanoprost (XALATAN) 0.005 % ophthalmic solution, Place 1 drop into both eyes at bedtime. , Disp: , Rfl:  .  loratadine (CLARITIN) 10 MG tablet, Take 10 mg by mouth daily., Disp: , Rfl:  .  metoprolol tartrate (LOPRESSOR) 25 MG tablet, Take 1 tablet (25 mg total) by mouth 2 (two) times daily., Disp: 180 tablet, Rfl: 4 .  nitroGLYCERIN (NITROSTAT) 0.4 MG SL tablet, Place 1 tablet (0.4 mg total) under the tongue every 5 (five) minutes as needed for chest pain., Disp: 20 tablet, Rfl: 1 .  ranitidine (ZANTAC) 300 MG tablet, TAKE 1 TABLET TWICE DAILY, Disp: 180 tablet, Rfl: 3 .  testosterone cypionate (DEPOTESTOSTERONE CYPIONATE) 200 MG/ML injection, Inject 1 mL (200 mg total) into the muscle every 28 (twenty-eight) days., Disp: 10 mL, Rfl: 0 .  zolpidem (AMBIEN) 10 MG tablet, TAKE 0.5-1 TABLETS BY MOUTH EVERY NIGHT AT BEDTIME, Disp: 30 tablet, Rfl: 2  Review of Systems  Constitutional: Negative for appetite change, chills and fever.  Respiratory: Negative for chest tightness, shortness of breath and wheezing.   Cardiovascular: Negative for chest pain and palpitations.  Gastrointestinal: Negative for abdominal pain, nausea and vomiting.  Musculoskeletal: Positive for back pain, myalgias (sore muscles) and neck pain.  Neurological: Negative for tingling, numbness and headaches.    Social History  Substance Use Topics  . Smoking status: Former Smoker    Packs/day: 0.50    Years: 20.00    Quit date: 05/01/1977  . Smokeless tobacco: Never Used  . Alcohol use Yes     Comment: moderate; 2 drinks daily   Objective:   BP (!) 108/52 (BP Location: Right Arm, Patient Position: Sitting, Cuff Size: Normal)   Pulse (!) 56   Temp 98.6 F (37 C) (Oral)   Resp 16   Wt 157 lb (71.2 kg)   SpO2 97% Comment: room air  BMI 22.53 kg/m  There were no vitals filed for this  visit.   Physical Exam  General appearance: alert, well developed, well nourished, cooperative and in no distress Head: Normocephalic, without obvious abnormality, atraumatic Respiratory: Respirations even and unlabored, normal respiratory rate Extremities: No gross deformities Skin: Skin color, texture, turgor normal. No rashes seen  Psych: Appropriate mood and affect. Neurologic: Mental status: Alert, oriented to person, place, and time, thought content appropriate.     Assessment & Plan:     1. Neck pain   2. Osteoarthritis of cervical spine, unspecified spinal osteoarthritis complication status Reviewed results of imaging studies. Counseled that he is not a candidate for NSAIDs, but a scheduled icing routine should be effective and safe. Recommend he ice back of neck for about 10 minutes at least twice a day or as needed.        Lelon Huh, MD  [facility]

## 2016-11-03 ENCOUNTER — Other Ambulatory Visit: Payer: Medicare Other

## 2016-11-03 DIAGNOSIS — E291 Testicular hypofunction: Secondary | ICD-10-CM

## 2016-11-03 DIAGNOSIS — M25551 Pain in right hip: Secondary | ICD-10-CM | POA: Diagnosis not present

## 2016-11-03 DIAGNOSIS — N401 Enlarged prostate with lower urinary tract symptoms: Secondary | ICD-10-CM | POA: Diagnosis not present

## 2016-11-03 DIAGNOSIS — E785 Hyperlipidemia, unspecified: Secondary | ICD-10-CM

## 2016-11-04 ENCOUNTER — Telehealth: Payer: Self-pay

## 2016-11-04 ENCOUNTER — Other Ambulatory Visit: Payer: Self-pay | Admitting: Family Medicine

## 2016-11-04 DIAGNOSIS — M5137 Other intervertebral disc degeneration, lumbosacral region: Secondary | ICD-10-CM

## 2016-11-04 DIAGNOSIS — E291 Testicular hypofunction: Secondary | ICD-10-CM

## 2016-11-04 LAB — HEPATIC FUNCTION PANEL
ALBUMIN: 4 g/dL (ref 3.5–4.7)
ALK PHOS: 70 IU/L (ref 39–117)
ALT: 13 IU/L (ref 0–44)
AST: 20 IU/L (ref 0–40)
BILIRUBIN, DIRECT: 0.33 mg/dL (ref 0.00–0.40)
Bilirubin Total: 1.5 mg/dL — ABNORMAL HIGH (ref 0.0–1.2)
Total Protein: 6.4 g/dL (ref 6.0–8.5)

## 2016-11-04 LAB — PSA: Prostate Specific Ag, Serum: 0.6 ng/mL (ref 0.0–4.0)

## 2016-11-04 LAB — LIPID PANEL
CHOL/HDL RATIO: 2 ratio (ref 0.0–5.0)
CHOLESTEROL TOTAL: 110 mg/dL (ref 100–199)
HDL: 54 mg/dL (ref >39)
LDL CALC: 42 mg/dL (ref 0–99)
Triglycerides: 70 mg/dL (ref 0–149)
VLDL Cholesterol Cal: 14 mg/dL (ref 5–40)

## 2016-11-04 LAB — FSH/LH
FSH: 22.6 m[IU]/mL — ABNORMAL HIGH (ref 1.5–12.4)
LH: 10.4 m[IU]/mL — ABNORMAL HIGH (ref 1.7–8.6)

## 2016-11-04 LAB — HEMATOCRIT: Hematocrit: 35.5 % — ABNORMAL LOW (ref 37.5–51.0)

## 2016-11-04 LAB — TESTOSTERONE: TESTOSTERONE: 1315 ng/dL — AB (ref 264–916)

## 2016-11-04 LAB — ESTRADIOL: Estradiol: 79 pg/mL — ABNORMAL HIGH (ref 7.6–42.6)

## 2016-11-04 MED ORDER — HYDROCODONE-ACETAMINOPHEN 7.5-325 MG PO TABS: ORAL_TABLET | ORAL | 0 refills | Status: DC

## 2016-11-04 NOTE — Telephone Encounter (Signed)
-----   Message from Nori Riis, PA-C sent at 11/04/2016  8:26 AM EDT ----- Please let Mr. Boise know that his testosterone level is too high.  We need to discontinue the injections and retest the testosterone in one month.

## 2016-11-04 NOTE — Telephone Encounter (Signed)
Pt contacted office for refill request on the following medications:  HYDROcodone-acetaminophen (NORCO) 7.5-325 MG tablet.  Pt is requesting 3 Rx.  Starting 12/05/16.  MM#[phone]/[id number]

## 2016-11-04 NOTE — Telephone Encounter (Signed)
Patient notified

## 2016-11-07 NOTE — Telephone Encounter (Signed)
Last message in error.  thanks [employer]

## 2016-11-07 NOTE — Telephone Encounter (Deleted)
Pt would like to go ahead and pick up these rx's although he won't have them filled until the date on each rx.  Pt's call back is [phone]  Thanks teri

## 2016-11-09 NOTE — Addendum Note (Signed)
Addended by: Tommy Rainwater on: 11/09/2016 01:31 PM   Modules accepted: Orders

## 2016-11-10 DIAGNOSIS — M25551 Pain in right hip: Secondary | ICD-10-CM | POA: Diagnosis not present

## 2016-11-21 DIAGNOSIS — M545 Low back pain: Secondary | ICD-10-CM | POA: Diagnosis not present

## 2016-11-21 DIAGNOSIS — M25551 Pain in right hip: Secondary | ICD-10-CM | POA: Diagnosis not present

## 2016-12-01 ENCOUNTER — Other Ambulatory Visit: Payer: Medicare Other

## 2016-12-05 DIAGNOSIS — M461 Sacroiliitis, not elsewhere classified: Secondary | ICD-10-CM | POA: Diagnosis not present

## 2016-12-05 DIAGNOSIS — M25551 Pain in right hip: Secondary | ICD-10-CM | POA: Diagnosis not present

## 2016-12-14 DIAGNOSIS — M545 Low back pain: Secondary | ICD-10-CM | POA: Diagnosis not present

## 2016-12-14 DIAGNOSIS — M5416 Radiculopathy, lumbar region: Secondary | ICD-10-CM | POA: Diagnosis not present

## 2016-12-20 DIAGNOSIS — M5416 Radiculopathy, lumbar region: Secondary | ICD-10-CM | POA: Diagnosis not present

## 2016-12-20 DIAGNOSIS — M545 Low back pain: Secondary | ICD-10-CM | POA: Diagnosis not present

## 2016-12-21 ENCOUNTER — Other Ambulatory Visit: Payer: Medicare Other

## 2016-12-21 DIAGNOSIS — E291 Testicular hypofunction: Secondary | ICD-10-CM

## 2016-12-22 ENCOUNTER — Telehealth: Payer: Self-pay

## 2016-12-22 DIAGNOSIS — R293 Abnormal posture: Secondary | ICD-10-CM | POA: Diagnosis not present

## 2016-12-22 DIAGNOSIS — M545 Low back pain: Secondary | ICD-10-CM | POA: Diagnosis not present

## 2016-12-22 LAB — TESTOSTERONE

## 2016-12-22 NOTE — Telephone Encounter (Signed)
-----   Message from Nori Riis, PA-C sent at 12/22/2016  8:32 AM EDT ----- Please let [name] know that we can restart his testosterone injections.  We will start with 0.5 cc q 3 weeks and retest his testosterone after the 4th injection.

## 2016-12-22 NOTE — Telephone Encounter (Signed)
LMOM- can restart injections. Call to make nurse visit.

## 2016-12-26 ENCOUNTER — Ambulatory Visit: Payer: Medicare Other | Admitting: Family Medicine

## 2016-12-26 ENCOUNTER — Ambulatory Visit: Payer: Medicare Other

## 2016-12-27 DIAGNOSIS — M5416 Radiculopathy, lumbar region: Secondary | ICD-10-CM | POA: Diagnosis not present

## 2016-12-27 DIAGNOSIS — M545 Low back pain: Secondary | ICD-10-CM | POA: Diagnosis not present

## 2016-12-28 ENCOUNTER — Encounter: Payer: Self-pay | Admitting: Family Medicine

## 2016-12-28 ENCOUNTER — Ambulatory Visit (INDEPENDENT_AMBULATORY_CARE_PROVIDER_SITE_OTHER): Payer: Medicare Other | Admitting: Family Medicine

## 2016-12-28 ENCOUNTER — Telehealth: Payer: Self-pay

## 2016-12-28 ENCOUNTER — Ambulatory Visit (INDEPENDENT_AMBULATORY_CARE_PROVIDER_SITE_OTHER): Payer: Medicare Other

## 2016-12-28 VITALS — BP 130/60 | HR 59 | Temp 98.2°F | Resp 16 | Wt 145.0 lb

## 2016-12-28 DIAGNOSIS — E291 Testicular hypofunction: Secondary | ICD-10-CM | POA: Diagnosis not present

## 2016-12-28 DIAGNOSIS — I2510 Atherosclerotic heart disease of native coronary artery without angina pectoris: Secondary | ICD-10-CM

## 2016-12-28 DIAGNOSIS — M5137 Other intervertebral disc degeneration, lumbosacral region: Secondary | ICD-10-CM | POA: Diagnosis not present

## 2016-12-28 MED ORDER — HYDROCODONE-ACETAMINOPHEN 7.5-325 MG PO TABS: 1.0000 | ORAL_TABLET | ORAL | 0 refills | Status: DC | PRN

## 2016-12-28 MED ORDER — TESTOSTERONE CYPIONATE 200 MG/ML IM SOLN
200.0000 mg | Freq: Once | INTRAMUSCULAR | Status: AC
  Administered 2016-12-28: 200 mg via INTRAMUSCULAR

## 2016-12-28 NOTE — Telephone Encounter (Signed)
Pt presented today for his testosterone injection. Pt inquired about why his levels were so high to begin with and now have dropped so low. Reinforced with pt how levels got to the higher levels and they dropped due to not receiving injections. Pt became very confused. Pt inquired about if he needs to continue injections. Reinforced with pt it would be his choice to continue or stop injections. Pt stated "I come to see Larene Beach for that reason. She should be able to tell me if I should continue or stop the injections." Pt requested a note be sent. Please advise.

## 2016-12-28 NOTE — Progress Notes (Signed)
Patient: Ivan Salazar Male    DOB: March 15, 1932   83 y.o.   MRN: [id number] Visit Date: 12/28/2016  Today's Provider: Lelon Huh, MD   Chief Complaint  Patient presents with  . Medication Management   Subjective:    HPI  DDD (degenerative disc disease), lumbosacral From 05/10/2016-At that time pain adequately controlled on current medications. He states he has had more several left sided back pain for the last couple of months. Was seen by Dr. Sabra Heck 12-07-16 and given steroid injection and referred to PT, but has been needed to take an extra hydrocodone/apap overnight due to pain waking him up. He would like to change quantity of hydrocodone/apap prescription to allow 7 tablets a day. He states he has follow up with Dr. Sabra Heck in a few weeks.   xray done by his orthopedist 11/10/2016 revealed severe DDD l5-s1 and sacroiliac arthrosis   Allergies  Allergen Reactions  . Celebrex [Celecoxib]     Noxius taste in mouth     Current Outpatient Prescriptions:  .  atorvastatin (LIPITOR) 80 MG tablet, TAKE 1 TABLET DAILY, Disp: 90 tablet, Rfl: 4 .  clopidogrel (PLAVIX) 75 MG tablet, TAKE 1 TABLET BY MOUTH DAILY, Disp: 90 tablet, Rfl: 4 .  enalapril (VASOTEC) 5 MG tablet, 1 TABLET, ORAL, DAILY, Disp: 90 tablet, Rfl: 4 .  fluticasone (FLONASE) 50 MCG/ACT nasal spray, PLACE 2 SPRAYS INTO BOTH NOSTRILS DAILY., Disp: 17 g, Rfl: 5 .  guaiFENesin-codeine (VIRTUSSIN A/C) 100-10 MG/5ML syrup, TAKE ONE OR TWO TEASPOONFULS (5-10ML) EVERY SIX HOURS AS NEEDED FOR COUGH, Disp: 180 mL, Rfl: 5 .  HYDROcodone-acetaminophen (NORCO) 7.5-325 MG tablet, 1 tablet every 4-6 hours as needed, Disp: 180 tablet, Rfl: 0 .  hydrocortisone 2.5 % cream, APPLY TO AFFECTED AREA(S) TWO TIMES A DAY AS NEEDED, Disp: 30 g, Rfl: 5 .  latanoprost (XALATAN) 0.005 % ophthalmic solution, Place 1 drop into both eyes at bedtime. , Disp: , Rfl:  .  loratadine (CLARITIN) 10 MG tablet, Take 10 mg by mouth daily., Disp: , Rfl:  .   metoprolol tartrate (LOPRESSOR) 25 MG tablet, Take 1 tablet (25 mg total) by mouth 2 (two) times daily., Disp: 180 tablet, Rfl: 4 .  nitroGLYCERIN (NITROSTAT) 0.4 MG SL tablet, Place 1 tablet (0.4 mg total) under the tongue every 5 (five) minutes as needed for chest pain., Disp: 20 tablet, Rfl: 1 .  ranitidine (ZANTAC) 300 MG tablet, TAKE 1 TABLET TWICE DAILY, Disp: 180 tablet, Rfl: 3 .  testosterone cypionate (DEPOTESTOSTERONE CYPIONATE) 200 MG/ML injection, Inject 1 mL (200 mg total) into the muscle every 28 (twenty-eight) days., Disp: 10 mL, Rfl: 0 .  zolpidem (AMBIEN) 10 MG tablet, TAKE 0.5-1 TABLETS BY MOUTH EVERY NIGHT AT BEDTIME, Disp: 30 tablet, Rfl: 2  Review of Systems  Constitutional: Negative for appetite change, chills and fever.  Respiratory: Negative for chest tightness, shortness of breath and wheezing.   Cardiovascular: Negative for chest pain and palpitations.  Gastrointestinal: Negative for abdominal pain, nausea and vomiting.    Social History  Substance Use Topics  . Smoking status: Former Smoker    Packs/day: 0.50    Years: 20.00    Quit date: 05/01/1977  . Smokeless tobacco: Never Used  . Alcohol use Yes     Comment: moderate; 2 drinks daily   Objective:   BP 130/60 (BP Location: Right Arm, Patient Position: Sitting, Cuff Size: Normal)   Pulse (!) 59   Temp 98.2 F (36.8  C) (Oral)   Resp 16   Wt 145 lb (65.8 kg)   SpO2 98%   BMI 20.81 kg/m  Vitals:   12/28/16 1616  BP: 130/60  Pulse: (!) 59  Resp: 16  Temp: 98.2 F (36.8 C)  TempSrc: Oral  SpO2: 98%  Weight: 145 lb (65.8 kg)     Physical Exam  Tender along left para lumbar muscles and lumbar spine. No gross deformities.    Assessment & Plan:     1. DDD (degenerative disc disease), lumbosacral Worse over the last few months, but has just starting PT. I voided the September and October printed prescription for hydrocodone/apap and printed new prescription for #210 tablets for the next month.  He is to follow up with Dr. Sabra Heck. Follow up here in 3-4 weeks. Anticipate going back down to 6 tablets a day by the time of his return.        Lelon Huh, MD  [facility]

## 2016-12-28 NOTE — Progress Notes (Signed)
Testosterone IM Injection  Due to Hypogonadism patient is present today for a Testosterone Injection.  Medication: Testosterone Cypionate Dose: 0.69mL Location: right upper outer buttocks Lot: [id number] Exp:10/2017  Patient tolerated well, no complications were noted  Preformed by: Toniann Fail, LPN   Follow up: 3 weeks

## 2016-12-29 DIAGNOSIS — M545 Low back pain: Secondary | ICD-10-CM | POA: Diagnosis not present

## 2016-12-29 DIAGNOSIS — M5416 Radiculopathy, lumbar region: Secondary | ICD-10-CM | POA: Diagnosis not present

## 2016-12-30 ENCOUNTER — Ambulatory Visit: Payer: Medicare Other | Admitting: Family Medicine

## 2016-12-30 ENCOUNTER — Telehealth: Payer: Self-pay

## 2016-12-30 DIAGNOSIS — M5137 Other intervertebral disc degeneration, lumbosacral region: Secondary | ICD-10-CM

## 2016-12-30 NOTE — Telephone Encounter (Signed)
Manuela Schwartz from [facility] called in reference to his Hydrocodone/acetaminophen RX.  She states the RX that was dropped off today was a "Dramatic" increase and she states with all the other medications he is on (i.ei Zolpidem, Guaifenesin-codeine) it puts him at a much higher risk for fall.  She states he has already fallen a few weeks ago.  She is not able to fill it there without specific documentation justify in increase.    Please contact her at [phone].  Thanks,   -Mickel Baas

## 2017-01-03 DIAGNOSIS — R293 Abnormal posture: Secondary | ICD-10-CM | POA: Diagnosis not present

## 2017-01-03 DIAGNOSIS — M545 Low back pain: Secondary | ICD-10-CM | POA: Diagnosis not present

## 2017-01-03 MED ORDER — HYDROCODONE-ACETAMINOPHEN 7.5-325 MG PO TABS: ORAL_TABLET | ORAL | 0 refills | Status: DC

## 2017-01-03 NOTE — Telephone Encounter (Signed)
Patient is up at the front desk requesting that Dr. Caryn Section change his Hydrocodone prescription back to what it originally was so that he can get the prescription filled. Patient says he is willing to wait in the waiting room until this is done.

## 2017-01-03 NOTE — Telephone Encounter (Signed)
I have researched his chart and the day the testosterone level was too high, his blood was drawn one week after his injection.  At that time, he was receiving 1 mL q month.  We have now reduced his dose to 0.5 mL q month.  This should be an appropriate dose for him if he would like to continue the medication.

## 2017-01-04 NOTE — Telephone Encounter (Signed)
Pt has not answered his phone. Will make aware at next injection.

## 2017-01-05 DIAGNOSIS — M545 Low back pain: Secondary | ICD-10-CM | POA: Diagnosis not present

## 2017-01-05 DIAGNOSIS — M5416 Radiculopathy, lumbar region: Secondary | ICD-10-CM | POA: Diagnosis not present

## 2017-01-10 ENCOUNTER — Telehealth: Payer: Self-pay | Admitting: Family Medicine

## 2017-01-10 DIAGNOSIS — M545 Low back pain: Secondary | ICD-10-CM | POA: Diagnosis not present

## 2017-01-10 DIAGNOSIS — M5416 Radiculopathy, lumbar region: Secondary | ICD-10-CM | POA: Diagnosis not present

## 2017-01-11 ENCOUNTER — Ambulatory Visit (INDEPENDENT_AMBULATORY_CARE_PROVIDER_SITE_OTHER): Payer: Medicare Other | Admitting: Family Medicine

## 2017-01-11 ENCOUNTER — Encounter: Payer: Self-pay | Admitting: Family Medicine

## 2017-01-11 VITALS — BP 112/70 | HR 58 | Temp 98.4°F | Resp 16 | Ht 70.0 in | Wt 148.0 lb

## 2017-01-11 DIAGNOSIS — F5101 Primary insomnia: Secondary | ICD-10-CM | POA: Diagnosis not present

## 2017-01-11 DIAGNOSIS — F320 Major depressive disorder, single episode, mild: Secondary | ICD-10-CM | POA: Diagnosis not present

## 2017-01-11 DIAGNOSIS — R630 Anorexia: Secondary | ICD-10-CM

## 2017-01-11 DIAGNOSIS — R634 Abnormal weight loss: Secondary | ICD-10-CM | POA: Diagnosis not present

## 2017-01-11 DIAGNOSIS — Z23 Encounter for immunization: Secondary | ICD-10-CM | POA: Diagnosis not present

## 2017-01-11 DIAGNOSIS — I2510 Atherosclerotic heart disease of native coronary artery without angina pectoris: Secondary | ICD-10-CM | POA: Diagnosis not present

## 2017-01-11 MED ORDER — MIRTAZAPINE 7.5 MG PO TABS: 7.5000 mg | ORAL_TABLET | Freq: Every day | ORAL | 1 refills | Status: DC

## 2017-01-11 NOTE — Progress Notes (Signed)
Patient: Ivan Salazar Male    DOB: 1931-11-26   83 y.o.   MRN: [id number] Visit Date: 01/11/2017  Today's Provider: Lelon Huh, MD   Chief Complaint  Patient presents with  . Weight Loss   Subjective:    HPI Patient comes in today for evaluation of weight loss. He reports that he has lost over 12lbs over 6 months. He reports that he does not have an appetite, and it has progressively gotten worse over the last few months and he feels like he has to consciously force himself to eat. He does take Ambien nights and feels this helps him sleep well. He also admits to feeling more depressed lately regarding his health.   Wt Readings from Last 5 Encounters:  01/11/17 148 lb (67.1 kg)  12/28/16 145 lb (65.8 kg)  11/01/16 157 lb (71.2 kg)  10/18/16 152 lb (68.9 kg)  10/14/16 156 lb (70.8 kg)         Allergies  Allergen Reactions  . Celebrex [Celecoxib]     Noxius taste in mouth     Current Outpatient Prescriptions:  .  atorvastatin (LIPITOR) 80 MG tablet, TAKE 1 TABLET DAILY, Disp: 90 tablet, Rfl: 4 .  clopidogrel (PLAVIX) 75 MG tablet, TAKE 1 TABLET BY MOUTH DAILY, Disp: 90 tablet, Rfl: 4 .  enalapril (VASOTEC) 5 MG tablet, 1 TABLET, ORAL, DAILY, Disp: 90 tablet, Rfl: 4 .  fluticasone (FLONASE) 50 MCG/ACT nasal spray, PLACE 2 SPRAYS INTO BOTH NOSTRILS DAILY., Disp: 17 g, Rfl: 5 .  guaiFENesin-codeine (VIRTUSSIN A/C) 100-10 MG/5ML syrup, TAKE ONE OR TWO TEASPOONFULS (5-10ML) EVERY SIX HOURS AS NEEDED FOR COUGH, Disp: 180 mL, Rfl: 5 .  HYDROcodone-acetaminophen (NORCO) 7.5-325 MG tablet, 1 tablet every 4-6 hours as needed, Disp: 180 tablet, Rfl: 0 .  hydrocortisone 2.5 % cream, APPLY TO AFFECTED AREA(S) TWO TIMES A DAY AS NEEDED, Disp: 30 g, Rfl: 5 .  latanoprost (XALATAN) 0.005 % ophthalmic solution, Place 1 drop into both eyes at bedtime. , Disp: , Rfl:  .  loratadine (CLARITIN) 10 MG tablet, Take 10 mg by mouth daily., Disp: , Rfl:  .  metoprolol tartrate (LOPRESSOR)  25 MG tablet, Take 1 tablet (25 mg total) by mouth 2 (two) times daily., Disp: 180 tablet, Rfl: 4 .  nitroGLYCERIN (NITROSTAT) 0.4 MG SL tablet, Place 1 tablet (0.4 mg total) under the tongue every 5 (five) minutes as needed for chest pain., Disp: 20 tablet, Rfl: 1 .  ranitidine (ZANTAC) 300 MG tablet, TAKE 1 TABLET TWICE DAILY, Disp: 180 tablet, Rfl: 3 .  testosterone cypionate (DEPOTESTOSTERONE CYPIONATE) 200 MG/ML injection, Inject 1 mL (200 mg total) into the muscle every 28 (twenty-eight) days., Disp: 10 mL, Rfl: 0 .  zolpidem (AMBIEN) 10 MG tablet, TAKE 0.5-1 TABLETS BY MOUTH EVERY NIGHT AT BEDTIME, Disp: 30 tablet, Rfl: 2  Review of Systems  Constitutional: Positive for activity change, appetite change and fatigue.  Gastrointestinal: Positive for constipation.  Endocrine: Negative.   Genitourinary: Negative.   Musculoskeletal: Positive for arthralgias, back pain and myalgias.  Neurological: Negative.     Social History  Substance Use Topics  . Smoking status: Former Smoker    Packs/day: 0.50    Years: 20.00    Quit date: 05/01/1977  . Smokeless tobacco: Never Used  . Alcohol use Yes     Comment: moderate; 2 drinks daily   Objective:   BP 112/70 (BP Location: Right Arm, Patient Position: Sitting, Cuff Size: Normal)  Pulse (!) 58   Temp 98.4 F (36.9 C)   Resp 16   Ht 5\' 10"  (1.778 m)   Wt 148 lb (67.1 kg)   SpO2 97%   BMI 21.24 kg/m  Vitals:   01/11/17 1359  BP: 112/70  Pulse: (!) 58  Resp: 16  Temp: 98.4 F (36.9 C)  SpO2: 97%  Weight: 148 lb (67.1 kg)  Height: 5\' 10"  (1.778 m)     Physical Exam  1. Weight loss Reassured that he is not underweight or malnourished   2. Appetite loss  - mirtazapine (REMERON) 7.5 MG tablet; Take 1 tablet (7.5 mg total) by mouth at bedtime.  Dispense: 30 tablet; Refill: 1  3. Current mild episode of major depressive disorder without prior episode (HCC)  - mirtazapine (REMERON) 7.5 MG tablet; Take 1 tablet (7.5 mg total)  by mouth at bedtime.  Dispense: 30 tablet; Refill: 1  4. Primary insomnia Counseled on potential adverse effects of zolpidem and that mirtazapine would likely help with sleep while also helping depression and appetite.  - mirtazapine (REMERON) 7.5 MG tablet; Take 1 tablet (7.5 mg total) by mouth at bedtime.  Dispense: 30 tablet; Refill: 1  5. Influenza vaccine needed  - Flu vaccine HIGH DOSE PF (Fluzone High dose)  Return in about 4 weeks (around 02/08/2017).     Assessment & Plan:           Lelon Huh, MD  [facility]

## 2017-01-11 NOTE — Patient Instructions (Addendum)
  Medications Discontinued During This Encounter  Medication Reason  . zolpidem (AMBIEN) 10 MG tablet     Meds ordered this encounter  Medications  . mirtazapine (REMERON) 7.5 MG tablet    Sig: Take 1 tablet (7.5 mg total) by mouth at bedtime.    Dispense:  30 tablet    Refill:  1

## 2017-01-12 ENCOUNTER — Telehealth: Payer: Self-pay | Admitting: *Deleted

## 2017-01-12 DIAGNOSIS — M545 Low back pain: Secondary | ICD-10-CM | POA: Diagnosis not present

## 2017-01-12 DIAGNOSIS — M5416 Radiculopathy, lumbar region: Secondary | ICD-10-CM | POA: Diagnosis not present

## 2017-01-12 NOTE — Telephone Encounter (Signed)
Patient stopped by office concerning a reaction he had to mirtazapine after he took it before bed last night. Patient stated that he woke-up sometime in the night and had a terrible taste in his month and it took him 2 hours to go back to sleep. Patient wants to let provider know that he wants to stay on zolpidem for now and that he will not be taking mirtazapine again.

## 2017-01-18 ENCOUNTER — Ambulatory Visit (INDEPENDENT_AMBULATORY_CARE_PROVIDER_SITE_OTHER): Payer: Medicare Other

## 2017-01-18 DIAGNOSIS — E291 Testicular hypofunction: Secondary | ICD-10-CM

## 2017-01-18 MED ORDER — TESTOSTERONE CYPIONATE 200 MG/ML IM SOLN
200.0000 mg | Freq: Once | INTRAMUSCULAR | Status: AC
  Administered 2017-01-18: 200 mg via INTRAMUSCULAR

## 2017-01-18 NOTE — Progress Notes (Signed)
Testosterone IM Injection  Due to Hypogonadism patient is present today for a Testosterone Injection.  Medication: Testosterone Cypionate Dose: 0.2mL Location: right upper outer buttocks Lot: [id number] Exp:10/2017  Patient tolerated well, no complications were noted  Preformed by: Toniann Fail, LPN   Follow up: 1 month, pt was made aware of why his dosage was decreased.

## 2017-01-19 DIAGNOSIS — M5416 Radiculopathy, lumbar region: Secondary | ICD-10-CM | POA: Diagnosis not present

## 2017-01-19 DIAGNOSIS — M545 Low back pain: Secondary | ICD-10-CM | POA: Diagnosis not present

## 2017-01-24 DIAGNOSIS — M545 Low back pain: Secondary | ICD-10-CM | POA: Diagnosis not present

## 2017-01-24 DIAGNOSIS — M5416 Radiculopathy, lumbar region: Secondary | ICD-10-CM | POA: Diagnosis not present

## 2017-01-25 ENCOUNTER — Other Ambulatory Visit: Payer: Self-pay | Admitting: Family Medicine

## 2017-01-25 ENCOUNTER — Ambulatory Visit: Payer: Self-pay | Admitting: Family Medicine

## 2017-01-25 NOTE — Telephone Encounter (Signed)
Please call in zolpidem  

## 2017-01-25 NOTE — Telephone Encounter (Signed)
prescription has been called into pharmacy. KW

## 2017-02-01 NOTE — Telephone Encounter (Signed)
LM today about scheduling awv

## 2017-02-07 DIAGNOSIS — M5416 Radiculopathy, lumbar region: Secondary | ICD-10-CM | POA: Diagnosis not present

## 2017-02-07 DIAGNOSIS — M545 Low back pain: Secondary | ICD-10-CM | POA: Diagnosis not present

## 2017-02-08 ENCOUNTER — Other Ambulatory Visit: Payer: Self-pay | Admitting: Family Medicine

## 2017-02-10 DIAGNOSIS — M533 Sacrococcygeal disorders, not elsewhere classified: Secondary | ICD-10-CM | POA: Diagnosis not present

## 2017-02-14 DIAGNOSIS — M5416 Radiculopathy, lumbar region: Secondary | ICD-10-CM | POA: Diagnosis not present

## 2017-02-14 DIAGNOSIS — M545 Low back pain: Secondary | ICD-10-CM | POA: Diagnosis not present

## 2017-02-15 ENCOUNTER — Ambulatory Visit: Payer: Self-pay | Admitting: Family Medicine

## 2017-02-15 ENCOUNTER — Encounter: Payer: Self-pay | Admitting: Urology

## 2017-02-15 ENCOUNTER — Ambulatory Visit: Payer: Medicare Other

## 2017-02-22 ENCOUNTER — Ambulatory Visit: Payer: Medicare Other | Admitting: Family Medicine

## 2017-02-22 ENCOUNTER — Ambulatory Visit (INDEPENDENT_AMBULATORY_CARE_PROVIDER_SITE_OTHER): Payer: Medicare Other

## 2017-02-22 DIAGNOSIS — E291 Testicular hypofunction: Secondary | ICD-10-CM

## 2017-02-22 MED ORDER — TESTOSTERONE CYPIONATE 200 MG/ML IM SOLN
200.0000 mg | Freq: Once | INTRAMUSCULAR | Status: AC
  Administered 2017-02-22: 200 mg via INTRAMUSCULAR

## 2017-02-22 NOTE — Progress Notes (Signed)
Testosterone IM Injection  Due to Hypogonadism patient is present today for a Testosterone Injection.  Medication: Testosterone Cypionate Dose: 0.61mL Location: right upper outer buttocks Lot: [id number] Exp:10/2017  Patient tolerated well, no complications were noted  Preformed by: Toniann Fail, LPN   Follow up: 4 weeks

## 2017-02-23 ENCOUNTER — Encounter: Payer: Self-pay | Admitting: Family Medicine

## 2017-02-23 ENCOUNTER — Ambulatory Visit (INDEPENDENT_AMBULATORY_CARE_PROVIDER_SITE_OTHER): Payer: Medicare Other | Admitting: Family Medicine

## 2017-02-23 VITALS — BP 116/58 | HR 50 | Temp 98.4°F | Resp 16 | Ht 70.0 in | Wt 148.0 lb

## 2017-02-23 DIAGNOSIS — R634 Abnormal weight loss: Secondary | ICD-10-CM | POA: Diagnosis not present

## 2017-02-23 DIAGNOSIS — I2510 Atherosclerotic heart disease of native coronary artery without angina pectoris: Secondary | ICD-10-CM | POA: Diagnosis not present

## 2017-02-23 DIAGNOSIS — R42 Dizziness and giddiness: Secondary | ICD-10-CM

## 2017-02-23 NOTE — Progress Notes (Signed)
Patient: Ivan Salazar Male    DOB: 08-Nov-1931   83 y.o.   MRN: [id number] Visit Date: 02/23/2017  Today's Provider: Lelon Huh, MD   Chief Complaint  Patient presents with  . Weight Loss   Subjective:    HPI Appetite loss Patient was last seen in the office 1 month ago. He was started on mirtazepine, but he had to discontinue due to side effects. He reports that it gave him a bad taste in his mouth. He would like to stay on the zolpidem.  He feels that his appetite is gradually getting better. He now has a taste for ice cream. He is currently at 148lbs.   He states he has had a couple brief episodes of light headedness over the last few months, no chest pains or pressure, no dyspnea.   Wt Readings from Last 3 Encounters:  02/23/17 148 lb (67.1 kg)  01/11/17 148 lb (67.1 kg)  12/28/16 145 lb (65.8 kg)   BP Readings from Last 5 Encounters:  02/23/17 (!) 116/58  01/11/17 112/70  12/28/16 130/60  11/01/16 (!) 108/52  10/18/16 (!) 110/54       Allergies  Allergen Reactions  . Celebrex [Celecoxib]     Noxius taste in mouth  . Mirtazapine     Bad taste in mouth. Refuses to take again     Current Outpatient Prescriptions:  .  atorvastatin (LIPITOR) 80 MG tablet, TAKE 1 TABLET DAILY, Disp: 90 tablet, Rfl: 4 .  clopidogrel (PLAVIX) 75 MG tablet, TAKE 1 TABLET BY MOUTH DAILY, Disp: 90 tablet, Rfl: 4 .  enalapril (VASOTEC) 5 MG tablet, 1 TABLET, ORAL, DAILY, Disp: 90 tablet, Rfl: 4 .  fluticasone (FLONASE) 50 MCG/ACT nasal spray, PLACE 2 SPRAYS INTO BOTH NOSTRILS DAILY., Disp: 17 g, Rfl: 5 .  HYDROcodone-acetaminophen (NORCO) 7.5-325 MG tablet, 1 tablet every 4-6 hours as needed, Disp: 180 tablet, Rfl: 0 .  hydrocortisone 2.5 % cream, APPLY TO AFFECTED AREA(S) TWO TIMES A DAY AS NEEDED, Disp: 30 g, Rfl: 5 .  latanoprost (XALATAN) 0.005 % ophthalmic solution, Place 1 drop into both eyes at bedtime. , Disp: , Rfl:  .  loratadine (CLARITIN) 10 MG tablet, Take 10 mg by  mouth daily., Disp: , Rfl:  .  metoprolol tartrate (LOPRESSOR) 25 MG tablet, TAKE ONE TABLET BY MOUTH TWO TIMES A DAY, Disp: 180 tablet, Rfl: 4 .  nitroGLYCERIN (NITROSTAT) 0.4 MG SL tablet, Place 1 tablet (0.4 mg total) under the tongue every 5 (five) minutes as needed for chest pain., Disp: 20 tablet, Rfl: 1 .  ranitidine (ZANTAC) 300 MG tablet, TAKE 1 TABLET TWICE DAILY, Disp: 180 tablet, Rfl: 3 .  testosterone cypionate (DEPOTESTOSTERONE CYPIONATE) 200 MG/ML injection, Inject 1 mL (200 mg total) into the muscle every 28 (twenty-eight) days., Disp: 10 mL, Rfl: 0 .  zolpidem (AMBIEN) 10 MG tablet, TAKE ONE-HALF TO ONE TABLET BY MOUTH EVERY NIGHT AT BEDTIME, Disp: 30 tablet, Rfl: 3 .  guaiFENesin-codeine (VIRTUSSIN A/C) 100-10 MG/5ML syrup, TAKE ONE OR TWO TEASPOONFULS (5-10ML) EVERY SIX HOURS AS NEEDED FOR COUGH (Patient not taking: Reported on 02/23/2017), Disp: 180 mL, Rfl: 5  Review of Systems  Constitutional: Positive for appetite change. Negative for activity change, fatigue and unexpected weight change.  Cardiovascular: Negative for chest pain, palpitations and leg swelling.  Musculoskeletal: Negative.   Neurological: Positive for light-headedness.    Social History  Substance Use Topics  . Smoking status: Former Smoker  Packs/day: 0.50    Years: 20.00    Quit date: 05/01/1977  . Smokeless tobacco: Never Used  . Alcohol use Yes     Comment: moderate; 2 drinks daily   Objective:   BP (!) 116/58 (BP Location: Left Arm, Patient Position: Sitting, Cuff Size: Normal)   Pulse (!) 50   Temp 98.4 F (36.9 C)   Resp 16   Ht 5\' 10"  (1.778 m)   Wt 148 lb (67.1 kg)   SpO2 99%   BMI 21.24 kg/m  Vitals:   02/23/17 1203  BP: (!) 116/58  Pulse: (!) 50  Resp: 16  Temp: 98.4 F (36.9 C)  SpO2: 99%  Weight: 148 lb (67.1 kg)  Height: 5\' 10"  (1.778 m)     Physical Exam   General Appearance:    Alert, cooperative, no distress  Eyes:    PERRL, conjunctiva/corneas clear, EOM's  intact       Lungs:     Clear to auscultation bilaterally, respirations unlabored  Heart:    Regular rate and rhythm  Neurologic:   Awake, alert, oriented x 3. No apparent focal neurological           defect.       EKG : No acute changes.     Assessment & Plan:     1. Dizziness He likely has some mild orthostasis from his ACEI and betablocker. He was advised to discuss this with his cardiologist. Counseled to be sure to drink plenty of fluids and stand slowly when getting up.  - EKG 12-Lead  2. Weight loss Has stabilized the last couple of months with improvement in his appetite.        Lelon Huh, MD  [facility]

## 2017-02-27 ENCOUNTER — Other Ambulatory Visit: Payer: Self-pay | Admitting: Family Medicine

## 2017-03-01 ENCOUNTER — Telehealth: Payer: Self-pay | Admitting: Family Medicine

## 2017-03-17 ENCOUNTER — Other Ambulatory Visit: Payer: Self-pay | Admitting: Family Medicine

## 2017-03-17 DIAGNOSIS — M5137 Other intervertebral disc degeneration, lumbosacral region: Secondary | ICD-10-CM

## 2017-03-17 NOTE — Telephone Encounter (Signed)
Pt contacted office for refill request on the following medications:  HYDROcodone-acetaminophen ([place]) 7.5-325 MG tablet  CB#[id number]/MW  Pt states his last Rx was 03/04/17.  Pt is requesting 3 different Rx.

## 2017-03-20 MED ORDER — HYDROCODONE-ACETAMINOPHEN 7.5-325 MG PO TABS: ORAL_TABLET | ORAL | 0 refills | Status: DC

## 2017-03-29 ENCOUNTER — Other Ambulatory Visit: Payer: Self-pay | Admitting: Family Medicine

## 2017-03-29 ENCOUNTER — Ambulatory Visit (INDEPENDENT_AMBULATORY_CARE_PROVIDER_SITE_OTHER): Payer: Medicare Other

## 2017-03-29 DIAGNOSIS — R053 Chronic cough: Secondary | ICD-10-CM

## 2017-03-29 DIAGNOSIS — E291 Testicular hypofunction: Secondary | ICD-10-CM | POA: Diagnosis not present

## 2017-03-29 DIAGNOSIS — R05 Cough: Secondary | ICD-10-CM

## 2017-03-29 MED ORDER — TESTOSTERONE CYPIONATE 200 MG/ML IM SOLN
200.0000 mg | Freq: Once | INTRAMUSCULAR | Status: AC
  Administered 2017-03-29: 200 mg via INTRAMUSCULAR

## 2017-03-29 NOTE — Progress Notes (Signed)
Testosterone IM Injection  Due to Hypogonadism patient is present today for a Testosterone Injection.  Medication: Testosterone Cypionate Dose: 0.92mL Location: left upper outer buttocks Lot: [id number] Exp:10/2017  Patient tolerated well, no complications were noted  Preformed by: Toniann Fail, LPN   Follow up: 1 month

## 2017-04-05 ENCOUNTER — Ambulatory Visit (INDEPENDENT_AMBULATORY_CARE_PROVIDER_SITE_OTHER): Payer: Medicare Other

## 2017-04-05 ENCOUNTER — Telehealth: Payer: Self-pay | Admitting: Family Medicine

## 2017-04-05 VITALS — BP 118/52 | HR 60 | Temp 98.5°F | Ht 70.0 in | Wt 152.8 lb

## 2017-04-05 DIAGNOSIS — Z0000 Encounter for general adult medical examination without abnormal findings: Secondary | ICD-10-CM | POA: Diagnosis not present

## 2017-04-05 NOTE — Telephone Encounter (Signed)
If is having any chills, fevers, or shortness of breath or chest pain then he should come in to be checked out. Otherwise he can trying taking OTC Mucinex DM every night before he goes to bed to break up cough.

## 2017-04-05 NOTE — Telephone Encounter (Signed)
Advised patient as below.  

## 2017-04-05 NOTE — Telephone Encounter (Signed)
Patient wants to let you know that he still has a cough especially in the morning when he gets up.  He states that he cough is about the same.  He is still taking the cough syrup that you gave him and seems suppress the cough.

## 2017-04-05 NOTE — Progress Notes (Signed)
Subjective:   Ivan Salazar is a 83 y.o. male who presents for an Initial Medicare Annual Wellness Visit.  Review of Systems  N/A  Cardiac Risk Factors include: advanced age (>63men, >28 women);dyslipidemia;hypertension;male gender    Objective:    Today's Vitals   04/05/17 1052 04/05/17 1058  BP: (!) 122/50 (!) 118/52  Pulse: 60   Temp: 98.5 F (36.9 C)   TempSrc: Oral   Weight: 152 lb 12.8 oz (69.3 kg)   Height: 5\' 10"  (1.778 m)   PainSc: 0-No pain 0-No pain   Body mass index is 21.92 kg/m.  Advanced Directives 04/05/2017 10/14/2016 10/23/2014  Does Patient Have a Medical Advance Directive? Yes No Yes  Type of Advance Directive Living will;Healthcare Power of Attorney - Living will;Healthcare Power of [place] in Chart? No - copy requested - -    Current Medications (verified) Outpatient Encounter Medications as of 04/05/2017  Medication Sig  . atorvastatin (LIPITOR) 80 MG tablet TAKE 1 TABLET DAILY  . clopidogrel (PLAVIX) 75 MG tablet TAKE 1 TABLET BY MOUTH DAILY  . enalapril (VASOTEC) 5 MG tablet TAKE ONE TABLET BY MOUTH DAILY  . fluticasone (FLONASE) 50 MCG/ACT nasal spray PLACE 2 SPRAYS INTO BOTH NOSTRILS DAILY. (Patient taking differently: Place 2 sprays into both nostrils daily. )  . guaiFENesin-codeine (CHERATUSSIN AC) 100-10 MG/5ML syrup TAKE 1-2 TEASPOONS EVERY 6 HOURS AS NEEDED FOR COUGH  . HYDROcodone-acetaminophen (NORCO) 7.5-325 MG tablet 1 tablet every 4-6 hours as needed  . hydrocortisone 2.5 % cream APPLY TO AFFECTED AREA(S) TWO TIMES A DAY AS NEEDED  . latanoprost (XALATAN) 0.005 % ophthalmic solution Place 1 drop into both eyes at bedtime.   . metoprolol tartrate (LOPRESSOR) 25 MG tablet TAKE ONE TABLET BY MOUTH TWO TIMES A DAY  . nitroGLYCERIN (NITROSTAT) 0.4 MG SL tablet Place 1 tablet (0.4 mg total) under the tongue every 5 (five) minutes as needed for chest pain.  . ranitidine (ZANTAC) 300 MG tablet TAKE 1  TABLET TWICE DAILY  . testosterone cypionate (DEPOTESTOSTERONE CYPIONATE) 200 MG/ML injection Inject 1 mL (200 mg total) into the muscle every 28 (twenty-eight) days.  Marland Kitchen zolpidem (AMBIEN) 10 MG tablet TAKE ONE-HALF TO ONE TABLET BY MOUTH EVERY NIGHT AT BEDTIME  . [DISCONTINUED] loratadine (CLARITIN) 10 MG tablet Take 10 mg by mouth daily.   No facility-administered encounter medications on file as of 04/05/2017.     Allergies (verified) Celebrex [celecoxib]; Mirtazapine; and Prednisone   History: Past Medical History:  Diagnosis Date  . BPH (benign prostatic hyperplasia)   . CAD (coronary artery disease)   . CAD (coronary artery disease)   . Carpal tunnel syndrome   . DDD (degenerative disc disease), cervical   . HLD (hyperlipidemia)   . Hypertension   . Hypogonadism in male   . Impotence   . Nocturia   . Weight loss    Past Surgical History:  Procedure Laterality Date  . APPENDECTOMY    . BILATERAL CARPAL TUNNEL RELEASE     05/12/2011, 06/09/2011  . CARDIAC CATHETERIZATION  2004  . CORONARY ANGIOPLASTY    . CORONARY ARTERY BYPASS GRAFT  2007   5; [facility]  . CORONARY STENT PLACEMENT  2005  . ESOPHAGOGASTRODUODENOSCOPY (EGD) WITH PROPOFOL N/A 10/28/2015   Procedure: ESOPHAGOGASTRODUODENOSCOPY (EGD) WITH PROPOFOL;  Surgeon: Manya Silvas, MD;  Location: [facility] ENDOSCOPY;  Service: Endoscopy;  Laterality: N/A;  . TONSILLECTOMY    . UPPER GI ENDOSCOPY  08/02/2010  Dr. Tedra Coupe, gastritis. H Pylori negative   Family History  Problem Relation Age of Onset  . Cirrhosis Father        of liver  . Kidney disease Neg Hx   . Prostate cancer Neg Hx   . Kidney cancer Neg Hx   . Bladder Cancer Neg Hx    Social History   Socioeconomic History  . Marital status: Married    Spouse name: None  . Number of children: 5  . Years of education: None  . Highest education level: None  Social Needs  . Financial resource strain: None  . Food insecurity - worry: None  . Food insecurity  - inability: None  . Transportation needs - medical: None  . Transportation needs - non-medical: None  Occupational History  . Occupation: Retired  Tobacco Use  . Smoking status: Former Smoker    Packs/day: 0.50    Years: 20.00    Pack years: 10.00    Last attempt to quit: 05/01/1977    Years since quitting: 39.9  . Smokeless tobacco: Never Used  Substance and Sexual Activity  . Alcohol use: Yes    Alcohol/week: 8.4 oz    Types: 14 Shots of liquor per week    Comment: moderate; 2 drinks daily  . Drug use: No  . Sexual activity: None  Other Topics Concern  . None  Social History Narrative  . None   Tobacco Counseling Counseling given: Not Answered   Clinical Intake:  Pre-visit preparation completed: Yes  Pain : No/denies pain Pain Score: 0-No pain     Nutritional Status: BMI of 19-24  Normal Nutritional Risks: None Diabetes: No  Activities of Daily Living: Independent Ambulation: Independent with device- listed below Home Assistive Devices/Equipment: Eyeglasses Medication Administration: Independent Home Management: Independent  Barriers to Care Management & Learning: None  Do you feel unsafe in your current relationship?: No(widowed) Do you feel physically threatened by others?: No Anyone hurting you at home, work, or school?: No Unable to ask?: No Information provided on Community resources: No  How often do you need to have someone help you when you read instructions, pamphlets, or other written materials from your doctor or pharmacy?: 1 - Never What is the last grade level you completed in school?: MBA  Interpreter Needed?: No  Information entered by :: [facility], LPN  Activities of Daily Living In your present state of health, do you have any difficulty performing the following activities: 04/05/2017  Hearing? Y  Comment pt has seen an audiologist in the past but does not plan to follow up again  Vision? N  Difficulty concentrating or making  decisions? N  Walking or climbing stairs? N  Dressing or bathing? N  Doing errands, shopping? N  Preparing Food and eating ? N  Using the Toilet? N  In the past six months, have you accidently leaked urine? N  Do you have problems with loss of bowel control? N  Managing your Medications? N  Managing your Finances? N  Housekeeping or managing your Housekeeping? N  Some recent data might be hidden    Immunizations and Health Maintenance Immunization History  Administered Date(s) Administered  . Influenza, High Dose Seasonal PF 02/17/2015, 01/11/2017  . Influenza-Unspecified 01/29/2016  . Pneumococcal Polysaccharide-23 05/29/2007  . Td 03/20/2002, 02/19/2008  . Zoster 02/09/2010   Health Maintenance Due  Topic Date Due  . PNA vac Low Risk Adult (2 of 2 - PCV13) 05/28/2008    Patient Care Team: Lelon Huh  E, MD as PCP - General (Family Medicine) Dasher, Rayvon Char, MD (Dermatology) Tamsen Meek, MD as Referring Physician (Dermatology)  Indicate any recent Medical Services you may have received from other than [facility] providers in the past year (date may be approximate).    Assessment:   This is a routine wellness examination for Ivan Salazar.   Hearing/Vision screen Vision Screening Comments: Pt [facility] to [facility] checks yearly.   Dietary issues and exercise activities discussed: Current Exercise Habits: The patient does not participate in regular exercise at present(Pt normally walks and stretches but has not been recently. )  Goals    . Exercise 3x per week (30 min per time)     Recommend for pt to start back walking and stretching 3 days a week for at least 30 minutes.       Depression Screen PHQ 2/9 Scores 04/05/2017 09/14/2016 03/03/2015  PHQ - 2 Score 0 0 0    Fall Risk Fall Risk  04/05/2017 09/14/2016 03/03/2015 02/17/2015  Falls in the past year? Yes No Yes Yes  Number falls in past yr: 2 or more - 2 or more 1  Injury with Fall? No - Yes  Yes  Follow up Falls prevention discussed - Falls evaluation completed -    Is the patient's home free of loose throw rugs in walkways, pet beds, electrical cords, etc?   yes      Grab bars in the bathroom? yes      Handrails on the stairs?   N/A      Adequate lighting?   yes  Cognitive Function: Pt declined screening today.        Screening Tests Health Maintenance  Topic Date Due  . PNA vac Low Risk Adult (2 of 2 - PCV13) 05/28/2008  . TETANUS/TDAP  02/18/2018  . INFLUENZA VACCINE  Completed   Cancer Screenings: Lung: Low Dose CT Chest recommended if Age 71-80 years, 30 pack-year currently smoking OR have quit w/in 15years. Patient does not qualify. Colorectal: Up to date  Additional Screenings:  Hepatitis B/HIV/Syphillis: Pt declined Hepatitis C Screening: Pt declined      Plan:  I have personally reviewed and addressed the Medicare Annual Wellness questionnaire and have noted the following in the patient's chart:  A. Medical and social history B. Use of alcohol, tobacco or illicit drugs  C. Current medications and supplements D. Functional ability and status E.  Nutritional status F.  Physical activity G. Advance directives H. List of other physicians I.  Hospitalizations, surgeries, and ER visits in previous 12 months J.  [place] such as hearing and vision if needed, cognitive and depression L. Referrals and appointments - none  In addition, I have reviewed and discussed with patient certain preventive protocols, quality metrics, and best practice recommendations. A written personalized care plan for preventive services as well as general preventive health recommendations were provided to patient.  See attached scanned questionnaire for additional information.   Signed,  Fabio Neighbors, LPN Nurse Health Advisor   Nurse Recommendations: Pt to receive Prevnar 13 at next OV with PCP. Pt declined today and states he has "stuff going on right  now."

## 2017-04-05 NOTE — Patient Instructions (Addendum)
Ivan Salazar , Thank you for taking time to come for your Medicare Wellness Visit. I appreciate your ongoing commitment to your health goals. Please review the following plan we discussed and let me know if I can assist you in the future.   Screening recommendations/referrals: Colonoscopy: up to date Recommended yearly ophthalmology/optometry visit for glaucoma screening and checkup Recommended yearly dental visit for hygiene and checkup  Vaccinations: Influenza vaccine: up to date Pneumococcal vaccine: Pneumovax 23 completed, declined Prevnar 13 today Tdap vaccine: up to date Shingles vaccine: completed last on 01/2008    Advanced directives: Please bring a copy of your POA (Power of [place]) and/or Living Will to your next appointment.   Conditions/risks identified: Fall risk prevention; Recommend for pt to start back walking and stretching 3 days a week for at least 30 minutes.   Next appointment: 05/11/17  Preventive Care 65 Years and Older, Male Preventive care refers to lifestyle choices and visits with your health care provider that can promote health and wellness. What does preventive care include?  A yearly physical exam. This is also called an annual well check.  Dental exams once or twice a year.  Routine eye exams. Ask your health care provider how often you should have your eyes checked.  Personal lifestyle choices, including:  Daily care of your teeth and gums.  Regular physical activity.  Eating a healthy diet.  Avoiding tobacco and drug use.  Limiting alcohol use.  Practicing safe sex.  Taking low doses of aspirin every day.  Taking vitamin and mineral supplements as recommended by your health care provider. What happens during an annual well check? The services and screenings done by your health care provider during your annual well check will depend on your age, overall health, lifestyle risk factors, and family history of disease. Counseling  Your  health care provider may ask you questions about your:  Alcohol use.  Tobacco use.  Drug use.  Emotional well-being.  Home and relationship well-being.  Sexual activity.  Eating habits.  History of falls.  Memory and ability to understand (cognition).  Work and work Statistician. Screening  You may have the following tests or measurements:  Height, weight, and BMI.  Blood pressure.  Lipid and cholesterol levels. These may be checked every 5 years, or more frequently if you are over 11 years old.  Skin check.  Lung cancer screening. You may have this screening every year starting at age 64 if you have a 30-pack-year history of smoking and currently smoke or have quit within the past 15 years.  Fecal occult blood test (FOBT) of the stool. You may have this test every year starting at age 69.  Flexible sigmoidoscopy or colonoscopy. You may have a sigmoidoscopy every 5 years or a colonoscopy every 10 years starting at age 64.  Prostate cancer screening. Recommendations will vary depending on your family history and other risks.  Hepatitis C blood test.  Hepatitis B blood test.  Sexually transmitted disease (STD) testing.  Diabetes screening. This is done by checking your blood sugar (glucose) after you have not eaten for a while (fasting). You may have this done every 1-3 years.  Abdominal aortic aneurysm (AAA) screening. You may need this if you are a current or former smoker.  Osteoporosis. You may be screened starting at age 10 if you are at high risk. Talk with your health care provider about your test results, treatment options, and if necessary, the need for more tests. Vaccines  Your  health care provider may recommend certain vaccines, such as:  Influenza vaccine. This is recommended every year.  Tetanus, diphtheria, and acellular pertussis (Tdap, Td) vaccine. You may need a Td booster every 10 years.  Zoster vaccine. You may need this after age  9.  Pneumococcal 13-valent conjugate (PCV13) vaccine. One dose is recommended after age 54.  Pneumococcal polysaccharide (PPSV23) vaccine. One dose is recommended after age 68. Talk to your health care provider about which screenings and vaccines you need and how often you need them. This information is not intended to replace advice given to you by your health care provider. Make sure you discuss any questions you have with your health care provider. Document Released: 05/15/2015 Document Revised: 01/06/2016 Document Reviewed: 02/17/2015 Elsevier Interactive Patient Education  2017 [place] Prevention in the Home Falls can cause injuries. They can happen to people of all ages. There are many things you can do to make your home safe and to help prevent falls. What can I do on the outside of my home?  Regularly fix the edges of walkways and driveways and fix any cracks.  Remove anything that might make you trip as you walk through a door, such as a raised step or threshold.  Trim any bushes or trees on the path to your home.  Use bright outdoor lighting.  Clear any walking paths of anything that might make someone trip, such as rocks or tools.  Regularly check to see if handrails are loose or broken. Make sure that both sides of any steps have handrails.  Any raised decks and porches should have guardrails on the edges.  Have any leaves, snow, or ice cleared regularly.  Use sand or salt on walking paths during winter.  Clean up any spills in your garage right away. This includes oil or grease spills. What can I do in the bathroom?  Use night lights.  Install grab bars by the toilet and in the tub and shower. Do not use towel bars as grab bars.  Use non-skid mats or decals in the tub or shower.  If you need to sit down in the shower, use a plastic, non-slip stool.  Keep the floor dry. Clean up any water that spills on the floor as soon as it happens.  Remove  soap buildup in the tub or shower regularly.  Attach bath mats securely with double-sided non-slip rug tape.  Do not have throw rugs and other things on the floor that can make you trip. What can I do in the bedroom?  Use night lights.  Make sure that you have a light by your bed that is easy to reach.  Do not use any sheets or blankets that are too big for your bed. They should not hang down onto the floor.  Have a firm chair that has side arms. You can use this for support while you get dressed.  Do not have throw rugs and other things on the floor that can make you trip. What can I do in the kitchen?  Clean up any spills right away.  Avoid walking on wet floors.  Keep items that you use a lot in easy-to-reach places.  If you need to reach something above you, use a strong step stool that has a grab bar.  Keep electrical cords out of the way.  Do not use floor polish or wax that makes floors slippery. If you must use wax, use non-skid floor wax.  Do not  have throw rugs and other things on the floor that can make you trip. What can I do with my stairs?  Do not leave any items on the stairs.  Make sure that there are handrails on both sides of the stairs and use them. Fix handrails that are broken or loose. Make sure that handrails are as long as the stairways.  Check any carpeting to make sure that it is firmly attached to the stairs. Fix any carpet that is loose or worn.  Avoid having throw rugs at the top or bottom of the stairs. If you do have throw rugs, attach them to the floor with carpet tape.  Make sure that you have a light switch at the top of the stairs and the bottom of the stairs. If you do not have them, ask someone to add them for you. What else can I do to help prevent falls?  Wear shoes that:  Do not have high heels.  Have rubber bottoms.  Are comfortable and fit you well.  Are closed at the toe. Do not wear sandals.  If you use a  stepladder:  Make sure that it is fully opened. Do not climb a closed stepladder.  Make sure that both sides of the stepladder are locked into place.  Ask someone to hold it for you, if possible.  Clearly mark and make sure that you can see:  Any grab bars or handrails.  First and last steps.  Where the edge of each step is.  Use tools that help you move around (mobility aids) if they are needed. These include:  Canes.  Walkers.  Scooters.  Crutches.  Turn on the lights when you go into a dark area. Replace any light bulbs as soon as they burn out.  Set up your furniture so you have a clear path. Avoid moving your furniture around.  If any of your floors are uneven, fix them.  If there are any pets around you, be aware of where they are.  Review your medicines with your doctor. Some medicines can make you feel dizzy. This can increase your chance of falling. Ask your doctor what other things that you can do to help prevent falls. This information is not intended to replace advice given to you by your health care provider. Make sure you discuss any questions you have with your health care provider. Document Released: 02/12/2009 Document Revised: 09/24/2015 Document Reviewed: 05/23/2014 Elsevier Interactive Patient Education  2017 [employer].

## 2017-04-05 NOTE — Telephone Encounter (Signed)
LMTCB

## 2017-04-05 NOTE — Telephone Encounter (Signed)
Please advise 

## 2017-04-14 ENCOUNTER — Ambulatory Visit: Payer: Medicare Other | Admitting: Family Medicine

## 2017-04-17 ENCOUNTER — Encounter: Payer: Self-pay | Admitting: Family Medicine

## 2017-04-17 ENCOUNTER — Ambulatory Visit (INDEPENDENT_AMBULATORY_CARE_PROVIDER_SITE_OTHER): Payer: Medicare Other | Admitting: Family Medicine

## 2017-04-17 VITALS — BP 142/60 | HR 52 | Temp 97.6°F | Resp 18 | Wt 152.0 lb

## 2017-04-17 DIAGNOSIS — I2510 Atherosclerotic heart disease of native coronary artery without angina pectoris: Secondary | ICD-10-CM | POA: Diagnosis not present

## 2017-04-17 DIAGNOSIS — J40 Bronchitis, not specified as acute or chronic: Secondary | ICD-10-CM | POA: Diagnosis not present

## 2017-04-17 MED ORDER — AZITHROMYCIN 250 MG PO TABS: ORAL_TABLET | ORAL | 0 refills | Status: DC

## 2017-04-17 NOTE — Progress Notes (Signed)
Patient: Ivan Salazar Male    DOB: 1932-02-02   83 y.o.   MRN: [id number] Visit Date: 04/17/2017  Today's Provider: Lelon Huh, MD   Chief Complaint  Patient presents with  . Cough    x 10 days   Subjective:    Cough  This is a new problem. Episode onset: 10 days. The problem has been unchanged. The cough is productive of sputum. Associated symptoms include ear congestion, postnasal drip and rhinorrhea. Pertinent negatives include no chest pain, chills, ear pain, fever, headaches, nasal congestion, sore throat, shortness of breath, sweats or wheezing. Treatments tried: Cherritussin and Mucinex DM. The treatment provided no relief.   Has also had a lot of clear nasal drainage and started using fluticasone nasal spray a few days ago.     Allergies  Allergen Reactions  . Celebrex [Celecoxib]     [place] taste in mouth  . Mirtazapine     Bad taste in mouth. Refuses to take again  . Prednisone      Current Outpatient Medications:  .  atorvastatin (LIPITOR) 80 MG tablet, TAKE 1 TABLET DAILY, Disp: 90 tablet, Rfl: 4 .  clopidogrel (PLAVIX) 75 MG tablet, TAKE 1 TABLET BY MOUTH DAILY, Disp: 90 tablet, Rfl: 4 .  enalapril (VASOTEC) 5 MG tablet, TAKE ONE TABLET BY MOUTH DAILY, Disp: 90 tablet, Rfl: 4 .  fluticasone (FLONASE) 50 MCG/ACT nasal spray, PLACE 2 SPRAYS INTO BOTH NOSTRILS DAILY. (Patient taking differently: Place 2 sprays into both nostrils daily. ), Disp: 17 g, Rfl: 5 .  guaiFENesin-codeine (CHERATUSSIN AC) 100-10 MG/5ML syrup, TAKE 1-2 TEASPOONS EVERY 6 HOURS AS NEEDED FOR COUGH, Disp: 180 mL, Rfl: 4 .  HYDROcodone-acetaminophen (NORCO) 7.5-325 MG tablet, 1 tablet every 4-6 hours as needed, Disp: 180 tablet, Rfl: 0 .  hydrocortisone 2.5 % cream, APPLY TO AFFECTED AREA(S) TWO TIMES A DAY AS NEEDED, Disp: 30 g, Rfl: 5 .  latanoprost (XALATAN) 0.005 % ophthalmic solution, Place 1 drop into both eyes at bedtime. , Disp: , Rfl:  .  metoprolol tartrate (LOPRESSOR) 25 MG  tablet, TAKE ONE TABLET BY MOUTH TWO TIMES A DAY, Disp: 180 tablet, Rfl: 4 .  nitroGLYCERIN (NITROSTAT) 0.4 MG SL tablet, Place 1 tablet (0.4 mg total) under the tongue every 5 (five) minutes as needed for chest pain., Disp: 20 tablet, Rfl: 1 .  ranitidine (ZANTAC) 300 MG tablet, TAKE 1 TABLET TWICE DAILY, Disp: 180 tablet, Rfl: 3 .  testosterone cypionate (DEPOTESTOSTERONE CYPIONATE) 200 MG/ML injection, Inject 1 mL (200 mg total) into the muscle every 28 (twenty-eight) days., Disp: 10 mL, Rfl: 0 .  zolpidem (AMBIEN) 10 MG tablet, TAKE ONE-HALF TO ONE TABLET BY MOUTH EVERY NIGHT AT BEDTIME, Disp: 30 tablet, Rfl: 3  Review of Systems  Constitutional: Negative for appetite change, chills and fever.  HENT: Positive for congestion, postnasal drip and rhinorrhea. Negative for ear pain and sore throat.   Respiratory: Positive for cough (sometimes procutive with yellow sputum). Negative for chest tightness, shortness of breath and wheezing.   Cardiovascular: Negative for chest pain and palpitations.  Gastrointestinal: Negative for abdominal pain, nausea and vomiting.  Neurological: Negative for headaches.    Social History   Tobacco Use  . Smoking status: Former Smoker    Packs/day: 0.50    Years: 20.00    Pack years: 10.00    Last attempt to quit: 05/01/1977    Years since quitting: 39.9  . Smokeless tobacco: Never Used  Substance  Use Topics  . Alcohol use: Yes    Alcohol/week: 8.4 oz    Types: 14 Shots of liquor per week    Comment: moderate; 2 drinks daily   Objective:   BP (!) 142/60 (BP Location: Right Arm, Patient Position: Sitting, Cuff Size: Normal)   Pulse (!) 52   Temp 97.6 F (36.4 C) (Oral)   Resp 18   Wt 152 lb (68.9 kg)   SpO2 98% Comment: room air  BMI 21.81 kg/m  There were no vitals filed for this visit.   Physical Exam  General Appearance:    Alert, cooperative, no distress  HENT:   bilateral TM normal without fluid or infection, neck without nodes, sinuses  nontender and nasal mucosa pale and congested  Eyes:    PERRL, conjunctiva/corneas clear, EOM's intact       Lungs:     Occasional expiratory wheeze, respirations unlabored  Heart:    Regular rate and rhythm  Neurologic:   Awake, alert, oriented x 3. No apparent focal neurological           defect.           Assessment & Plan:     1. Bronchitis  - azithromycin (ZITHROMAX) 250 MG tablet; 2 by mouth today, then 1 daily for 4 days  Dispense: 6 tablet; Refill: 0  Call if symptoms change or if not rapidly improving.          Lelon Huh, MD  [facility]

## 2017-04-19 ENCOUNTER — Ambulatory Visit: Payer: Medicare Other | Admitting: Family Medicine

## 2017-04-27 ENCOUNTER — Ambulatory Visit (INDEPENDENT_AMBULATORY_CARE_PROVIDER_SITE_OTHER): Payer: Medicare Other

## 2017-04-27 DIAGNOSIS — E291 Testicular hypofunction: Secondary | ICD-10-CM

## 2017-04-27 MED ORDER — TESTOSTERONE CYPIONATE 200 MG/ML IM SOLN
200.0000 mg | Freq: Once | INTRAMUSCULAR | Status: AC
  Administered 2017-04-27: 200 mg via INTRAMUSCULAR

## 2017-04-27 NOTE — Progress Notes (Signed)
Testosterone IM Injection  Due to Hypogonadism patient is present today for a Testosterone Injection.  Medication: Testosterone Cypionate Dose: 0.75mL Location: left upper outer buttocks Lot: [id number] Exp:10/2017  Patient tolerated well, no complications were noted  Preformed by: Toniann Fail, LPN   Follow up: 4 weeks for injection and 6 weeks for labs.

## 2017-05-11 ENCOUNTER — Ambulatory Visit (INDEPENDENT_AMBULATORY_CARE_PROVIDER_SITE_OTHER): Payer: Medicare Other | Admitting: Family Medicine

## 2017-05-11 ENCOUNTER — Encounter: Payer: Self-pay | Admitting: Family Medicine

## 2017-05-11 VITALS — BP 108/52 | HR 48 | Temp 97.8°F | Resp 16 | Wt 159.0 lb

## 2017-05-11 DIAGNOSIS — Z23 Encounter for immunization: Secondary | ICD-10-CM | POA: Diagnosis not present

## 2017-05-11 DIAGNOSIS — M542 Cervicalgia: Secondary | ICD-10-CM

## 2017-05-11 DIAGNOSIS — R1310 Dysphagia, unspecified: Secondary | ICD-10-CM

## 2017-05-11 NOTE — Patient Instructions (Addendum)
   The CDC recommends two doses of Shingrix (the shingles vaccine) separated by 2 to 6 months for adults age 82 years and older. I recommend checking with your pharmacist regarding coverage for this vaccine.

## 2017-05-11 NOTE — Progress Notes (Signed)
Patient: Ivan Salazar Male    DOB: 01-11-1932   84 y.o.   MRN: [id number] Visit Date: 05/11/2017  Today's Provider: Lelon Huh, MD   Chief Complaint  Patient presents with  . Follow-up  . Neck Pain   Subjective:   Patient saw [facility]enzie for AWV on 04/05/2017.  HPI Neck Pain: Patient was last seen for neck pain and osteoarthritis of cervical spine 6 months ago. During that visit imaging was reviewed and patient was advised toapply ice to the back of his neck 2 times a day or as needed. Today patient comes in reporting that his neck pain is still bothering him. He has tried applying ice to his neck and symptoms are unchanged. Pain is more on the right side of his neck. Pain occurs in the beginning of the day.  States it hasn't gotten any better or worse.   Trouble swallowing He has history of Schatzki's ring and reports he frequently has sensation of food getting stuck after swallowing. His last GI follow up was in May and subseuqnt UGI study revealed prominent B ring in esophagus. EGD was deferred since he was having some improvement with recommended swallowing precautions, but since then he feels it has been more of a problem.   Allergies  Allergen Reactions  . Celebrex [Celecoxib]     Noxius taste in mouth  . Mirtazapine     Bad taste in mouth. Refuses to take again  . Prednisone      Current Outpatient Medications:  .  atorvastatin (LIPITOR) 80 MG tablet, TAKE 1 TABLET DAILY, Disp: 90 tablet, Rfl: 4 .  clopidogrel (PLAVIX) 75 MG tablet, TAKE 1 TABLET BY MOUTH DAILY, Disp: 90 tablet, Rfl: 4 .  enalapril (VASOTEC) 5 MG tablet, TAKE ONE TABLET BY MOUTH DAILY, Disp: 90 tablet, Rfl: 4 .  fluticasone (FLONASE) 50 MCG/ACT nasal spray, PLACE 2 SPRAYS INTO BOTH NOSTRILS DAILY. (Patient taking differently: Place 2 sprays into both nostrils daily. ), Disp: 17 g, Rfl: 5 .  guaiFENesin-codeine (CHERATUSSIN AC) 100-10 MG/5ML syrup, TAKE 1-2 TEASPOONS EVERY 6 HOURS AS NEEDED FOR  COUGH, Disp: 180 mL, Rfl: 4 .  HYDROcodone-acetaminophen (NORCO) 7.5-325 MG tablet, 1 tablet every 4-6 hours as needed, Disp: 180 tablet, Rfl: 0 .  hydrocortisone 2.5 % cream, APPLY TO AFFECTED AREA(S) TWO TIMES A DAY AS NEEDED, Disp: 30 g, Rfl: 5 .  latanoprost (XALATAN) 0.005 % ophthalmic solution, Place 1 drop into both eyes at bedtime. , Disp: , Rfl:  .  metoprolol tartrate (LOPRESSOR) 25 MG tablet, TAKE ONE TABLET BY MOUTH TWO TIMES A DAY, Disp: 180 tablet, Rfl: 4 .  nitroGLYCERIN (NITROSTAT) 0.4 MG SL tablet, Place 1 tablet (0.4 mg total) under the tongue every 5 (five) minutes as needed for chest pain., Disp: 20 tablet, Rfl: 1 .  ranitidine (ZANTAC) 300 MG tablet, TAKE 1 TABLET TWICE DAILY, Disp: 180 tablet, Rfl: 3 .  testosterone cypionate (DEPOTESTOSTERONE CYPIONATE) 200 MG/ML injection, Inject 1 mL (200 mg total) into the muscle every 28 (twenty-eight) days., Disp: 10 mL, Rfl: 0 .  zolpidem (AMBIEN) 10 MG tablet, TAKE ONE-HALF TO ONE TABLET BY MOUTH EVERY NIGHT AT BEDTIME, Disp: 30 tablet, Rfl: 3  Review of Systems  Constitutional: Negative for appetite change, chills and fever.  Respiratory: Negative for chest tightness, shortness of breath and wheezing.   Cardiovascular: Negative for chest pain and palpitations.  Gastrointestinal: Negative for abdominal pain, nausea and vomiting.  Musculoskeletal: Positive for neck  pain and neck stiffness.    Social History   Tobacco Use  . Smoking status: Former Smoker    Packs/day: 0.50    Years: 20.00    Pack years: 10.00    Last attempt to quit: 05/01/1977    Years since quitting: 40.0  . Smokeless tobacco: Never Used  Substance Use Topics  . Alcohol use: Yes    Alcohol/week: 8.4 oz    Types: 14 Shots of liquor per week    Comment: moderate; 2 drinks daily   Objective:   BP (!) 108/52 (BP Location: Right Arm, Patient Position: Sitting, Cuff Size: Normal)   Pulse (!) 48   Temp 97.8 F (36.6 C) (Oral)   Resp 16   Wt 159 lb (72.1  kg)   SpO2 98% Comment: room air  BMI 22.81 kg/m  There were no vitals filed for this visit.   Physical Exam  General appearance: alert, well developed, well nourished, cooperative and in no distress Head: Normocephalic, without obvious abnormality, atraumatic Respiratory: Respirations even and unlabored, normal respiratory rate MS: Slight tenderness over mid cervical spine. No gross deformities.      Assessment & Plan:     1. Neck pain Stable. Continue applying ice packs prn, recommend he try to use then 2-3 times a day.   2. Dysphagia, unspecified type Known h/o Schatski ring. Progressive sx, recommended he follow up with GI  3. CAD Reminded that he is due for follow up with cardiology at [facility] and to contact them to schedule.   4. Need for pneumococcal vaccination  - Pneumococcal conjugate vaccine 13-valent       Lelon Huh, MD  [facility]

## 2017-05-11 NOTE — Telephone Encounter (Signed)
duplicate

## 2017-05-17 DIAGNOSIS — L820 Inflamed seborrheic keratosis: Secondary | ICD-10-CM | POA: Diagnosis not present

## 2017-05-17 DIAGNOSIS — D692 Other nonthrombocytopenic purpura: Secondary | ICD-10-CM | POA: Diagnosis not present

## 2017-05-17 DIAGNOSIS — L812 Freckles: Secondary | ICD-10-CM | POA: Diagnosis not present

## 2017-05-17 DIAGNOSIS — D1801 Hemangioma of skin and subcutaneous tissue: Secondary | ICD-10-CM | POA: Diagnosis not present

## 2017-05-17 DIAGNOSIS — L578 Other skin changes due to chronic exposure to nonionizing radiation: Secondary | ICD-10-CM | POA: Diagnosis not present

## 2017-05-17 DIAGNOSIS — L821 Other seborrheic keratosis: Secondary | ICD-10-CM | POA: Diagnosis not present

## 2017-05-17 DIAGNOSIS — D229 Melanocytic nevi, unspecified: Secondary | ICD-10-CM | POA: Diagnosis not present

## 2017-05-17 DIAGNOSIS — Z1283 Encounter for screening for malignant neoplasm of skin: Secondary | ICD-10-CM | POA: Diagnosis not present

## 2017-05-19 ENCOUNTER — Ambulatory Visit (INDEPENDENT_AMBULATORY_CARE_PROVIDER_SITE_OTHER): Payer: Medicare Other | Admitting: Family Medicine

## 2017-05-19 ENCOUNTER — Encounter: Payer: Self-pay | Admitting: Family Medicine

## 2017-05-19 VITALS — BP 110/58 | HR 50 | Temp 97.9°F | Resp 16 | Wt 156.0 lb

## 2017-05-19 DIAGNOSIS — R0609 Other forms of dyspnea: Secondary | ICD-10-CM | POA: Diagnosis not present

## 2017-05-19 DIAGNOSIS — I2510 Atherosclerotic heart disease of native coronary artery without angina pectoris: Secondary | ICD-10-CM | POA: Diagnosis not present

## 2017-05-19 DIAGNOSIS — R609 Edema, unspecified: Secondary | ICD-10-CM

## 2017-05-19 MED ORDER — FUROSEMIDE 20 MG PO TABS: 20.0000 mg | ORAL_TABLET | Freq: Every day | ORAL | 0 refills | Status: DC | PRN

## 2017-05-19 NOTE — Progress Notes (Signed)
Patient: Ivan Salazar Male    DOB: [birth year]   84 y.o.   MRN: [id number] Visit Date: 05/19/2017  Today's Provider: Lelon Huh, MD   Chief Complaint  Patient presents with  . Leg Swelling   Subjective:    HPI Patient comes in today c/o swelling in his left leg. He reports that the swelling radiates to his left foot. He has had swelling for the last 2 days. He also mentions having occasional shortness of breath with exertion. States symptoms remind him of previous episodes of angina, although he is not having any chest pains. He thinks he needs a cardiac cath, but prefers to have it done locally. He has been followed by cardiology in [facility], but would does not want to travel for procedures.   Wt Readings from Last 5 Encounters:  05/19/17 156 lb (70.8 kg)  05/11/17 159 lb (72.1 kg)  04/17/17 152 lb (68.9 kg)  04/05/17 152 lb 12.8 oz (69.3 kg)  02/23/17 148 lb (67.1 kg)     Patient Active Problem List   Diagnosis Date Noted  . OA (osteoarthritis) of neck 11/01/2016  . Eustachian tube dysfunction 09/03/2015  . Schatzki's ring 09/03/2015  . Adenomatous polyp of colon 09/03/2015  . Hypogonadism in male 03/09/2015  . BPH with obstruction/lower urinary tract symptoms 03/09/2015  . Loss of weight 03/05/2015  . Actinic keratoses 10/22/2014  . DDD (degenerative disc disease), lumbosacral 10/22/2014  . Acid reflux 10/22/2014  . Testicular hypofunction 06/27/2012  . Bursitis of hip 01/18/2011  . Family history of colonic polyps 12/24/2007  . CAD in native artery 08/16/2006  . H/O coronary artery bypass surgery 08/16/2006  . Anemia, iron deficiency 09/23/2005  . Cardiac enlargement 07/20/2005  . HLD (hyperlipidemia) 05/02/1998  . Insomnia, persistent 05/02/1998  . Essential (primary) hypertension 05/02/1998        Allergies  Allergen Reactions  . Celebrex [Celecoxib]     Noxius taste in mouth  . Mirtazapine     Bad taste in mouth. Refuses to take again  .  Prednisone      Current Outpatient Medications:  .  atorvastatin (LIPITOR) 80 MG tablet, TAKE 1 TABLET DAILY, Disp: 90 tablet, Rfl: 4 .  clopidogrel (PLAVIX) 75 MG tablet, TAKE 1 TABLET BY MOUTH DAILY, Disp: 90 tablet, Rfl: 4 .  enalapril (VASOTEC) 5 MG tablet, TAKE ONE TABLET BY MOUTH DAILY, Disp: 90 tablet, Rfl: 4 .  fluticasone (FLONASE) 50 MCG/ACT nasal spray, PLACE 2 SPRAYS INTO BOTH NOSTRILS DAILY., Disp: 17 g, Rfl: 5 .  HYDROcodone-acetaminophen (NORCO) 7.5-325 MG tablet, 1 tablet every 4-6 hours as needed, Disp: 180 tablet, Rfl: 0 .  hydrocortisone 2.5 % cream, APPLY TO AFFECTED AREA(S) TWO TIMES A DAY AS NEEDED, Disp: 30 g, Rfl: 5 .  latanoprost (XALATAN) 0.005 % ophthalmic solution, Place 1 drop into both eyes at bedtime. , Disp: , Rfl:  .  metoprolol tartrate (LOPRESSOR) 25 MG tablet, TAKE ONE TABLET BY MOUTH TWO TIMES A DAY, Disp: 180 tablet, Rfl: 4 .  nitroGLYCERIN (NITROSTAT) 0.4 MG SL tablet, Place 1 tablet (0.4 mg total) under the tongue every 5 (five) minutes as needed for chest pain., Disp: 20 tablet, Rfl: 1 .  ranitidine (ZANTAC) 300 MG tablet, TAKE 1 TABLET TWICE DAILY, Disp: 180 tablet, Rfl: 3 .  testosterone cypionate (DEPOTESTOSTERONE CYPIONATE) 200 MG/ML injection, Inject 1 mL (200 mg total) into the muscle every 28 (twenty-eight) days., Disp: 10 mL, Rfl: 0 .  zolpidem (AMBIEN) 10 MG tablet, TAKE ONE-HALF TO ONE TABLET BY MOUTH EVERY NIGHT AT BEDTIME, Disp: 30 tablet, Rfl: 3 .  guaiFENesin-codeine (CHERATUSSIN AC) 100-10 MG/5ML syrup, TAKE 1-2 TEASPOONS EVERY 6 HOURS AS NEEDED FOR COUGH (Patient not taking: Reported on 05/19/2017), Disp: 180 mL, Rfl: 4  Review of Systems  Constitutional: Negative for activity change.  Respiratory: Positive for shortness of breath. Negative for apnea, cough and chest tightness.   Cardiovascular: Positive for leg swelling. Negative for chest pain and palpitations.  Musculoskeletal: Positive for myalgias.  Skin: Positive for color change.    Neurological: Negative for tremors, speech difficulty, numbness and headaches.    Social History   Tobacco Use  . Smoking status: Former Smoker    Packs/day: 0.50    Years: 20.00    Pack years: 10.00    Last attempt to quit: 05/01/1977    Years since quitting: 40.0  . Smokeless tobacco: Never Used  Substance Use Topics  . Alcohol use: Yes    Alcohol/week: 8.4 oz    Types: 14 Shots of liquor per week    Comment: moderate; 2 drinks daily   Objective:   BP (!) 110/58 (BP Location: Left Arm, Patient Position: Sitting, Cuff Size: Normal)   Pulse (!) 50   Temp 97.9 F (36.6 C)   Resp 16   Wt 156 lb (70.8 kg)   SpO2 98%   BMI 22.38 kg/m  Vitals:   05/19/17 0958  BP: (!) 110/58  Pulse: (!) 50  Resp: 16  Temp: 97.9 F (36.6 C)  SpO2: 98%  Weight: 156 lb (70.8 kg)     Physical Exam   General Appearance:    Alert, cooperative, no distress  Eyes:    PERRL, conjunctiva/corneas clear, EOM's intact       Lungs:     Clear to auscultation bilaterally, respirations unlabored  Heart:    Bradycardic, Regular rhythm  Neurologic:   Awake, alert, oriented x 3. No apparent focal neurological           defect.   Ext:   1+ bipedal pitting edema.    EKG: No changes compared to EKG of 02/23/2017    Assessment & Plan:     1. Dyspnea on exertion Getting slightly worse over the last few months. Is noted to have gained 8 pounds since October. Check labs and start furosemide 20 mg prn - EKG 12-Lead - Brain natriuretic peptide - Troponin I - CBC - Ambulatory referral to Cardiology  2. Edema, unspecified type   3. CAD in native artery Long history of stable CAD with stable xray today, is on clopidogrel, ACEI, statin and betablocker which will be continued. He would prefer to have any cardiac procedures done locally instead of travelling to [facility], but advised I'm not sure if a cath will be necessary. Will refer to local cardiologist       Lelon Huh, MD  [facility]

## 2017-05-20 LAB — CBC
HEMATOCRIT: 35.7 % — AB (ref 37.5–51.0)
Hemoglobin: 11.6 g/dL — ABNORMAL LOW (ref 13.0–17.7)
MCH: 30.9 pg (ref 26.6–33.0)
MCHC: 32.5 g/dL (ref 31.5–35.7)
MCV: 95 fL (ref 79–97)
Platelets: 166 10*3/uL (ref 150–379)
RBC: 3.76 x10E6/uL — ABNORMAL LOW (ref 4.14–5.80)
RDW: 14.4 % (ref 12.3–15.4)
WBC: 4.8 10*3/uL (ref 3.4–10.8)

## 2017-05-20 LAB — BRAIN NATRIURETIC PEPTIDE: BNP: 585.6 pg/mL — ABNORMAL HIGH (ref 0.0–100.0)

## 2017-05-20 LAB — TROPONIN I: Troponin I: 0.05 ng/mL (ref 0.00–0.04)

## 2017-05-22 ENCOUNTER — Telehealth: Payer: Self-pay | Admitting: Family Medicine

## 2017-05-22 NOTE — Telephone Encounter (Signed)
Patient wants to know if Furosemide is to "clean you out".   He said he feels like its "emptying" him. He realizes it is a diuretic but also wanted to know if people use it for weight loss.

## 2017-05-22 NOTE — Telephone Encounter (Signed)
It just gets rid of excess fluid in the body. Most peoples weight will drop 3-5 pounds by getting rid of the excess of fluid.

## 2017-05-22 NOTE — Telephone Encounter (Signed)
Please advise 

## 2017-05-23 ENCOUNTER — Other Ambulatory Visit: Payer: Self-pay | Admitting: Family Medicine

## 2017-05-23 DIAGNOSIS — I2510 Atherosclerotic heart disease of native coronary artery without angina pectoris: Secondary | ICD-10-CM | POA: Diagnosis not present

## 2017-05-23 DIAGNOSIS — Z951 Presence of aortocoronary bypass graft: Secondary | ICD-10-CM | POA: Diagnosis not present

## 2017-05-23 DIAGNOSIS — R609 Edema, unspecified: Secondary | ICD-10-CM | POA: Diagnosis not present

## 2017-05-23 DIAGNOSIS — R0602 Shortness of breath: Secondary | ICD-10-CM | POA: Diagnosis not present

## 2017-05-23 NOTE — Telephone Encounter (Signed)
Tried calling patient. Left message to call back. 

## 2017-05-24 ENCOUNTER — Other Ambulatory Visit: Payer: Self-pay

## 2017-05-24 NOTE — Telephone Encounter (Signed)
Patient called back wanting to know if there was a lower mg. of furosemide that he can take.

## 2017-05-24 NOTE — Telephone Encounter (Signed)
Patient called back and was advised as below. Patient states that he has been taking Furosemide for less than 1 week and has dropped 10 pounds. His starting weight was 159 and he is now weighing 149. Patient states this is alarming to him. He says that he has also been having a large bowel movement once a day. He wants to know your opinion of what you think should be done. Patient plans to hold off on taking today's dose until he hears from.

## 2017-05-24 NOTE — Telephone Encounter (Signed)
No, but he can use a pill cutter to cut the 20mg  tablets in half if he feels like the medication is too strong.

## 2017-05-24 NOTE — Telephone Encounter (Signed)
LMTCB ED 

## 2017-05-25 ENCOUNTER — Ambulatory Visit (INDEPENDENT_AMBULATORY_CARE_PROVIDER_SITE_OTHER): Payer: Medicare Other

## 2017-05-25 DIAGNOSIS — E291 Testicular hypofunction: Secondary | ICD-10-CM | POA: Diagnosis not present

## 2017-05-25 MED ORDER — TESTOSTERONE CYPIONATE 200 MG/ML IM SOLN
200.0000 mg | Freq: Once | INTRAMUSCULAR | Status: AC
  Administered 2017-05-25: 200 mg via INTRAMUSCULAR

## 2017-05-25 NOTE — Telephone Encounter (Signed)
This is Dr Maralyn Sago patient not Dr Alben Spittle. Jiles Garter was on triage the day she left a message.  Please call patient. Thank Edrick Kins, RMA

## 2017-05-25 NOTE — Telephone Encounter (Signed)
He just needs to follow up with his cardiologist about this.

## 2017-05-25 NOTE — Telephone Encounter (Signed)
Patient advised and states he still wants to follow up with you about the swelling in his legs. Patient insist on making an appointment with you. He says we would follow up with Cardiologist as well.  Appointment was scheduled for Tuesday 05/30/2017 at 4:15pm.

## 2017-05-25 NOTE — Telephone Encounter (Signed)
Pt called back and requested that Ivan Salazar try to reach him after 930 am this morning. Please advise. Thanks TNP

## 2017-05-25 NOTE — Telephone Encounter (Signed)
Pt is returning CMA call. I spoke with Roshena and she stated she is currently finishing with another pt that is in the office and to let patient know that she will return his call in the 20 minutes or so. Pt was advised and voiced understanding. Thanks TNP

## 2017-05-25 NOTE — Telephone Encounter (Signed)
Patient is returning [place] call.   Please call after 9:00 a.m.  today

## 2017-05-25 NOTE — Progress Notes (Signed)
Testosterone IM Injection  Due to Hypogonadism patient is present today for a Testosterone Injection.  Medication: Testosterone Cypionate Dose: 0.23mL Location: right upper outer buttocks Lot: [id number] Exp:10/2017  Patient tolerated well, no complications noted.  Preformed by: Toniann Fail, LPN

## 2017-05-25 NOTE — Telephone Encounter (Signed)
Patient advised as below and agreed with Dr. Maralyn Sago recommendation. Patient wants to know when he needs to follow up on this medication/ swelling/ shortness of breath? Patient says its ok to leave a detailed message on his voice message system if he doesn't answer the phone. Call back is [phone].

## 2017-05-30 ENCOUNTER — Ambulatory Visit: Payer: Medicare Other | Admitting: Family Medicine

## 2017-05-30 DIAGNOSIS — H401131 Primary open-angle glaucoma, bilateral, mild stage: Secondary | ICD-10-CM | POA: Diagnosis not present

## 2017-05-31 ENCOUNTER — Other Ambulatory Visit: Payer: Self-pay | Admitting: Family Medicine

## 2017-05-31 DIAGNOSIS — R053 Chronic cough: Secondary | ICD-10-CM

## 2017-05-31 DIAGNOSIS — R05 Cough: Secondary | ICD-10-CM

## 2017-06-02 ENCOUNTER — Ambulatory Visit (INDEPENDENT_AMBULATORY_CARE_PROVIDER_SITE_OTHER): Payer: Medicare Other | Admitting: Family Medicine

## 2017-06-02 ENCOUNTER — Other Ambulatory Visit: Payer: Self-pay | Admitting: Family Medicine

## 2017-06-02 ENCOUNTER — Encounter: Payer: Self-pay | Admitting: Family Medicine

## 2017-06-02 VITALS — BP 112/58 | HR 56 | Temp 98.0°F | Resp 16 | Wt 157.0 lb

## 2017-06-02 DIAGNOSIS — R0609 Other forms of dyspnea: Secondary | ICD-10-CM | POA: Diagnosis not present

## 2017-06-02 DIAGNOSIS — I2510 Atherosclerotic heart disease of native coronary artery without angina pectoris: Secondary | ICD-10-CM

## 2017-06-02 DIAGNOSIS — R609 Edema, unspecified: Secondary | ICD-10-CM

## 2017-06-02 NOTE — Progress Notes (Signed)
Patient: Ivan Salazar Male    DOB: 1931-07-27   84 y.o.   MRN: [id number] Visit Date: 06/02/2017  Today's Provider: Lelon Huh, MD   Chief Complaint  Patient presents with  . swelling in lower legs   Subjective:    HPI Patient comes in today follow welling in his lower legs and dyspnea from 05-19-2017 and found to have elevated BNP. He was started on 20mg  furosemide, but he decreased to 1/2 tablet since the full tablet seemed to strong to him. He states his breathing has significantly improved and can walk 1/2 mile at [facility] without getting short of breath. Had initial visit with cardiology PA on 05-23-2017 and ordered echo and follow up with Dr. Saralyn Pilar on 06-05-2017.     Allergies  Allergen Reactions  . Celebrex [Celecoxib]     Noxius taste in mouth  . Mirtazapine     Bad taste in mouth. Refuses to take again  . Prednisone      Current Outpatient Medications:  .  atorvastatin (LIPITOR) 80 MG tablet, TAKE 1 TABLET DAILY, Disp: 90 tablet, Rfl: 4 .  clopidogrel (PLAVIX) 75 MG tablet, TAKE 1 TABLET BY MOUTH DAILY, Disp: 90 tablet, Rfl: 4 .  enalapril (VASOTEC) 5 MG tablet, TAKE ONE TABLET BY MOUTH DAILY, Disp: 90 tablet, Rfl: 4 .  fluticasone (FLONASE) 50 MCG/ACT nasal spray, PLACE 2 SPRAYS INTO BOTH NOSTRILS DAILY., Disp: 17 g, Rfl: 5 .  furosemide (LASIX) 20 MG tablet, Take 1 tablet (20 mg total) by mouth daily as needed (swelling and shortness of breat)., Disp: 30 tablet, Rfl: 0 .  guaiFENesin-codeine (CHERATUSSIN AC) 100-10 MG/5ML syrup, TAKE 1-2 TEASPOONS EVERY 6 HOURS AS NEEDED FOR COUGH, Disp: 180 mL, Rfl: 3 .  HYDROcodone-acetaminophen (NORCO) 7.5-325 MG tablet, 1 tablet every 4-6 hours as needed, Disp: 180 tablet, Rfl: 0 .  hydrocortisone 2.5 % cream, APPLY TO AFFECTED AREA(S) TWO TIMES A DAY AS NEEDED, Disp: 30 g, Rfl: 5 .  latanoprost (XALATAN) 0.005 % ophthalmic solution, Place 1 drop into both eyes at bedtime. , Disp: , Rfl:  .  metoprolol tartrate (LOPRESSOR) 25  MG tablet, TAKE ONE TABLET BY MOUTH TWO TIMES A DAY, Disp: 180 tablet, Rfl: 4 .  nitroGLYCERIN (NITROSTAT) 0.4 MG SL tablet, Place 1 tablet (0.4 mg total) under the tongue every 5 (five) minutes as needed for chest pain., Disp: 20 tablet, Rfl: 1 .  ranitidine (ZANTAC) 300 MG tablet, TAKE 1 TABLET TWICE DAILY, Disp: 180 tablet, Rfl: 3 .  testosterone cypionate (DEPOTESTOSTERONE CYPIONATE) 200 MG/ML injection, Inject 1 mL (200 mg total) into the muscle every 28 (twenty-eight) days., Disp: 10 mL, Rfl: 0 .  zolpidem (AMBIEN) 10 MG tablet, TAKE ONE-HALF TO ONE TABLET BY MOUTH EVERY NIGHT AT BEDTIME, Disp: 30 tablet, Rfl: 3  Review of Systems  Constitutional: Negative for appetite change, chills and fever.  Respiratory: Negative for chest tightness, shortness of breath and wheezing.   Cardiovascular: Positive for leg swelling. Negative for chest pain and palpitations.  Gastrointestinal: Negative for abdominal pain, nausea and vomiting.    Social History   Tobacco Use  . Smoking status: Former Smoker    Packs/day: 0.50    Years: 20.00    Pack years: 10.00    Last attempt to quit: 05/01/1977    Years since quitting: 40.1  . Smokeless tobacco: Never Used  Substance Use Topics  . Alcohol use: Yes    Alcohol/week: 8.4 oz  Types: 14 Shots of liquor per week    Comment: moderate; 2 drinks daily   Objective:   BP (!) 112/58 (BP Location: Left Arm, Patient Position: Sitting, Cuff Size: Normal)   Pulse (!) 56   Temp 98 F (36.7 C)   Resp 16   Wt 157 lb (71.2 kg)   BMI 22.53 kg/m  Vitals:   06/02/17 1446  BP: (!) 112/58  Pulse: (!) 56  Resp: 16  Temp: 98 F (36.7 C)  Weight: 157 lb (71.2 kg)     Physical Exam   General Appearance:    Alert, cooperative, no distress  Eyes:    PERRL, conjunctiva/corneas clear, EOM's intact       Lungs:     Clear to auscultation bilaterally, respirations unlabored  Heart:    Regular rate and rhythm. Trace bipedal edema.   Neurologic:   Awake,  alert, oriented x 3. No apparent focal neurological           defect.           Assessment & Plan:     1. Dyspnea on exertion Resolved since starting furosemide. Continue current medications.    2. Edema, unspecified type Greatly improved.   3. CAD in native artery Asymptomatic. Compliant with medication.  Continue aggressive risk factor modification.  Follow up cardiology next week as scheduled.        Lelon Huh, MD  [facility]

## 2017-06-02 NOTE — Telephone Encounter (Signed)
Pharmacy requesting refills. Thanks!  

## 2017-06-04 MED ORDER — FUROSEMIDE 20 MG PO TABS: 10.0000 mg | ORAL_TABLET | Freq: Every day | ORAL | 1 refills | Status: DC | PRN

## 2017-06-05 DIAGNOSIS — R609 Edema, unspecified: Secondary | ICD-10-CM | POA: Diagnosis not present

## 2017-06-05 DIAGNOSIS — Z951 Presence of aortocoronary bypass graft: Secondary | ICD-10-CM | POA: Diagnosis not present

## 2017-06-05 DIAGNOSIS — I2510 Atherosclerotic heart disease of native coronary artery without angina pectoris: Secondary | ICD-10-CM | POA: Diagnosis not present

## 2017-06-05 DIAGNOSIS — R0602 Shortness of breath: Secondary | ICD-10-CM | POA: Diagnosis not present

## 2017-06-07 ENCOUNTER — Other Ambulatory Visit: Payer: Self-pay

## 2017-06-07 DIAGNOSIS — N401 Enlarged prostate with lower urinary tract symptoms: Secondary | ICD-10-CM

## 2017-06-07 DIAGNOSIS — E291 Testicular hypofunction: Secondary | ICD-10-CM

## 2017-06-07 NOTE — Telephone Encounter (Signed)
Visit complete.

## 2017-06-08 ENCOUNTER — Other Ambulatory Visit: Payer: Medicare Other

## 2017-06-08 DIAGNOSIS — N401 Enlarged prostate with lower urinary tract symptoms: Secondary | ICD-10-CM | POA: Diagnosis not present

## 2017-06-08 DIAGNOSIS — E291 Testicular hypofunction: Secondary | ICD-10-CM | POA: Diagnosis not present

## 2017-06-09 LAB — HEMATOCRIT: Hematocrit: 38.6 % (ref 37.5–51.0)

## 2017-06-09 LAB — HEMOGLOBIN: Hemoglobin: 12.1 g/dL — ABNORMAL LOW (ref 13.0–17.7)

## 2017-06-09 LAB — TESTOSTERONE: Testosterone: 364 ng/dL (ref 264–916)

## 2017-06-09 LAB — PSA: PROSTATE SPECIFIC AG, SERUM: 0.4 ng/mL (ref 0.0–4.0)

## 2017-06-12 ENCOUNTER — Telehealth: Payer: Self-pay | Admitting: Radiology

## 2017-06-12 DIAGNOSIS — I2510 Atherosclerotic heart disease of native coronary artery without angina pectoris: Secondary | ICD-10-CM | POA: Diagnosis not present

## 2017-06-12 DIAGNOSIS — R0602 Shortness of breath: Secondary | ICD-10-CM | POA: Diagnosis not present

## 2017-06-12 DIAGNOSIS — Z951 Presence of aortocoronary bypass graft: Secondary | ICD-10-CM | POA: Diagnosis not present

## 2017-06-12 NOTE — Telephone Encounter (Signed)
LMOM to cb to schedule office visit with Larene Beach.

## 2017-06-12 NOTE — Telephone Encounter (Signed)
-----   Message from Royanne Foots, [place] sent at 06/12/2017  3:51 PM EST -----   ----- Message ----- From: Royanne Foots, CMA Sent: 06/12/2017   3:38 PM To: [facility]    ----- Message ----- From: Nori Riis, PA-C Sent: 06/12/2017  10:25 AM To: [facility]  Patient needs an office visit.

## 2017-06-13 NOTE — Telephone Encounter (Signed)
Appt made.  Pt states he is having heart problems & is having tests done on 06/16/2017 at [facility]. He wanted Larene Beach to be aware.

## 2017-06-14 ENCOUNTER — Ambulatory Visit (INDEPENDENT_AMBULATORY_CARE_PROVIDER_SITE_OTHER): Payer: Medicare Other | Admitting: Family Medicine

## 2017-06-14 ENCOUNTER — Encounter: Payer: Self-pay | Admitting: Family Medicine

## 2017-06-14 ENCOUNTER — Other Ambulatory Visit: Payer: Self-pay | Admitting: Family Medicine

## 2017-06-14 VITALS — BP 150/64 | HR 44 | Temp 97.7°F | Resp 16 | Wt 157.0 lb

## 2017-06-14 DIAGNOSIS — M79605 Pain in left leg: Secondary | ICD-10-CM

## 2017-06-14 DIAGNOSIS — R609 Edema, unspecified: Secondary | ICD-10-CM

## 2017-06-14 DIAGNOSIS — E785 Hyperlipidemia, unspecified: Secondary | ICD-10-CM | POA: Diagnosis not present

## 2017-06-14 DIAGNOSIS — M5137 Other intervertebral disc degeneration, lumbosacral region: Secondary | ICD-10-CM | POA: Diagnosis not present

## 2017-06-14 DIAGNOSIS — I340 Nonrheumatic mitral (valve) insufficiency: Secondary | ICD-10-CM | POA: Diagnosis not present

## 2017-06-14 DIAGNOSIS — I2510 Atherosclerotic heart disease of native coronary artery without angina pectoris: Secondary | ICD-10-CM

## 2017-06-14 DIAGNOSIS — I10 Essential (primary) hypertension: Secondary | ICD-10-CM

## 2017-06-14 DIAGNOSIS — R0609 Other forms of dyspnea: Secondary | ICD-10-CM | POA: Diagnosis not present

## 2017-06-14 MED ORDER — HYDROCODONE-ACETAMINOPHEN 7.5-325 MG PO TABS: ORAL_TABLET | ORAL | 0 refills | Status: DC

## 2017-06-14 MED ORDER — METOPROLOL SUCCINATE ER 25 MG PO TB24: 25.0000 mg | ORAL_TABLET | Freq: Every day | ORAL | 1 refills | Status: DC

## 2017-06-14 NOTE — Patient Instructions (Signed)
   Start taking metoprolol succinate (extended release) 25mg  INSTEAD of metoprolol tartrate once a day

## 2017-06-14 NOTE — Progress Notes (Signed)
Patient: Ivan Salazar Male    DOB: 1931/05/14   84 y.o.   MRN: [id number] Visit Date: 06/14/2017  Today's Provider: Lelon Huh, MD   Chief Complaint  Patient presents with  . Leg Swelling   Subjective:    HPI Leg Swelling: Patient comes in for follow up evaluation of leg swelling. This has been a recurrent problem, Patient has been taking Furosemide 20mg , 1/2 tablet daily. He states this medications was working initially, but more recently has not been effective in controlling swelling. Patient states he noted swollen area side of left lower leg which is tender. Swelling of right lower leg is much better.   Wt Readings from Last 3 Encounters:  06/14/17 157 lb (71.2 kg)  06/02/17 157 lb (71.2 kg)  05/19/17 156 lb (70.8 kg)   He would also like to talk about his cardiac medications  He had visit with PA at [facility] cardiology who decrease metoprolol to 1/2 x25mg  BID due to bradycardia, but he has been taking one tablet QD since the pills are very small and difficult to cut. Home blood pressure and heart rates have been labile.   He also wants to talk about results of recent echocardiogram ordered by cardiology.     Allergies  Allergen Reactions  . Celebrex [Celecoxib]     Noxius taste in mouth  . Mirtazapine     Bad taste in mouth. Refuses to take again  . Prednisone      Current Outpatient Medications:  .  atorvastatin (LIPITOR) 80 MG tablet, TAKE 1 TABLET DAILY, Disp: 90 tablet, Rfl: 4 .  clopidogrel (PLAVIX) 75 MG tablet, TAKE 1 TABLET BY MOUTH DAILY, Disp: 90 tablet, Rfl: 4 .  enalapril (VASOTEC) 5 MG tablet, TAKE ONE TABLET BY MOUTH DAILY, Disp: 90 tablet, Rfl: 4 .  fluticasone (FLONASE) 50 MCG/ACT nasal spray, PLACE 2 SPRAYS INTO BOTH NOSTRILS DAILY., Disp: 17 g, Rfl: 5 .  furosemide (LASIX) 20 MG tablet, Take 0.5 tablets (10 mg total) by mouth daily as needed (swelling and shortness of breat)., Disp: 1 tablet, Rfl: 1 .  furosemide (LASIX) 20 MG tablet, Take 0.5  tablets (10 mg total) by mouth daily., Disp: 30 tablet, Rfl: 2 .  guaiFENesin-codeine (CHERATUSSIN AC) 100-10 MG/5ML syrup, TAKE 1-2 TEASPOONS EVERY 6 HOURS AS NEEDED FOR COUGH, Disp: 180 mL, Rfl: 3 .  HYDROcodone-acetaminophen (NORCO) 7.5-325 MG tablet, 1 tablet every 4-6 hours as needed, Disp: 180 tablet, Rfl: 0 .  hydrocortisone 2.5 % cream, APPLY TO AFFECTED AREA(S) TWO TIMES A DAY AS NEEDED, Disp: 30 g, Rfl: 5 .  latanoprost (XALATAN) 0.005 % ophthalmic solution, Place 1 drop into both eyes at bedtime. , Disp: , Rfl:  .  metoprolol tartrate (LOPRESSOR) 25 MG tablet, TAKE ONE TABLET BY MOUTH TWO TIMES A DAY (Patient taking differently: TAKE ONE TABLET BY ONCE A  DAY), Disp: 180 tablet, Rfl: 4 .  nitroGLYCERIN (NITROSTAT) 0.4 MG SL tablet, Place 1 tablet (0.4 mg total) under the tongue every 5 (five) minutes as needed for chest pain., Disp: 20 tablet, Rfl: 1 .  ranitidine (ZANTAC) 300 MG tablet, TAKE 1 TABLET TWICE DAILY, Disp: 180 tablet, Rfl: 3 .  testosterone cypionate (DEPOTESTOSTERONE CYPIONATE) 200 MG/ML injection, Inject 1 mL (200 mg total) into the muscle every 28 (twenty-eight) days., Disp: 10 mL, Rfl: 0 .  zolpidem (AMBIEN) 10 MG tablet, TAKE 1/2-1 TABLET EVERY NIGHT AT BEDTIME, Disp: 30 tablet, Rfl: 5  Review of Systems  Constitutional: Negative for appetite change, chills and fever.  Respiratory: Positive for shortness of breath. Negative for chest tightness and wheezing.   Cardiovascular: Positive for leg swelling. Negative for chest pain and palpitations.  Gastrointestinal: Negative for abdominal pain, nausea and vomiting.    Social History   Tobacco Use  . Smoking status: Former Smoker    Packs/day: 0.50    Years: 20.00    Pack years: 10.00    Last attempt to quit: 05/01/1977    Years since quitting: 40.1  . Smokeless tobacco: Never Used  Substance Use Topics  . Alcohol use: Yes    Alcohol/week: 8.4 oz    Types: 14 Shots of liquor per week    Comment: moderate; 2  drinks daily   Objective:   BP (!) 150/64 (BP Location: Right Arm, Cuff Size: Large)   Pulse (!) 44   Temp 97.7 F (36.5 C) (Oral)   Resp 16   Wt 157 lb (71.2 kg)   SpO2 100% Comment: room air  BMI 22.53 kg/m  Vitals:   06/14/17 1459 06/14/17 1503  BP: (!) 152/64 (!) 150/64  Pulse: (!) 44   Resp: 16   Temp: 97.7 F (36.5 C)   TempSrc: Oral   SpO2: 100%   Weight: 157 lb (71.2 kg)      Physical Exam   General Appearance:    Alert, cooperative, no distress  Eyes:    PERRL, conjunctiva/corneas clear, EOM's intact       Lungs:     Clear to auscultation bilaterally, respirations unlabored  Heart:    Regular rate and rhythm, II/VI systolic murmur  Ext:   2+ edema left lower leg, 1+ on right. Slight tenderness of left calf.           Assessment & Plan:     1. Left leg pain  - US Venous Img Lower Unilateral Left; Future  2. Edema, unspecified type Improved on low dose of furosemide, but he is only taking 1/2 of 20mg  tablet. Unclear if his leg pains related to recent initiation of furosemide. Will check electroytes.  - Renal function panel - Magnesium  3. Dyspnea on exertion Significantly improved on low dose of furosemide.   4. CAD in native artery Counseled that metoprolol tartrate may not be as effective when taking QD. Will change to metoprolol succinate for more event BP and heartrate.  - metoprolol succinate (TOPROL-XL) 25 MG 24 hr tablet; Take 1 tablet (25 mg total) by mouth daily.  Dispense: 30 tablet; Refill: 1  5. Hyperlipidemia, unspecified hyperlipidemia type .atrova  - Lipid panel  6. DDD (degenerative disc disease), lumbosacral He requests printed refills for the next 3 months- HYDROcodone-acetaminophen (NORCO) 7.5-325 MG tablet; 1 tablet every 4-6 hours as needed  Dispense: 180 tablet; Refill: 0  7. Mitral valve insufficiency, unspecified etiology Lengthy discussion regarding results of recent echo done by his cardiologist.   8. Essential  (primary) hypertension Labile, will see how BP does with change to metoprolol succinate.   Addressed extensive list of chronic and acute medical problems today requiring extensive time in counseling and coordination of care.  Over half of this 45 minute visit were spent in counseling and coordinating care of multiple medical problems.        Lelon Huh, MD  [facility]

## 2017-06-15 LAB — MAGNESIUM: MAGNESIUM: 2 mg/dL (ref 1.6–2.3)

## 2017-06-15 LAB — LIPID PANEL
Chol/HDL Ratio: 2.2 ratio (ref 0.0–5.0)
Cholesterol, Total: 116 mg/dL (ref 100–199)
HDL: 53 mg/dL (ref >39)
LDL CALC: 44 mg/dL (ref 0–99)
Triglycerides: 97 mg/dL (ref 0–149)
VLDL CHOLESTEROL CAL: 19 mg/dL (ref 5–40)

## 2017-06-15 LAB — RENAL FUNCTION PANEL
Albumin: 4.1 g/dL (ref 3.5–4.7)
BUN / CREAT RATIO: 17 (ref 10–24)
BUN: 25 mg/dL (ref 8–27)
CALCIUM: 9.5 mg/dL (ref 8.6–10.2)
CHLORIDE: 102 mmol/L (ref 96–106)
CO2: 26 mmol/L (ref 20–29)
CREATININE: 1.5 mg/dL — AB (ref 0.76–1.27)
GFR calc non Af Amer: 42 mL/min/{1.73_m2} — ABNORMAL LOW (ref >59)
GFR, EST AFRICAN AMERICAN: 49 mL/min/{1.73_m2} — AB (ref >59)
Glucose: 98 mg/dL (ref 65–99)
Phosphorus: 3.9 mg/dL (ref 2.5–4.5)
Potassium: 4.8 mmol/L (ref 3.5–5.2)
Sodium: 141 mmol/L (ref 134–144)

## 2017-06-16 DIAGNOSIS — R0602 Shortness of breath: Secondary | ICD-10-CM | POA: Diagnosis not present

## 2017-06-16 DIAGNOSIS — I2510 Atherosclerotic heart disease of native coronary artery without angina pectoris: Secondary | ICD-10-CM | POA: Diagnosis not present

## 2017-06-17 DIAGNOSIS — I340 Nonrheumatic mitral (valve) insufficiency: Secondary | ICD-10-CM | POA: Insufficient documentation

## 2017-06-20 ENCOUNTER — Ambulatory Visit
Admission: RE | Admit: 2017-06-20 | Discharge: 2017-06-20 | Disposition: A | Discharge status: Home / Self Care | Payer: Medicare Other | Source: Ambulatory Visit | Attending: Family Medicine | Admitting: Family Medicine

## 2017-06-20 ENCOUNTER — Other Ambulatory Visit: Payer: Self-pay | Admitting: Family Medicine

## 2017-06-20 DIAGNOSIS — M79662 Pain in left lower leg: Secondary | ICD-10-CM | POA: Diagnosis not present

## 2017-06-20 DIAGNOSIS — M79661 Pain in right lower leg: Secondary | ICD-10-CM | POA: Diagnosis not present

## 2017-06-20 DIAGNOSIS — M79605 Pain in left leg: Secondary | ICD-10-CM

## 2017-06-20 DIAGNOSIS — M7122 Synovial cyst of popliteal space [Baker], left knee: Secondary | ICD-10-CM | POA: Diagnosis not present

## 2017-06-20 DIAGNOSIS — R936 Abnormal findings on diagnostic imaging of limbs: Secondary | ICD-10-CM | POA: Diagnosis not present

## 2017-06-21 ENCOUNTER — Ambulatory Visit (INDEPENDENT_AMBULATORY_CARE_PROVIDER_SITE_OTHER): Payer: Medicare Other | Admitting: Urology

## 2017-06-21 ENCOUNTER — Encounter: Payer: Self-pay | Admitting: Urology

## 2017-06-21 ENCOUNTER — Telehealth: Payer: Self-pay | Admitting: Family Medicine

## 2017-06-21 ENCOUNTER — Ambulatory Visit: Payer: Medicare Other

## 2017-06-21 ENCOUNTER — Telehealth: Payer: Self-pay

## 2017-06-21 VITALS — BP 135/67 | HR 44 | Ht 70.0 in | Wt 154.0 lb

## 2017-06-21 DIAGNOSIS — J40 Bronchitis, not specified as acute or chronic: Secondary | ICD-10-CM

## 2017-06-21 DIAGNOSIS — I2510 Atherosclerotic heart disease of native coronary artery without angina pectoris: Secondary | ICD-10-CM

## 2017-06-21 DIAGNOSIS — N138 Other obstructive and reflux uropathy: Secondary | ICD-10-CM | POA: Diagnosis not present

## 2017-06-21 DIAGNOSIS — N401 Enlarged prostate with lower urinary tract symptoms: Secondary | ICD-10-CM | POA: Diagnosis not present

## 2017-06-21 DIAGNOSIS — E349 Endocrine disorder, unspecified: Secondary | ICD-10-CM

## 2017-06-21 MED ORDER — AZITHROMYCIN 250 MG PO TABS: ORAL_TABLET | ORAL | 0 refills | Status: AC

## 2017-06-21 NOTE — Telephone Encounter (Signed)
Patient states his cough has come back and is wanting a refill on the Zithromax he had in Dec. He thinks he has  "re-caught this from going to many different medical offices where it's cold". Call to [facility]

## 2017-06-21 NOTE — Telephone Encounter (Signed)
-----   Message from Birdie Sons, MD sent at 06/21/2017 11:14 AM EST ----- Small baker's cyst in left leg which is small fluid filled caused by arthritis in knee, no blood clots.

## 2017-06-21 NOTE — Telephone Encounter (Signed)
Left message to call back  

## 2017-06-21 NOTE — Telephone Encounter (Signed)
Advised patient of results.  

## 2017-06-21 NOTE — Telephone Encounter (Signed)
Please advise 

## 2017-06-21 NOTE — Progress Notes (Addendum)
2:43 PM   Ivan Salazar 16-May-1931 [id number]  Referring provider: Birdie Sons, MD [address] [place], [place] [address]  Chief Complaint  Patient presents with  . Hypogonadism    HPI: 84 yo WM with testosterone deficiency, ED and BPH with LU TS who presents today for a 6 month follow up.   Testosterone deficiency He is currently managing his hypogonadism with testosterone cypionate 1 mL every three weeks.  Patient's most recent testosterone was 364 ng/dL on 06/08/2017.  Hbg 12.1 and  HCT 38.6 %.    Erectile dysfunction He is not sexually active at this time.    BPH WITH LUTS His IPSS score today is 7, which is mild lower urinary tract symptomatology.   He is mostly satisfied with his quality life due to his urinary symptoms.  His previous I PSS score was 4/2.  He denies any dysuria, hematuria or suprapubic pain.  He also denies any recent fevers, chills, nausea or vomiting.  He does not have a family history of PCa. IPSS    Row Name 06/21/17 1400         International Prostate Symptom Score   How often have you had the sensation of not emptying your bladder?  Less than 1 in 5     How often have you had to urinate less than every two hours?  Not at All     How often have you found you stopped and started again several times when you urinated?  Less than 1 in 5 times     How often have you found it difficult to postpone urination?  Less than 1 in 5 times     How often have you had a weak urinary stream?  Less than 1 in 5 times     How often have you had to strain to start urination?  Less than 1 in 5 times     How many times did you typically get up at night to urinate?  2 Times     Total IPSS Score  7       Quality of Life due to urinary symptoms   If you were to spend the rest of your life with your urinary condition just the way it is now how would you feel about that?  Mostly Satisfied        Score:  1-7 Mild 8-19 Moderate 20-35 Severe  Of  note, patient has been experiencing lower leg edema bilaterally and dyspnea on exertion.  He is being followed by cardiology.    PMH: Past Medical History:  Diagnosis Date  . BPH (benign prostatic hyperplasia)   . CAD (coronary artery disease)   . CAD (coronary artery disease)   . Carpal tunnel syndrome   . DDD (degenerative disc disease), cervical   . HLD (hyperlipidemia)   . Hypertension   . Hypogonadism in male   . Impotence   . Nocturia   . Weight loss     Surgical History: Past Surgical History:  Procedure Laterality Date  . APPENDECTOMY    . BILATERAL CARPAL TUNNEL RELEASE     05/12/2011, 06/09/2011  . CARDIAC CATHETERIZATION  2004  . CORONARY ANGIOPLASTY    . CORONARY ARTERY BYPASS GRAFT  2007   5; [facility]  . CORONARY STENT PLACEMENT  2005  . ESOPHAGOGASTRODUODENOSCOPY (EGD) WITH PROPOFOL N/A 10/28/2015   Procedure: ESOPHAGOGASTRODUODENOSCOPY (EGD) WITH PROPOFOL;  Surgeon: Manya Silvas, MD;  Location: [facility];  Service: Endoscopy;  Laterality: N/A;  . TONSILLECTOMY    . UPPER GI ENDOSCOPY  08/02/2010   Dr. Tedra Coupe, gastritis. H Pylori negative    Home Medications:  Allergies as of 06/21/2017      Reactions   Celebrex [celecoxib]    Noxius taste in mouth   Mirtazapine    Bad taste in mouth. Refuses to take again   Prednisone       Medication List        Accurate as of 06/21/17  2:43 PM. Always use your most recent med list.          atorvastatin 80 MG tablet Commonly known as:  LIPITOR TAKE 1 TABLET DAILY   clopidogrel 75 MG tablet Commonly known as:  PLAVIX TAKE 1 TABLET BY MOUTH DAILY   enalapril 5 MG tablet Commonly known as:  VASOTEC TAKE ONE TABLET BY MOUTH DAILY   fluticasone 50 MCG/ACT nasal spray Commonly known as:  FLONASE PLACE 2 SPRAYS INTO BOTH NOSTRILS DAILY.   furosemide 20 MG tablet Commonly known as:  LASIX Take 0.5 tablets (10 mg total) by mouth daily.   guaiFENesin-codeine 100-10 MG/5ML syrup Commonly known as:   CHERATUSSIN AC TAKE 1-2 TEASPOONS EVERY 6 HOURS AS NEEDED FOR COUGH   HYDROcodone-acetaminophen 7.5-325 MG tablet Commonly known as:  NORCO 1 tablet every 4-6 hours as needed   hydrocortisone 2.5 % cream APPLY TO AFFECTED AREA(S) TWO TIMES A DAY AS NEEDED   latanoprost 0.005 % ophthalmic solution Commonly known as:  XALATAN Place 1 drop into both eyes at bedtime.   metoprolol succinate 25 MG 24 hr tablet Commonly known as:  TOPROL-XL Take 1 tablet (25 mg total) by mouth daily.   nitroGLYCERIN 0.4 MG SL tablet Commonly known as:  NITROSTAT Place 1 tablet (0.4 mg total) under the tongue every 5 (five) minutes as needed for chest pain.   ranitidine 300 MG tablet Commonly known as:  ZANTAC TAKE 1 TABLET TWICE DAILY   testosterone cypionate 200 MG/ML injection Commonly known as:  DEPOTESTOSTERONE CYPIONATE Inject 1 mL (200 mg total) into the muscle every 28 (twenty-eight) days.   zolpidem 10 MG tablet Commonly known as:  AMBIEN TAKE 1/2-1 TABLET EVERY NIGHT AT BEDTIME       Allergies:  Allergies  Allergen Reactions  . Celebrex [Celecoxib]     Noxius taste in mouth  . Mirtazapine     Bad taste in mouth. Refuses to take again  . Prednisone     Family History: Family History  Problem Relation Age of Onset  . Cirrhosis Father        of liver  . Kidney disease Neg Hx   . Prostate cancer Neg Hx   . Kidney cancer Neg Hx   . Bladder Cancer Neg Hx     Social History:  reports that he quit smoking about 40 years ago. He has a 10.00 pack-year smoking history. he has never used smokeless tobacco. He reports that he drinks about 8.4 oz of alcohol per week. He reports that he does not use drugs.  ROS: UROLOGY Frequent Urination?: No Hard to postpone urination?: No Burning/pain with urination?: No Get up at night to urinate?: Yes Leakage of urine?: No Urine stream starts and stops?: No Trouble starting stream?: No Do you have to strain to urinate?: No Blood in  urine?: No Urinary tract infection?: No Sexually transmitted disease?: No Injury to kidneys or bladder?: No Painful intercourse?: No Weak stream?: No Erection problems?: No  Penile pain?: No  Gastrointestinal Nausea?: No Vomiting?: No Indigestion/heartburn?: No Diarrhea?: No Constipation?: No  Constitutional Fever: No Night sweats?: No Weight loss?: No Fatigue?: No  Skin Skin rash/lesions?: No Itching?: No  Eyes Blurred vision?: No Double vision?: Yes  Ears/Nose/Throat Sore throat?: No Sinus problems?: No  Hematologic/Lymphatic Swollen glands?: Yes Easy bruising?: Yes  Cardiovascular Leg swelling?: No Chest pain?: Yes  Respiratory Cough?: No Shortness of breath?: No  Endocrine Excessive thirst?: No  Musculoskeletal Back pain?: No Joint pain?: No  Neurological Headaches?: No Dizziness?: No  Psychologic Depression?: No Anxiety?: No  Physical Exam: BP 135/67 (BP Location: Right Arm, Patient Position: Sitting, Cuff Size: Normal)   Pulse (!) 44   Ht 5\' 10"  (1.778 m)   Wt 154 lb (69.9 kg)   BMI 22.10 kg/m   Constitutional: Well nourished. Alert and oriented, No acute distress. HEENT: [place] AT, moist mucus membranes. Trachea midline, no masses. Cardiovascular: No clubbing, cyanosis, or edema. Respiratory: Normal respiratory effort, no increased work of breathing. GI: Abdomen is soft, non tender, non distended, no abdominal masses. Liver and spleen not palpable.  No hernias appreciated.  Stool sample for occult testing is not indicated.   GU: No CVA tenderness.  No bladder fullness or masses.  Patient with circumcised phallus.  Urethral meatus is patent.  No penile discharge. No penile lesions or rashes. Scrotum without lesions, cysts, rashes and/or edema.  Testicles are located scrotally bilaterally. No masses are appreciated in the testicles. Left and right epididymis are normal. Rectal: Patient with  normal sphincter tone. Anus and perineum without  scarring or rashes. No rectal masses are appreciated. Prostate is approximately 50 grams, no nodules are appreciated. Seminal vesicles are normal. Skin: No rashes, bruises or suspicious lesions. Lymph: No cervical or inguinal adenopathy. Neurologic: Grossly intact, no focal deficits, moving all 4 extremities. Psychiatric: Normal mood and affect.    Laboratory Data:  Lab Results  Component Value Date   CREATININE 1.50 (H) 06/14/2017    PSA history  0.3 ng/mL on 11/29/2013  0.5 ng/mL on 03/09/2015  0.5 ng/mL on 06/29/2016  0.4 ng/mL on 06/08/2017  Lab Results  Component Value Date   TESTOSTERONE 364 06/08/2017   I have reviewed the labs.  Assessment & Plan:    1. Testosterone deficiency  -most recent testosterone level is 364 ng/dL on 06/08/2017  -hold his testosterone cypionate 200 mg/mL, 1 cc every 3 weeks until cleared by cardiology   2. BPH with LUTS  - IPSS score is 4/2, it is improving  - Continue conservative management, avoiding bladder irritants and timed voiding's  - RTC in 6 months for IPSS, PSA and exam, as testosterone therapy can cause prostate enlargement and worsen LUTS  3. Erectile dysfunction:     -patient is not currently sexually active   Return for pending Dr. Saralyn Pilar clearance.  Zara Council, [facility] [address] [place], [place] [address] [phone]

## 2017-06-21 NOTE — Telephone Encounter (Signed)
Pt returned call about results. Please advise. Thanks TNP

## 2017-06-22 ENCOUNTER — Telehealth: Payer: Self-pay

## 2017-06-22 DIAGNOSIS — R001 Bradycardia, unspecified: Secondary | ICD-10-CM | POA: Diagnosis not present

## 2017-06-22 NOTE — Telephone Encounter (Signed)
Pt called stating he spoke with cardiology who stated testosterone is not affecting his cardiac issues. Do you want this in writing or is pt's word ok?

## 2017-06-22 NOTE — Telephone Encounter (Signed)
Patient's word is okay.  His medication list was reviewed by the cardiology staff and was expecting them to be okay with the testosterone replacement.

## 2017-06-23 ENCOUNTER — Telehealth: Payer: Self-pay | Admitting: Urology

## 2017-06-23 NOTE — Telephone Encounter (Signed)
Received word from the [place] office that Ivan Salazar needed to speak with me urgently.  I have spoken with Ivan Salazar and he wanted to inform me that Dr. Saralyn Pilar' office will be sending a letter regarding clearance to continue testosterone therapy from a cardiac standpoint.

## 2017-06-26 ENCOUNTER — Telehealth: Payer: Self-pay | Admitting: Family Medicine

## 2017-06-27 ENCOUNTER — Ambulatory Visit (INDEPENDENT_AMBULATORY_CARE_PROVIDER_SITE_OTHER): Payer: Medicare Other | Admitting: Family Medicine

## 2017-06-27 ENCOUNTER — Encounter: Payer: Self-pay | Admitting: Family Medicine

## 2017-06-27 VITALS — BP 122/62 | HR 48 | Temp 97.9°F | Resp 20 | Wt 154.0 lb

## 2017-06-27 DIAGNOSIS — I340 Nonrheumatic mitral (valve) insufficiency: Secondary | ICD-10-CM | POA: Diagnosis not present

## 2017-06-27 DIAGNOSIS — R609 Edema, unspecified: Secondary | ICD-10-CM

## 2017-06-27 DIAGNOSIS — I2510 Atherosclerotic heart disease of native coronary artery without angina pectoris: Secondary | ICD-10-CM | POA: Diagnosis not present

## 2017-06-27 NOTE — Patient Instructions (Addendum)
   Go to [facility] at [address], [place], [place] [address] for prescription compression stockings.    Wear compression stockings all day, but you can take them off when you go to bed at night.   Keep feet elevated above the level of you hips whenever you are not walking.    You can take an extra 1/2 of furosemide a few days a week if the swelling is more than usual

## 2017-06-27 NOTE — Progress Notes (Signed)
Patient: Ivan Salazar Male    DOB: May 09, 1931   84 y.o.   MRN: [id number] Visit Date: 06/27/2017  Today's Provider: Lelon Huh, MD   Chief Complaint  Patient presents with  . Leg Swelling   Subjective:    HPI Patient comes in today for evaluation of swelling in both of his lower extremities. He reports that he has had swelling for quite sometime. He reports that his swelling seems to be worse in the evenings. He denies any pain. He describes it as feeling "tight". He was initially prescribed 20mg  furosemide for CHF a few weeks ago, but cut back to 1/2 tablet as the full tablet made him feel weak and 'cleaned him out' too much.  Wt Readings from Last 3 Encounters:  06/27/17 154 lb (69.9 kg)  06/21/17 154 lb (69.9 kg)  06/14/17 157 lb (71.2 kg)       Allergies  Allergen Reactions  . Celebrex [Celecoxib]     Noxius taste in mouth  . Mirtazapine     Bad taste in mouth. Refuses to take again  . Prednisone      Current Outpatient Medications:  .  atorvastatin (LIPITOR) 80 MG tablet, TAKE 1 TABLET DAILY, Disp: 90 tablet, Rfl: 4 .  clopidogrel (PLAVIX) 75 MG tablet, TAKE 1 TABLET BY MOUTH DAILY, Disp: 90 tablet, Rfl: 4 .  enalapril (VASOTEC) 5 MG tablet, TAKE ONE TABLET BY MOUTH DAILY, Disp: 90 tablet, Rfl: 4 .  fluticasone (FLONASE) 50 MCG/ACT nasal spray, PLACE 2 SPRAYS INTO BOTH NOSTRILS DAILY., Disp: 17 g, Rfl: 5 .  furosemide (LASIX) 20 MG tablet, Take 0.5 tablets (10 mg total) by mouth daily., Disp: 30 tablet, Rfl: 2 .  guaiFENesin-codeine (CHERATUSSIN AC) 100-10 MG/5ML syrup, TAKE 1-2 TEASPOONS EVERY 6 HOURS AS NEEDED FOR COUGH, Disp: 180 mL, Rfl: 3 .  HYDROcodone-acetaminophen (NORCO) 7.5-325 MG tablet, 1 tablet every 4-6 hours as needed, Disp: 180 tablet, Rfl: 0 .  hydrocortisone 2.5 % cream, APPLY TO AFFECTED AREA(S) TWO TIMES A DAY AS NEEDED, Disp: 30 g, Rfl: 5 .  latanoprost (XALATAN) 0.005 % ophthalmic solution, Place 1 drop into both eyes at bedtime. ,  Disp: , Rfl:  .  metoprolol succinate (TOPROL-XL) 25 MG 24 hr tablet, Take 1 tablet (25 mg total) by mouth daily., Disp: 30 tablet, Rfl: 1 .  nitroGLYCERIN (NITROSTAT) 0.4 MG SL tablet, Place 1 tablet (0.4 mg total) under the tongue every 5 (five) minutes as needed for chest pain., Disp: 20 tablet, Rfl: 1 .  ranitidine (ZANTAC) 300 MG tablet, TAKE 1 TABLET TWICE DAILY, Disp: 180 tablet, Rfl: 3 .  testosterone cypionate (DEPOTESTOSTERONE CYPIONATE) 200 MG/ML injection, Inject 1 mL (200 mg total) into the muscle every 28 (twenty-eight) days., Disp: 10 mL, Rfl: 0 .  zolpidem (AMBIEN) 10 MG tablet, TAKE 1/2-1 TABLET EVERY NIGHT AT BEDTIME, Disp: 30 tablet, Rfl: 5  Review of Systems  Respiratory: Positive for shortness of breath. Negative for apnea, cough, choking, chest tightness, wheezing and stridor.   Cardiovascular: Positive for leg swelling. Negative for chest pain and palpitations.  Musculoskeletal: Negative for arthralgias, back pain, gait problem, joint swelling, myalgias, neck pain and neck stiffness.  Neurological: Negative for dizziness and headaches.    Social History   Tobacco Use  . Smoking status: Former Smoker    Packs/day: 0.50    Years: 20.00    Pack years: 10.00    Last attempt to quit: 05/01/1977    Years  since quitting: 40.1  . Smokeless tobacco: Never Used  Substance Use Topics  . Alcohol use: Yes    Alcohol/week: 8.4 oz    Types: 14 Shots of liquor per week    Comment: moderate; 2 drinks daily   Objective:   BP 122/62 (BP Location: Left Arm, Patient Position: Sitting, Cuff Size: Normal)   Pulse (!) 48   Temp 97.9 F (36.6 C)   Resp 20   Wt 154 lb (69.9 kg)   SpO2 100%   BMI 22.10 kg/m  Vitals:   06/27/17 1442  BP: 122/62  Pulse: (!) 48  Resp: 20  Temp: 97.9 F (36.6 C)  SpO2: 100%  Weight: 154 lb (69.9 kg)     Physical Exam   General Appearance:    Alert, cooperative, no distress  Eyes:    PERRL, conjunctiva/corneas clear, EOM's intact         Lungs:     Clear to auscultation bilaterally, respirations unlabored  Heart:    Regular rate and rhythm, II/VI systolic murmur. 2+ edemaright, 1+ left. Few varicosities noted.   Neurologic:   Awake, alert, oriented x 3. No apparent focal neurological           defect.           Assessment & Plan:      1. Edema, unspecified type Not tolerating to full 20mg  furosemide, continue 1/2 x 20mg  for now and given prescription for compression stocking. Advised he should try taking a full 20mg  furosemide every few days as tolerated.   2. Mitral valve insufficiency, unspecified etiology        Lelon Huh, MD  [facility]

## 2017-06-29 DIAGNOSIS — R609 Edema, unspecified: Secondary | ICD-10-CM | POA: Diagnosis not present

## 2017-06-29 DIAGNOSIS — Z951 Presence of aortocoronary bypass graft: Secondary | ICD-10-CM | POA: Diagnosis not present

## 2017-06-29 DIAGNOSIS — I495 Sick sinus syndrome: Secondary | ICD-10-CM | POA: Diagnosis not present

## 2017-06-29 DIAGNOSIS — I2510 Atherosclerotic heart disease of native coronary artery without angina pectoris: Secondary | ICD-10-CM | POA: Diagnosis not present

## 2017-06-29 DIAGNOSIS — R001 Bradycardia, unspecified: Secondary | ICD-10-CM | POA: Diagnosis not present

## 2017-06-29 DIAGNOSIS — R0602 Shortness of breath: Secondary | ICD-10-CM | POA: Diagnosis not present

## 2017-06-29 NOTE — Telephone Encounter (Signed)
LMOM

## 2017-07-03 ENCOUNTER — Telehealth: Payer: Self-pay | Admitting: Urology

## 2017-07-03 NOTE — Telephone Encounter (Signed)
I scheduled pt for depo injection for Wednesday 3/6 at 58.

## 2017-07-05 ENCOUNTER — Ambulatory Visit (INDEPENDENT_AMBULATORY_CARE_PROVIDER_SITE_OTHER): Payer: Medicare Other

## 2017-07-05 DIAGNOSIS — E291 Testicular hypofunction: Secondary | ICD-10-CM

## 2017-07-05 MED ORDER — TESTOSTERONE CYPIONATE 200 MG/ML IM SOLN
200.0000 mg | Freq: Once | INTRAMUSCULAR | Status: AC
  Administered 2017-07-05: 200 mg via INTRAMUSCULAR

## 2017-07-05 NOTE — Progress Notes (Signed)
Testosterone IM Injection  Due to Hypogonadism patient is present today for a Testosterone Injection.  Medication: Testosterone Cypionate Dose: 76mL Location: left upper outer buttocks Lot: [id number] Exp:10/2017  Patient tolerated well, no complications were noted  Preformed by: Toniann Fail, LPN   Follow up: Pt elected to have his injections q4wks.

## 2017-07-06 ENCOUNTER — Other Ambulatory Visit: Payer: Self-pay

## 2017-07-06 ENCOUNTER — Ambulatory Visit
Admission: RE | Admit: 2017-07-06 | Discharge: 2017-07-06 | Disposition: A | Discharge status: Home / Self Care | Payer: Medicare Other | Source: Ambulatory Visit | Attending: Cardiology | Admitting: Cardiology

## 2017-07-06 ENCOUNTER — Encounter
Admission: RE | Admit: 2017-07-06 | Discharge: 2017-07-06 | Disposition: A | Discharge status: Home / Self Care | Payer: Medicare Other | Source: Ambulatory Visit | Attending: Cardiology | Admitting: Cardiology

## 2017-07-06 DIAGNOSIS — J90 Pleural effusion, not elsewhere classified: Secondary | ICD-10-CM | POA: Insufficient documentation

## 2017-07-06 DIAGNOSIS — R9431 Abnormal electrocardiogram [ECG] [EKG]: Secondary | ICD-10-CM | POA: Insufficient documentation

## 2017-07-06 DIAGNOSIS — Z01812 Encounter for preprocedural laboratory examination: Secondary | ICD-10-CM | POA: Diagnosis not present

## 2017-07-06 DIAGNOSIS — Z0183 Encounter for blood typing: Secondary | ICD-10-CM | POA: Insufficient documentation

## 2017-07-06 DIAGNOSIS — I517 Cardiomegaly: Secondary | ICD-10-CM | POA: Diagnosis not present

## 2017-07-06 DIAGNOSIS — Z01818 Encounter for other preprocedural examination: Secondary | ICD-10-CM | POA: Diagnosis not present

## 2017-07-06 DIAGNOSIS — I498 Other specified cardiac arrhythmias: Secondary | ICD-10-CM | POA: Insufficient documentation

## 2017-07-06 DIAGNOSIS — I495 Sick sinus syndrome: Secondary | ICD-10-CM | POA: Diagnosis not present

## 2017-07-06 HISTORY — DX: Gastro-esophageal reflux disease without esophagitis: K21.9

## 2017-07-06 HISTORY — DX: Other allergic rhinitis: J30.89

## 2017-07-06 HISTORY — DX: Unspecified glaucoma: H40.9

## 2017-07-06 LAB — BASIC METABOLIC PANEL
Anion gap: 6 (ref 5–15)
BUN: 30 mg/dL — AB (ref 6–20)
CALCIUM: 9 mg/dL (ref 8.9–10.3)
CO2: 28 mmol/L (ref 22–32)
CREATININE: 1.35 mg/dL — AB (ref 0.61–1.24)
Chloride: 107 mmol/L (ref 101–111)
GFR calc non Af Amer: 47 mL/min — ABNORMAL LOW (ref >60)
GFR, EST AFRICAN AMERICAN: 54 mL/min — AB (ref >60)
Glucose, Bld: 113 mg/dL — ABNORMAL HIGH (ref 65–99)
Potassium: 5.1 mmol/L (ref 3.5–5.1)
Sodium: 141 mmol/L (ref 135–145)

## 2017-07-06 LAB — CBC
HEMATOCRIT: 36.4 % — AB (ref 40.0–52.0)
Hemoglobin: 12.2 g/dL — ABNORMAL LOW (ref 13.0–18.0)
MCH: 32.2 pg (ref 26.0–34.0)
MCHC: 33.6 g/dL (ref 32.0–36.0)
MCV: 95.8 fL (ref 80.0–100.0)
Platelets: 162 10*3/uL (ref 150–440)
RBC: 3.8 MIL/uL — ABNORMAL LOW (ref 4.40–5.90)
RDW: 14.3 % (ref 11.5–14.5)
WBC: 5 10*3/uL (ref 3.8–10.6)

## 2017-07-06 LAB — SURGICAL PCR SCREEN
MRSA, PCR: NEGATIVE
Staphylococcus aureus: NEGATIVE

## 2017-07-06 LAB — PROTIME-INR
INR: 1.04
Prothrombin Time: 13.5 seconds (ref 11.4–15.2)

## 2017-07-06 LAB — APTT: aPTT: 30 seconds (ref 24–36)

## 2017-07-06 NOTE — Pre-Procedure Instructions (Signed)
April 2007 - continued chest pain - catherization and subsequent by-pass surgery (6 grafts) CT  surgeon was Dr. Erskin Burnet  6. May 2007 - Hypertensive crisis at home -transported to [facility] where it was discovered  the graft to the LAD had been misplaced and was anastamosed to a cardiac vein.coil immobiliization  performed by Dr. Patric Dykes  7. July -October 2007 - Cardiac Rehabiitation

## 2017-07-06 NOTE — Patient Instructions (Signed)
Your procedure is scheduled on: 07/13/17 Thurs Report to Same Day Surgery 2nd floor medical mall [facility] Entrance-take elevator on left to 2nd floor.  Check in with surgery information desk.) To find out your arrival time please call [phone] between 1PM - 3PM on 07/12/17 Wed  Remember: Instructions that are not followed completely may result in serious medical risk, up to and including death, or upon the discretion of your surgeon and anesthesiologist your surgery may need to be rescheduled.    _x___ 1. Do not eat food after midnight the night before your procedure. You may drink clear liquids up to 2 hours before you are scheduled to arrive at the hospital for your procedure.  Do not drink clear liquids within 2 hours of your scheduled arrival to the hospital.  Clear liquids include  --Water or Apple juice without pulp  --Clear carbohydrate beverage such as ClearFast or Gatorade  --Black Coffee or Clear Tea (No milk, no creamers, do not add anything to                  the coffee or Tea Type 1 and type 2 diabetics should only drink water.  No gum chewing or hard candies.     __x__ 2. No Alcohol for 24 hours before or after surgery.   __x__3. No Smoking or e-cigarettes for 24 prior to surgery.  Do not use any chewable tobacco products for at least 6 hour prior to surgery   ____  4. Bring all medications with you on the day of surgery if instructed.    __x__ 5. Notify your doctor if there is any change in your medical condition     (cold, fever, infections).    x___6. On the morning of surgery brush your teeth with toothpaste and water.  You may rinse your mouth with mouth wash if you wish.  Do not swallow any toothpaste or mouthwash.   Do not wear jewelry, make-up, hairpins, clips or nail polish.  Do not wear lotions, powders, or perfumes. You may wear deodorant.  Do not shave 48 hours prior to surgery. Men may shave face and neck.  Do not bring valuables to the hospital.     [facility] is not responsible for any belongings or valuables.               Contacts, dentures or bridgework may not be worn into surgery.  Leave your suitcase in the car. After surgery it may be brought to your room.  For patients admitted to the hospital, discharge time is determined by your                       treatment team.  _  Patients discharged the day of surgery will not be allowed to drive home.  You will need someone to drive you home and stay with you the night of your procedure.    Please read over the following fact sheets that you were given:   [facility] Preparing for Surgery and or MRSA Information   _x___ Take anti-hypertensive listed below, cardiac, seizure, asthma,     anti-reflux and psychiatric medicines. These include:  1. metoprolol succinate (TOPROL-XL) 25 MG 24 hr tablet  2.ranitidine (ZANTAC) 300 MG tablet  3.  4.  5.  6.  ____Fleets enema or Magnesium Citrate as directed.   _x___ Use CHG Soap or sage wipes as directed on instruction sheet   ____ Use inhalers on the  day of surgery and bring to hospital day of surgery  ____ Stop Metformin and Janumet 2 days prior to surgery.    ____ Take 1/2 of usual insulin dose the night before surgery and none on the morning     surgery.   _x___ Follow recommendations from Cardiologist, Pulmonologist or PCP regarding          stopping Aspirin, Coumadin, Plavix ,Eliquis, Effient, or Pradaxa, and Pletal. Stop plavix 5 days before surgery per physician order  X____Stop Anti-inflammatories such as Advil, Aleve, Ibuprofen, Motrin, Naproxen, Naprosyn, Goodies powders or aspirin products. OK to take Tylenol and                          Celebrex.   _x___ Stop supplements until after surgery.  But may continue Vitamin D, Vitamin B,       and multivitamin.   ____ Bring C-Pap to the hospital.

## 2017-07-07 NOTE — Pre-Procedure Instructions (Signed)
CXR results sent to Dr. Saralyn Pilar and Anesthesia for review.

## 2017-07-13 ENCOUNTER — Encounter
Admission: RE | Disposition: A | Discharge status: Home / Self Care | Payer: Self-pay | Source: Ambulatory Visit | Attending: Cardiology

## 2017-07-13 ENCOUNTER — Ambulatory Visit: Payer: Medicare Other | Admitting: Anesthesiology

## 2017-07-13 ENCOUNTER — Ambulatory Visit: Payer: Medicare Other

## 2017-07-13 ENCOUNTER — Other Ambulatory Visit: Payer: Self-pay

## 2017-07-13 ENCOUNTER — Observation Stay
Admission: RE | Admit: 2017-07-13 | Discharge: 2017-07-14 | Disposition: A | Discharge status: Home / Self Care | Payer: Medicare Other | Source: Ambulatory Visit | Attending: Cardiology | Admitting: Cardiology

## 2017-07-13 ENCOUNTER — Observation Stay: Payer: Medicare Other

## 2017-07-13 ENCOUNTER — Encounter: Payer: Self-pay | Admitting: *Deleted

## 2017-07-13 DIAGNOSIS — I2510 Atherosclerotic heart disease of native coronary artery without angina pectoris: Secondary | ICD-10-CM | POA: Insufficient documentation

## 2017-07-13 DIAGNOSIS — M1990 Unspecified osteoarthritis, unspecified site: Secondary | ICD-10-CM | POA: Insufficient documentation

## 2017-07-13 DIAGNOSIS — I10 Essential (primary) hypertension: Secondary | ICD-10-CM | POA: Insufficient documentation

## 2017-07-13 DIAGNOSIS — Z951 Presence of aortocoronary bypass graft: Secondary | ICD-10-CM | POA: Insufficient documentation

## 2017-07-13 DIAGNOSIS — E785 Hyperlipidemia, unspecified: Secondary | ICD-10-CM | POA: Insufficient documentation

## 2017-07-13 DIAGNOSIS — K219 Gastro-esophageal reflux disease without esophagitis: Secondary | ICD-10-CM | POA: Insufficient documentation

## 2017-07-13 DIAGNOSIS — Z955 Presence of coronary angioplasty implant and graft: Secondary | ICD-10-CM | POA: Insufficient documentation

## 2017-07-13 DIAGNOSIS — D649 Anemia, unspecified: Secondary | ICD-10-CM | POA: Insufficient documentation

## 2017-07-13 DIAGNOSIS — I340 Nonrheumatic mitral (valve) insufficiency: Secondary | ICD-10-CM | POA: Insufficient documentation

## 2017-07-13 DIAGNOSIS — G709 Myoneural disorder, unspecified: Secondary | ICD-10-CM | POA: Diagnosis not present

## 2017-07-13 DIAGNOSIS — Z950 Presence of cardiac pacemaker: Secondary | ICD-10-CM | POA: Diagnosis present

## 2017-07-13 DIAGNOSIS — Z886 Allergy status to analgesic agent status: Secondary | ICD-10-CM | POA: Diagnosis not present

## 2017-07-13 DIAGNOSIS — R001 Bradycardia, unspecified: Principal | ICD-10-CM | POA: Insufficient documentation

## 2017-07-13 DIAGNOSIS — I495 Sick sinus syndrome: Secondary | ICD-10-CM | POA: Insufficient documentation

## 2017-07-13 DIAGNOSIS — Z79899 Other long term (current) drug therapy: Secondary | ICD-10-CM | POA: Insufficient documentation

## 2017-07-13 DIAGNOSIS — Z87891 Personal history of nicotine dependence: Secondary | ICD-10-CM | POA: Diagnosis not present

## 2017-07-13 DIAGNOSIS — Z7902 Long term (current) use of antithrombotics/antiplatelets: Secondary | ICD-10-CM | POA: Diagnosis not present

## 2017-07-13 HISTORY — PX: PACEMAKER INSERTION: SHX728

## 2017-07-13 SURGERY — INSERTION, CARDIAC PACEMAKER
Anesthesia: Monitor Anesthesia Care | Wound class: Clean

## 2017-07-13 MED ORDER — FENTANYL CITRATE (PF) 100 MCG/2ML IJ SOLN
INTRAMUSCULAR | Status: AC
  Administered 2017-07-13: 25 ug via INTRAVENOUS
  Filled 2017-07-13: qty 2

## 2017-07-13 MED ORDER — FENTANYL CITRATE (PF) 100 MCG/2ML IJ SOLN
25.0000 ug | INTRAMUSCULAR | Status: DC | PRN
  Administered 2017-07-13 (×3): 25 ug via INTRAVENOUS

## 2017-07-13 MED ORDER — IOPAMIDOL (ISOVUE-300) INJECTION 61%
INTRAVENOUS | Status: DC | PRN
  Administered 2017-07-13: 25 mL via INTRAVENOUS

## 2017-07-13 MED ORDER — ONDANSETRON HCL 4 MG/2ML IJ SOLN: 4.0000 mg | Freq: Four times a day (QID) | INTRAMUSCULAR | Status: DC | PRN

## 2017-07-13 MED ORDER — PROPOFOL 500 MG/50ML IV EMUL
INTRAVENOUS | Status: AC
  Filled 2017-07-13: qty 50

## 2017-07-13 MED ORDER — PROPOFOL 10 MG/ML IV BOLUS
INTRAVENOUS | Status: DC | PRN
  Administered 2017-07-13: 10 mg via INTRAVENOUS
  Administered 2017-07-13: 20 mg via INTRAVENOUS

## 2017-07-13 MED ORDER — IOPAMIDOL (ISOVUE-300) INJECTION 61%
INTRAVENOUS | Status: DC | PRN
  Administered 2017-07-13: 20 mL via INTRAVENOUS

## 2017-07-13 MED ORDER — SODIUM CHLORIDE 0.9 % IR SOLN
Freq: Once | Status: DC
  Filled 2017-07-13: qty 2

## 2017-07-13 MED ORDER — SODIUM CHLORIDE 0.9% FLUSH
3.0000 mL | Freq: Two times a day (BID) | INTRAVENOUS | Status: DC
  Administered 2017-07-13 – 2017-07-14 (×2): 3 mL via INTRAVENOUS

## 2017-07-13 MED ORDER — PNEUMOCOCCAL VAC POLYVALENT 25 MCG/0.5ML IJ INJ: 0.5000 mL | INJECTION | INTRAMUSCULAR | Status: DC

## 2017-07-13 MED ORDER — CEFAZOLIN SODIUM-DEXTROSE 1-4 GM/50ML-% IV SOLN
1.0000 g | Freq: Four times a day (QID) | INTRAVENOUS | Status: AC
  Administered 2017-07-13 – 2017-07-14 (×3): 1 g via INTRAVENOUS
  Filled 2017-07-13 (×4): qty 50

## 2017-07-13 MED ORDER — PROMETHAZINE HCL 25 MG/ML IJ SOLN: 6.2500 mg | INTRAMUSCULAR | Status: DC | PRN

## 2017-07-13 MED ORDER — LACTATED RINGERS IV SOLN
INTRAVENOUS | Status: DC
  Administered 2017-07-13: 11:00:00 via INTRAVENOUS

## 2017-07-13 MED ORDER — CEFAZOLIN SODIUM-DEXTROSE 1-4 GM/50ML-% IV SOLN
INTRAVENOUS | Status: AC
  Filled 2017-07-13: qty 50

## 2017-07-13 MED ORDER — FENTANYL CITRATE (PF) 100 MCG/2ML IJ SOLN
INTRAMUSCULAR | Status: AC
  Filled 2017-07-13: qty 2

## 2017-07-13 MED ORDER — CEFAZOLIN SODIUM-DEXTROSE 1-4 GM/50ML-% IV SOLN
1.0000 g | Freq: Once | INTRAVENOUS | Status: AC
  Administered 2017-07-13: 1 g via INTRAVENOUS

## 2017-07-13 MED ORDER — ENALAPRIL MALEATE 5 MG PO TABS
5.0000 mg | ORAL_TABLET | Freq: Every day | ORAL | Status: DC
  Administered 2017-07-13 – 2017-07-14 (×2): 5 mg via ORAL
  Filled 2017-07-13 (×2): qty 1

## 2017-07-13 MED ORDER — ACETAMINOPHEN 325 MG PO TABS: 325.0000 mg | ORAL_TABLET | ORAL | Status: DC | PRN

## 2017-07-13 MED ORDER — HYDROCODONE-ACETAMINOPHEN 7.5-325 MG PO TABS
1.0000 | ORAL_TABLET | Freq: Four times a day (QID) | ORAL | Status: DC | PRN
  Administered 2017-07-13 – 2017-07-14 (×2): 1 via ORAL
  Filled 2017-07-13 (×2): qty 1

## 2017-07-13 MED ORDER — ZOLPIDEM TARTRATE 5 MG PO TABS
5.0000 mg | ORAL_TABLET | Freq: Every evening | ORAL | Status: DC | PRN
  Administered 2017-07-13: 5 mg via ORAL
  Filled 2017-07-13: qty 1

## 2017-07-13 MED ORDER — GENTAMICIN SULFATE 40 MG/ML IJ SOLN
INTRAMUSCULAR | Status: AC
  Filled 2017-07-13: qty 2

## 2017-07-13 MED ORDER — SODIUM CHLORIDE 0.9 % IR SOLN
Status: DC | PRN
  Administered 2017-07-13: 50 mL

## 2017-07-13 MED ORDER — FLUTICASONE PROPIONATE 50 MCG/ACT NA SUSP
2.0000 | Freq: Every day | NASAL | Status: DC
  Administered 2017-07-14: 2 via NASAL
  Filled 2017-07-13: qty 16

## 2017-07-13 MED ORDER — HYDROCODONE-ACETAMINOPHEN 7.5-325 MG PO TABS: 1.0000 | ORAL_TABLET | Freq: Once | ORAL | Status: DC | PRN

## 2017-07-13 MED ORDER — PROPOFOL 500 MG/50ML IV EMUL
INTRAVENOUS | Status: DC | PRN
  Administered 2017-07-13: 100 ug/kg/min via INTRAVENOUS

## 2017-07-13 MED ORDER — LIDOCAINE 1 % OPTIME INJ - NO CHARGE
INTRAMUSCULAR | Status: DC | PRN
  Administered 2017-07-13: 26 mL

## 2017-07-13 MED ORDER — ATORVASTATIN CALCIUM 20 MG PO TABS
80.0000 mg | ORAL_TABLET | Freq: Every day | ORAL | Status: DC
Start: 2017-07-13 — End: 2017-07-14
  Administered 2017-07-13: 80 mg via ORAL
  Filled 2017-07-13: qty 4

## 2017-07-13 MED ORDER — FENTANYL CITRATE (PF) 100 MCG/2ML IJ SOLN
INTRAMUSCULAR | Status: DC | PRN
  Administered 2017-07-13: 50 ug via INTRAVENOUS

## 2017-07-13 SURGICAL SUPPLY — 37 items
BAG DECANTER FOR FLEXI CONT (MISCELLANEOUS) ×3 IMPLANT
BRUSH SCRUB EZ  4% CHG (MISCELLANEOUS) ×2
BRUSH SCRUB EZ 4% CHG (MISCELLANEOUS) ×1 IMPLANT
CABLE SURG 12 DISP A/V CHANNEL (MISCELLANEOUS) ×3 IMPLANT
CANISTER SUCT 1200ML W/VALVE (MISCELLANEOUS) ×3 IMPLANT
CHLORAPREP W/TINT 26ML (MISCELLANEOUS) ×3 IMPLANT
COVER LIGHT HANDLE STERIS (MISCELLANEOUS) ×6 IMPLANT
COVER MAYO STAND STRL (DRAPES) ×3 IMPLANT
DRAPE C-ARM XRAY 36X54 (DRAPES) ×3 IMPLANT
DRSG TEGADERM 4X4.75 (GAUZE/BANDAGES/DRESSINGS) ×3 IMPLANT
DRSG TELFA 4X3 1S NADH ST (GAUZE/BANDAGES/DRESSINGS) ×3 IMPLANT
ELECT REM PT RETURN 9FT ADLT (ELECTROSURGICAL) ×3
ELECTRODE REM PT RTRN 9FT ADLT (ELECTROSURGICAL) ×1 IMPLANT
GLOVE BIO SURGEON STRL SZ7.5 (GLOVE) ×3 IMPLANT
GLOVE BIO SURGEON STRL SZ8 (GLOVE) ×3 IMPLANT
GOWN STRL REUS W/ TWL LRG LVL3 (GOWN DISPOSABLE) ×1 IMPLANT
GOWN STRL REUS W/ TWL XL LVL3 (GOWN DISPOSABLE) ×1 IMPLANT
GOWN STRL REUS W/TWL LRG LVL3 (GOWN DISPOSABLE) ×2
GOWN STRL REUS W/TWL XL LVL3 (GOWN DISPOSABLE) ×2
IMMOBILIZER SHDR MD LX WHT (SOFTGOODS) IMPLANT
IMMOBILIZER SHDR XL LX WHT (SOFTGOODS) IMPLANT
INTRO PACEMAKR LEAD 9FR 13CM (INTRODUCER) ×3
INTRO PACEMKR SHEATH II 7FR (MISCELLANEOUS) ×3
INTRODUCER PACEMKR LD 9FR 13CM (INTRODUCER) ×1 IMPLANT
INTRODUCER PACEMKR SHTH II 7FR (MISCELLANEOUS) ×1 IMPLANT
IPG PACE AZUR XT DR MRI W1DR01 (Pacemaker) ×1 IMPLANT
IV NS 500ML (IV SOLUTION) ×2
IV NS 500ML BAXH (IV SOLUTION) ×1 IMPLANT
KIT TURNOVER KIT A (KITS) ×3 IMPLANT
LABEL OR SOLS (LABEL) ×3 IMPLANT
LEAD CAPSURE NOVUS 5076-52CM (Lead) ×3 IMPLANT
LEAD CAPSURE NOVUS 5076-58CM (Lead) ×3 IMPLANT
MARKER SKIN DUAL TIP RULER LAB (MISCELLANEOUS) ×3 IMPLANT
PACE AZURE XT DR MRI W1DR01 (Pacemaker) ×3 IMPLANT
PACK PACE INSERTION (MISCELLANEOUS) ×3 IMPLANT
PAD ONESTEP ZOLL R SERIES ADT (MISCELLANEOUS) ×3 IMPLANT
SUT SILK 0 SH 30 (SUTURE) ×9 IMPLANT

## 2017-07-13 NOTE — H&P (Signed)
Jump to Section ? Document InformationEncounter DetailsLast Filed Vital SignsPatient DemographicsPatient InstructionsPlan of TreatmentProgress NotesReason for VisitSocial HistoryVisit Diagnoses Ivan Salazar Summary, generated on Mar. 06, 2019 Printout Information  Document Contents Document Received Date Document Source Organization  Office Visit Mar. 06, 2019 [place]   Patient Demographics - 84 y.o. Male; born 1931-06-28   Patient Address Communication Language Race / Ethnicity  [address] [place], [place] [address] [phone] (Home) [email or web address] English (Preferred) White / Not Hispanic or Latino   Reason for Visit   Reason Comments  Follow-up holter    Encounter Details   Date Type Department Care Team Description  06/29/2017 Office Visit [facility]  [place], [place] [address]  [phone]  Ivan Cowman, MD  [place]  [facility]  [place], [place] [address]  [phone]  [phone] (Fax)  Coronary artery disease involving native coronary artery of native heart without angina pectoris (Primary Dx);  Peripheral edema;  H/O heart bypass surgery;  Exertional shortness of breath;  Sick sinus syndrome (CMS-HCC)   Social History - documented as of this encounter  Tobacco Use Types Packs/Day Years Used Date  Former Smoker      Smokeless Tobacco: Never Used      Alcohol Use Drinks/Week oz/Week Comments  Yes      Sex Assigned at [employer] Date Recorded  Not on file    Job Start Date Occupation Industry  Not on file Not on file Not on file   Travel History Travel Start Travel End  No recent travel history available.     Last Filed Vital Signs - documented in this encounter  Vital Sign Reading Time Taken  Blood Pressure 122/58 06/29/2017 12:08 PM EST  Pulse 56 06/29/2017 12:08 PM EST  Temperature - -  Respiratory Rate - -   Oxygen Saturation - -  Inhaled Oxygen Concentration - -  Weight 69.4 kg (153 lb) 06/29/2017 12:08 PM EST  Height 177.8 cm (5\' 10" ) 06/29/2017 12:08 PM EST  Body Mass Index 21.95 06/29/2017 12:08 PM EST   Patient Instructions - documented in this encounter  Patient Instructions Ivan Cowman, MD - 06/29/2017 11:45 AM EST   Patient Education    DASH Diet: Care Instructions Your Care Instructions  The DASH diet is an eating plan that can help lower your blood pressure. DASH stands for Dietary Approaches to Stop Hypertension. Hypertension is high blood pressure. The DASH diet focuses on eating foods that are high in calcium, potassium, and magnesium. These nutrients can lower blood pressure. The foods that are highest in these nutrients are fruits, vegetables, low-fat dairy products, nuts, seeds, and legumes. But taking calcium, potassium, and magnesium supplements instead of eating foods that are high in those nutrients does not have the same effect. The DASH diet also includes whole grains, fish, and poultry. The DASH diet is one of several lifestyle changes your doctor may recommend to lower your high blood pressure. Your doctor may also want you to decrease the amount of sodium in your diet. Lowering sodium while following the DASH diet can lower blood pressure even further than just the DASH diet alone. Follow-up care is a key part of your treatment and safety. Be sure to make and go to all appointments, and call your doctor if you are having problems. It's also a good idea to know your test results and keep a list of the medicines you take. How can you  care for yourself at home? Following the DASH diet  Eat 4 to 5 servings of fruit each day. A serving is 1 medium-sized piece of fruit,  cup chopped or canned fruit, 1/4 cup dried fruit, or 4 ounces ( cup) of fruit juice. Choose fruit more often than fruit juice.  Eat 4 to 5 servings of vegetables each day. A serving is 1 cup of  lettuce or raw leafy vegetables,  cup of chopped or cooked vegetables, or 4 ounces ( cup) of vegetable juice. Choose vegetables more often than vegetable juice.  Get 2 to 3 servings of low-fat and fat-free dairy each day. A serving is 8 ounces of milk, 1 cup of yogurt, or 1  ounces of cheese.  Eat 6 to 8 servings of grains each day. A serving is 1 slice of bread, 1 ounce of dry cereal, or  cup of cooked rice, pasta, or cooked cereal. Try to choose whole-grain products as much as possible.  Limit lean meat, poultry, and fish to 2 servings each day. A serving is 3 ounces, about the size of a deck of cards.  Eat 4 to 5 servings of nuts, seeds, and legumes (cooked dried beans, lentils, and split peas) each week. A serving is 1/3 cup of nuts, 2 tablespoons of seeds, or  cup of cooked beans or peas.  Limit fats and oils to 2 to 3 servings each day. A serving is 1 teaspoon of vegetable oil or 2 tablespoons of salad dressing.  Limit sweets and added sugars to 5 servings or less a week. A serving is 1 tablespoon jelly or jam,  cup sorbet, or 1 cup of lemonade.  Eat less than 2,300 milligrams (mg) of sodium a day. If you limit your sodium to 1,500 mg a day, you can lower your blood pressure even more. Tips for success  Start small. Do not try to make dramatic changes to your diet all at once. You might feel that you are missing out on your favorite foods and then be more likely to not follow the plan. Make small changes, and stick with them. Once those changes become habit, add a few more changes.  Try some of the following: ? Make it a goal to eat a fruit or vegetable at every meal and at snacks. This will make it easy to get the recommended amount of fruits and vegetables each day. ? Try yogurt topped with fruit and nuts for a snack or healthy dessert. ? Add lettuce, tomato, cucumber, and onion to sandwiches. ? Combine a ready-made pizza crust with low-fat mozzarella cheese and lots of vegetable  toppings. Try using tomatoes, squash, spinach, broccoli, carrots, cauliflower, and onions. ? Have a variety of cut-up vegetables with a low-fat dip as an appetizer instead of chips and dip. ? Sprinkle sunflower seeds or chopped almonds over salads. Or try adding chopped walnuts or almonds to cooked vegetables. ? Try some vegetarian meals using beans and peas. Add garbanzo or kidney beans to salads. Make burritos and tacos with mashed pinto beans or black beans. Where can you learn more? Log in to your [facility] My[facility] account at [email or web address] and click on top menu option "Health" then select "Search Medical Library". Enter [phone] in the search box and click the magnify glass to learn more about "DASH Diet: Care Instructions." Current as of: November 20, 2016 Content Version: 11.9  2006-2018 Healthwise, Incorporated. Care instructions adapted under license by your healthcare professional. If you have questions about a medical  condition or this instruction, always ask your healthcare professional. [place] any warranty or liability for your use of this information.    Patient Education    Learning About High Cholesterol What is high cholesterol?  Cholesterol is a type of fat in your blood. It is needed for many body functions, such as making new cells. Cholesterol is made by your body. It also comes from food you eat. If you have too much cholesterol, it starts to build up in your arteries. This is called hardening of the arteries, or atherosclerosis. High cholesterol raises your risk of a heart attack and stroke. There are different types of cholesterol. LDL is the "bad" cholesterol. High LDL can raise your risk for heart disease, heart attack, and stroke. HDL is the "good" cholesterol. High HDL is linked with a lower risk for heart disease, heart attack, and stroke. Your cholesterol levels help your doctor find out your risk for having a heart attack or stroke. How can  you prevent high cholesterol? A heart-healthy lifestyle can help you prevent high cholesterol. This lifestyle helps lower your risk for a heart attack and stroke.  Eat heart-healthy foods. ? Eat fruits, vegetables, whole grains (like oatmeal), dried beans and peas, nuts and seeds, soy products (like tofu), and fat-free or low-fat dairy products. ? Replace butter, margarine, and hydrogenated or partially hydrogenated oils with olive and canola oils. (Canola oil margarine without trans fat is fine.) ? Replace red meat with fish, poultry, and soy protein (like tofu). ? Limit processed and packaged foods like chips, crackers, and cookies.  Be active. Exercise can improve your cholesterol level. Get at least 30 minutes of exercise on most days of the week. Walking is a good choice. You also may want to do other activities, such as running, swimming, cycling, or playing tennis or team sports.  Stay at a healthy weight. Lose weight if you need to.  Don't smoke. If you need help quitting, talk to your doctor about stop-smoking programs and medicines. These can increase your chances of quitting for good. How is high cholesterol treated? The goal of treatment is to reduce your chances of having a heart attack or stroke. The goal is not to lower your cholesterol numbers only.  You may make lifestyle changes, such as eating healthy foods, not smoking, losing weight, and being more active.  You may have to take medicine. Follow-up care is a key part of your treatment and safety. Be sure to make and go to all appointments, and call your doctor if you are having problems. It's also a good idea to know your test results and keep a list of the medicines you take. Where can you learn more? Log in to your [facility] MyChart account at [email or web address] and click on top menu option "Health" then select "Search Medical Library". Enter [phone] in the search box and click the magnify glass to learn more about "Learning  About High Cholesterol." Current as of: November 20, 2016 Content Version: 11.9  2006-2018 Healthwise, Incorporated. Care instructions adapted under license by your healthcare professional. If you have questions about a medical condition or this instruction, always ask your healthcare professional. [place] any warranty or liability for your use of this information.     Electronically signed by Ivan Cowman, MD at 06/29/2017 12:23 PM EST       Progress Notes - documented in this encounter  Ivan Cowman, MD - 06/29/2017 11:45 AM EST Formatting of this note might be  different from the original. Established Patient Visit   Chief Complaint: Chief Complaint  Patient presents with  . Follow-up  holter  Date of Service: 06/29/2017 Date of Birth: [birth year] PCP: Birdie Sons, MD  History of Present Illness: Ivan Salazar is a 84 y.o.male patient who returns for  1. Status post multiple coronary stents 2. Status post CABG 2007 [facility] 3. Essential hypertension 4. Hyperlipidemia 5. Sinus bradycardia  She was recently seen 06/22/2017 with fatigue, shortness of breath, and intermittent dizziness. Patient noted to be bradycardic with a heart rate of 46 bpm. 24-hour Holter monitor was performed which revealed marked sinus bradycardia with mean heart rate of 44 bpm, with minimal heart rate of 31 bpm, and maximum heart rate of only 80 bpm. 2D echocardiogram 06/05/2017 revealed LVEF of 50% with moderate mitral regurgitation. Renova study 06/16/2017 revealed normal left ventricular function, with LVEF of 56%, without evidence for scar or ischemia.   Patient has essential hypertension, blood pressure well controlled on metoprolol succinate and enalapril, which are well tolerated without apparent side effects. The patient follows a low-sodium, no added salt diet.  Patient has hyperlipidemia, LDL was 42 on 11/04/2016, on atorvastatin, which is well  tolerated without apparent side effects, followed by his primary care provider. The patient follows a low-cholesterol, low-fat diet.  Past Medical and Surgical History  Past Medical History History reviewed. No pertinent past medical history.  Past Surgical History He has a past surgical history that includes egd (08/02/2010); UPPER EUS (07/05/2007); ercp (07/05/2007); Colonoscopy (08/02/2010); and egd (10/28/2015).   Medications and Allergies  Current Medications  Current Outpatient Medications  Medication Sig Dispense Refill  . atorvastatin (LIPITOR) 80 MG tablet Take 80 mg by mouth once daily.  . clopidogrel (PLAVIX) 75 mg tablet Take 75 mg by mouth once daily.  . enalapril (VASOTEC) 5 MG tablet Take 5 mg by mouth once daily.  . fluticasone (FLONASE) 50 mcg/actuation nasal spray Place 2 sprays into both nostrils once daily.  Marland Kitchen HYDROcodone-acetaminophen (NORCO) 7.5-325 mg tablet Take 1 tablet by mouth every 6 (six) hours as needed for Pain.  . latanoprost (XALATAN) 0.005 % ophthalmic solution 1 drop nightly.  . nitroGLYcerin (NITROSTAT) 0.4 MG SL tablet Place 0.4 mg under the tongue every 5 (five) minutes as needed for Chest pain. May take up to 3 doses.  . ranitidine (ZANTAC) 300 MG tablet Take 300 mg by mouth nightly.  . testosterone cypionate (DEPO-TESTOSTERONE) 200 mg/mL injection Inject into the muscle every 28 (twenty-eight) days.  Marland Kitchen zolpidem (AMBIEN) 10 mg tablet Take 10 mg by mouth nightly as needed for Sleep.   No current facility-administered medications for this visit.   Allergies: Patient has no known allergies.  Social and Family History  Social History reports that he has quit smoking. He has never used smokeless tobacco. He reports that he drinks alcohol.  Family History History reviewed. No pertinent family history.  Review of Systems   Review of Systems: The patient denies chest pain, shortness of breath, orthopnea, paroxysmal nocturnal dyspnea, pedal edema,  palpitations, heart racing, presyncope, syncope. Review of 12 Systems is negative except as described above.  Physical Examination   Vitals:BP 122/58  Pulse 56  Ht 177.8 cm (5\' 10" )  Wt 69.4 kg (153 lb)  BMI 21.95 kg/m  Ht:177.8 cm (5\' 10" ) Wt:69.4 kg (153 lb) OEV:OJJK surface area is 1.85 meters squared. Body mass index is 21.95 kg/m.  HEENT: Pupils equally reactive to light and accomodation  Neck: Supple without  thyromegaly, carotid pulses 2+ Lungs: clear to auscultation bilaterally; no wheezes, rales, rhonchi Heart: Regular rate and rhythm. No gallops, murmurs or rub Abdomen: soft nontender, nondistended, with normal bowel sounds Extremities: no cyanosis, clubbing, or edema Peripheral Pulses: 2+ in all extremities, 2+ femoral pulses bilaterally  Assessment   84 y.o. male with  1. Coronary artery disease involving native coronary artery of native heart without angina pectoris  2. Peripheral edema  3. H/O heart bypass surgery  4. Exertional shortness of breath  5. Sick sinus syndrome (CMS-HCC)   84 year old gentleman with known coronary disease, history of multiple coronary stents and bypass, with recent history of exertional dyspnea, without chest pain. 2D echocardiogram revealed preserved left ventricular function. Lexiscan Myoview did not reveal evidence for scar or ischemia. Patient has essential hypertension, blood pressure well controlled on current BP medications. Patient noted to be bradycardic, traumatic with exertional dyspnea, intermittent dizziness with fatigue, and chronic peripheral edema, with 24-hour Holter monitor revealing marked sinus bradycardia, with mean heart rate of 44 bpm, with minimal heart rate of 31 bpm and maximum heart rate of 80 bpm.  Plan   1. Continue current medications 2. Counseled patient about low-sodium diet 3. DASH diet printed instructions given to the patient 4. Counseled patient about low-cholesterol diet 5. Continue atorvastatin for  hyperlipidemia management 6. Low-fat and cholesterol diet printed instructions given to the patient 7. Proceed with dual-chamber pacemaker implantation. The risks, benefits alternatives of permanent pacemaker implantation were explained to the patient and informed consent was obtained. 8. Hold clopidogrel 5 days prior to permanent pacemaker implantation  No orders of the defined types were placed in this encounter.  Return in about 1 week (around 07/06/2017), or after pacemaker.  Ivan Cowman, MD PhD [facility]    Electronically signed by Ivan Cowman, MD at 06/29/2017 5:23 PM EST     Plan of Treatment - documented as of this encounter  Upcoming Encounters Upcoming Encounters  Date Type Specialty Care Team Description  07/20/2017 Office Visit [place], [place], [place]  [place], [place] [address]  [phone]  [phone] (Fax)     Visit Diagnoses - documented in this encounter  Diagnosis  Coronary artery disease involving native coronary artery of native heart without angina pectoris - Primary   Peripheral edema  Edema   H/O heart bypass surgery   Exertional shortness of breath   Sick sinus syndrome (CMS-HCC)  Sinoatrial node dysfunction    Images Document Information  Primary Care Provider Other Service Providers Document Coverage Dates  Birdie Sons MD (Sep 22, 2016 - Present) [phone] (Work) [phone] (Fax) [address] [place], [place] [address]   Feb. 28, 2019   [facility] [address] [place], [place] [address]   Encounter Providers Encounter Date  Ivan Cowman MD (Attending) [phone] (Work) [phone] (Fax) [place] [facility] [place],  [address]  Feb. 28, 2019    Show All Sections

## 2017-07-13 NOTE — Plan of Care (Signed)
Continue to educate patient about safety precautions to follow after discharge.

## 2017-07-13 NOTE — Anesthesia Post-op Follow-up Note (Signed)
Anesthesia QCDR form completed.        

## 2017-07-13 NOTE — Anesthesia Preprocedure Evaluation (Addendum)
Anesthesia Evaluation  Patient identified by MRN, date of birth, ID band Patient awake    Reviewed: Allergy & Precautions, H&P , NPO status , reviewed documented beta blocker date and time   Airway Mallampati: II  TM Distance: >3 FB     Dental  (+) Teeth Intact   Pulmonary former smoker,    Pulmonary exam normal        Cardiovascular hypertension, Pt. on medications + CAD and + CABG   Rhythm:regular Rate:Bradycardia     Neuro/Psych  Neuromuscular disease    GI/Hepatic GERD  ,  Endo/Other    Renal/GU      Musculoskeletal  (+) Arthritis , Osteoarthritis,    Abdominal   Peds  Hematology  (+) anemia ,   Anesthesia Other Findings   Reproductive/Obstetrics                            Anesthesia Physical Anesthesia Plan  ASA: III  Anesthesia Plan: General and MAC   Post-op Pain Management:    Induction:   PONV Risk Score and Plan: 2 and Propofol infusion  Airway Management Planned:   Additional Equipment:   Intra-op Plan:   Post-operative Plan:   Informed Consent: I have reviewed the patients History and Physical, chart, labs and discussed the procedure including the risks, benefits and alternatives for the proposed anesthesia with the patient or authorized representative who has indicated his/her understanding and acceptance.   Dental Advisory Given  Plan Discussed with: CRNA  Anesthesia Plan Comments:        Anesthesia Quick Evaluation

## 2017-07-13 NOTE — Transfer of Care (Signed)
Immediate Anesthesia Transfer of Care Note  Patient: JAFARI MCKILLOP  Procedure(s) Performed: Procedure(s): INSERTION PACEMAKER (N/A)  Patient Location: PACU  Anesthesia Type:General  Level of Consciousness: sedated  Airway & Oxygen Therapy: Patient Spontanous Breathing and Patient connected to face mask oxygen  Post-op Assessment: Report given to RN and Post -op Vital signs reviewed and stable  Post vital signs: Reviewed and stable  Last Vitals:  Vitals:   07/13/17 1339 07/13/17 1340  BP: 139/79   Pulse: (!) 59   Resp: 13 18  Temp: 36.6 C 36.6 C  SpO2: 72%     Complications: No apparent anesthesia complications

## 2017-07-13 NOTE — Op Note (Signed)
[facility]   07/13/2017                     1:35 PM  PATIENT:  Ivan Salazar    PRE-OPERATIVE DIAGNOSIS:  SSS  POST-OPERATIVE DIAGNOSIS:  Same  PROCEDURE:  INSERTION PACEMAKER  SURGEON:  Isaias Cowman, MD    ANESTHESIA:     PREOPERATIVE INDICATIONS:  Ivan Salazar is a  84 y.o. male with a diagnosis of SSS who failed conservative measures and elected for surgical management.    The risks benefits and alternatives were discussed with the patient preoperatively including but not limited to the risks of infection, bleeding, cardiopulmonary complications, the need for revision surgery, among others, and the patient was willing to proceed.   OPERATIVE PROCEDURE: Patient was brought to the operating room in a fasting state.  The left pectoral region was prepped and draped in usual sterile manner.  Anesthesia was obtained 1% lidocaine locally.  A 6 cm incision was performed the left pectoral region.  The pacemaker pocket was generated by electrocautery and blunt dissection.  Access was obtained to the left subclavian vein by fine-needle aspiration.  MRI compatible leads were positioned to the right ventricular apical septum ( Medtronic [id number] ) and right atrial appendage ( [id number] ) under fluoroscopic guidance.  After proper thresholds were obtained the leads were sutured in place.  The leads were connected to a MRI compatible dual-chamber rate responsive pacemaker generator ( Medtronic [id number] ).  Pacemaker pocket was irrigated gentamicin solution.  The pacemaker generator was positioned into the pocket and the pocket was closed with 2-0 and 4-0 Vicryl, respectively.  Steri-Strips and a pressure dressing were applied.  Postprocedural interrogation revealed appropriate dual-chamber sensing and pacing thresholds.  There were no periprocedural complications.

## 2017-07-13 NOTE — Interval H&P Note (Signed)
History and Physical Interval Note:  07/13/2017 12:13 PM  Ivan Salazar  has presented today for surgery, with the diagnosis of SSS  The various methods of treatment have been discussed with the patient and family. After consideration of risks, benefits and other options for treatment, the patient has consented to  Procedure(s): INSERTION PACEMAKER (N/A) as a surgical intervention .  The patient's history has been reviewed, patient examined, no change in status, stable for surgery.  I have reviewed the patient's chart and labs.  Questions were answered to the patient's satisfaction.     Corinna Burkman [employer]

## 2017-07-14 ENCOUNTER — Encounter: Payer: Self-pay | Admitting: Cardiology

## 2017-07-14 DIAGNOSIS — I495 Sick sinus syndrome: Secondary | ICD-10-CM | POA: Diagnosis not present

## 2017-07-14 DIAGNOSIS — Z950 Presence of cardiac pacemaker: Secondary | ICD-10-CM | POA: Diagnosis not present

## 2017-07-14 DIAGNOSIS — R001 Bradycardia, unspecified: Secondary | ICD-10-CM | POA: Diagnosis not present

## 2017-07-14 DIAGNOSIS — Z87891 Personal history of nicotine dependence: Secondary | ICD-10-CM | POA: Diagnosis not present

## 2017-07-14 DIAGNOSIS — I340 Nonrheumatic mitral (valve) insufficiency: Secondary | ICD-10-CM | POA: Diagnosis not present

## 2017-07-14 DIAGNOSIS — I2510 Atherosclerotic heart disease of native coronary artery without angina pectoris: Secondary | ICD-10-CM | POA: Diagnosis not present

## 2017-07-14 DIAGNOSIS — I10 Essential (primary) hypertension: Secondary | ICD-10-CM | POA: Diagnosis not present

## 2017-07-14 MED ORDER — CEPHALEXIN 250 MG PO CAPS: 500.0000 mg | ORAL_CAPSULE | Freq: Two times a day (BID) | ORAL | 0 refills | Status: DC

## 2017-07-14 NOTE — Care Management Obs Status (Signed)
[facility] NOTIFICATION   Patient Details  Name: Ivan Salazar MRN: [id number] Date of Birth: Jan 15, 1932   Medicare Observation Status Notification Given:  Yes Son Scott signed    Shelbie Ammons, [place] 07/14/2017, 9:23 AM

## 2017-07-14 NOTE — Discharge Summary (Signed)
Physician Discharge Summary  Patient ID: Ivan Salazar MRN: [id number] DOB/AGE: 12/07/1931 84 y.o.  Admit date: 07/13/2017 Discharge date: 07/14/2017  Primary Discharge Diagnosis Sinus bradycardia Secondary Discharge Diagnosis Sick sinus syndrome  Significant Diagnostic Studies: yes  Consults: None  Hospital Course: Patient underwent dual chamber pacemaker implantation on 9/74/1638 without complication. On morning of 07/13/2017, patient ambulating  without difficulty . Telemetry revealed atrial paced rhythm. Patient discharged home in stable condition.   Discharge Exam: Blood pressure (!) 154/53, pulse (!) 58, temperature 98.3 F (36.8 C), temperature source Oral, resp. rate 18, height 5\' 10"  (1.778 m), weight 72.2 kg (159 lb 3.2 oz), SpO2 100 %.   General appearance: alert Head: Normocephalic, without obvious abnormality, atraumatic Eyes: conjunctivae/corneas clear. PERRL, EOM's intact. Fundi benign. Ears: normal TM's and external ear canals both ears Nose: Nares normal. Septum midline. Mucosa normal. No drainage or sinus tenderness. Throat: lips, mucosa, and tongue normal; teeth and gums normal Neck: no adenopathy, no carotid bruit, no JVD, supple, symmetrical, trachea midline and thyroid not enlarged, symmetric, no tenderness/mass/nodules Back: symmetric, no curvature. ROM normal. No CVA tenderness. Resp: clear to auscultation bilaterally Chest wall: no tenderness Cardio: regular rate and rhythm, S1, S2 normal, no murmur, click, rub or gallop GI: soft, non-tender; bowel sounds normal; no masses,  no organomegaly Extremities: extremities normal, atraumatic, no cyanosis or edema Pulses: 2+ and symmetric Skin: Skin color, texture, turgor normal. No rashes or lesions Lymph nodes: Cervical, supraclavicular, and axillary nodes normal. Neurologic: Grossly normal Labs:   Lab Results  Component Value Date   WBC 5.0 07/06/2017   HGB 12.2 (L) 07/06/2017   HCT 36.4 (L) 07/06/2017    MCV 95.8 07/06/2017   PLT 162 07/06/2017   No results for input(s): NA, K, CL, CO2, BUN, CREATININE, CALCIUM, PROT, BILITOT, ALKPHOS, ALT, AST, GLUCOSE in the last 168 hours.  Invalid input(s): LABALBU    Radiology: No pneumothorax EKG: Atrial paced   FOLLOW UP PLANS AND APPOINTMENTS  Allergies as of 07/14/2017      Reactions   Celebrex [celecoxib]    Noxius taste in mouth   Mirtazapine    Bad taste in mouth. Refuses to take again      Medication List    TAKE these medications   atorvastatin 80 MG tablet Commonly known as:  LIPITOR TAKE 1 TABLET DAILY   cephALEXin 250 MG capsule Commonly known as:  KEFLEX Take 2 capsules (500 mg total) by mouth 2 (two) times daily.   clopidogrel 75 MG tablet Commonly known as:  PLAVIX TAKE 1 TABLET BY MOUTH DAILY   enalapril 5 MG tablet Commonly known as:  VASOTEC TAKE ONE TABLET BY MOUTH DAILY   fluticasone 50 MCG/ACT nasal spray Commonly known as:  FLONASE PLACE 2 SPRAYS INTO BOTH NOSTRILS DAILY.   furosemide 20 MG tablet Commonly known as:  LASIX Take 0.5 tablets (10 mg total) by mouth daily.   guaiFENesin-codeine 100-10 MG/5ML syrup Commonly known as:  CHERATUSSIN AC TAKE 1-2 TEASPOONS EVERY 6 HOURS AS NEEDED FOR COUGH   HYDROcodone-acetaminophen 7.5-325 MG tablet Commonly known as:  NORCO 1 tablet every 4-6 hours as needed   hydrocortisone 2.5 % cream APPLY TO AFFECTED AREA(S) TWO TIMES A DAY AS NEEDED   latanoprost 0.005 % ophthalmic solution Commonly known as:  XALATAN Place 1 drop into both eyes at bedtime.   metoprolol succinate 25 MG 24 hr tablet Commonly known as:  TOPROL-XL Take 1 tablet (25 mg total) by mouth daily.  nitroGLYCERIN 0.4 MG SL tablet Commonly known as:  NITROSTAT Place 1 tablet (0.4 mg total) under the tongue every 5 (five) minutes as needed for chest pain.   ranitidine 300 MG tablet Commonly known as:  ZANTAC TAKE 1 TABLET TWICE DAILY   testosterone cypionate 200 MG/ML  injection Commonly known as:  DEPOTESTOSTERONE CYPIONATE Inject 1 mL (200 mg total) into the muscle every 28 (twenty-eight) days.   zolpidem 10 MG tablet Commonly known as:  AMBIEN TAKE 1/2-1 TABLET EVERY NIGHT AT BEDTIME      Follow-up Information    Brigitte Soderberg, MD Follow up in 1 week(s).   Specialty:  Cardiology Contact information: [facility] [place] [address] [phone]           BRING ALL MEDICATIONS WITH YOU TO FOLLOW UP APPOINTMENTS  Time spent with patient to include physician time: 25 minutes Signed:  Isaias Cowman MD, PhD, [facility] 07/14/2017, 7:33 AM

## 2017-07-14 NOTE — Plan of Care (Signed)
  Adequate for Discharge Education: Knowledge of General Education information will improve 07/14/2017 0910 - Adequate for Discharge by Feliberto Gottron, [place] Behavior/Discharge Planning: Ability to manage health-related needs will improve 07/14/2017 0910 - Adequate for Discharge by Feliberto Gottron, RN Clinical Measurements: Ability to maintain clinical measurements within normal limits will improve 07/14/2017 0910 - Adequate for Discharge by Chriss Czar, Illene Bolus, RN Will remain free from infection 07/14/2017 0910 - Adequate for Discharge by Feliberto Gottron, RN Diagnostic test results will improve 07/14/2017 0910 - Adequate for Discharge by Feliberto Gottron, RN Respiratory complications will improve 07/14/2017 0910 - Adequate for Discharge by Feliberto Gottron, RN Cardiovascular complication will be avoided 07/14/2017 0910 - Adequate for Discharge by Chriss Czar, Illene Bolus, RN Activity: Risk for activity intolerance will decrease 07/14/2017 0910 - Adequate for Discharge by Feliberto Gottron, RN Nutrition: Adequate nutrition will be maintained 07/14/2017 0910 - Adequate for Discharge by Feliberto Gottron, RN Coping: Level of anxiety will decrease 07/14/2017 0910 - Adequate for Discharge by Chriss Czar, Illene Bolus, RN Elimination: Will not experience complications related to bowel motility 07/14/2017 0910 - Adequate for Discharge by Chriss Czar, Illene Bolus, RN Will not experience complications related to urinary retention 07/14/2017 0910 - Adequate for Discharge by Feliberto Gottron, RN Pain Managment: General experience of comfort will improve 07/14/2017 0910 - Adequate for Discharge by Feliberto Gottron, RN Safety: Ability to remain free from injury will improve 07/14/2017 0910 - Adequate for Discharge by Feliberto Gottron, RN Skin Integrity: Risk for impaired skin integrity will decrease 07/14/2017 0910  - Adequate for Discharge by Feliberto Gottron, RN Education: Knowledge of cardiac device and self-care will improve 07/14/2017 0910 - Adequate for Discharge by Feliberto Gottron, RN Ability to safely manage health related needs after discharge will improve 07/14/2017 0910 - Adequate for Discharge by Feliberto Gottron, RN Cardiac: Ability to achieve and maintain adequate cardiopulmonary perfusion will improve 07/14/2017 0910 - Adequate for Discharge by Feliberto Gottron, RN

## 2017-07-14 NOTE — Progress Notes (Signed)
Patient transported via wheelchair to family vehicle.

## 2017-07-14 NOTE — Discharge Instructions (Signed)
Do not lift left arm above head. May shower 07/14/2017. Resume Plavix ( clopidrogrel ) 07/14/2017.

## 2017-07-14 NOTE — Progress Notes (Signed)
Patient given discharge instructions with son at bedside. Prescriptions given as well. Both IV's taken out and tele monitor off. Patient and son verbalized understanding with all questions answered. Patient will be transported home in family vehicle.

## 2017-07-14 NOTE — Anesthesia Postprocedure Evaluation (Signed)
Anesthesia Post Note  Patient: Ivan Salazar  Procedure(s) Performed: INSERTION PACEMAKER (N/A )  Patient location during evaluation: PACU Anesthesia Type: MAC Level of consciousness: awake and alert Pain management: pain level controlled Vital Signs Assessment: post-procedure vital signs reviewed and stable Respiratory status: spontaneous breathing, nonlabored ventilation and respiratory function stable Cardiovascular status: blood pressure returned to baseline and stable Postop Assessment: no apparent nausea or vomiting Anesthetic complications: no     Last Vitals:  Vitals:   07/13/17 1945 07/14/17 0513  BP: (!) 164/67 (!) 154/53  Pulse: 61 (!) 58  Resp:    Temp:  36.8 C  SpO2: 100% 100%    Last Pain:  Vitals:   07/14/17 0557  TempSrc:   PainSc: 3                  Rasool Rommel Harvie Heck

## 2017-07-14 NOTE — Care Management Note (Signed)
Case Management Note  Patient Details  Name: Ivan Salazar MRN: [id number] Date of Birth: [birth year]  Subjective/Objective:    Admitted to [facility] under observation status for pacemaker. Lives alone, Son is Nicki Reaper [phone]). Seen Dr, Caryn Section last week, Prescriptions are filled at [facility]. No home Health. No skilled facility. No home oxygen,. No medical equipment in the home. Takes care of all basic and instrumental activities of daily living himself, drives. No falls. Good appetite. Family will transport                Action/Plan: No follow-up needs identified Discharge to home today,   Expected Discharge Date:  07/14/17               Expected Discharge Plan:     In-House Referral:     Discharge planning Services     Post Acute Care Choice:    Choice offered to:     DME Arranged:    DME Agency:     HH Arranged:    HH Agency:     Status of Service:     If discussed at [employer], dates discussed:    Additional Comments:  Shelbie Ammons, RN MSN CCM Care Management [phone] 07/14/2017, 9:26 AM

## 2017-07-20 ENCOUNTER — Telehealth: Payer: Self-pay | Admitting: Family Medicine

## 2017-07-20 DIAGNOSIS — B351 Tinea unguium: Secondary | ICD-10-CM | POA: Diagnosis not present

## 2017-07-20 DIAGNOSIS — M79675 Pain in left toe(s): Secondary | ICD-10-CM | POA: Diagnosis not present

## 2017-07-20 DIAGNOSIS — M79674 Pain in right toe(s): Secondary | ICD-10-CM | POA: Diagnosis not present

## 2017-07-20 MED ORDER — AZITHROMYCIN 250 MG PO TABS: ORAL_TABLET | ORAL | 0 refills | Status: AC

## 2017-07-20 NOTE — Telephone Encounter (Signed)
Tried calling patient. Left message to call back. 

## 2017-07-20 NOTE — Telephone Encounter (Signed)
Please advise 

## 2017-07-20 NOTE — Telephone Encounter (Signed)
Patient is requesting a refill for the following medication  azithromycin (ZITHROMAX) 250 MG tablet  He states that his chest cough has started back after having surgery for pace maker.  He uses [facility].

## 2017-07-20 NOTE — Telephone Encounter (Signed)
Prescription has been sent. He needs office visit if any fever, chills, sweats, or shortness of breath.

## 2017-07-21 DIAGNOSIS — R0602 Shortness of breath: Secondary | ICD-10-CM | POA: Diagnosis not present

## 2017-07-21 DIAGNOSIS — Z9889 Other specified postprocedural states: Secondary | ICD-10-CM | POA: Diagnosis not present

## 2017-07-21 DIAGNOSIS — R609 Edema, unspecified: Secondary | ICD-10-CM | POA: Diagnosis not present

## 2017-07-21 DIAGNOSIS — I2510 Atherosclerotic heart disease of native coronary artery without angina pectoris: Secondary | ICD-10-CM | POA: Diagnosis not present

## 2017-07-21 DIAGNOSIS — I495 Sick sinus syndrome: Secondary | ICD-10-CM | POA: Diagnosis not present

## 2017-07-21 DIAGNOSIS — Z950 Presence of cardiac pacemaker: Secondary | ICD-10-CM | POA: Diagnosis not present

## 2017-07-21 DIAGNOSIS — Z951 Presence of aortocoronary bypass graft: Secondary | ICD-10-CM | POA: Diagnosis not present

## 2017-07-21 NOTE — Telephone Encounter (Signed)
Advised patient as below.  

## 2017-07-28 ENCOUNTER — Ambulatory Visit (INDEPENDENT_AMBULATORY_CARE_PROVIDER_SITE_OTHER): Payer: Medicare Other | Admitting: Family Medicine

## 2017-07-28 ENCOUNTER — Encounter: Payer: Self-pay | Admitting: Family Medicine

## 2017-07-28 VITALS — BP 122/60 | HR 68 | Temp 98.0°F | Resp 20 | Wt 147.2 lb

## 2017-07-28 DIAGNOSIS — R05 Cough: Secondary | ICD-10-CM

## 2017-07-28 DIAGNOSIS — I2510 Atherosclerotic heart disease of native coronary artery without angina pectoris: Secondary | ICD-10-CM | POA: Diagnosis not present

## 2017-07-28 DIAGNOSIS — R609 Edema, unspecified: Secondary | ICD-10-CM

## 2017-07-28 DIAGNOSIS — R053 Chronic cough: Secondary | ICD-10-CM

## 2017-07-28 DIAGNOSIS — Z950 Presence of cardiac pacemaker: Secondary | ICD-10-CM | POA: Diagnosis not present

## 2017-07-28 IMAGING — CR DG RIBS W/ CHEST 3+V*R*
4 series · 4 of 4 positions shown · non-contrast
Comparison: CXR 08/30/2016

CLINICAL DATA: Right rib pain after tripping over a curb this
evening

EXAM:
RIGHT RIBS AND CHEST - 3+ VIEW

[chest pa]
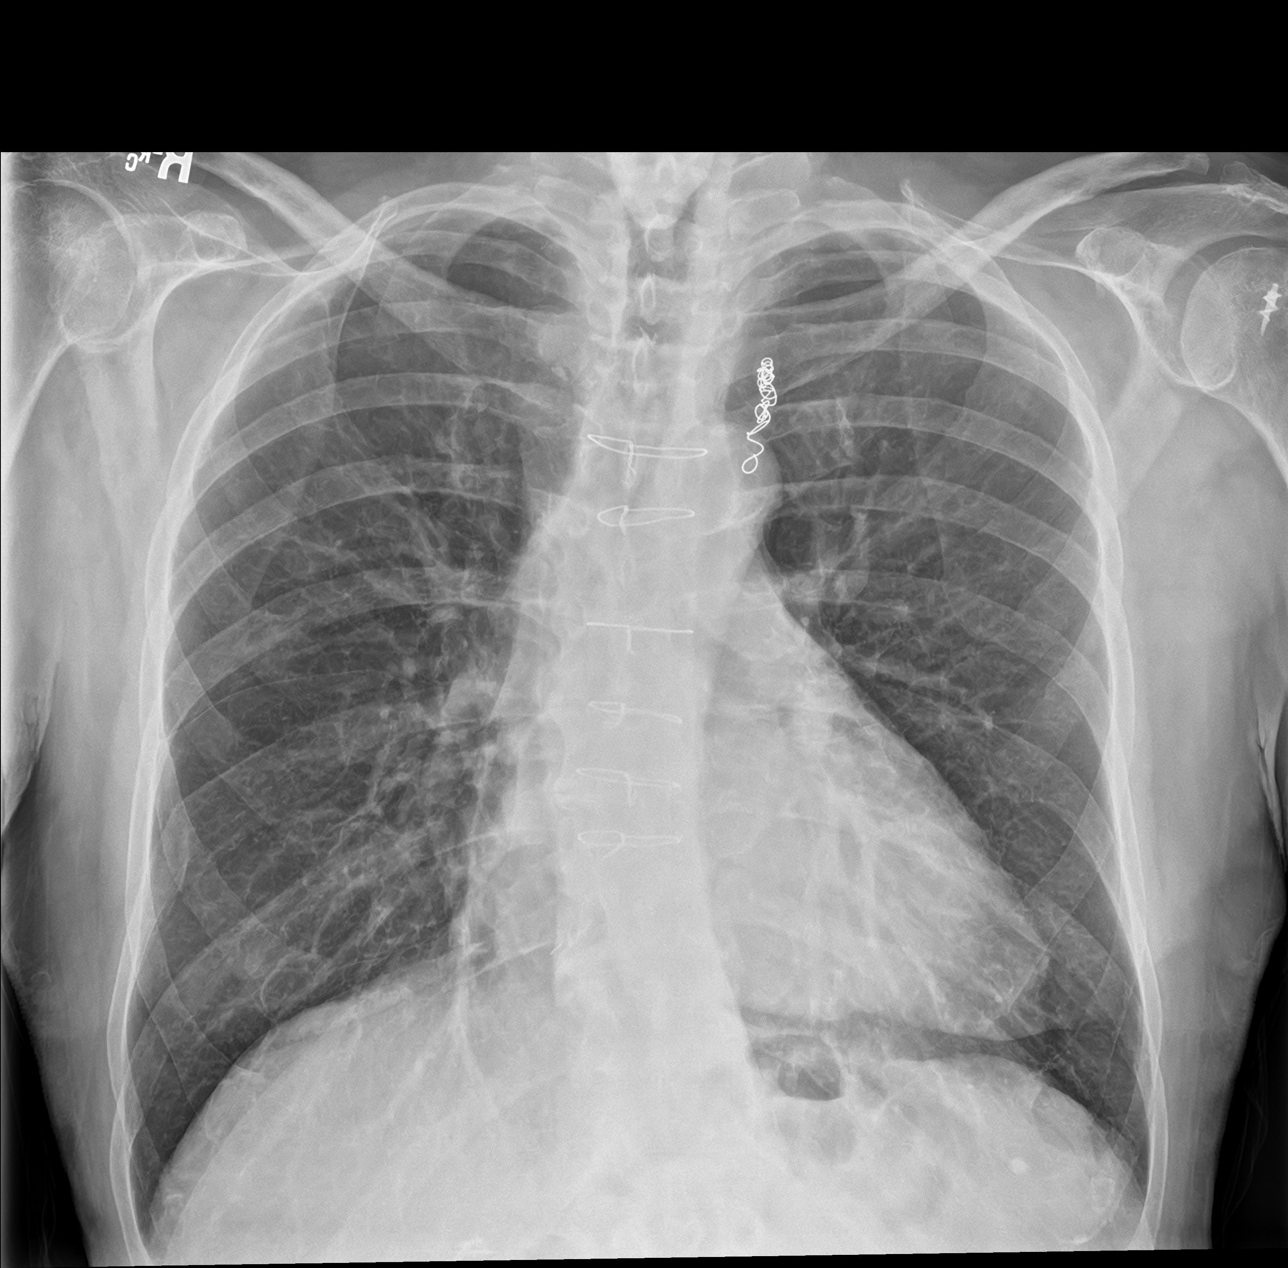

[rib pa]
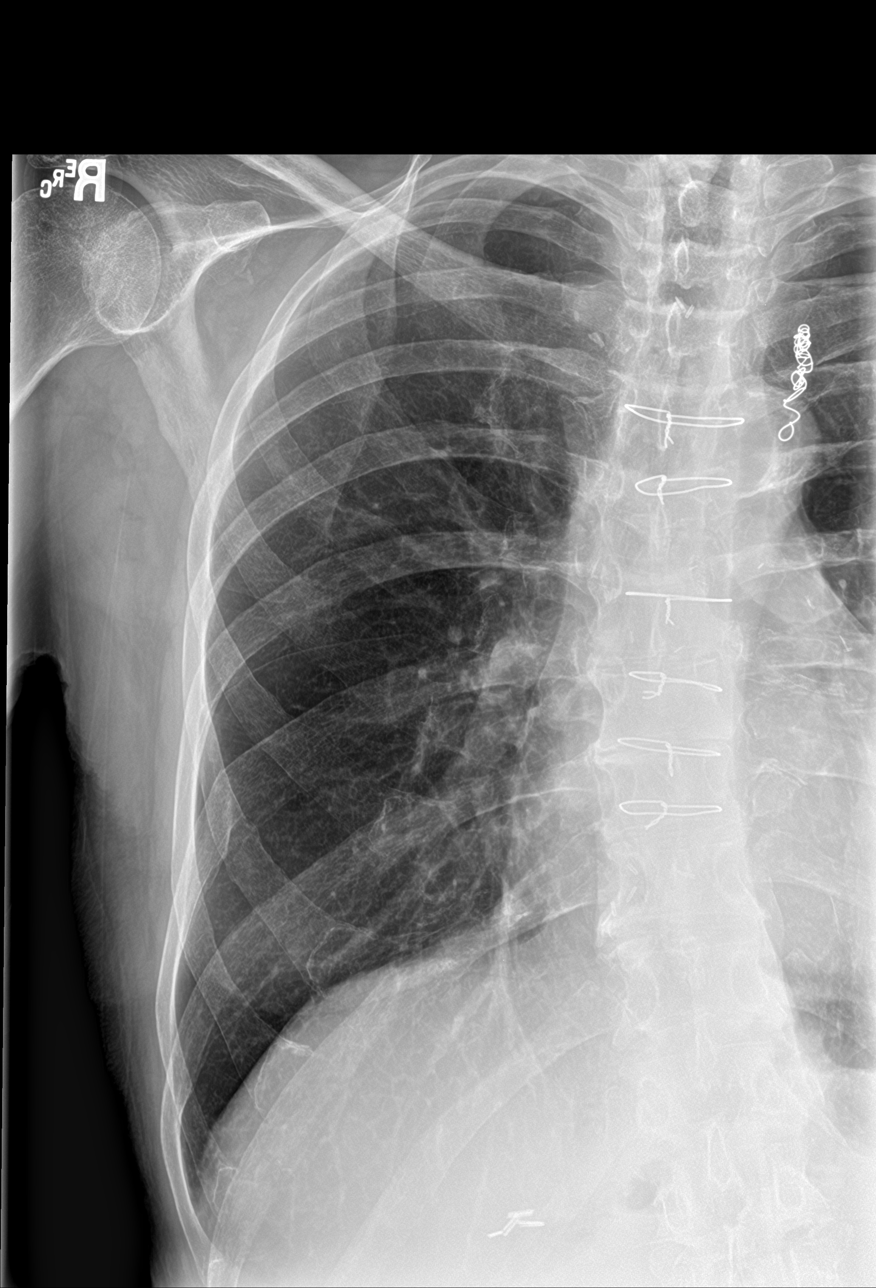

[rib pa obl]
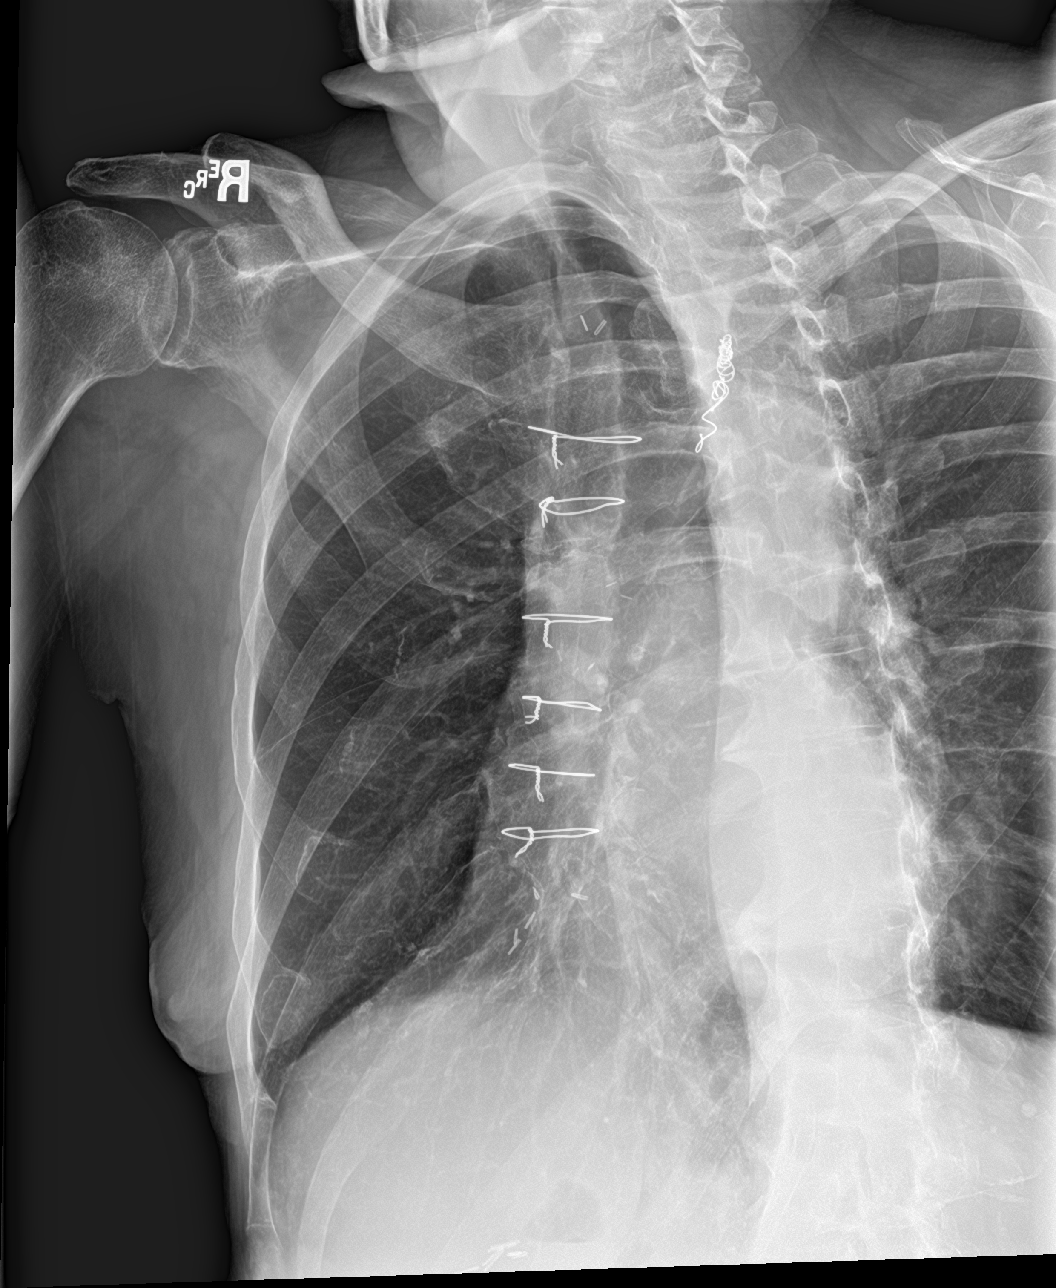

[rib ap]
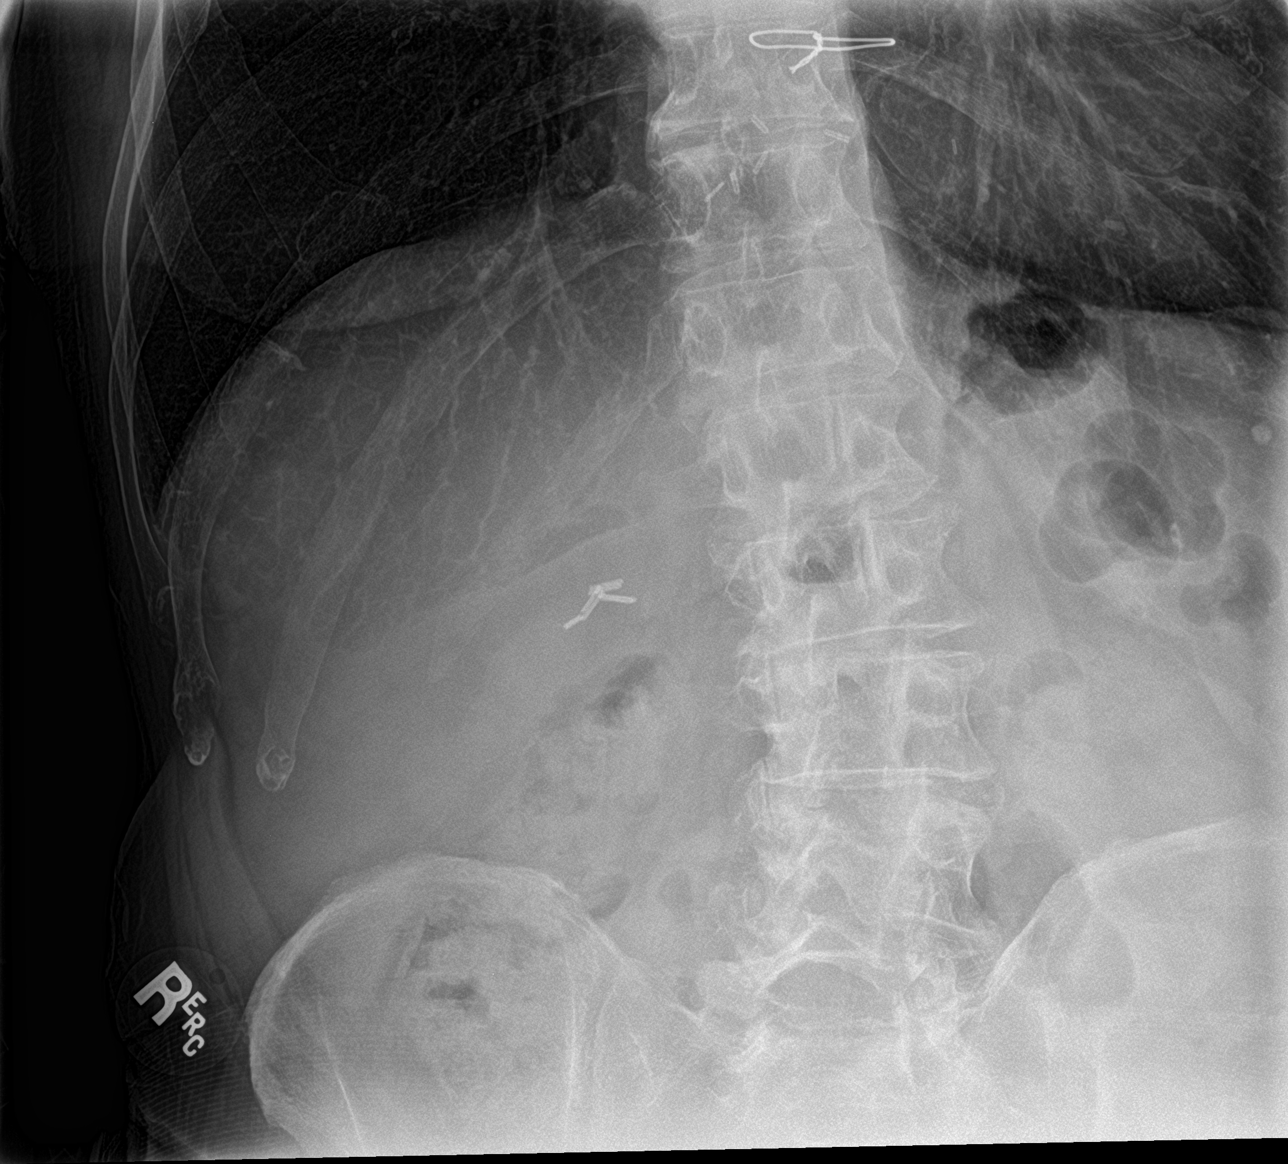

[4 of 4 positions shown; findings below may reference images not displayed]

FINDINGS: Chronic fracture deformity of the right eighth rib. No acute rib
fracture identified. Median sternotomy sutures are noted with
embolization coils projecting over the left upper lobe. Surgical
anchor noted of the left humeral head. Two vascular clips also
project over the superior mediastinum. Status post cholecystectomy.

The heart is borderline enlarged with aortic atherosclerosis. The
lungs are free of pneumonic consolidation. No overt pulmonary edema.
No pneumothorax.
IMPRESSION: 1. Chronic right posterior eighth rib fracture. No acute osseous
abnormality.
2. Aortic atherosclerosis.  No acute pulmonary disease.

## 2017-07-28 IMAGING — CT CT HEAD W/O CM
5 of 7 series · 17 of 47 positions shown, 18 images · non-contrast
Comparison: None.

CLINICAL DATA: Status post fall with right eye bruising. On Plavix.

EXAM:
CT HEAD WITHOUT CONTRAST
CT CERVICAL SPINE WITHOUT CONTRAST
TECHNIQUE: Multidetector CT imaging of the head and cervical spine was
performed following the standard protocol without intravenous
contrast. Multiplanar CT image reconstructions of the cervical spine
were also generated.

[Series 2: head wo · axial · 0.45mm/px · z∈[-43,+7]mm · 2 of 30 slices shown, 3 images]
[im 10/30  brain]
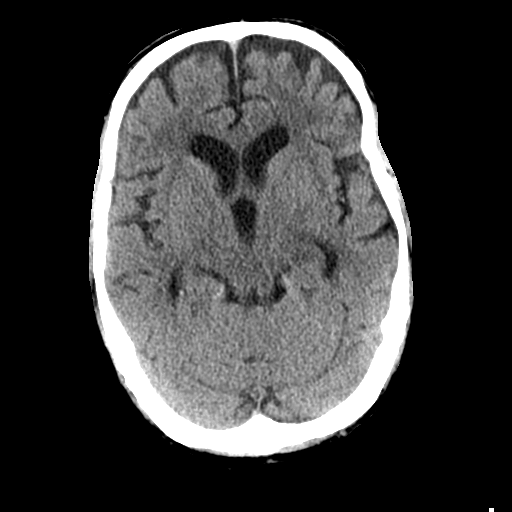
[im 10/30  bone]
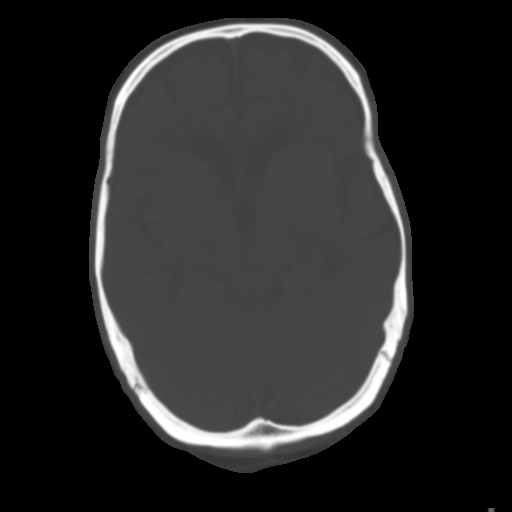
[im 20/30  brain]
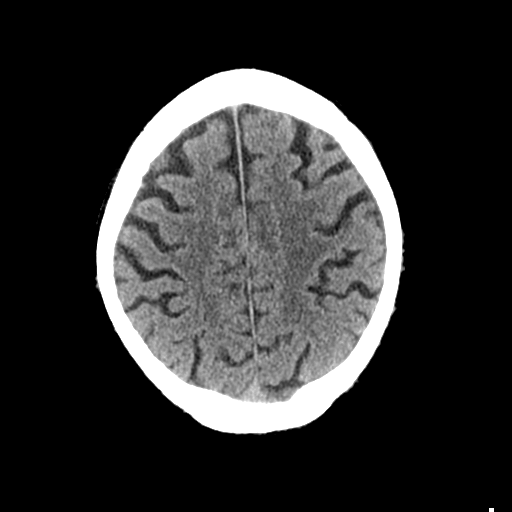

[Series 4: coronal soft tissue · coronal · 0.29mm/px · 3 of 67 slices shown]
[im 23/67  brain]
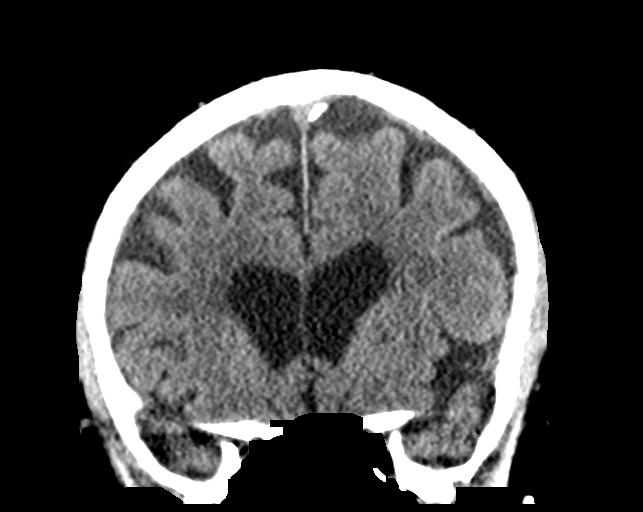
[im 34/67  brain]
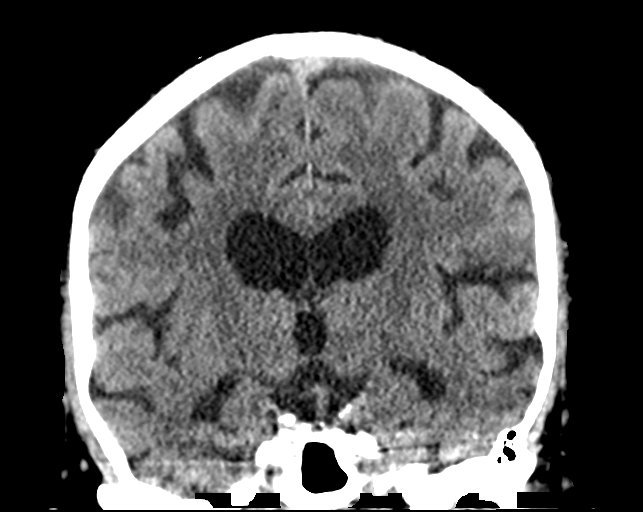
[im 45/67  brain]
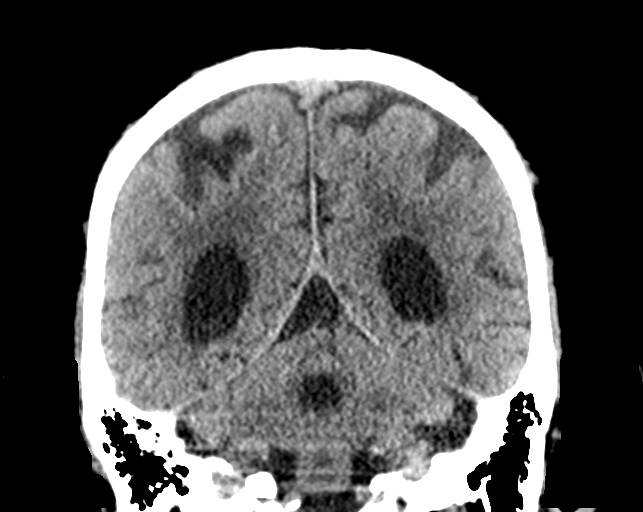

[Series 5: sagittal soft tissue · sagittal · 0.29mm/px · 1 of 53 slices shown]
[im 27/53  brain]
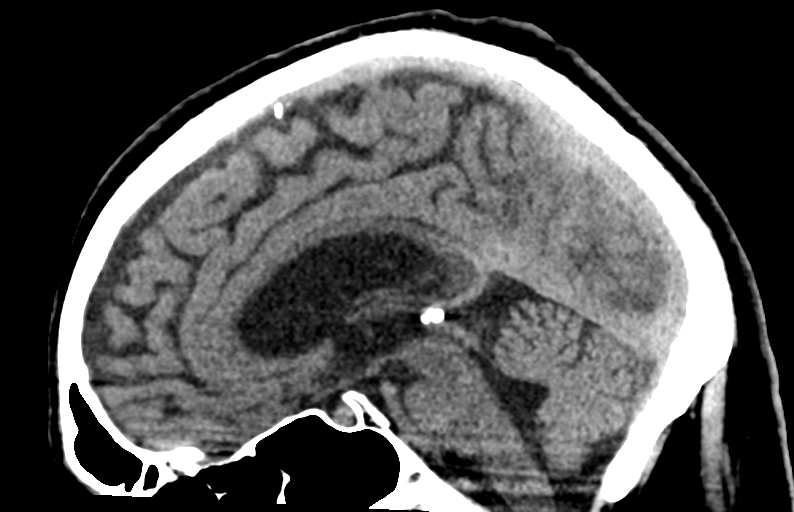

[Series 7: c spine soft · axial · 0.40mm/px · z∈[-214,-170]mm · 3 of 89 slices shown]
[im 8/89  brain]
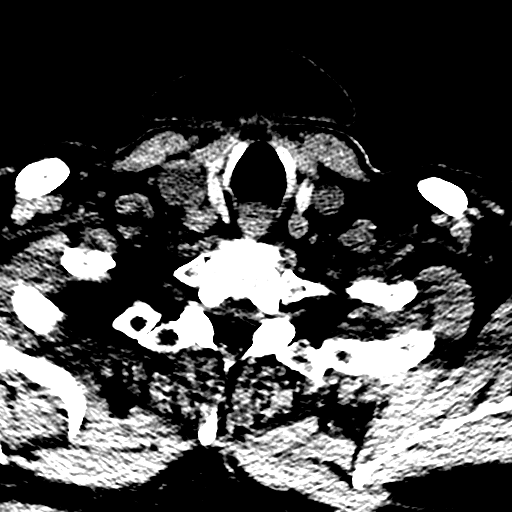
[im 23/89  brain]
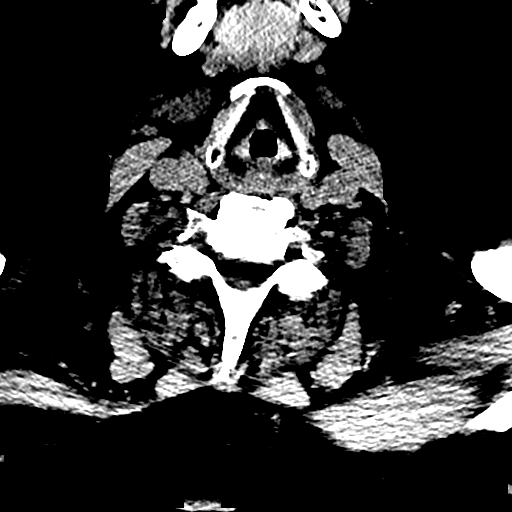
[im 30/89  brain]
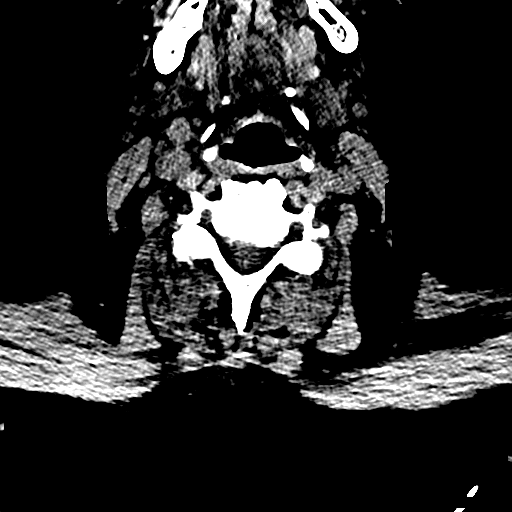

[Series 10: orthogonal bone · axial · 0.23mm/px · z∈[-233,-117]mm · 8 of 87 slices shown]
[im 8/87  bone]
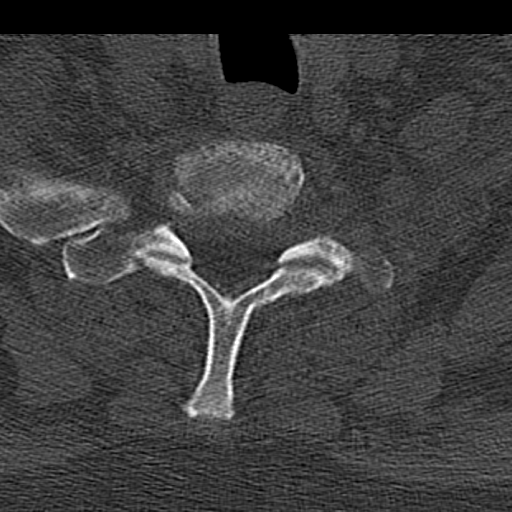
[im 22/87  bone]
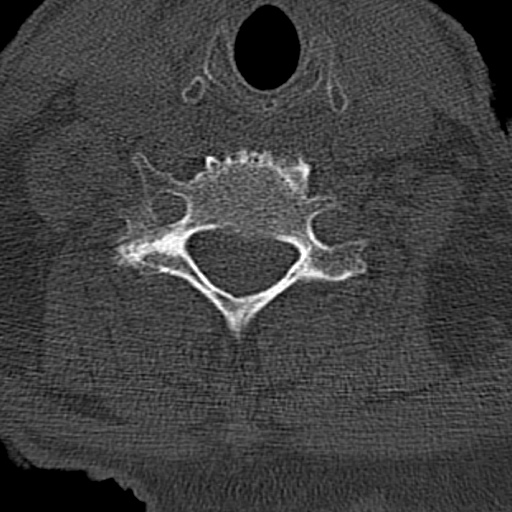
[im 29/87  bone]
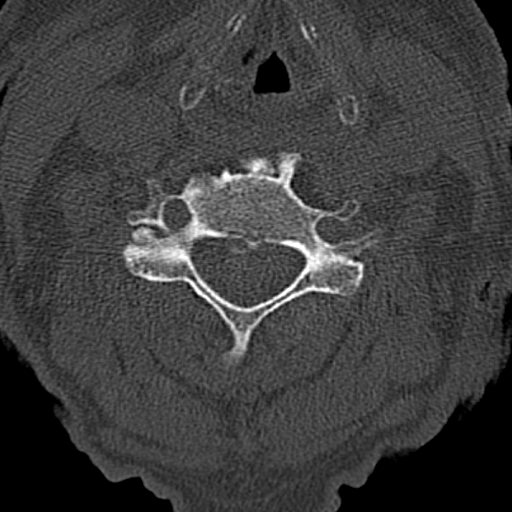
[im 36/87  bone]
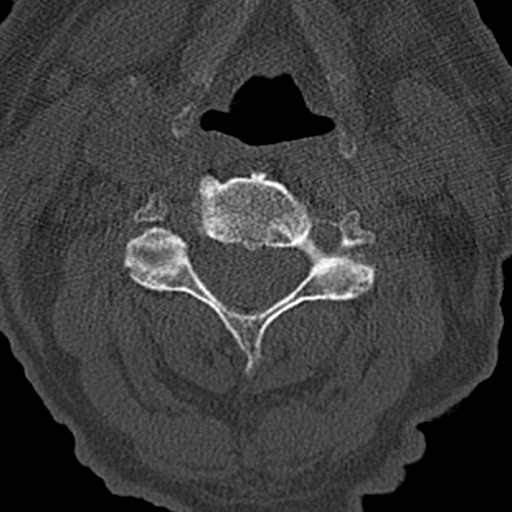
[im 51/87  bone]
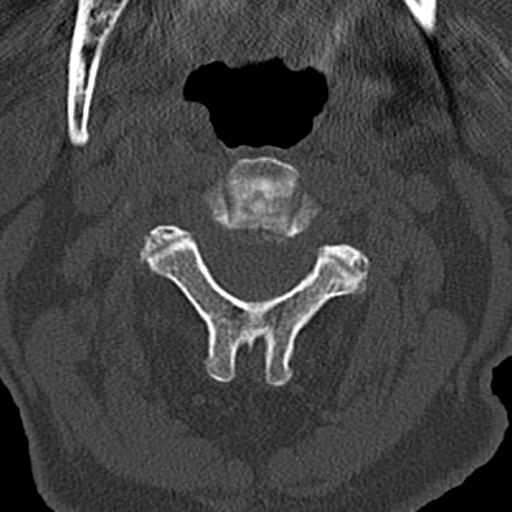
[im 58/87  bone]
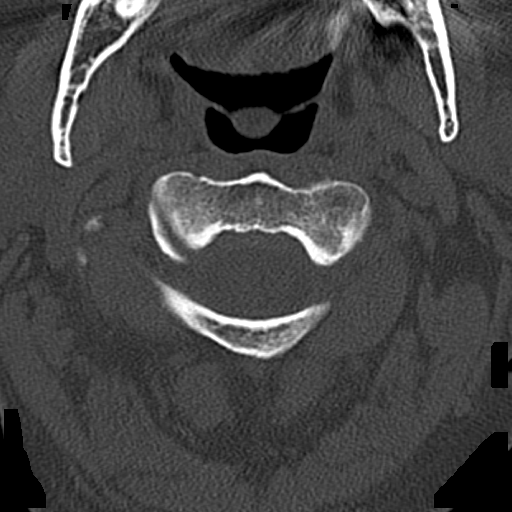
[im 65/87  bone]
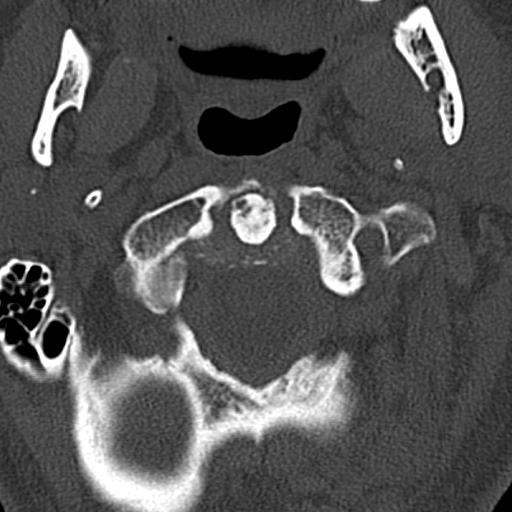
[im 79/87  bone]
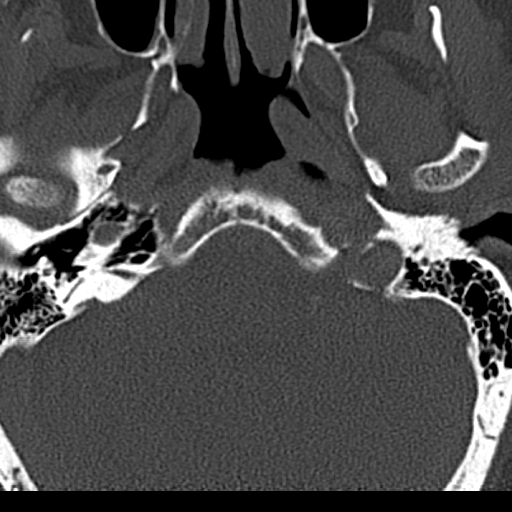

[17 of 47 positions shown; findings below may reference images not displayed]

FINDINGS: CT HEAD FINDINGS

Brain: No evidence of acute infarction, hemorrhage, hydrocephalus,
extra-axial collection or mass lesion/mass effect. Moderate brain
parenchymal volume loss and microangiopathy.

Vascular: Calcific atherosclerotic disease at the skullbase.

Skull: Normal. Negative for fracture or focal lesion.

Sinuses/Orbits: No acute finding.

Other: None.

CT CERVICAL SPINE FINDINGS

Alignment: Straightening of cervical lordosis.

Skull base and vertebrae: No acute fracture. No primary bone lesion
or focal pathologic process.

Soft tissues and spinal canal: No prevertebral fluid or swelling. No
visible canal hematoma.

Disc levels: Multilevel osteoarthritic changes with disc space
narrowing, endplate sclerosis, large osteophyte formation.
Ligamentous calcifications also noted.

Upper chest: Negative.
IMPRESSION: No acute intracranial abnormality.

Moderate brain parenchymal atrophy and chronic microvascular
disease.

No evidence of traumatic injury to cervical spine.

Multilevel osteoarthritic changes with straightening of cervical
lordosis.

## 2017-07-28 MED ORDER — FUROSEMIDE 20 MG PO TABS: 10.0000 mg | ORAL_TABLET | Freq: Every day | ORAL | 0 refills | Status: DC | PRN

## 2017-07-28 MED ORDER — GUAIFENESIN-CODEINE 100-10 MG/5ML PO SYRP: ORAL_SOLUTION | ORAL | 3 refills | Status: DC

## 2017-07-28 NOTE — Progress Notes (Signed)
Patient: Ivan Salazar Male    DOB: 03/03/1932   84 y.o.   MRN: [id number] Visit Date: 07/28/2017  Today's Provider: Lelon Huh, MD   Chief Complaint  Patient presents with  . Edema  . Cough   Subjective:    HPI  Cough From 07/20/2017-patient called office and requested a medication for cough. Patient was prescribed Zpack and reports cough has resolved, he does have occasional chronic cough for which he takes Cherristussin [facility] and requests refill today.   Edema, unspecified type From 06/27/2017-advised to continue 1/2 x 20mg  furosemide daily. Since then he has had pacemaker place by Dr. Saralyn Pilar and reports dyspnea and edema have resolved. He stopped taking furosemide 2 days ago after noting he was continuing to lose weight . States his breathing has been fine ever since.  Wt Readings from Last 5 Encounters:  07/28/17 147 lb 3.2 oz (66.8 kg)  07/13/17 159 lb 3.2 oz (72.2 kg)  07/06/17 160 lb (72.6 kg)  06/27/17 154 lb (69.9 kg)  06/21/17 154 lb (69.9 kg)     Allergies  Allergen Reactions  . Celebrex [Celecoxib]     Noxius taste in mouth  . Mirtazapine     Bad taste in mouth. Refuses to take again     Current Outpatient Medications:  .  atorvastatin (LIPITOR) 80 MG tablet, TAKE 1 TABLET DAILY, Disp: 90 tablet, Rfl: 4 .  clopidogrel (PLAVIX) 75 MG tablet, TAKE 1 TABLET BY MOUTH DAILY, Disp: 90 tablet, Rfl: 4 .  enalapril (VASOTEC) 5 MG tablet, TAKE ONE TABLET BY MOUTH DAILY, Disp: 90 tablet, Rfl: 4 .  fluticasone (FLONASE) 50 MCG/ACT nasal spray, PLACE 2 SPRAYS INTO BOTH NOSTRILS DAILY., Disp: 17 g, Rfl: 5 .  furosemide (LASIX) 20 MG tablet, Take 0.5 tablets (10 mg total) by mouth daily., Disp: 30 tablet, Rfl: 2 .  guaiFENesin-codeine (CHERATUSSIN AC) 100-10 MG/5ML syrup, TAKE 1-2 TEASPOONS EVERY 6 HOURS AS NEEDED FOR COUGH, Disp: 180 mL, Rfl: 3 .  HYDROcodone-acetaminophen (NORCO) 7.5-325 MG tablet, 1 tablet every 4-6 hours as needed, Disp: 180 tablet, Rfl: 0 .   latanoprost (XALATAN) 0.005 % ophthalmic solution, Place 1 drop into both eyes at bedtime. , Disp: , Rfl:  .  metoprolol succinate (TOPROL-XL) 25 MG 24 hr tablet, Take 1 tablet (25 mg total) by mouth daily., Disp: 30 tablet, Rfl: 1 .  ranitidine (ZANTAC) 300 MG tablet, TAKE 1 TABLET TWICE DAILY, Disp: 180 tablet, Rfl: 3 .  testosterone cypionate (DEPOTESTOSTERONE CYPIONATE) 200 MG/ML injection, Inject 1 mL (200 mg total) into the muscle every 28 (twenty-eight) days., Disp: 10 mL, Rfl: 0 .  zolpidem (AMBIEN) 10 MG tablet, TAKE 1/2-1 TABLET EVERY NIGHT AT BEDTIME, Disp: 30 tablet, Rfl: 5 .  cephALEXin (KEFLEX) 250 MG capsule, Take 2 capsules (500 mg total) by mouth 2 (two) times daily. (Patient not taking: Reported on 07/28/2017), Disp: 14 capsule, Rfl: 0 .  hydrocortisone 2.5 % cream, APPLY TO AFFECTED AREA(S) TWO TIMES A DAY AS NEEDED (Patient not taking: Reported on 07/13/2017), Disp: 30 g, Rfl: 5 .  nitroGLYCERIN (NITROSTAT) 0.4 MG SL tablet, Place 1 tablet (0.4 mg total) under the tongue every 5 (five) minutes as needed for chest pain. (Patient not taking: Reported on 07/13/2017), Disp: 20 tablet, Rfl: 1  Review of Systems  Constitutional: Negative for appetite change, chills and fever.  Respiratory: Positive for cough. Negative for chest tightness, shortness of breath and wheezing.   Cardiovascular: Positive for leg  swelling. Negative for chest pain and palpitations.  Gastrointestinal: Negative for abdominal pain, nausea and vomiting.    Social History   Tobacco Use  . Smoking status: Former Smoker    Packs/day: 0.50    Years: 20.00    Pack years: 10.00    Last attempt to quit: 05/01/1977    Years since quitting: 40.2  . Smokeless tobacco: Never Used  Substance Use Topics  . Alcohol use: Yes    Alcohol/week: 8.4 oz    Types: 14 Shots of liquor per week    Comment: moderate; 2 drinks daily   Objective:   BP 122/60 (BP Location: Left Arm, Patient Position: Sitting, Cuff Size: Normal)    Pulse 68   Temp 98 F (36.7 C)   Resp 20   Wt 147 lb 3.2 oz (66.8 kg)   SpO2 98%   BMI 21.12 kg/m     Physical Exam   General Appearance:    Alert, cooperative, no distress  Eyes:    PERRL, conjunctiva/corneas clear, EOM's intact       Lungs:     Clear to auscultation bilaterally, respirations unlabored  Heart:    Regular rate and rhythm  Ext:   No edema.           Assessment & Plan:     1. Status post cardiac pacemaker procedure Edema and dyspnea resolved since placement. He is very happy with outcome. Recommend he weigh himself every day and take furosemide if there is an overnight weight gain, or he notices any edema or dyspnea on exertion.   2. Edema, unspecified type Resolved.   3. Chronic cough refill- guaiFENesin-codeine (CHERATUSSIN AC) 100-10 MG/5ML syrup; TAKE 1-2 TEASPOONS EVERY 6 HOURS AS NEEDED FOR COUGH  Dispense: 180 mL; Refill: 3  Return in about 3 months (around 10/28/2017).       Lelon Huh, MD  [facility]

## 2017-08-02 ENCOUNTER — Other Ambulatory Visit: Payer: Self-pay | Admitting: Urology

## 2017-08-02 ENCOUNTER — Ambulatory Visit: Payer: Medicare Other

## 2017-08-02 DIAGNOSIS — E291 Testicular hypofunction: Secondary | ICD-10-CM

## 2017-08-03 ENCOUNTER — Ambulatory Visit: Payer: Medicare Other

## 2017-08-04 ENCOUNTER — Other Ambulatory Visit: Payer: Self-pay

## 2017-08-04 ENCOUNTER — Other Ambulatory Visit: Payer: Self-pay | Admitting: Family Medicine

## 2017-08-04 DIAGNOSIS — E291 Testicular hypofunction: Secondary | ICD-10-CM

## 2017-08-04 MED ORDER — TESTOSTERONE CYPIONATE 200 MG/ML IM SOLN: 200.0000 mg | INTRAMUSCULAR | 1 refills | Status: DC

## 2017-08-07 ENCOUNTER — Other Ambulatory Visit: Payer: Self-pay | Admitting: Family Medicine

## 2017-08-07 ENCOUNTER — Ambulatory Visit (INDEPENDENT_AMBULATORY_CARE_PROVIDER_SITE_OTHER): Payer: Medicare Other

## 2017-08-07 DIAGNOSIS — E291 Testicular hypofunction: Secondary | ICD-10-CM | POA: Diagnosis not present

## 2017-08-07 DIAGNOSIS — I2510 Atherosclerotic heart disease of native coronary artery without angina pectoris: Secondary | ICD-10-CM

## 2017-08-07 DIAGNOSIS — M5137 Other intervertebral disc degeneration, lumbosacral region: Secondary | ICD-10-CM

## 2017-08-07 MED ORDER — METOPROLOL SUCCINATE ER 25 MG PO TB24: 25.0000 mg | ORAL_TABLET | Freq: Every day | ORAL | 4 refills | Status: DC

## 2017-08-07 MED ORDER — TESTOSTERONE CYPIONATE 200 MG/ML IM SOLN
200.0000 mg | Freq: Once | INTRAMUSCULAR | Status: AC
Start: 2017-08-07 — End: 2017-08-07
  Administered 2017-08-07: 200 mg via INTRAMUSCULAR

## 2017-08-07 NOTE — Telephone Encounter (Signed)
Pt called saying he needs a new rx for metoprolol 25 mg  He wants 90 day with three refills  He uses [facility]  His call back is [phone]  Thanks  teri

## 2017-08-07 NOTE — Progress Notes (Signed)
Testosterone IM Injection  Due to Hypogonadism patient is present today for a Testosterone Injection.  Medication: Testosterone Cypionate Dose: 78mL Location: right upper outer buttocks Lot: [id number] Exp:12/2018  Patient tolerated well, no complications were noted  Preformed by: Toniann Fail, LPN   Follow up: 1 month

## 2017-09-01 ENCOUNTER — Encounter: Payer: Self-pay | Admitting: Family Medicine

## 2017-09-01 ENCOUNTER — Ambulatory Visit (INDEPENDENT_AMBULATORY_CARE_PROVIDER_SITE_OTHER): Payer: Medicare Other | Admitting: Family Medicine

## 2017-09-01 ENCOUNTER — Telehealth: Payer: Self-pay | Admitting: Family Medicine

## 2017-09-01 VITALS — BP 120/60 | HR 76 | Temp 98.3°F | Resp 16 | Wt 146.0 lb

## 2017-09-01 DIAGNOSIS — M47812 Spondylosis without myelopathy or radiculopathy, cervical region: Secondary | ICD-10-CM

## 2017-09-01 DIAGNOSIS — R609 Edema, unspecified: Secondary | ICD-10-CM | POA: Diagnosis not present

## 2017-09-01 DIAGNOSIS — I2510 Atherosclerotic heart disease of native coronary artery without angina pectoris: Secondary | ICD-10-CM

## 2017-09-01 DIAGNOSIS — M5137 Other intervertebral disc degeneration, lumbosacral region: Secondary | ICD-10-CM | POA: Diagnosis not present

## 2017-09-01 DIAGNOSIS — R0609 Other forms of dyspnea: Secondary | ICD-10-CM

## 2017-09-01 MED ORDER — HYDROCODONE-ACETAMINOPHEN 7.5-325 MG PO TABS: ORAL_TABLET | ORAL | 0 refills | Status: DC

## 2017-09-01 NOTE — Telephone Encounter (Signed)
Pt stated he got Felicia's message about scheduling AWV. Pt stated that he will call back to schedule the appt when he is ready. Thanks TNP

## 2017-09-01 NOTE — Progress Notes (Signed)
Patient: Ivan Salazar Male    DOB: Jun 18, 1931   84 y.o.   MRN: [id number] Visit Date: 09/01/2017  Today's Provider: Lelon Huh, MD   Chief Complaint  Patient presents with  . Edema   Subjective:    HPI  Edema, unspecified type From 07/28/2017-recommended patient weigh qd and take furosemide if there is an overnight weight gain or he notices any edema or dyspnea on exertion. Pt reports that the swelling is better. He would like to stop taking taking the lasix because it reduces his weight too much. He was 147 at the last OV. But pt reports that he usually weighs around 160's before he started taking the lasix. He has a normal appetite and is eating well. He has been taking 1/2 furosemide twice a week since last visit.   He also continue to complain of popping in his neck when he turns it, is a little uncomfortable, but not painful.     Allergies  Allergen Reactions  . Celebrex [Celecoxib]     Noxius taste in mouth  . Mirtazapine     Bad taste in mouth. Refuses to take again     Current Outpatient Medications:  .  atorvastatin (LIPITOR) 80 MG tablet, TAKE 1 TABLET DAILY, Disp: 90 tablet, Rfl: 4 .  clopidogrel (PLAVIX) 75 MG tablet, TAKE 1 TABLET BY MOUTH DAILY, Disp: 90 tablet, Rfl: 4 .  enalapril (VASOTEC) 5 MG tablet, TAKE ONE TABLET BY MOUTH DAILY, Disp: 90 tablet, Rfl: 4 .  fluticasone (FLONASE) 50 MCG/ACT nasal spray, PLACE 2 SPRAYS INTO BOTH NOSTRILS DAILY., Disp: 17 g, Rfl: 5 .  furosemide (LASIX) 20 MG tablet, Take 0.5 tablets (10 mg total) by mouth daily as needed for edema., Disp: 1 tablet, Rfl: 0 .  guaiFENesin-codeine (CHERATUSSIN AC) 100-10 MG/5ML syrup, TAKE 1-2 TEASPOONS EVERY 6 HOURS AS NEEDED FOR COUGH, Disp: 180 mL, Rfl: 3 .  HYDROcodone-acetaminophen (NORCO) 7.5-325 MG tablet, 1 tablet every 4-6 hours as needed, Disp: 180 tablet, Rfl: 0 .  latanoprost (XALATAN) 0.005 % ophthalmic solution, Place 1 drop into both eyes at bedtime. , Disp: , Rfl:  .   metoprolol succinate (TOPROL-XL) 25 MG 24 hr tablet, Take 1 tablet (25 mg total) by mouth daily., Disp: 90 tablet, Rfl: 4 .  ranitidine (ZANTAC) 300 MG tablet, TAKE 1 TABLET TWICE DAILY, Disp: 180 tablet, Rfl: 3 .  testosterone cypionate (DEPOTESTOSTERONE CYPIONATE) 200 MG/ML injection, Inject 1 mL (200 mg total) into the muscle every 28 (twenty-eight) days., Disp: 10 mL, Rfl: 1 .  zolpidem (AMBIEN) 10 MG tablet, TAKE 1/2-1 TABLET EVERY NIGHT AT BEDTIME, Disp: 30 tablet, Rfl: 5 .  nitroGLYCERIN (NITROSTAT) 0.4 MG SL tablet, Place 1 tablet (0.4 mg total) under the tongue every 5 (five) minutes as needed for chest pain. (Patient not taking: Reported on 07/13/2017), Disp: 20 tablet, Rfl: 1  Review of Systems  Constitutional: Positive for unexpected weight change. Negative for appetite change, chills and fever.  Respiratory: Negative for chest tightness, shortness of breath and wheezing.   Cardiovascular: Negative for chest pain and palpitations.  Gastrointestinal: Negative for abdominal pain, nausea and vomiting.    Social History   Tobacco Use  . Smoking status: Former Smoker    Packs/day: 0.50    Years: 20.00    Pack years: 10.00    Last attempt to quit: 05/01/1977    Years since quitting: 40.3  . Smokeless tobacco: Never Used  Substance Use Topics  .  Alcohol use: Yes    Alcohol/week: 8.4 oz    Types: 14 Shots of liquor per week    Comment: moderate; 2 drinks daily   Objective:   BP 120/60 (BP Location: Left Arm, Patient Position: Sitting, Cuff Size: Normal)   Pulse 76   Temp 98.3 F (36.8 C) (Oral)   Resp 16   Wt 146 lb (66.2 kg)   SpO2 99%   BMI 20.95 kg/m  Vitals:   09/01/17 1416  BP: 120/60  Pulse: 76  Resp: 16  Temp: 98.3 F (36.8 C)  TempSrc: Oral  SpO2: 99%  Weight: 146 lb (66.2 kg)     Physical Exam   General Appearance:    Alert, cooperative, no distress  Eyes:    PERRL, conjunctiva/corneas clear, EOM's intact       Lungs:     Clear to auscultation  bilaterally, respirations unlabored  Heart:    Regular rate and rhythm, no edema  Neurologic:   Awake, alert, oriented x 3. No apparent focal neurological           defect.           Assessment & Plan:     1. Edema, unspecified type   2. Dyspnea on exertion  Resolved since placement of pacemaker. Advised only to take 1/2 tablet furosemide if his weight goes up, if he has swelling or any dyspnea on exertion.  3. Osteoarthritis of cervical spine, unspecified spinal osteoarthritis complication status He is considering scheduling appointment with chiropractor. Recommended Dr. Joyce Copa. He will call if his insurance requires referral.   4. DDD (degenerative disc disease), lumbosacral refill- HYDROcodone-acetaminophen (NORCO) 7.5-325 MG tablet; 1 tablet every 4-6 hours as needed  Dispense: 180 tablet; Refill: 0       Lelon Huh, MD  [facility]

## 2017-09-04 ENCOUNTER — Ambulatory Visit (INDEPENDENT_AMBULATORY_CARE_PROVIDER_SITE_OTHER): Payer: Medicare Other

## 2017-09-04 DIAGNOSIS — E291 Testicular hypofunction: Secondary | ICD-10-CM

## 2017-09-04 MED ORDER — TESTOSTERONE CYPIONATE 200 MG/ML IM SOLN
200.0000 mg | Freq: Once | INTRAMUSCULAR | Status: AC
  Administered 2017-09-04: 200 mg via INTRAMUSCULAR

## 2017-09-04 NOTE — Progress Notes (Signed)
IM Injection  Patient is present today for an IM Injection for treatment of: Hypogonadism  Drug: Testosterone Cypionate 200mg /mL Dose:74mL Location:LT upper outer buttocks   Lot: [id number] Exp:12/2018 Patient tolerated well, no complications were noted  Preformed by: Gordy Clement, CMA  Additional notes/ Follow up: 4-5 weeks per patient

## 2017-09-05 DIAGNOSIS — F4323 Adjustment disorder with mixed anxiety and depressed mood: Secondary | ICD-10-CM | POA: Diagnosis not present

## 2017-09-19 DIAGNOSIS — F4323 Adjustment disorder with mixed anxiety and depressed mood: Secondary | ICD-10-CM | POA: Diagnosis not present

## 2017-10-05 DIAGNOSIS — I495 Sick sinus syndrome: Secondary | ICD-10-CM | POA: Diagnosis not present

## 2017-10-11 ENCOUNTER — Ambulatory Visit: Payer: Self-pay | Admitting: Family Medicine

## 2017-10-12 ENCOUNTER — Ambulatory Visit (INDEPENDENT_AMBULATORY_CARE_PROVIDER_SITE_OTHER): Payer: Medicare Other

## 2017-10-12 ENCOUNTER — Other Ambulatory Visit: Payer: Self-pay | Admitting: Family Medicine

## 2017-10-12 DIAGNOSIS — E291 Testicular hypofunction: Secondary | ICD-10-CM

## 2017-10-12 MED ORDER — TESTOSTERONE CYPIONATE 200 MG/ML IM SOLN
200.0000 mg | Freq: Once | INTRAMUSCULAR | Status: AC
  Administered 2017-10-12: 200 mg via INTRAMUSCULAR

## 2017-10-12 NOTE — Progress Notes (Signed)
Testosterone IM Injection  Due to Hypogonadism patient is present today for a Testosterone Injection.  Medication: Testosterone Cypionate Dose: 59ml Location: right upper outer buttocks Lot: [id number] Exp: 12/2018  Patient tolerated well, no complications were noted  Preformed by: Cristie Hem, CMA  Follow up: 1 month

## 2017-10-13 ENCOUNTER — Other Ambulatory Visit: Payer: Self-pay

## 2017-10-13 DIAGNOSIS — R05 Cough: Secondary | ICD-10-CM

## 2017-10-13 DIAGNOSIS — R053 Chronic cough: Secondary | ICD-10-CM

## 2017-10-13 MED ORDER — GUAIFENESIN-CODEINE 100-10 MG/5ML PO SYRP: ORAL_SOLUTION | ORAL | 3 refills | Status: DC

## 2017-10-25 ENCOUNTER — Ambulatory Visit: Payer: Self-pay | Admitting: Family Medicine

## 2017-10-26 ENCOUNTER — Encounter: Payer: Self-pay | Admitting: Family Medicine

## 2017-10-26 ENCOUNTER — Ambulatory Visit (INDEPENDENT_AMBULATORY_CARE_PROVIDER_SITE_OTHER): Payer: Medicare Other | Admitting: Family Medicine

## 2017-10-26 VITALS — BP 102/58 | HR 64 | Temp 97.9°F | Wt 153.8 lb

## 2017-10-26 DIAGNOSIS — S51812A Laceration without foreign body of left forearm, initial encounter: Secondary | ICD-10-CM

## 2017-10-26 DIAGNOSIS — Z23 Encounter for immunization: Secondary | ICD-10-CM | POA: Diagnosis not present

## 2017-10-26 DIAGNOSIS — I2510 Atherosclerotic heart disease of native coronary artery without angina pectoris: Secondary | ICD-10-CM

## 2017-10-26 NOTE — Progress Notes (Signed)
Patient: Ivan Salazar Male    DOB: 07/27/1931   84 y.o.   MRN: [id number] Visit Date: 10/26/2017  Today's Provider: Vernie Murders, PA   Chief Complaint  Patient presents with  . Abrasion on left forearm   Subjective:    HPI Patient presents today with a cut to left forearm. Patient states he scraped his arm on the back of a metal chair in the restaurant last Friday. The area is bruised around the laceration. Patient denies pain.     Past Medical History:  Diagnosis Date  . BPH (benign prostatic hyperplasia)   . CAD (coronary artery disease)   . CAD (coronary artery disease)   . Carpal tunnel syndrome   . DDD (degenerative disc disease), cervical   . Environmental and seasonal allergies   . GERD (gastroesophageal reflux disease)   . Glaucoma   . HLD (hyperlipidemia)   . Hypertension   . Hypogonadism in male   . Impotence   . Nocturia   . Weight loss    Past Surgical History:  Procedure Laterality Date  . APPENDECTOMY    . BILATERAL CARPAL TUNNEL RELEASE     05/12/2011, 06/09/2011  . CARDIAC CATHETERIZATION  2004  . coils in vessel     6 coils  . CORONARY ANGIOPLASTY     x 5  . CORONARY ARTERY BYPASS GRAFT  2007   5; [facility]  . CORONARY STENT PLACEMENT  2005  . ESOPHAGOGASTRODUODENOSCOPY (EGD) WITH PROPOFOL N/A 10/28/2015   Procedure: ESOPHAGOGASTRODUODENOSCOPY (EGD) WITH PROPOFOL;  Surgeon: Manya Silvas, MD;  Location: [facility] ENDOSCOPY;  Service: Endoscopy;  Laterality: N/A;  . KIDNEY STONE SURGERY    . PACEMAKER INSERTION N/A 07/13/2017   Procedure: INSERTION PACEMAKER;  Surgeon: Isaias Cowman, MD;  Location: [facility] ORS;  Service: Cardiovascular;  Laterality: N/A;  . SHOULDER ARTHROSCOPY W/ ROTATOR CUFF REPAIR Bilateral   . TONSILLECTOMY    . UPPER GI ENDOSCOPY  08/02/2010   Dr. Tedra Coupe, gastritis. H Pylori negative   Family History  Problem Relation Age of Onset  . Cirrhosis Father        of liver  . Kidney disease Neg Hx   . Prostate cancer  Neg Hx   . Kidney cancer Neg Hx   . Bladder Cancer Neg Hx    Allergies  Allergen Reactions  . Celebrex [Celecoxib]     Noxius taste in mouth  . Mirtazapine     Bad taste in mouth. Refuses to take again    Current Outpatient Medications:  .  atorvastatin (LIPITOR) 80 MG tablet, TAKE 1 TABLET DAILY, Disp: 90 tablet, Rfl: 4 .  clopidogrel (PLAVIX) 75 MG tablet, TAKE 1 TABLET BY MOUTH DAILY, Disp: 90 tablet, Rfl: 4 .  enalapril (VASOTEC) 5 MG tablet, TAKE ONE TABLET BY MOUTH DAILY, Disp: 90 tablet, Rfl: 4 .  fluticasone (FLONASE) 50 MCG/ACT nasal spray, PLACE 2 SPRAYS INTO BOTH NOSTRILS DAILY., Disp: 17 g, Rfl: 5 .  furosemide (LASIX) 20 MG tablet, Take 0.5 tablets (10 mg total) by mouth daily as needed for edema., Disp: 1 tablet, Rfl: 0 .  guaiFENesin-codeine (CHERATUSSIN AC) 100-10 MG/5ML syrup, TAKE 1-2 TEASPOONS EVERY 6 HOURS AS NEEDED FOR COUGH, Disp: 180 mL, Rfl: 3 .  HYDROcodone-acetaminophen (NORCO) 7.5-325 MG tablet, 1 tablet every 4-6 hours as needed, Disp: 180 tablet, Rfl: 0 .  latanoprost (XALATAN) 0.005 % ophthalmic solution, Place 1 drop into both eyes at bedtime. , Disp: , Rfl:  .  metoprolol succinate (TOPROL-XL) 25 MG 24 hr tablet, Take 1 tablet (25 mg total) by mouth daily., Disp: 90 tablet, Rfl: 4 .  nitroGLYCERIN (NITROSTAT) 0.4 MG SL tablet, Place 1 tablet (0.4 mg total) under the tongue every 5 (five) minutes as needed for chest pain., Disp: 20 tablet, Rfl: 1 .  ranitidine (ZANTAC) 300 MG tablet, TAKE 1 TABLET TWICE DAILY, Disp: 180 tablet, Rfl: 3 .  testosterone cypionate (DEPOTESTOSTERONE CYPIONATE) 200 MG/ML injection, Inject 1 mL (200 mg total) into the muscle every 28 (twenty-eight) days., Disp: 10 mL, Rfl: 1 .  zolpidem (AMBIEN) 10 MG tablet, TAKE 1/2-1 TABLET EVERY NIGHT AT BEDTIME, Disp: 30 tablet, Rfl: 5  Review of Systems  Constitutional: Negative.   Respiratory: Negative.   Cardiovascular: Negative.    Social History   Tobacco Use  . Smoking status:  Former Smoker    Packs/day: 0.50    Years: 20.00    Pack years: 10.00    Last attempt to quit: 05/01/1977    Years since quitting: 40.5  . Smokeless tobacco: Never Used  Substance Use Topics  . Alcohol use: Yes    Alcohol/week: 8.4 oz    Types: 14 Shots of liquor per week    Comment: moderate; 2 drinks daily   Objective:   BP (!) 102/58 (BP Location: Right Arm, Patient Position: Sitting, Cuff Size: Normal)   Pulse 64   Temp 97.9 F (36.6 C) (Oral)   Wt 153 lb 12.8 oz (69.8 kg)   SpO2 98%   BMI 22.07 kg/m   Physical Exam  Constitutional: He is oriented to person, place, and time. He appears well-developed and well-nourished. No distress.  HENT:  Head: Normocephalic and atraumatic.  Right Ear: Hearing normal.  Left Ear: Hearing normal.  Nose: Nose normal.  Eyes: Conjunctivae and lids are normal. Right eye exhibits no discharge. Left eye exhibits no discharge. No scleral icterus.  Pulmonary/Chest: Effort normal. No respiratory distress.  Neurological: He is alert and oriented to person, place, and time.  Skin: Skin is intact. No lesion and no rash noted.  1 cm V-shaped laceration left forearm. No neurovascular deficit. Large 8x5 cm area of ecchymosis surrounding.   Psychiatric: He has a normal mood and affect. His speech is normal and behavior is normal. Thought content normal.      Assessment & Plan:     1. Laceration of skin of left forearm, initial encounter Onset 10-20-17 when he bumped the arm on a sharp corner of a chair in a restaurant. Bleeding has been controlled and no sign of purulent drainage, significant erythema or local lymphadenopathy. Will continue Band-Aid dressing since this is too old to suture. Last tetanus was in 2009. Will up date Td booster and recheck if any signs of infection or bleeding restarts. - Td : Tetanus/diphtheria >7yo Preservative  free       Vernie Murders, PA  [facility]

## 2017-10-26 NOTE — Patient Instructions (Signed)

## 2017-10-31 DIAGNOSIS — F4323 Adjustment disorder with mixed anxiety and depressed mood: Secondary | ICD-10-CM | POA: Diagnosis not present

## 2017-11-01 ENCOUNTER — Ambulatory Visit: Payer: Medicare Other | Admitting: Family Medicine

## 2017-11-03 ENCOUNTER — Encounter: Payer: Self-pay | Admitting: Family Medicine

## 2017-11-03 ENCOUNTER — Ambulatory Visit (INDEPENDENT_AMBULATORY_CARE_PROVIDER_SITE_OTHER): Payer: Medicare Other | Admitting: Family Medicine

## 2017-11-03 VITALS — BP 102/60 | HR 66 | Temp 98.7°F | Resp 18 | Wt 149.0 lb

## 2017-11-03 DIAGNOSIS — M542 Cervicalgia: Secondary | ICD-10-CM | POA: Diagnosis not present

## 2017-11-03 DIAGNOSIS — H6121 Impacted cerumen, right ear: Secondary | ICD-10-CM

## 2017-11-03 DIAGNOSIS — I2510 Atherosclerotic heart disease of native coronary artery without angina pectoris: Secondary | ICD-10-CM

## 2017-11-03 DIAGNOSIS — I10 Essential (primary) hypertension: Secondary | ICD-10-CM | POA: Diagnosis not present

## 2017-11-03 MED ORDER — HYDROCORTISONE 2.5 % EX CREA: TOPICAL_CREAM | Freq: Two times a day (BID) | CUTANEOUS | 0 refills | Status: DC | PRN

## 2017-11-03 NOTE — Progress Notes (Signed)
Patient: Ivan Salazar Male    DOB: Jul 08, 1931   84 y.o.   MRN: [id number] Visit Date: 11/03/2017  Today's Provider: Lelon Huh, MD   Chief Complaint  Patient presents with  . Follow-up   Subjective:    HPI Follow up of DDD: Patient was last seen for this problem 2 months ago and no changes were made. Patient reports good compliance with treatment, good tolerance and fair symptom control.   Follow up of CAD: Patient was last seen for this problem 5 months ago and no changes were made. Patient was to continue follow up with Cardiology. Patient reports good compliance with treatment. He states the swelling in his legs is stable since last visit.    Lipid/Cholesterol, Follow-up:   Last seen for this 5 months ago.  Management changes since that visit include no changes. . Last Lipid Panel:    Component Value Date/Time   CHOL 116 06/14/2017 1618   TRIG 97 06/14/2017 1618   HDL 53 06/14/2017 1618   CHOLHDL 2.2 06/14/2017 1618   LDLCALC 44 06/14/2017 1618    Risk factors for vascular disease include hypercholesterolemia  He reports excellent compliance with treatment. He is not having side effects.    Wt Readings from Last 3 Encounters:  11/03/17 149 lb (67.6 kg)  10/26/17 153 lb 12.8 oz (69.8 kg)  09/01/17 146 lb (66.2 kg)    -------------------------------------------------------------------  Follow chronic L edema He hasn't taken furosemide since before his last visit here. States breathing is much better since having pacemaker placed. No chest pain or heart flutters.   Also complains of feeling full in his right hear and sounds like an echo all the time. Feels like he needs wax cleaned out.     Allergies  Allergen Reactions  . Celebrex [Celecoxib]     Noxius taste in mouth  . Mirtazapine     Bad taste in mouth. Refuses to take again     Current Outpatient Medications:  .  atorvastatin (LIPITOR) 80 MG tablet, TAKE 1 TABLET DAILY, Disp: 90 tablet,  Rfl: 4 .  clopidogrel (PLAVIX) 75 MG tablet, TAKE 1 TABLET BY MOUTH DAILY, Disp: 90 tablet, Rfl: 4 .  enalapril (VASOTEC) 5 MG tablet, TAKE ONE TABLET BY MOUTH DAILY, Disp: 90 tablet, Rfl: 4 .  fluticasone (FLONASE) 50 MCG/ACT nasal spray, PLACE 2 SPRAYS INTO BOTH NOSTRILS DAILY., Disp: 17 g, Rfl: 5 .  guaiFENesin-codeine (CHERATUSSIN AC) 100-10 MG/5ML syrup, TAKE 1-2 TEASPOONS EVERY 6 HOURS AS NEEDED FOR COUGH, Disp: 180 mL, Rfl: 3 .  HYDROcodone-acetaminophen (NORCO) 7.5-325 MG tablet, 1 tablet every 4-6 hours as needed, Disp: 180 tablet, Rfl: 0 .  latanoprost (XALATAN) 0.005 % ophthalmic solution, Place 1 drop into both eyes at bedtime. , Disp: , Rfl:  .  metoprolol succinate (TOPROL-XL) 25 MG 24 hr tablet, Take 1 tablet (25 mg total) by mouth daily., Disp: 90 tablet, Rfl: 4 .  ranitidine (ZANTAC) 300 MG tablet, TAKE 1 TABLET TWICE DAILY, Disp: 180 tablet, Rfl: 3 .  testosterone cypionate (DEPOTESTOSTERONE CYPIONATE) 200 MG/ML injection, Inject 1 mL (200 mg total) into the muscle every 28 (twenty-eight) days., Disp: 10 mL, Rfl: 1 .  zolpidem (AMBIEN) 10 MG tablet, TAKE 1/2-1 TABLET EVERY NIGHT AT BEDTIME, Disp: 30 tablet, Rfl: 5 .  furosemide (LASIX) 20 MG tablet, Take 0.5 tablets (10 mg total) by mouth daily as needed for edema. (Patient not taking: Reported on 11/03/2017), Disp: 1 tablet, Rfl: 0 .  nitroGLYCERIN (NITROSTAT) 0.4 MG SL tablet, Place 1 tablet (0.4 mg total) under the tongue every 5 (five) minutes as needed for chest pain. (Patient not taking: Reported on 11/03/2017), Disp: 20 tablet, Rfl: 1  Review of Systems  Constitutional: Negative for appetite change, chills and fever.  Respiratory: Negative for chest tightness, shortness of breath and wheezing.   Cardiovascular: Positive for leg swelling (improved and stable). Negative for chest pain and palpitations.  Gastrointestinal: Negative for abdominal pain, nausea and vomiting.  Musculoskeletal: Positive for neck pain.    Social  History   Tobacco Use  . Smoking status: Former Smoker    Packs/day: 0.50    Years: 20.00    Pack years: 10.00    Last attempt to quit: 05/01/1977    Years since quitting: 40.5  . Smokeless tobacco: Never Used  Substance Use Topics  . Alcohol use: Yes    Alcohol/week: 8.4 oz    Types: 14 Shots of liquor per week    Comment: moderate; 2 drinks daily   Objective:   BP 102/60 (BP Location: Left Arm, Patient Position: Sitting, Cuff Size: Normal)   Pulse 66   Temp 98.7 F (37.1 C) (Oral)   Resp 18   Wt 149 lb (67.6 kg)   SpO2 97% Comment: room air  BMI 21.38 kg/m  Vitals:   11/03/17 1552  BP: 102/60  Pulse: 66  Resp: 18  Temp: 98.7 F (37.1 C)  TempSrc: Oral  SpO2: 97%  Weight: 149 lb (67.6 kg)     Physical Exam  General Appearance:    Alert, cooperative, no distress  HENT:   ENT exam normal, no neck nodes or sinus tenderness  Eyes:    PERRL, conjunctiva/corneas clear, EOM's intact     Right ear canal obstructed with cerumen.  Lungs:     Clear to auscultation bilaterally, respirations unlabored  Heart:    Regular rate and rhythm  Neurologic:   Awake, alert, oriented x 3. No apparent focal neurological           defect.          Assessment & Plan:     1. Excessive cerumen in ear canal, right After soaking with Debrox, ear canal was irrigated with water until clear. Patient tolerated procedure well.    2. Essential (primary) hypertension Well controlled.  Continue current medications.    3. CAD in native artery Asymptomatic. Compliant with medication.  Continue aggressive risk factor modification.         Lelon Huh, MD  [facility]

## 2017-11-14 ENCOUNTER — Ambulatory Visit (INDEPENDENT_AMBULATORY_CARE_PROVIDER_SITE_OTHER): Payer: Medicare Other | Admitting: Family Medicine

## 2017-11-14 ENCOUNTER — Encounter: Payer: Self-pay | Admitting: Family Medicine

## 2017-11-14 VITALS — BP 94/50 | HR 75 | Temp 98.0°F | Resp 16 | Wt 149.0 lb

## 2017-11-14 DIAGNOSIS — I2510 Atherosclerotic heart disease of native coronary artery without angina pectoris: Secondary | ICD-10-CM | POA: Diagnosis not present

## 2017-11-14 DIAGNOSIS — M5432 Sciatica, left side: Secondary | ICD-10-CM

## 2017-11-14 MED ORDER — PREDNISONE 20 MG PO TABS: ORAL_TABLET | ORAL | 0 refills | Status: AC

## 2017-11-14 NOTE — Progress Notes (Signed)
Patient: Ivan Salazar Male    DOB: 1932/02/24   84 y.o.   MRN: [id number] Visit Date: 11/14/2017  Today's Provider: Lelon Huh, MD   Chief Complaint  Patient presents with  . Back Pain  . Leg Pain   Subjective:    HPI  Patient states he has had a flare up in left sided sciatica. Pain in left hip radiating down into left leg. Patient states he was referred to Dr. Earnestine Leys for treatment last year, where he received SI steroid injections and PT which were effective. . Patient states sciatica pain flared back up 4 nights ago.  Allergies  Allergen Reactions  . Celebrex [Celecoxib]     Noxius taste in mouth  . Mirtazapine     Bad taste in mouth. Refuses to take again     Current Outpatient Medications:  .  atorvastatin (LIPITOR) 80 MG tablet, TAKE 1 TABLET DAILY, Disp: 90 tablet, Rfl: 4 .  clopidogrel (PLAVIX) 75 MG tablet, TAKE 1 TABLET BY MOUTH DAILY, Disp: 90 tablet, Rfl: 4 .  enalapril (VASOTEC) 5 MG tablet, TAKE ONE TABLET BY MOUTH DAILY, Disp: 90 tablet, Rfl: 4 .  fluticasone (FLONASE) 50 MCG/ACT nasal spray, PLACE 2 SPRAYS INTO BOTH NOSTRILS DAILY., Disp: 17 g, Rfl: 5 .  furosemide (LASIX) 20 MG tablet, Take 0.5 tablets (10 mg total) by mouth daily as needed for edema., Disp: 1 tablet, Rfl: 0 .  guaiFENesin-codeine (CHERATUSSIN AC) 100-10 MG/5ML syrup, TAKE 1-2 TEASPOONS EVERY 6 HOURS AS NEEDED FOR COUGH, Disp: 180 mL, Rfl: 3 .  HYDROcodone-acetaminophen (NORCO) 7.5-325 MG tablet, 1 tablet every 4-6 hours as needed, Disp: 180 tablet, Rfl: 0 .  hydrocortisone 2.5 % cream, Apply topically 2 (two) times daily as needed., Disp: 30 g, Rfl: 0 .  latanoprost (XALATAN) 0.005 % ophthalmic solution, Place 1 drop into both eyes at bedtime. , Disp: , Rfl:  .  metoprolol succinate (TOPROL-XL) 25 MG 24 hr tablet, Take 1 tablet (25 mg total) by mouth daily., Disp: 90 tablet, Rfl: 4 .  nitroGLYCERIN (NITROSTAT) 0.4 MG SL tablet, Place 1 tablet (0.4 mg total) under the tongue  every 5 (five) minutes as needed for chest pain., Disp: 20 tablet, Rfl: 1 .  ranitidine (ZANTAC) 300 MG tablet, TAKE 1 TABLET TWICE DAILY, Disp: 180 tablet, Rfl: 3 .  testosterone cypionate (DEPOTESTOSTERONE CYPIONATE) 200 MG/ML injection, Inject 1 mL (200 mg total) into the muscle every 28 (twenty-eight) days., Disp: 10 mL, Rfl: 1 .  zolpidem (AMBIEN) 10 MG tablet, TAKE 1/2-1 TABLET EVERY NIGHT AT BEDTIME, Disp: 30 tablet, Rfl: 5  Review of Systems  Constitutional: Negative for appetite change, chills and fever.  Respiratory: Negative for chest tightness, shortness of breath and wheezing.   Cardiovascular: Negative for chest pain and palpitations.  Gastrointestinal: Negative for abdominal pain, nausea and vomiting.  Musculoskeletal: Positive for back pain.    Social History   Tobacco Use  . Smoking status: Former Smoker    Packs/day: 0.50    Years: 20.00    Pack years: 10.00    Last attempt to quit: 05/01/1977    Years since quitting: 40.5  . Smokeless tobacco: Never Used  Substance Use Topics  . Alcohol use: Yes    Alcohol/week: 8.4 oz    Types: 14 Shots of liquor per week    Comment: moderate; 2 drinks daily   Objective:   BP (!) 94/50 (BP Location: Right Arm, Patient Position: Sitting, Cuff Size:  Normal)   Pulse 75   Temp 98 F (36.7 C) (Oral)   Resp 16   Wt 149 lb (67.6 kg)   SpO2 98%   BMI 21.38 kg/m  Vitals:   11/14/17 1527  BP: (!) 94/50  Pulse: 75  Resp: 16  Temp: 98 F (36.7 C)  TempSrc: Oral  SpO2: 98%  Weight: 149 lb (67.6 kg)     Physical Exam  General appearance: alert, well developed, well nourished, cooperative and in no distress Head: Normocephalic, without obvious abnormality, atraumatic Respiratory: Respirations even and unlabored, normal respiratory rate Extremities: Moderately tender over left SI joint no gross deformities.      Assessment & Plan:     1. Sciatic nerve pain, left  - predniSONE (DELTASONE) 20 MG tablet; Take one  tablet three times daily for 2 days, then one twice a day for 2 days, then one a day for 2 days  Dispense: 12 tablet; Refill: 0  Did well with SI injections last year by Dr. Sabra Heck, call for referral if not much better within the next week.       Lelon Huh, MD  [facility]

## 2017-11-16 ENCOUNTER — Ambulatory Visit (INDEPENDENT_AMBULATORY_CARE_PROVIDER_SITE_OTHER): Payer: Medicare Other

## 2017-11-16 DIAGNOSIS — E291 Testicular hypofunction: Secondary | ICD-10-CM | POA: Diagnosis not present

## 2017-11-16 MED ORDER — TESTOSTERONE CYPIONATE 200 MG/ML IM SOLN
200.0000 mg | Freq: Once | INTRAMUSCULAR | Status: AC
  Administered 2017-11-16: 200 mg via INTRAMUSCULAR

## 2017-11-16 NOTE — Progress Notes (Signed)
Testosterone IM Injection  Due to Hypogonadism patient is present today for a Testosterone Injection.  Medication: Testosterone Cypionate Dose: 51ml Location: right upper outer buttocks (pt having left sciatic issues today) Lot: [id number] Exp:05/2019  Patient tolerated well, no complications were noted  Preformed by: Cristie Hem, CMA  Follow up: As scheduled

## 2017-11-24 NOTE — Progress Notes (Signed)
Patient: Ivan Salazar Male    DOB: [birth year]   84 y.o.   MRN: [id number] Visit Date: 11/27/2017  Today's Provider: Lelon Huh, MD   Chief Complaint  Patient presents with  . Follow Up Sciatica   Subjective:    HPI  Sciatic nerve pain, left: From 11/14/2017-given rx for predniSONE (DELTASONE) 20 MG tablet. Did well with SI injections last year by Dr. Sabra Heck, call for referral if not much better within the next week. Patent reports good compliance with treatment plan. He states symptoms are somewhat better. Patient states he is taking Hydrocodone and that helps the pain for approximately 3 hours then the pain returns. He is also wearing a back brace for support. Patient reports he scheduled a appointment with Dr. Sabra Heck at [facility] he is scheduled on 12/01/17.   Allergies  Allergen Reactions  . Celebrex [Celecoxib]     Noxius taste in mouth  . Mirtazapine     Bad taste in mouth. Refuses to take again     Current Outpatient Medications:  .  atorvastatin (LIPITOR) 80 MG tablet, TAKE 1 TABLET DAILY, Disp: 90 tablet, Rfl: 4 .  clopidogrel (PLAVIX) 75 MG tablet, TAKE 1 TABLET BY MOUTH DAILY, Disp: 90 tablet, Rfl: 4 .  enalapril (VASOTEC) 5 MG tablet, TAKE ONE TABLET BY MOUTH DAILY, Disp: 90 tablet, Rfl: 4 .  fluticasone (FLONASE) 50 MCG/ACT nasal spray, PLACE 2 SPRAYS INTO BOTH NOSTRILS DAILY., Disp: 17 g, Rfl: 5 .  furosemide (LASIX) 20 MG tablet, Take 0.5 tablets (10 mg total) by mouth daily as needed for edema., Disp: 1 tablet, Rfl: 0 .  guaiFENesin-codeine (CHERATUSSIN AC) 100-10 MG/5ML syrup, TAKE 1-2 TEASPOONS EVERY 6 HOURS AS NEEDED FOR COUGH, Disp: 180 mL, Rfl: 3 .  HYDROcodone-acetaminophen (NORCO) 7.5-325 MG tablet, 1 tablet every 4-6 hours as needed, Disp: 180 tablet, Rfl: 0 .  hydrocortisone 2.5 % cream, Apply topically 2 (two) times daily as needed., Disp: 30 g, Rfl: 0 .  latanoprost (XALATAN) 0.005 % ophthalmic solution, Place 1 drop into both eyes at bedtime. ,  Disp: , Rfl:  .  metoprolol succinate (TOPROL-XL) 25 MG 24 hr tablet, Take 1 tablet (25 mg total) by mouth daily., Disp: 90 tablet, Rfl: 4 .  nitroGLYCERIN (NITROSTAT) 0.4 MG SL tablet, Place 1 tablet (0.4 mg total) under the tongue every 5 (five) minutes as needed for chest pain., Disp: 20 tablet, Rfl: 1 .  ranitidine (ZANTAC) 300 MG tablet, TAKE 1 TABLET TWICE DAILY, Disp: 180 tablet, Rfl: 4 .  testosterone cypionate (DEPOTESTOSTERONE CYPIONATE) 200 MG/ML injection, Inject 1 mL (200 mg total) into the muscle every 28 (twenty-eight) days., Disp: 10 mL, Rfl: 1 .  zolpidem (AMBIEN) 10 MG tablet, TAKE 1/2-1 TABLET EVERY NIGHT AT BEDTIME, Disp: 30 tablet, Rfl: 5   Review of Systems  Constitutional: Negative for appetite change, chills and fever.  Respiratory: Negative for chest tightness, shortness of breath and wheezing.   Cardiovascular: Negative for chest pain and palpitations.  Gastrointestinal: Negative for abdominal pain, nausea and vomiting.  Musculoskeletal: Positive for back pain.       Leg pain and hip pain    Social History   Tobacco Use  . Smoking status: Former Smoker    Packs/day: 0.50    Years: 20.00    Pack years: 10.00    Last attempt to quit: 05/01/1977    Years since quitting: 40.6  . Smokeless tobacco: Never Used  Substance Use Topics  .  Alcohol use: Yes    Alcohol/week: 8.4 oz    Types: 14 Shots of liquor per week    Comment: moderate; 2 drinks daily   Objective:   BP 120/68 (BP Location: Left Arm, Patient Position: Sitting, Cuff Size: Normal)   Pulse 68   Temp 98.1 F (36.7 C) (Oral)   Wt 151 lb 3.2 oz (68.6 kg)   SpO2 98%   BMI 21.69 kg/m  Vitals:   11/27/17 1619  BP: 120/68  Pulse: 68  Temp: 98.1 F (36.7 C)  TempSrc: Oral  SpO2: 98%  Weight: 151 lb 3.2 oz (68.6 kg)     Physical Exam  Physical Exam  General appearance: alert, well developed, well nourished, cooperative and in no distress Head: Normocephalic, without obvious abnormality,  atraumatic Respiratory: Respirations even and unlabored, normal respiratory rate Extremities: Moderately tender over left SI joint no gross deformities.     Assessment & Plan:     1. Sciatic nerve pain, left He is scheduled to see Dr. Sabra Heck for this next week. He is not sure if wants to have PT done at [facility] if Dr. Sabra Heck recommends it. He thinks he might rather go to [facility] for physical therapy. Advised he could call for referral if Dr. Sabra Heck is not able to accommodate his wishes.        Lelon Huh, MD  [facility]

## 2017-11-25 ENCOUNTER — Other Ambulatory Visit: Payer: Self-pay | Admitting: Family Medicine

## 2017-11-27 ENCOUNTER — Encounter: Payer: Self-pay | Admitting: Family Medicine

## 2017-11-27 ENCOUNTER — Ambulatory Visit (INDEPENDENT_AMBULATORY_CARE_PROVIDER_SITE_OTHER): Payer: Medicare Other | Admitting: Family Medicine

## 2017-11-27 VITALS — BP 120/68 | HR 68 | Temp 98.1°F | Wt 151.2 lb

## 2017-11-27 DIAGNOSIS — M5432 Sciatica, left side: Secondary | ICD-10-CM | POA: Diagnosis not present

## 2017-11-27 DIAGNOSIS — H401131 Primary open-angle glaucoma, bilateral, mild stage: Secondary | ICD-10-CM | POA: Diagnosis not present

## 2017-11-27 DIAGNOSIS — I2510 Atherosclerotic heart disease of native coronary artery without angina pectoris: Secondary | ICD-10-CM | POA: Diagnosis not present

## 2017-11-28 ENCOUNTER — Other Ambulatory Visit: Payer: Self-pay | Admitting: Family Medicine

## 2017-11-28 DIAGNOSIS — M5137 Other intervertebral disc degeneration, lumbosacral region: Secondary | ICD-10-CM

## 2017-11-28 DIAGNOSIS — F4323 Adjustment disorder with mixed anxiety and depressed mood: Secondary | ICD-10-CM | POA: Diagnosis not present

## 2017-11-28 MED ORDER — HYDROCODONE-ACETAMINOPHEN 7.5-325 MG PO TABS: ORAL_TABLET | ORAL | 0 refills | Status: DC

## 2017-11-28 NOTE — Telephone Encounter (Signed)
Pt contacted office for refill request on the following medications:  HYDROcodone-acetaminophen ([place]) 7.5-325 MG tablet  Pt stated at his OV with Dr. Caryn Section yesterday it was discussed that pt will be taking his last Rx for the medication to be filled at the pharmacy today. Pt stated he was advised that he could come in to pick up his next 3 Rx today. Pt is requesting to pick them up today. Please advise. Thanks TNP

## 2017-11-28 NOTE — Telephone Encounter (Signed)
done

## 2017-12-01 DIAGNOSIS — M533 Sacrococcygeal disorders, not elsewhere classified: Secondary | ICD-10-CM | POA: Diagnosis not present

## 2017-12-03 ENCOUNTER — Other Ambulatory Visit: Payer: Self-pay | Admitting: Family Medicine

## 2017-12-06 DIAGNOSIS — H401131 Primary open-angle glaucoma, bilateral, mild stage: Secondary | ICD-10-CM | POA: Diagnosis not present

## 2017-12-19 DIAGNOSIS — F4323 Adjustment disorder with mixed anxiety and depressed mood: Secondary | ICD-10-CM | POA: Diagnosis not present

## 2017-12-21 ENCOUNTER — Ambulatory Visit (INDEPENDENT_AMBULATORY_CARE_PROVIDER_SITE_OTHER): Payer: Medicare Other

## 2017-12-21 DIAGNOSIS — E291 Testicular hypofunction: Secondary | ICD-10-CM

## 2017-12-21 MED ORDER — TESTOSTERONE CYPIONATE 200 MG/ML IM SOLN
200.0000 mg | Freq: Once | INTRAMUSCULAR | Status: AC
  Administered 2017-12-21: 200 mg via INTRAMUSCULAR

## 2017-12-21 NOTE — Progress Notes (Signed)
Testosterone IM Injection  Due to Hypogonadism patient is present today for a Testosterone Injection.  Medication: Testosterone Cypionate Dose: 40ml Location: right upper outer buttocks Lot: [id number] Exp:05/2019  Patient tolerated well, no complications were noted  Preformed by: Cristie Hem, CMA  Follow up: As Scheduled

## 2017-12-22 ENCOUNTER — Telehealth: Payer: Self-pay | Admitting: Family Medicine

## 2017-12-22 NOTE — Telephone Encounter (Signed)
I called pharmacy and advised pharmacist as below. Per pharmacist, patient's insurance may not pay for early refill and patient may have to pay out of pocket. Patient was advised of this and verbally voiced understanding.

## 2017-12-22 NOTE — Telephone Encounter (Signed)
t came by asking for an early refill on his generic Ambien 10 mg.  He accidentally lost them in the trash.  He is completely out  [employer]

## 2017-12-22 NOTE — Telephone Encounter (Signed)
Please advise 

## 2017-12-22 NOTE — Telephone Encounter (Signed)
Please advise pharmacy they may go ahead and dispense Ambien, he should have refills on current prescription.

## 2017-12-27 DIAGNOSIS — M545 Low back pain: Secondary | ICD-10-CM | POA: Diagnosis not present

## 2017-12-27 DIAGNOSIS — M5416 Radiculopathy, lumbar region: Secondary | ICD-10-CM | POA: Diagnosis not present

## 2017-12-29 DIAGNOSIS — M545 Low back pain: Secondary | ICD-10-CM | POA: Diagnosis not present

## 2017-12-29 DIAGNOSIS — M5416 Radiculopathy, lumbar region: Secondary | ICD-10-CM | POA: Diagnosis not present

## 2018-01-03 DIAGNOSIS — M5416 Radiculopathy, lumbar region: Secondary | ICD-10-CM | POA: Diagnosis not present

## 2018-01-03 DIAGNOSIS — M545 Low back pain: Secondary | ICD-10-CM | POA: Diagnosis not present

## 2018-01-05 DIAGNOSIS — M5416 Radiculopathy, lumbar region: Secondary | ICD-10-CM | POA: Diagnosis not present

## 2018-01-05 DIAGNOSIS — M545 Low back pain: Secondary | ICD-10-CM | POA: Diagnosis not present

## 2018-01-09 DIAGNOSIS — I495 Sick sinus syndrome: Secondary | ICD-10-CM | POA: Diagnosis not present

## 2018-01-10 DIAGNOSIS — M5416 Radiculopathy, lumbar region: Secondary | ICD-10-CM | POA: Diagnosis not present

## 2018-01-10 DIAGNOSIS — M545 Low back pain: Secondary | ICD-10-CM | POA: Diagnosis not present

## 2018-01-11 ENCOUNTER — Other Ambulatory Visit: Payer: Self-pay | Admitting: Family Medicine

## 2018-01-11 DIAGNOSIS — R05 Cough: Secondary | ICD-10-CM

## 2018-01-11 DIAGNOSIS — R053 Chronic cough: Secondary | ICD-10-CM

## 2018-01-12 DIAGNOSIS — M5416 Radiculopathy, lumbar region: Secondary | ICD-10-CM | POA: Diagnosis not present

## 2018-01-12 DIAGNOSIS — M545 Low back pain: Secondary | ICD-10-CM | POA: Diagnosis not present

## 2018-01-15 DIAGNOSIS — M545 Low back pain: Secondary | ICD-10-CM | POA: Diagnosis not present

## 2018-01-15 DIAGNOSIS — M5416 Radiculopathy, lumbar region: Secondary | ICD-10-CM | POA: Diagnosis not present

## 2018-01-18 DIAGNOSIS — M5416 Radiculopathy, lumbar region: Secondary | ICD-10-CM | POA: Diagnosis not present

## 2018-01-18 DIAGNOSIS — M545 Low back pain: Secondary | ICD-10-CM | POA: Diagnosis not present

## 2018-01-22 DIAGNOSIS — M5416 Radiculopathy, lumbar region: Secondary | ICD-10-CM | POA: Diagnosis not present

## 2018-01-22 DIAGNOSIS — M545 Low back pain: Secondary | ICD-10-CM | POA: Diagnosis not present

## 2018-01-24 ENCOUNTER — Ambulatory Visit: Payer: Medicare Other

## 2018-01-25 ENCOUNTER — Ambulatory Visit (INDEPENDENT_AMBULATORY_CARE_PROVIDER_SITE_OTHER): Payer: Medicare Other | Admitting: Family Medicine

## 2018-01-25 DIAGNOSIS — E291 Testicular hypofunction: Secondary | ICD-10-CM

## 2018-01-25 MED ORDER — TESTOSTERONE CYPIONATE 200 MG/ML IM SOLN
200.0000 mg | Freq: Once | INTRAMUSCULAR | Status: AC
  Administered 2018-01-25: 200 mg via INTRAMUSCULAR

## 2018-01-25 NOTE — Progress Notes (Signed)
Testosterone IM Injection  Due to Hypogonadism patient is present today for a Testosterone Injection.  Medication: Testosterone Cypionate Dose: 25ml Location: left upper outer buttocks Lot: [id number] Exp:05/2019  Patient tolerated well, no complications were noted  Preformed by: Elberta Leatherwood, CMA  Follow up: 1 month

## 2018-01-26 DIAGNOSIS — M545 Low back pain: Secondary | ICD-10-CM | POA: Diagnosis not present

## 2018-01-26 DIAGNOSIS — M5416 Radiculopathy, lumbar region: Secondary | ICD-10-CM | POA: Diagnosis not present

## 2018-02-06 ENCOUNTER — Ambulatory Visit (INDEPENDENT_AMBULATORY_CARE_PROVIDER_SITE_OTHER): Payer: Medicare Other | Admitting: Family Medicine

## 2018-02-06 ENCOUNTER — Encounter: Payer: Self-pay | Admitting: Family Medicine

## 2018-02-06 VITALS — BP 130/71 | HR 65 | Temp 98.5°F | Resp 16 | Wt 144.0 lb

## 2018-02-06 DIAGNOSIS — Z23 Encounter for immunization: Secondary | ICD-10-CM

## 2018-02-06 DIAGNOSIS — I2510 Atherosclerotic heart disease of native coronary artery without angina pectoris: Secondary | ICD-10-CM

## 2018-02-06 DIAGNOSIS — D692 Other nonthrombocytopenic purpura: Secondary | ICD-10-CM | POA: Diagnosis not present

## 2018-02-06 NOTE — Progress Notes (Signed)
Patient: Ivan Salazar Male    DOB: [birth year]   84 y.o.   MRN: [id number] Visit Date: 02/06/2018  Today's Provider: Lelon Huh, MD   Chief Complaint  Patient presents with  . Rash    x 3 days   Subjective:    Rash  This is a new problem. Episode onset: 3 days ago. The problem has been gradually worsening since onset. Location: side nof right knee. The rash is characterized by bruising. Pertinent negatives include no anorexia, congestion, cough, diarrhea, eye pain, facial edema, fatigue, fever, joint pain, nail changes, rhinorrhea, shortness of breath, sore throat or vomiting.  He was on CROP walk 3 days ago and is concerned about RMSF. Has not had any unusually muscle or joint aches, no fevers, no headaches or other signs of illness.      Allergies  Allergen Reactions  . Celebrex [Celecoxib]     Noxius taste in mouth  . Mirtazapine     Bad taste in mouth. Refuses to take again     Current Outpatient Medications:  .  atorvastatin (LIPITOR) 80 MG tablet, TAKE 1 TABLET DAILY, Disp: 90 tablet, Rfl: 4 .  clopidogrel (PLAVIX) 75 MG tablet, TAKE 1 TABLET BY MOUTH DAILY, Disp: 90 tablet, Rfl: 4 .  enalapril (VASOTEC) 5 MG tablet, TAKE ONE TABLET BY MOUTH DAILY, Disp: 90 tablet, Rfl: 4 .  fluticasone (FLONASE) 50 MCG/ACT nasal spray, PLACE 2 SPRAYS INTO BOTH NOSTRILS DAILY., Disp: 17 g, Rfl: 5 .  furosemide (LASIX) 20 MG tablet, Take 0.5 tablets (10 mg total) by mouth daily as needed for edema., Disp: 1 tablet, Rfl: 0 .  guaiFENesin-codeine (CHERATUSSIN AC) 100-10 MG/5ML syrup, TAKE 1-2 TEASPOONFULS EVERY 6 HOURS AS NEEDED FOR COUGH, Disp: 180 mL, Rfl: 2 .  HYDROcodone-acetaminophen (NORCO) 7.5-325 MG tablet, 1 tablet every 4-6 hours as needed, Disp: 180 tablet, Rfl: 0 .  hydrocortisone 2.5 % cream, Apply topically 2 (two) times daily as needed., Disp: 30 g, Rfl: 0 .  latanoprost (XALATAN) 0.005 % ophthalmic solution, Place 1 drop into both eyes at bedtime. , Disp: , Rfl:  .   metoprolol succinate (TOPROL-XL) 25 MG 24 hr tablet, Take 1 tablet (25 mg total) by mouth daily., Disp: 90 tablet, Rfl: 4 .  nitroGLYCERIN (NITROSTAT) 0.4 MG SL tablet, Place 1 tablet (0.4 mg total) under the tongue every 5 (five) minutes as needed for chest pain., Disp: 20 tablet, Rfl: 1 .  ranitidine (ZANTAC) 300 MG tablet, TAKE 1 TABLET TWICE DAILY, Disp: 180 tablet, Rfl: 4 .  testosterone cypionate (DEPOTESTOSTERONE CYPIONATE) 200 MG/ML injection, Inject 1 mL (200 mg total) into the muscle every 28 (twenty-eight) days., Disp: 10 mL, Rfl: 1 .  zolpidem (AMBIEN) 10 MG tablet, TAKE 0.5-1 TABLET BY MOUTH EVERY NIGHT AT BEDTIME, Disp: 30 tablet, Rfl: 4  Review of Systems  Constitutional: Negative for fatigue and fever.  HENT: Negative for congestion, rhinorrhea and sore throat.   Eyes: Negative for pain.  Respiratory: Negative for cough and shortness of breath.   Gastrointestinal: Negative for anorexia, diarrhea and vomiting.  Musculoskeletal: Negative for joint pain.  Skin: Positive for rash. Negative for nail changes.  Hematological: Bruises/bleeds easily.    Social History   Tobacco Use  . Smoking status: Former Smoker    Packs/day: 0.50    Years: 20.00    Pack years: 10.00    Last attempt to quit: 05/01/1977    Years since quitting: 40.7  . Smokeless  tobacco: Never Used  Substance Use Topics  . Alcohol use: Yes    Alcohol/week: 14.0 standard drinks    Types: 14 Shots of liquor per week    Comment: moderate; 2 drinks daily   Objective:   BP 130/71 (BP Location: Left Arm, Patient Position: Sitting, Cuff Size: Normal)   Pulse 65   Temp 98.5 F (36.9 C) (Oral)   Resp 16   Wt 144 lb (65.3 kg)   SpO2 99% Comment: room air  BMI 20.66 kg/m  Vitals:   02/06/18 0813  BP: 130/71  Pulse: 65  Resp: 16  Temp: 98.5 F (36.9 C)  TempSrc: Oral  SpO2: 99%  Weight: 144 lb (65.3 kg)     Physical Exam   Nickel sized non-blanching well circumscribed purpuric macular lesion  medial aspect of right knee. No swelling, no tenderness. No other lesions.     Assessment & Plan:     1. Senile purpura ([place]) Reassurance.   2. Need for influenza vaccination        Lelon Huh, MD  [facility]

## 2018-02-09 DIAGNOSIS — M5416 Radiculopathy, lumbar region: Secondary | ICD-10-CM | POA: Diagnosis not present

## 2018-02-09 DIAGNOSIS — M545 Low back pain: Secondary | ICD-10-CM | POA: Diagnosis not present

## 2018-02-13 DIAGNOSIS — F4323 Adjustment disorder with mixed anxiety and depressed mood: Secondary | ICD-10-CM | POA: Diagnosis not present

## 2018-02-14 DIAGNOSIS — M5416 Radiculopathy, lumbar region: Secondary | ICD-10-CM | POA: Diagnosis not present

## 2018-02-14 DIAGNOSIS — M545 Low back pain: Secondary | ICD-10-CM | POA: Diagnosis not present

## 2018-02-16 DIAGNOSIS — M5416 Radiculopathy, lumbar region: Secondary | ICD-10-CM | POA: Diagnosis not present

## 2018-02-16 DIAGNOSIS — M545 Low back pain: Secondary | ICD-10-CM | POA: Diagnosis not present

## 2018-02-19 DIAGNOSIS — M545 Low back pain: Secondary | ICD-10-CM | POA: Diagnosis not present

## 2018-02-19 DIAGNOSIS — M5416 Radiculopathy, lumbar region: Secondary | ICD-10-CM | POA: Diagnosis not present

## 2018-02-21 ENCOUNTER — Ambulatory Visit: Payer: Medicare Other

## 2018-02-21 NOTE — Progress Notes (Unsigned)
Patient did bring medication to visit, injection was rescheduled

## 2018-02-22 DIAGNOSIS — Z951 Presence of aortocoronary bypass graft: Secondary | ICD-10-CM | POA: Diagnosis not present

## 2018-02-22 DIAGNOSIS — I2510 Atherosclerotic heart disease of native coronary artery without angina pectoris: Secondary | ICD-10-CM | POA: Diagnosis not present

## 2018-02-22 DIAGNOSIS — Z950 Presence of cardiac pacemaker: Secondary | ICD-10-CM | POA: Diagnosis not present

## 2018-02-22 DIAGNOSIS — M545 Low back pain: Secondary | ICD-10-CM | POA: Diagnosis not present

## 2018-02-22 DIAGNOSIS — I10 Essential (primary) hypertension: Secondary | ICD-10-CM | POA: Diagnosis not present

## 2018-02-22 DIAGNOSIS — R0602 Shortness of breath: Secondary | ICD-10-CM | POA: Diagnosis not present

## 2018-02-22 DIAGNOSIS — M5416 Radiculopathy, lumbar region: Secondary | ICD-10-CM | POA: Diagnosis not present

## 2018-02-22 DIAGNOSIS — I495 Sick sinus syndrome: Secondary | ICD-10-CM | POA: Diagnosis not present

## 2018-02-26 DIAGNOSIS — M5416 Radiculopathy, lumbar region: Secondary | ICD-10-CM | POA: Diagnosis not present

## 2018-02-26 DIAGNOSIS — M545 Low back pain: Secondary | ICD-10-CM | POA: Diagnosis not present

## 2018-02-28 ENCOUNTER — Ambulatory Visit (INDEPENDENT_AMBULATORY_CARE_PROVIDER_SITE_OTHER): Payer: Medicare Other

## 2018-02-28 DIAGNOSIS — E291 Testicular hypofunction: Secondary | ICD-10-CM | POA: Diagnosis not present

## 2018-02-28 MED ORDER — TESTOSTERONE CYPIONATE 200 MG/ML IM SOLN
200.0000 mg | Freq: Once | INTRAMUSCULAR | Status: AC
  Administered 2018-02-28: 200 mg via INTRAMUSCULAR

## 2018-02-28 NOTE — Progress Notes (Signed)
Testosterone IM Injection  Due to Hypogonadism patient is present today for a Testosterone Injection.  Medication: Testosterone Cypionate Dose: 62ml Location: right upper outer buttocks Lot: [id number] Exp:07/2019  Patient tolerated well, no complications were noted  Preformed by: Fonnie Jarvis, CMA  Follow up: 1 month

## 2018-03-01 DIAGNOSIS — M5416 Radiculopathy, lumbar region: Secondary | ICD-10-CM | POA: Diagnosis not present

## 2018-03-01 DIAGNOSIS — M545 Low back pain: Secondary | ICD-10-CM | POA: Diagnosis not present

## 2018-03-05 DIAGNOSIS — M545 Low back pain: Secondary | ICD-10-CM | POA: Diagnosis not present

## 2018-03-05 DIAGNOSIS — M5416 Radiculopathy, lumbar region: Secondary | ICD-10-CM | POA: Diagnosis not present

## 2018-03-08 ENCOUNTER — Ambulatory Visit (INDEPENDENT_AMBULATORY_CARE_PROVIDER_SITE_OTHER): Payer: Medicare Other | Admitting: Family Medicine

## 2018-03-08 ENCOUNTER — Encounter: Payer: Self-pay | Admitting: Family Medicine

## 2018-03-08 VITALS — BP 114/60 | HR 76 | Temp 97.9°F | Resp 16 | Wt 149.0 lb

## 2018-03-08 DIAGNOSIS — I2510 Atherosclerotic heart disease of native coronary artery without angina pectoris: Secondary | ICD-10-CM

## 2018-03-08 DIAGNOSIS — M5416 Radiculopathy, lumbar region: Secondary | ICD-10-CM | POA: Diagnosis not present

## 2018-03-08 DIAGNOSIS — R6889 Other general symptoms and signs: Secondary | ICD-10-CM

## 2018-03-08 DIAGNOSIS — M545 Low back pain: Secondary | ICD-10-CM | POA: Diagnosis not present

## 2018-03-08 DIAGNOSIS — M5137 Other intervertebral disc degeneration, lumbosacral region: Secondary | ICD-10-CM | POA: Diagnosis not present

## 2018-03-08 MED ORDER — HYDROCODONE-ACETAMINOPHEN 7.5-325 MG PO TABS: ORAL_TABLET | ORAL | 0 refills | Status: DC

## 2018-03-08 NOTE — Progress Notes (Signed)
Patient: Ivan Salazar Male    DOB: 09/09/1931   84 y.o.   MRN: [id number] Visit Date: 03/08/2018  Today's Provider: Lelon Huh, MD   Chief Complaint  Patient presents with  . Weight Loss  . Sciatica   Subjective:    Back Pain  This is a recurrent (Pt is coming in to update you on his left sided Sciatica.  He is currently doing PT twice a week at [facility] and will a second Cortisone shot soon on 03/15/2018. ) problem. The problem occurs intermittently. The problem is unchanged. The pain is present in the lumbar spine. The pain radiates to the left thigh. Associated symptoms include leg pain, numbness, weakness and weight loss. Pertinent negatives include no abdominal pain, bladder incontinence, bowel incontinence, chest pain, dysuria, fever, headaches, paresis, paresthesias, pelvic pain, perianal numbness or tingling. Treatments tried: Physical Therapy and Cortisone Shot. The treatment provided moderate relief.   Weight Loss  Pt is concerned about his recent weight loss.  He states his lowest weight was 140 at the cardiologist (02/22/2018). States he had been a large number of apples and had been have large BM but cut back about a week and BM have normalized. States he has no abdominal pain and appetite has been good.   Wt Readings from Last 20 Encounters:  03/08/18 149 lb (67.6 kg)  02/06/18 144 lb (65.3 kg)  11/27/17 151 lb 3.2 oz (68.6 kg)  11/14/17 149 lb (67.6 kg)  11/03/17 149 lb (67.6 kg)  10/26/17 153 lb 12.8 oz (69.8 kg)  09/01/17 146 lb (66.2 kg)  07/28/17 147 lb 3.2 oz (66.8 kg)  07/13/17 159 lb 3.2 oz (72.2 kg)  07/06/17 160 lb (72.6 kg)  06/27/17 154 lb (69.9 kg)  06/21/17 154 lb (69.9 kg)  06/14/17 157 lb (71.2 kg)  06/02/17 157 lb (71.2 kg)  05/19/17 156 lb (70.8 kg)  05/11/17 159 lb (72.1 kg)  04/17/17 152 lb (68.9 kg)  04/05/17 152 lb 12.8 oz (69.3 kg)  02/23/17 148 lb (67.1 kg)  01/11/17 148 lb (67.1 kg)           Allergies  Allergen  Reactions  . Celebrex [Celecoxib]     Noxius taste in mouth  . Mirtazapine     Bad taste in mouth. Refuses to take again     Current Outpatient Medications:  .  atorvastatin (LIPITOR) 80 MG tablet, TAKE 1 TABLET DAILY, Disp: 90 tablet, Rfl: 4 .  clopidogrel (PLAVIX) 75 MG tablet, TAKE 1 TABLET BY MOUTH DAILY, Disp: 90 tablet, Rfl: 4 .  enalapril (VASOTEC) 5 MG tablet, TAKE ONE TABLET BY MOUTH DAILY, Disp: 90 tablet, Rfl: 4 .  fluticasone (FLONASE) 50 MCG/ACT nasal spray, PLACE 2 SPRAYS INTO BOTH NOSTRILS DAILY., Disp: 17 g, Rfl: 5 .  furosemide (LASIX) 20 MG tablet, Take 0.5 tablets (10 mg total) by mouth daily as needed for edema., Disp: 1 tablet, Rfl: 0 .  guaiFENesin-codeine (CHERATUSSIN AC) 100-10 MG/5ML syrup, TAKE 1-2 TEASPOONFULS EVERY 6 HOURS AS NEEDED FOR COUGH, Disp: 180 mL, Rfl: 2 .  HYDROcodone-acetaminophen (NORCO) 7.5-325 MG tablet, 1 tablet every 4-6 hours as needed, Disp: 180 tablet, Rfl: 0 .  hydrocortisone 2.5 % cream, Apply topically 2 (two) times daily as needed., Disp: 30 g, Rfl: 0 .  latanoprost (XALATAN) 0.005 % ophthalmic solution, Place 1 drop into both eyes at bedtime. , Disp: , Rfl:  .  metoprolol succinate (TOPROL-XL) 25 MG 24 hr  tablet, Take 1 tablet (25 mg total) by mouth daily., Disp: 90 tablet, Rfl: 4 .  nitroGLYCERIN (NITROSTAT) 0.4 MG SL tablet, Place 1 tablet (0.4 mg total) under the tongue every 5 (five) minutes as needed for chest pain., Disp: 20 tablet, Rfl: 1 .  ranitidine (ZANTAC) 300 MG tablet, TAKE 1 TABLET TWICE DAILY, Disp: 180 tablet, Rfl: 4 .  testosterone cypionate (DEPOTESTOSTERONE CYPIONATE) 200 MG/ML injection, Inject 1 mL (200 mg total) into the muscle every 28 (twenty-eight) days., Disp: 10 mL, Rfl: 1 .  zolpidem (AMBIEN) 10 MG tablet, TAKE 0.5-1 TABLET BY MOUTH EVERY NIGHT AT BEDTIME, Disp: 30 tablet, Rfl: 4  Review of Systems  Constitutional: Positive for unexpected weight change (Pt reports he weigh 140 at the Cardiology.) and weight  loss. Negative for activity change, appetite change, chills, diaphoresis, fatigue and fever.  Cardiovascular: Negative for chest pain, palpitations and leg swelling.  Gastrointestinal: Positive for diarrhea (Pt reports this has resolved since he stopped eating so many apples. ). Negative for abdominal distention, abdominal pain and bowel incontinence.  Genitourinary: Negative for bladder incontinence, dysuria and pelvic pain.  Musculoskeletal: Positive for arthralgias, back pain, gait problem and myalgias. Negative for joint swelling, neck pain and neck stiffness.  Neurological: Positive for weakness and numbness. Negative for dizziness, tingling, light-headedness, headaches and paresthesias.    Social History   Tobacco Use  . Smoking status: Former Smoker    Packs/day: 0.50    Years: 20.00    Pack years: 10.00    Last attempt to quit: 05/01/1977    Years since quitting: 40.8  . Smokeless tobacco: Never Used  Substance Use Topics  . Alcohol use: Yes    Alcohol/week: 14.0 standard drinks    Types: 14 Shots of liquor per week    Comment: moderate; 2 drinks daily   Objective:   BP 114/60 (BP Location: Right Arm, Patient Position: Sitting, Cuff Size: Normal)   Pulse 76   Temp 97.9 F (36.6 C) (Oral)   Resp 16   Wt 149 lb (67.6 kg)   BMI 21.38 kg/m  Vitals:   03/08/18 1002  BP: 114/60  Pulse: 76  Resp: 16  Temp: 97.9 F (36.6 C)  TempSrc: Oral  Weight: 149 lb (67.6 kg)    Physical Exam   General Appearance:    Alert, cooperative, no distress  Eyes:    PERRL, conjunctiva/corneas clear, EOM's intact       Lungs:     Clear to auscultation bilaterally, respirations unlabored  Heart:    Regular rate and rhythm, trace bipedal edema.   Neurologic:   Awake, alert, oriented x 3. No apparent focal neurological           defect.          Assessment & Plan:     1. DDD (degenerative disc disease), lumbosacral Stable. Continue regular follow up with Dr. Sabra Heck and physical  therapy. refill- HYDROcodone-acetaminophen (NORCO) 7.5-325 MG tablet; 1 tablet every 4-6 hours as needed  Dispense: 180 tablet; Refill: 0  2. Fluctuation of weight Over weight has been running consistently 150-160 over the last year with some fluctuation, probably due to changes in diet and may be affected by steroid injection. No sign of any other systemic conditions. Will reassess in 3 months.        Lelon Huh, MD  [facility]

## 2018-03-09 ENCOUNTER — Telehealth: Payer: Self-pay

## 2018-03-09 NOTE — Telephone Encounter (Signed)
LMTCB and schedule AWV. Needs to be scheduled after 04/05/18. -MM

## 2018-03-12 DIAGNOSIS — M545 Low back pain: Secondary | ICD-10-CM | POA: Diagnosis not present

## 2018-03-12 DIAGNOSIS — M5416 Radiculopathy, lumbar region: Secondary | ICD-10-CM | POA: Diagnosis not present

## 2018-03-15 DIAGNOSIS — M25552 Pain in left hip: Secondary | ICD-10-CM | POA: Diagnosis not present

## 2018-03-15 DIAGNOSIS — M545 Low back pain: Secondary | ICD-10-CM | POA: Diagnosis not present

## 2018-03-15 DIAGNOSIS — M533 Sacrococcygeal disorders, not elsewhere classified: Secondary | ICD-10-CM | POA: Diagnosis not present

## 2018-03-15 DIAGNOSIS — M5416 Radiculopathy, lumbar region: Secondary | ICD-10-CM | POA: Diagnosis not present

## 2018-03-19 DIAGNOSIS — M5416 Radiculopathy, lumbar region: Secondary | ICD-10-CM | POA: Diagnosis not present

## 2018-03-19 DIAGNOSIS — M545 Low back pain: Secondary | ICD-10-CM | POA: Diagnosis not present

## 2018-03-20 DIAGNOSIS — F4323 Adjustment disorder with mixed anxiety and depressed mood: Secondary | ICD-10-CM | POA: Diagnosis not present

## 2018-03-23 ENCOUNTER — Other Ambulatory Visit: Payer: Self-pay | Admitting: Family Medicine

## 2018-03-23 DIAGNOSIS — M5137 Other intervertebral disc degeneration, lumbosacral region: Secondary | ICD-10-CM

## 2018-03-23 NOTE — Telephone Encounter (Signed)
°  Pt needing sooner refill on: HYDROcodone-acetaminophen (NORCO) 7.5-325 MG tablet  Pt is leaving on Nov. 26th - early morning.  Pt is scheduled for refill on 26th.  Pt is asking if the refill can be called in on the 25th so it can be picked up before his trip on 26th.  Please call pt back today or 1st thing Monday to let him know if this can be done by DrCaryn Section.  Please fill at:  [place], [place] [phone] (Phone) [phone] (Fax)    Thanks, [place]

## 2018-03-25 MED ORDER — HYDROCODONE-ACETAMINOPHEN 7.5-325 MG PO TABS: ORAL_TABLET | ORAL | 0 refills | Status: DC

## 2018-03-25 NOTE — Telephone Encounter (Signed)
Please advise have sent prescription to [name]

## 2018-03-28 ENCOUNTER — Ambulatory Visit: Payer: Medicare Other

## 2018-04-03 DIAGNOSIS — M5416 Radiculopathy, lumbar region: Secondary | ICD-10-CM | POA: Diagnosis not present

## 2018-04-03 DIAGNOSIS — M545 Low back pain: Secondary | ICD-10-CM | POA: Diagnosis not present

## 2018-04-04 ENCOUNTER — Ambulatory Visit: Payer: Medicare Other

## 2018-04-05 ENCOUNTER — Ambulatory Visit (INDEPENDENT_AMBULATORY_CARE_PROVIDER_SITE_OTHER): Payer: Medicare Other | Admitting: Family Medicine

## 2018-04-05 DIAGNOSIS — M545 Low back pain: Secondary | ICD-10-CM | POA: Diagnosis not present

## 2018-04-05 DIAGNOSIS — M5416 Radiculopathy, lumbar region: Secondary | ICD-10-CM | POA: Diagnosis not present

## 2018-04-05 DIAGNOSIS — E291 Testicular hypofunction: Secondary | ICD-10-CM | POA: Diagnosis not present

## 2018-04-05 MED ORDER — TESTOSTERONE CYPIONATE 200 MG/ML IM SOLN
200.0000 mg | Freq: Once | INTRAMUSCULAR | Status: AC
  Administered 2018-04-05: 200 mg via INTRAMUSCULAR

## 2018-04-05 NOTE — Progress Notes (Signed)
Testosterone IM Injection  Due to Hypogonadism patient is present today for a Testosterone Injection.  Medication: Testosterone Cypionate Dose: 1ML Location: right upper outer buttocks Lot: [id number] Exp:06/2019  Patient tolerated well, no complications were noted  Preformed by: Elberta Leatherwood, CMA  Follow up: 1 month

## 2018-04-06 ENCOUNTER — Ambulatory Visit: Payer: Medicare Other

## 2018-04-09 DIAGNOSIS — M545 Low back pain: Secondary | ICD-10-CM | POA: Diagnosis not present

## 2018-04-09 DIAGNOSIS — M5416 Radiculopathy, lumbar region: Secondary | ICD-10-CM | POA: Diagnosis not present

## 2018-04-10 DIAGNOSIS — I495 Sick sinus syndrome: Secondary | ICD-10-CM | POA: Diagnosis not present

## 2018-04-12 DIAGNOSIS — M5416 Radiculopathy, lumbar region: Secondary | ICD-10-CM | POA: Diagnosis not present

## 2018-04-12 DIAGNOSIS — M545 Low back pain: Secondary | ICD-10-CM | POA: Diagnosis not present

## 2018-04-16 DIAGNOSIS — M545 Low back pain: Secondary | ICD-10-CM | POA: Diagnosis not present

## 2018-04-16 DIAGNOSIS — M5416 Radiculopathy, lumbar region: Secondary | ICD-10-CM | POA: Diagnosis not present

## 2018-04-17 ENCOUNTER — Other Ambulatory Visit: Payer: Self-pay | Admitting: Family Medicine

## 2018-04-17 DIAGNOSIS — R05 Cough: Secondary | ICD-10-CM

## 2018-04-17 DIAGNOSIS — R053 Chronic cough: Secondary | ICD-10-CM

## 2018-04-18 ENCOUNTER — Ambulatory Visit (INDEPENDENT_AMBULATORY_CARE_PROVIDER_SITE_OTHER): Payer: Medicare Other

## 2018-04-18 ENCOUNTER — Other Ambulatory Visit: Payer: Self-pay

## 2018-04-18 ENCOUNTER — Other Ambulatory Visit: Payer: Self-pay | Admitting: Family Medicine

## 2018-04-18 VITALS — BP 122/52 | HR 80 | Temp 98.0°F | Ht 70.0 in | Wt 143.4 lb

## 2018-04-18 DIAGNOSIS — Z0000 Encounter for general adult medical examination without abnormal findings: Secondary | ICD-10-CM | POA: Diagnosis not present

## 2018-04-18 MED ORDER — ZOLPIDEM TARTRATE 10 MG PO TABS: ORAL_TABLET | ORAL | 3 refills | Status: DC

## 2018-04-18 NOTE — Progress Notes (Signed)
Subjective:   Ivan Salazar is a 84 y.o. male who presents for Medicare Annual/Subsequent preventive examination.  Review of Systems:  N/A  Cardiac Risk Factors include: advanced age (>23men, >58 women);dyslipidemia;hypertension;male gender;sedentary lifestyle     Objective:    Vitals: BP (!) 122/52 (BP Location: Right Arm)   Pulse 80   Temp 98 F (36.7 C) (Oral)   Ht 5\' 10"  (1.778 m)   Wt 143 lb 6.4 oz (65 kg)   BMI 20.58 kg/m   Body mass index is 20.58 kg/m.  Advanced Directives 04/18/2018 07/13/2017 07/06/2017 04/05/2017 10/14/2016 10/23/2014  Does Patient Have a Medical Advance Directive? Yes No No Yes No Yes  Type of Paramedic of [place];Living will - - Living will;Healthcare Power of Attorney - Living will;Healthcare Power of Attorney  Copy of [place] in Chart? No - copy requested - - No - copy requested - -  Would patient like information on creating a medical advance directive? - No - Patient declined - - - -    Tobacco Social History   Tobacco Use  Smoking Status Former Smoker  . Packs/day: 0.50  . Years: 20.00  . Pack years: 10.00  . Last attempt to quit: 05/01/1977  . Years since quitting: 40.9  Smokeless Tobacco Never Used     Counseling given: Not Answered   Clinical Intake:  Pre-visit preparation completed: Yes  Pain : No/denies pain Pain Score: 0-No pain     Nutritional Status: BMI of 19-24  Normal Nutritional Risks: None Diabetes: No  How often do you need to have someone help you when you read instructions, pamphlets, or other written materials from your doctor or pharmacy?: 1 - Never  Interpreter Needed?: No  Information entered by :: [facility], LPN  Past Medical History:  Diagnosis Date  . BPH (benign prostatic hyperplasia)   . CAD (coronary artery disease)   . CAD (coronary artery disease)   . Carpal tunnel syndrome   . DDD (degenerative disc disease), cervical   . Environmental and  seasonal allergies   . GERD (gastroesophageal reflux disease)   . Glaucoma   . HLD (hyperlipidemia)   . Hypertension   . Hypogonadism in male   . Impotence   . Nocturia   . Weight loss    Past Surgical History:  Procedure Laterality Date  . APPENDECTOMY    . BILATERAL CARPAL TUNNEL RELEASE     05/12/2011, 06/09/2011  . CARDIAC CATHETERIZATION  2004  . coils in vessel     6 coils  . CORONARY ANGIOPLASTY     x 5  . CORONARY ARTERY BYPASS GRAFT  2007   5; [facility]  . CORONARY STENT PLACEMENT  2005  . ESOPHAGOGASTRODUODENOSCOPY (EGD) WITH PROPOFOL N/A 10/28/2015   Procedure: ESOPHAGOGASTRODUODENOSCOPY (EGD) WITH PROPOFOL;  Surgeon: Manya Silvas, MD;  Location: [facility] ENDOSCOPY;  Service: Endoscopy;  Laterality: N/A;  . KIDNEY STONE SURGERY    . PACEMAKER INSERTION N/A 07/13/2017   Procedure: INSERTION PACEMAKER;  Surgeon: Isaias Cowman, MD;  Location: [facility] ORS;  Service: Cardiovascular;  Laterality: N/A;  . SHOULDER ARTHROSCOPY W/ ROTATOR CUFF REPAIR Bilateral   . TONSILLECTOMY    . UPPER GI ENDOSCOPY  08/02/2010   Dr. Tedra Coupe, gastritis. H Pylori negative   Family History  Problem Relation Age of Onset  . Cirrhosis Father        of liver  . Kidney disease Neg Hx   . Prostate cancer Neg Hx   .  Kidney cancer Neg Hx   . Bladder Cancer Neg Hx    Social History   Socioeconomic History  . Marital status: Widowed    Spouse name: Not on file  . Number of children: 5  . Years of education: Not on file  . Highest education level: Master's degree (e.g., MA, MS, MEng, MEd, MSW, MBA)  Occupational History  . Occupation: Retired  Scientific laboratory technician  . Financial resource strain: Not hard at all  . Food insecurity:    Worry: Never true    Inability: Never true  . Transportation needs:    Medical: No    Non-medical: No  Tobacco Use  . Smoking status: Former Smoker    Packs/day: 0.50    Years: 20.00    Pack years: 10.00    Last attempt to quit: 05/01/1977    Years since  quitting: 40.9  . Smokeless tobacco: Never Used  Substance and Sexual Activity  . Alcohol use: Yes    Alcohol/week: 14.0 standard drinks    Types: 14 Shots of liquor per week    Comment: moderate; 2 drinks daily  . Drug use: No  . Sexual activity: Not on file  Lifestyle  . Physical activity:    Days per week: 0 days    Minutes per session: 0 min  . Stress: To some extent  Relationships  . Social connections:    Talks on phone: Patient refused    Gets together: Patient refused    Attends religious service: Patient refused    Active member of club or organization: Patient refused    Attends meetings of clubs or organizations: Patient refused    Relationship status: Patient refused  Other Topics Concern  . Not on file  Social History Narrative  . Not on file    Outpatient Encounter Medications as of 04/18/2018  Medication Sig  . atorvastatin (LIPITOR) 80 MG tablet TAKE 1 TABLET DAILY  . clopidogrel (PLAVIX) 75 MG tablet TAKE 1 TABLET BY MOUTH DAILY  . enalapril (VASOTEC) 5 MG tablet TAKE ONE TABLET BY MOUTH DAILY  . fluticasone (FLONASE) 50 MCG/ACT nasal spray PLACE 2 SPRAYS INTO BOTH NOSTRILS DAILY.  Marland Kitchen guaiFENesin-codeine (ROBITUSSIN AC) 100-10 MG/5ML syrup TAKE 1-2 TEASPOONS EVERY 6 HOURS AS NEEDED FOR COUGH  . HYDROcodone-acetaminophen (NORCO) 7.5-325 MG tablet 1 tablet every 4-6 hours as needed  . hydrocortisone 2.5 % cream Apply topically 2 (two) times daily as needed.  . latanoprost (XALATAN) 0.005 % ophthalmic solution Place 1 drop into both eyes at bedtime.   . metoprolol succinate (TOPROL-XL) 25 MG 24 hr tablet Take 1 tablet (25 mg total) by mouth daily.  . nitroGLYCERIN (NITROSTAT) 0.4 MG SL tablet Place 1 tablet (0.4 mg total) under the tongue every 5 (five) minutes as needed for chest pain.  . ranitidine (ZANTAC) 300 MG tablet TAKE 1 TABLET TWICE DAILY  . testosterone cypionate (DEPOTESTOSTERONE CYPIONATE) 200 MG/ML injection Inject 1 mL (200 mg total) into the  muscle every 28 (twenty-eight) days.  . [DISCONTINUED] zolpidem (AMBIEN) 10 MG tablet TAKE 0.5-1 TABLET BY MOUTH EVERY NIGHT AT BEDTIME  . furosemide (LASIX) 20 MG tablet Take 0.5 tablets (10 mg total) by mouth daily as needed for edema. (Patient not taking: Reported on 03/08/2018)   No facility-administered encounter medications on file as of 04/18/2018.     Activities of Daily Living In your present state of health, do you have any difficulty performing the following activities: 04/18/2018 11/27/2017  Hearing? N Y  Vision?  N N  Comment Wears eye glasses.  -  Difficulty concentrating or making decisions? N N  Walking or climbing stairs? Y Y  Comment Due to back pain and balance issues.  -  Dressing or bathing? N N  Doing errands, shopping? N N  Preparing Food and eating ? N -  Using the Toilet? N -  In the past six months, have you accidently leaked urine? N -  Do you have problems with loss of bowel control? N -  Managing your Medications? N -  Managing your Finances? N -  Housekeeping or managing your Housekeeping? N -  Some recent data might be hidden    Patient Care Team: Birdie Sons, MD as PCP - General (Family Medicine) Isaias Cowman, MD as Consulting Physician (Cardiology) Eulogio Bear, MD as Consulting Physician (Ophthalmology) Manya Silvas, MD (Gastroenterology)   Assessment:   This is a routine wellness examination for Ivan Salazar.  Exercise Activities and Dietary recommendations Current Exercise Habits: Home exercise routine, Type of exercise: walking, Time (Minutes): 20, Frequency (Times/Week): 2, Weekly Exercise (Minutes/Week): 40, Intensity: Mild, Exercise limited by: orthopedic condition(s)  Goals    . DIET - INCREASE WATER INTAKE     Recommend to drink at least 6-8 8oz glasses of water per day.        . Exercise 3x per week (30 min per time)     Recommend for pt to start back walking and stretching 3 days a week for at least 30 minutes.          Fall Risk Fall Risk  04/18/2018 11/27/2017 04/05/2017 09/14/2016 03/03/2015  Falls in the past year? 0 Yes Yes No Yes  Number falls in past yr: 0 1 2 or more - 2 or more  Injury with Fall? 0 Yes No - Yes  Follow up - - Falls prevention discussed - Falls evaluation completed   FALL RISK PREVENTION PERTAINING TO THE HOME:  Any stairs in or around the home WITH handrails? No  Home free of loose throw rugs in walkways, pet beds, electrical cords, etc? Yes  Adequate lighting in your home to reduce risk of falls? Yes   ASSISTIVE DEVICES UTILIZED TO PREVENT FALLS:  Life alert? No  Use of a cane, walker or w/c? No  Grab bars in the bathroom? Yes  Shower chair or bench in shower? No  Elevated toilet seat or a handicapped toilet? No    TIMED UP AND GO:  Was the test performed? No .    Depression Screen PHQ 2/9 Scores 04/18/2018 11/27/2017 04/05/2017 09/14/2016  PHQ - 2 Score 1 1 0 0  PHQ- 9 Score - 3 - -    Cognitive Function: Declined today.         Immunization History  Administered Date(s) Administered  . Influenza, High Dose Seasonal PF 02/17/2015, 01/11/2017, 02/06/2018  . Influenza-Unspecified 01/29/2016  . Pneumococcal Conjugate-13 05/11/2017  . Pneumococcal Polysaccharide-23 05/29/2007  . Td 03/20/2002, 02/19/2008, 10/26/2017  . Zoster 02/09/2010    Qualifies for Shingles Vaccine? Yes  Zostavax completed 02/09/10. Due for Shingrix. Education has been provided regarding the importance of this vaccine. Pt has been advised to call insurance company to determine out of pocket expense. Advised may also receive vaccine at local pharmacy or Health Dept. Verbalized acceptance and understanding.  Tdap: Up to date  Flu Vaccine: Up to date  Pneumococcal Vaccine: Up to date   Screening Tests Health Maintenance  Topic Date Due  . TETANUS/TDAP  10/27/2027  . INFLUENZA VACCINE  Completed  . PNA vac Low Risk Adult  Completed   Cancer Screenings:  Colorectal  Screening: No longer required. .  Lung Cancer Screening: (Low Dose CT Chest recommended if Age 74-80 years, 30 pack-year currently smoking OR have quit w/in 15years.) does not qualify.    Additional Screening:  Vision Screening: Recommended annual ophthalmology exams for early detection of glaucoma and other disorders of the eye.  Dental Screening: Recommended annual dental exams for proper oral hygiene  Community Resource Referral:  CRR required this visit?  No        Plan:  I have personally reviewed and addressed the Medicare Annual Wellness questionnaire and have noted the following in the patient's chart:  A. Medical and social history B. Use of alcohol, tobacco or illicit drugs  C. Current medications and supplements D. Functional ability and status E.  Nutritional status F.  Physical activity G. Advance directives H. List of other physicians I.  Hospitalizations, surgeries, and ER visits in previous 12 months J.  [place] such as hearing and vision if needed, cognitive and depression L. Referrals and appointments - none  In addition, I have reviewed and discussed with patient certain preventive protocols, quality metrics, and best practice recommendations. A written personalized care plan for preventive services as well as general preventive health recommendations were provided to patient.  See attached scanned questionnaire for additional information.   Signed,  Fabio Neighbors, LPN Nurse Health Advisor   Nurse Recommendations: None.

## 2018-04-18 NOTE — Telephone Encounter (Signed)
Pt was seen in office today for his AWV and states he put in a request to [name] this morning for a refill on his Ambien. Advised pt they typically like the request to be placed 24-48 hours prior to running out. Pt states he needs this prescription tonight. [name] on [address].  Also pt is requesting a typed letter to be sent to [facility] stating he needs the Norco on a tier 1 co-pay level. Pt is changing from [place] to [facility] in 2020. Pt states PCP sends a letter to [employer] for this every year. Copy of approval letter for Norco in 2018 placed in your box. Please advise, thanks. -MM

## 2018-04-18 NOTE — Patient Instructions (Signed)
[name] , Thank you for taking time to come for your Medicare Wellness Visit. I appreciate your ongoing commitment to your health goals. Please review the following plan we discussed and let me know if I can assist you in the future.   Screening recommendations/referrals: Colonoscopy: No longer required.  Recommended yearly ophthalmology/optometry visit for glaucoma screening and checkup Recommended yearly dental visit for hygiene and checkup  Vaccinations: Influenza vaccine: Up to date Pneumococcal vaccine: Completed series Tdap vaccine: Up to date, due 10/2027 Shingles vaccine: Pt declines today.     Advanced directives: Please bring a copy of your POA (Power of Attorney) and/or Living Will to your next appointment.   Conditions/risks identified: Recommend to drink at least 6-8 8oz glasses of water per day.  Next appointment: 05/08/18 with Dr Ivan Salazar.   Preventive Care 8 Years and Older, Male Preventive care refers to lifestyle choices and visits with your health care provider that can promote health and wellness. What does preventive care include?  A yearly physical exam. This is also called an annual well check.  Dental exams once or twice a year.  Routine eye exams. Ask your health care provider how often you should have your eyes checked.  Personal lifestyle choices, including:  Daily care of your teeth and gums.  Regular physical activity.  Eating a healthy diet.  Avoiding tobacco and drug use.  Limiting alcohol use.  Practicing safe sex.  Taking low doses of aspirin every day.  Taking vitamin and mineral supplements as recommended by your health care provider. What happens during an annual well check? The services and screenings done by your health care provider during your annual well check will depend on your age, overall health, lifestyle risk factors, and family history of disease. Counseling  Your health care provider may ask you questions about  your:  Alcohol use.  Tobacco use.  Drug use.  Emotional well-being.  Home and relationship well-being.  Sexual activity.  Eating habits.  History of falls.  Memory and ability to understand (cognition).  Work and work Statistician. Screening  You may have the following tests or measurements:  Height, weight, and BMI.  Blood pressure.  Lipid and cholesterol levels. These may be checked every 5 years, or more frequently if you are over 24 years old.  Skin check.  Lung cancer screening. You may have this screening every year starting at age 76 if you have a 30-pack-year history of smoking and currently smoke or have quit within the past 15 years.  Fecal occult blood test (FOBT) of the stool. You may have this test every year starting at age 26.  Flexible sigmoidoscopy or colonoscopy. You may have a sigmoidoscopy every 5 years or a colonoscopy every 10 years starting at age 59.  Prostate cancer screening. Recommendations will vary depending on your family history and other risks.  Hepatitis C blood test.  Hepatitis B blood test.  Sexually transmitted disease (STD) testing.  Diabetes screening. This is done by checking your blood sugar (glucose) after you have not eaten for a while (fasting). You may have this done every 1-3 years.  Abdominal aortic aneurysm (AAA) screening. You may need this if you are a current or former smoker.  Osteoporosis. You may be screened starting at age 17 if you are at high risk. Talk with your health care provider about your test results, treatment options, and if necessary, the need for more tests. Vaccines  Your health care provider may recommend certain vaccines, such  as:  Influenza vaccine. This is recommended every year.  Tetanus, diphtheria, and acellular pertussis (Tdap, Td) vaccine. You may need a Td booster every 10 years.  Zoster vaccine. You may need this after age 58.  Pneumococcal 13-valent conjugate (PCV13) vaccine.  One dose is recommended after age 48.  Pneumococcal polysaccharide (PPSV23) vaccine. One dose is recommended after age 52. Talk to your health care provider about which screenings and vaccines you need and how often you need them. This information is not intended to replace advice given to you by your health care provider. Make sure you discuss any questions you have with your health care provider. Document Released: 05/15/2015 Document Revised: 01/06/2016 Document Reviewed: 02/17/2015 Elsevier Interactive Patient Education  2017 [place] Prevention in the Home Falls can cause injuries. They can happen to people of all ages. There are many things you can do to make your home safe and to help prevent falls. What can I do on the outside of my home?  Regularly fix the edges of walkways and driveways and fix any cracks.  Remove anything that might make you trip as you walk through a door, such as a raised step or threshold.  Trim any bushes or trees on the path to your home.  Use bright outdoor lighting.  Clear any walking paths of anything that might make someone trip, such as rocks or tools.  Regularly check to see if handrails are loose or broken. Make sure that both sides of any steps have handrails.  Any raised decks and porches should have guardrails on the edges.  Have any leaves, snow, or ice cleared regularly.  Use sand or salt on walking paths during winter.  Clean up any spills in your garage right away. This includes oil or grease spills. What can I do in the bathroom?  Use night lights.  Install grab bars by the toilet and in the tub and shower. Do not use towel bars as grab bars.  Use non-skid mats or decals in the tub or shower.  If you need to sit down in the shower, use a plastic, non-slip stool.  Keep the floor dry. Clean up any water that spills on the floor as soon as it happens.  Remove soap buildup in the tub or shower regularly.  Attach bath  mats securely with double-sided non-slip rug tape.  Do not have throw rugs and other things on the floor that can make you trip. What can I do in the bedroom?  Use night lights.  Make sure that you have a light by your bed that is easy to reach.  Do not use any sheets or blankets that are too big for your bed. They should not hang down onto the floor.  Have a firm chair that has side arms. You can use this for support while you get dressed.  Do not have throw rugs and other things on the floor that can make you trip. What can I do in the kitchen?  Clean up any spills right away.  Avoid walking on wet floors.  Keep items that you use a lot in easy-to-reach places.  If you need to reach something above you, use a strong step stool that has a grab bar.  Keep electrical cords out of the way.  Do not use floor polish or wax that makes floors slippery. If you must use wax, use non-skid floor wax.  Do not have throw rugs and other things on the  floor that can make you trip. What can I do with my stairs?  Do not leave any items on the stairs.  Make sure that there are handrails on both sides of the stairs and use them. Fix handrails that are broken or loose. Make sure that handrails are as long as the stairways.  Check any carpeting to make sure that it is firmly attached to the stairs. Fix any carpet that is loose or worn.  Avoid having throw rugs at the top or bottom of the stairs. If you do have throw rugs, attach them to the floor with carpet tape.  Make sure that you have a light switch at the top of the stairs and the bottom of the stairs. If you do not have them, ask someone to add them for you. What else can I do to help prevent falls?  Wear shoes that:  Do not have high heels.  Have rubber bottoms.  Are comfortable and fit you well.  Are closed at the toe. Do not wear sandals.  If you use a stepladder:  Make sure that it is fully opened. Do not climb a closed  stepladder.  Make sure that both sides of the stepladder are locked into place.  Ask someone to hold it for you, if possible.  Clearly mark and make sure that you can see:  Any grab bars or handrails.  First and last steps.  Where the edge of each step is.  Use tools that help you move around (mobility aids) if they are needed. These include:  Canes.  Walkers.  Scooters.  Crutches.  Turn on the lights when you go into a dark area. Replace any light bulbs as soon as they burn out.  Set up your furniture so you have a clear path. Avoid moving your furniture around.  If any of your floors are uneven, fix them.  If there are any pets around you, be aware of where they are.  Review your medicines with your doctor. Some medicines can make you feel dizzy. This can increase your chance of falling. Ask your doctor what other things that you can do to help prevent falls. This information is not intended to replace advice given to you by your health care provider. Make sure you discuss any questions you have with your health care provider. Document Released: 02/12/2009 Document Revised: 09/24/2015 Document Reviewed: 05/23/2014 Elsevier Interactive Patient Education  2017 [employer].

## 2018-04-27 ENCOUNTER — Ambulatory Visit (INDEPENDENT_AMBULATORY_CARE_PROVIDER_SITE_OTHER): Payer: Medicare Other | Admitting: Family Medicine

## 2018-04-27 ENCOUNTER — Encounter: Payer: Self-pay | Admitting: Family Medicine

## 2018-04-27 VITALS — BP 108/64 | HR 60 | Temp 98.7°F | Resp 16 | Ht 70.0 in | Wt 139.0 lb

## 2018-04-27 DIAGNOSIS — I2510 Atherosclerotic heart disease of native coronary artery without angina pectoris: Secondary | ICD-10-CM | POA: Diagnosis not present

## 2018-04-27 DIAGNOSIS — R634 Abnormal weight loss: Secondary | ICD-10-CM

## 2018-04-27 NOTE — Progress Notes (Signed)
Patient: Ivan Salazar Male    DOB: 22-Nov-1931   84 y.o.   MRN: [id number] Visit Date: 04/27/2018  Today's Provider: Lelon Huh, MD   Chief Complaint  Patient presents with  . Weight Loss   Subjective:     HPI  Patient comes in today c/o abnormal weight loss. He reports that he is slowly loosing weight and doesn't know why. He has lost 4 pounds in the last 4 weeks. He denies any changes in his eating habits. He reports that he has loose bowels, but it is not watery.  He reports that he has stopped taking his diuretic a few months ago and has had no dyspnea or swelling.   States he has vague changes in bowel movements 'like I'm being cleaned completely out.' No diarrhea or blood in stool.    Wt Readings from Last 5 Encounters:  04/27/18 139 lb (63 kg)  04/18/18 143 lb 6.4 oz (65 kg)  03/08/18 149 lb (67.6 kg)  02/06/18 144 lb (65.3 kg)  11/27/17 151 lb 3.2 oz (68.6 kg)     Allergies  Allergen Reactions  . Celebrex [Celecoxib]     Noxius taste in mouth  . Mirtazapine     Bad taste in mouth. Refuses to take again     Current Outpatient Medications:  .  atorvastatin (LIPITOR) 80 MG tablet, TAKE 1 TABLET DAILY, Disp: 90 tablet, Rfl: 4 .  clopidogrel (PLAVIX) 75 MG tablet, TAKE 1 TABLET BY MOUTH DAILY, Disp: 90 tablet, Rfl: 4 .  enalapril (VASOTEC) 5 MG tablet, TAKE ONE TABLET BY MOUTH DAILY, Disp: 90 tablet, Rfl: 4 .  fluticasone (FLONASE) 50 MCG/ACT nasal spray, PLACE 2 SPRAYS INTO BOTH NOSTRILS DAILY., Disp: 17 g, Rfl: 5 .  guaiFENesin-codeine (ROBITUSSIN AC) 100-10 MG/5ML syrup, TAKE 1-2 TEASPOONS EVERY 6 HOURS AS NEEDED FOR COUGH, Disp: 180 mL, Rfl: 5 .  HYDROcodone-acetaminophen (NORCO) 7.5-325 MG tablet, 1 tablet every 4-6 hours as needed, Disp: 180 tablet, Rfl: 0 .  hydrocortisone 2.5 % cream, Apply topically 2 (two) times daily as needed., Disp: 30 g, Rfl: 0 .  latanoprost (XALATAN) 0.005 % ophthalmic solution, Place 1 drop into both eyes at bedtime. ,  Disp: , Rfl:  .  metoprolol succinate (TOPROL-XL) 25 MG 24 hr tablet, Take 1 tablet (25 mg total) by mouth daily., Disp: 90 tablet, Rfl: 4 .  nitroGLYCERIN (NITROSTAT) 0.4 MG SL tablet, Place 1 tablet (0.4 mg total) under the tongue every 5 (five) minutes as needed for chest pain., Disp: 20 tablet, Rfl: 1 .  ranitidine (ZANTAC) 300 MG tablet, TAKE 1 TABLET TWICE DAILY, Disp: 180 tablet, Rfl: 4 .  testosterone cypionate (DEPOTESTOSTERONE CYPIONATE) 200 MG/ML injection, Inject 1 mL (200 mg total) into the muscle every 28 (twenty-eight) days., Disp: 10 mL, Rfl: 1 .  zolpidem (AMBIEN) 10 MG tablet, TAKE 1/2 TO 1 TABLET EVERY NIGHT AT BEDTIME, Disp: 30 tablet, Rfl: 3 .  furosemide (LASIX) 20 MG tablet, Take 0.5 tablets (10 mg total) by mouth daily as needed for edema. (Patient not taking: Reported on 03/08/2018), Disp: 1 tablet, Rfl: 0  Review of Systems  Constitutional: Negative for activity change, appetite change, chills, fatigue, fever and unexpected weight change.  Respiratory: Negative for cough and shortness of breath.   Cardiovascular: Negative for chest pain, palpitations and leg swelling.  Gastrointestinal: Positive for constipation, diarrhea and nausea. Negative for abdominal distention, abdominal pain, anal bleeding, blood in stool, rectal pain and vomiting.  Genitourinary: Negative.   Musculoskeletal: Negative.   Neurological: Negative for dizziness, light-headedness and headaches.    Social History   Tobacco Use  . Smoking status: Former Smoker    Packs/day: 0.50    Years: 20.00    Pack years: 10.00    Last attempt to quit: 05/01/1977    Years since quitting: 41.0  . Smokeless tobacco: Never Used  Substance Use Topics  . Alcohol use: Yes    Alcohol/week: 14.0 standard drinks    Types: 14 Shots of liquor per week    Comment: moderate; 2 drinks daily      Objective:   BP 108/64 (BP Location: Left Arm, Patient Position: Sitting, Cuff Size: Normal)   Pulse 60   Temp 98.7 F  (37.1 C)   Resp 16   Ht 5\' 10"  (1.778 m)   Wt 139 lb (63 kg)   SpO2 100%   BMI 19.94 kg/m  Vitals:   04/27/18 0854  BP: 108/64  Pulse: 60  Resp: 16  Temp: 98.7 F (37.1 C)  SpO2: 100%  Weight: 139 lb (63 kg)  Height: 5\' 10"  (1.778 m)     Physical Exam   General Appearance:    Alert, cooperative, no distress  Eyes:    PERRL, conjunctiva/corneas clear, EOM's intact       Lungs:     Clear to auscultation bilaterally, respirations unlabored  Heart:    Regular rate and rhythm  Neurologic:   Awake, alert, oriented x 3. No apparent focal neurological           defect.          Assessment & Plan    1. Loss of weight Unremarkable exam. PSA is followed by urology and last checked in February.   - Comprehensive metabolic panel - T4, free - TSH - CBC with Differential/Platelet  Consider GI imaging or referral if labs normal.       Lelon Huh, MD  [place]

## 2018-04-27 NOTE — Patient Instructions (Signed)
.   Please bring all of your medications to every appointment so we can make sure that our medication list is the same as yours.   

## 2018-04-28 LAB — CBC WITH DIFFERENTIAL/PLATELET
Basophils Absolute: 0 10*3/uL (ref 0.0–0.2)
Basos: 0 %
EOS (ABSOLUTE): 0.1 10*3/uL (ref 0.0–0.4)
Eos: 1 %
HEMOGLOBIN: 12.8 g/dL — AB (ref 13.0–17.7)
Hematocrit: 37.6 % (ref 37.5–51.0)
Immature Grans (Abs): 0 10*3/uL (ref 0.0–0.1)
Immature Granulocytes: 0 %
LYMPHS: 14 %
Lymphocytes Absolute: 0.7 10*3/uL (ref 0.7–3.1)
MCH: 33.2 pg — ABNORMAL HIGH (ref 26.6–33.0)
MCHC: 34 g/dL (ref 31.5–35.7)
MCV: 98 fL — ABNORMAL HIGH (ref 79–97)
MONOCYTES: 10 %
Monocytes Absolute: 0.5 10*3/uL (ref 0.1–0.9)
Neutrophils Absolute: 3.6 10*3/uL (ref 1.4–7.0)
Neutrophils: 75 %
Platelets: 161 10*3/uL (ref 150–450)
RBC: 3.85 x10E6/uL — AB (ref 4.14–5.80)
RDW: 12.7 % (ref 12.3–15.4)
WBC: 4.8 10*3/uL (ref 3.4–10.8)

## 2018-04-28 LAB — COMPREHENSIVE METABOLIC PANEL
ALBUMIN: 4.4 g/dL (ref 3.5–4.7)
ALT: 16 IU/L (ref 0–44)
AST: 14 IU/L (ref 0–40)
Albumin/Globulin Ratio: 2 (ref 1.2–2.2)
Alkaline Phosphatase: 66 IU/L (ref 39–117)
BUN/Creatinine Ratio: 17 (ref 10–24)
BUN: 23 mg/dL (ref 8–27)
Bilirubin Total: 1.5 mg/dL — ABNORMAL HIGH (ref 0.0–1.2)
CO2: 19 mmol/L — ABNORMAL LOW (ref 20–29)
Calcium: 9.3 mg/dL (ref 8.6–10.2)
Chloride: 103 mmol/L (ref 96–106)
Creatinine, Ser: 1.39 mg/dL — ABNORMAL HIGH (ref 0.76–1.27)
GFR calc Af Amer: 53 mL/min/{1.73_m2} — ABNORMAL LOW (ref >59)
GFR calc non Af Amer: 46 mL/min/{1.73_m2} — ABNORMAL LOW (ref >59)
Globulin, Total: 2.2 g/dL (ref 1.5–4.5)
Glucose: 92 mg/dL (ref 65–99)
Potassium: 5.2 mmol/L (ref 3.5–5.2)
Sodium: 139 mmol/L (ref 134–144)
Total Protein: 6.6 g/dL (ref 6.0–8.5)

## 2018-04-28 LAB — TSH: TSH: 2.28 u[IU]/mL (ref 0.450–4.500)

## 2018-04-28 LAB — T4, FREE: Free T4: 1.45 ng/dL (ref 0.82–1.77)

## 2018-05-03 ENCOUNTER — Other Ambulatory Visit: Payer: Self-pay | Admitting: Family Medicine

## 2018-05-03 ENCOUNTER — Telehealth: Payer: Self-pay

## 2018-05-03 DIAGNOSIS — R634 Abnormal weight loss: Secondary | ICD-10-CM

## 2018-05-03 NOTE — Telephone Encounter (Signed)
Patient advised as below and verbally voiced understanding. Patient states he still wants to know about the status of his Hydrocodone. He says he needs Dr. Caryn Section to send a letter to his new insurance [employer], to lower the tier of Hydrocodone to a tier 1 so the price is cheaper. He states he gave Alyson Ingles some document with  information on how to contact [facility]. Patient states that Dr. Caryn Section knows all about this and this was discussed during his last office visit.

## 2018-05-03 NOTE — Telephone Encounter (Signed)
Notes recorded by Birdie Sons, MD on 05/03/2018 at 12:41 PM EST No need to be seen tomorrow since labs are normal and referrals are underway. ------  Notes recorded by Birdie Sons, MD on 05/03/2018 at 12:39 PM EST Please advise labs are all normal, no abnormalities that would explain his weight loss. Need to proceed with referral to GI and abdominal ultrasound. Orders have been placed.

## 2018-05-03 NOTE — Telephone Encounter (Signed)
This encounter was created in error - please disregard.

## 2018-05-03 NOTE — Telephone Encounter (Signed)
-----   Message from Birdie Sons, MD sent at 05/03/2018 12:41 PM EST ----- No need to be seen tomorrow since labs are normal and referrals are underway.

## 2018-05-03 NOTE — Telephone Encounter (Signed)
Patient was in the office today to speak with Mckenzie. He is requesting the results from his recent labs. Please review results.

## 2018-05-04 ENCOUNTER — Ambulatory Visit: Payer: Medicare Other | Admitting: Family Medicine

## 2018-05-04 NOTE — Telephone Encounter (Signed)
Please advise that we have to get contact information for an appeal with the new insurance company from his pharmacy.  The pharmacy cannot get [place] that information until the first prescription is denied.  The pharmacy won't process the prescription yet because it is too early.  We will initiate the process with his pharmacy 2 weeks before the next refill is due, so it will all be taken care of well before January 23rd.  He needs to make sure the pharmacy has his new insurance information on file BEFORE we initiate prior authorization.

## 2018-05-04 NOTE — Telephone Encounter (Signed)
Patient advised as below. He states that he still doesn't understand why this process cant be started right now, as it has been done in the past. Patient has a couple more questions.  1) He wants to know why Dr. Caryn Section told him the other week while he was in the office, that it was approved and everything was squared away?  2) He also wants to know exactly who will be initiating the process with the pharmacy? I advised him that our office would do that.

## 2018-05-07 ENCOUNTER — Other Ambulatory Visit: Payer: Self-pay | Admitting: Student

## 2018-05-07 DIAGNOSIS — R194 Change in bowel habit: Secondary | ICD-10-CM | POA: Diagnosis not present

## 2018-05-07 DIAGNOSIS — R634 Abnormal weight loss: Secondary | ICD-10-CM

## 2018-05-08 ENCOUNTER — Ambulatory Visit: Payer: Medicare Other

## 2018-05-08 ENCOUNTER — Ambulatory Visit: Payer: Medicare Other | Admitting: Family Medicine

## 2018-05-08 DIAGNOSIS — M5416 Radiculopathy, lumbar region: Secondary | ICD-10-CM | POA: Diagnosis not present

## 2018-05-08 DIAGNOSIS — M545 Low back pain: Secondary | ICD-10-CM | POA: Diagnosis not present

## 2018-05-08 NOTE — Progress Notes (Signed)
error 

## 2018-05-09 ENCOUNTER — Encounter: Payer: Self-pay | Admitting: Urology

## 2018-05-09 ENCOUNTER — Ambulatory Visit (INDEPENDENT_AMBULATORY_CARE_PROVIDER_SITE_OTHER): Payer: Medicare Other | Admitting: Urology

## 2018-05-09 VITALS — Ht 70.0 in | Wt 139.0 lb

## 2018-05-09 DIAGNOSIS — N401 Enlarged prostate with lower urinary tract symptoms: Secondary | ICD-10-CM | POA: Diagnosis not present

## 2018-05-09 DIAGNOSIS — E291 Testicular hypofunction: Secondary | ICD-10-CM

## 2018-05-09 DIAGNOSIS — N138 Other obstructive and reflux uropathy: Secondary | ICD-10-CM | POA: Diagnosis not present

## 2018-05-09 DIAGNOSIS — N529 Male erectile dysfunction, unspecified: Secondary | ICD-10-CM

## 2018-05-09 DIAGNOSIS — E349 Endocrine disorder, unspecified: Secondary | ICD-10-CM | POA: Diagnosis not present

## 2018-05-09 MED ORDER — TESTOSTERONE CYPIONATE 200 MG/ML IM SOLN
200.0000 mg | Freq: Once | INTRAMUSCULAR | Status: AC
  Administered 2018-05-09: 200 mg via INTRAMUSCULAR

## 2018-05-09 NOTE — Progress Notes (Signed)
IM Injection  Patient is present today for an IM Injection for treatment of Hypogonadism.  Drug: Testosterone Cypionate Dose:52ml Location:right upper outer per patient request Lot: [id number] Exp:06/2019 Patient tolerated well, no complications were noted  Preformed by: Elizabeth Palau, CMA(AAMA)  Additional notes/ Follow up: labs and talk with Zara Council.

## 2018-05-10 ENCOUNTER — Ambulatory Visit
Admission: RE | Admit: 2018-05-10 | Discharge: 2018-05-10 | Disposition: A | Discharge status: Home / Self Care | Payer: Medicare Other | Source: Ambulatory Visit | Attending: Student | Admitting: Student

## 2018-05-10 DIAGNOSIS — R634 Abnormal weight loss: Secondary | ICD-10-CM | POA: Insufficient documentation

## 2018-05-10 DIAGNOSIS — K5730 Diverticulosis of large intestine without perforation or abscess without bleeding: Secondary | ICD-10-CM | POA: Diagnosis not present

## 2018-05-10 MED ORDER — IOPAMIDOL (ISOVUE-300) INJECTION 61%
80.0000 mL | Freq: Once | INTRAVENOUS | Status: AC | PRN
  Administered 2018-05-10: 80 mL via INTRAVENOUS

## 2018-05-11 ENCOUNTER — Other Ambulatory Visit: Payer: Self-pay | Admitting: Family Medicine

## 2018-05-11 DIAGNOSIS — M545 Low back pain: Secondary | ICD-10-CM | POA: Diagnosis not present

## 2018-05-11 DIAGNOSIS — M5137 Other intervertebral disc degeneration, lumbosacral region: Secondary | ICD-10-CM

## 2018-05-11 DIAGNOSIS — M5416 Radiculopathy, lumbar region: Secondary | ICD-10-CM | POA: Diagnosis not present

## 2018-05-11 MED ORDER — HYDROCODONE-ACETAMINOPHEN 7.5-325 MG PO TABS: ORAL_TABLET | ORAL | 0 refills | Status: DC

## 2018-05-14 ENCOUNTER — Telehealth: Payer: Self-pay | Admitting: Family Medicine

## 2018-05-14 NOTE — Telephone Encounter (Signed)
Did not call pt.   Thanks,   -Mickel Baas

## 2018-05-14 NOTE — Telephone Encounter (Signed)
Pt returned missed call.  Please call pt back. ° °Thanks, °[facility] °

## 2018-05-16 ENCOUNTER — Telehealth: Payer: Self-pay | Admitting: Family Medicine

## 2018-05-16 DIAGNOSIS — M5416 Radiculopathy, lumbar region: Secondary | ICD-10-CM | POA: Diagnosis not present

## 2018-05-16 DIAGNOSIS — M545 Low back pain: Secondary | ICD-10-CM | POA: Diagnosis not present

## 2018-05-16 NOTE — Telephone Encounter (Signed)
Pt wanted to let Dr Caryn Section know:  Do not send prior aurth for 2020 - HYDROcodone-acetaminophen ([place]) 7.5-325 MG tablet. [employer] advises they will use records from last year for HYDROcodone-acetaminophen ([place]) 7.5-325 MG tablet.  They are going to classify him as tier 1 classification.  Thanks, [employer]

## 2018-05-18 DIAGNOSIS — M5416 Radiculopathy, lumbar region: Secondary | ICD-10-CM | POA: Diagnosis not present

## 2018-05-18 DIAGNOSIS — M545 Low back pain: Secondary | ICD-10-CM | POA: Diagnosis not present

## 2018-05-20 ENCOUNTER — Other Ambulatory Visit: Payer: Self-pay | Admitting: Family Medicine

## 2018-05-21 ENCOUNTER — Other Ambulatory Visit: Payer: Self-pay

## 2018-05-21 ENCOUNTER — Telehealth: Payer: Self-pay | Admitting: Urology

## 2018-05-21 DIAGNOSIS — R634 Abnormal weight loss: Secondary | ICD-10-CM | POA: Diagnosis not present

## 2018-05-21 DIAGNOSIS — M5416 Radiculopathy, lumbar region: Secondary | ICD-10-CM | POA: Diagnosis not present

## 2018-05-21 DIAGNOSIS — E291 Testicular hypofunction: Secondary | ICD-10-CM

## 2018-05-21 DIAGNOSIS — M545 Low back pain: Secondary | ICD-10-CM | POA: Diagnosis not present

## 2018-05-21 NOTE — Progress Notes (Signed)
es

## 2018-05-21 NOTE — Telephone Encounter (Signed)
He has an appointment with me on 05/30/2018.  I will refill the testosterone at that time, since his next injection is not due until February.

## 2018-05-21 NOTE — Telephone Encounter (Signed)
Left message informing patient that his testosterone will get refilled at his appointment with Zara Council.

## 2018-05-21 NOTE — Telephone Encounter (Signed)
Pt would like a testosterone refill sent in to [facility] in [place].  Pt is out of meds and his next injection is scheduled for early February.

## 2018-05-23 DIAGNOSIS — L578 Other skin changes due to chronic exposure to nonionizing radiation: Secondary | ICD-10-CM | POA: Diagnosis not present

## 2018-05-23 DIAGNOSIS — D225 Melanocytic nevi of trunk: Secondary | ICD-10-CM | POA: Diagnosis not present

## 2018-05-23 DIAGNOSIS — L821 Other seborrheic keratosis: Secondary | ICD-10-CM | POA: Diagnosis not present

## 2018-05-23 DIAGNOSIS — D692 Other nonthrombocytopenic purpura: Secondary | ICD-10-CM | POA: Diagnosis not present

## 2018-05-23 DIAGNOSIS — D229 Melanocytic nevi, unspecified: Secondary | ICD-10-CM | POA: Diagnosis not present

## 2018-05-23 DIAGNOSIS — L820 Inflamed seborrheic keratosis: Secondary | ICD-10-CM | POA: Diagnosis not present

## 2018-05-23 DIAGNOSIS — D1800 Hemangioma unspecified site: Secondary | ICD-10-CM | POA: Diagnosis not present

## 2018-05-24 DIAGNOSIS — M5416 Radiculopathy, lumbar region: Secondary | ICD-10-CM | POA: Diagnosis not present

## 2018-05-24 DIAGNOSIS — M545 Low back pain: Secondary | ICD-10-CM | POA: Diagnosis not present

## 2018-05-25 ENCOUNTER — Other Ambulatory Visit: Payer: Medicare Other

## 2018-05-25 DIAGNOSIS — E291 Testicular hypofunction: Secondary | ICD-10-CM | POA: Diagnosis not present

## 2018-05-25 DIAGNOSIS — N401 Enlarged prostate with lower urinary tract symptoms: Secondary | ICD-10-CM | POA: Diagnosis not present

## 2018-05-25 DIAGNOSIS — N138 Other obstructive and reflux uropathy: Secondary | ICD-10-CM | POA: Diagnosis not present

## 2018-05-26 LAB — TESTOSTERONE: TESTOSTERONE: 357 ng/dL (ref 264–916)

## 2018-05-28 ENCOUNTER — Telehealth: Payer: Self-pay

## 2018-05-28 DIAGNOSIS — L820 Inflamed seborrheic keratosis: Secondary | ICD-10-CM | POA: Diagnosis not present

## 2018-05-28 NOTE — Telephone Encounter (Signed)
-----   Message from Nori Riis, PA-C sent at 05/28/2018  8:11 AM EST ----- Please add a PSA to his blood work.

## 2018-05-28 NOTE — Telephone Encounter (Signed)
Spoke with Altamese Dilling at [facility] and added on a PSA will fax for signature

## 2018-05-29 DIAGNOSIS — M545 Low back pain: Secondary | ICD-10-CM | POA: Diagnosis not present

## 2018-05-29 DIAGNOSIS — M5416 Radiculopathy, lumbar region: Secondary | ICD-10-CM | POA: Diagnosis not present

## 2018-05-30 ENCOUNTER — Encounter: Payer: Self-pay | Admitting: Urology

## 2018-05-30 ENCOUNTER — Ambulatory Visit (INDEPENDENT_AMBULATORY_CARE_PROVIDER_SITE_OTHER): Payer: Medicare Other | Admitting: Urology

## 2018-05-30 VITALS — BP 122/71 | HR 80 | Ht 70.0 in | Wt 141.0 lb

## 2018-05-30 DIAGNOSIS — N401 Enlarged prostate with lower urinary tract symptoms: Secondary | ICD-10-CM | POA: Diagnosis not present

## 2018-05-30 DIAGNOSIS — E291 Testicular hypofunction: Secondary | ICD-10-CM | POA: Diagnosis not present

## 2018-05-30 DIAGNOSIS — N138 Other obstructive and reflux uropathy: Secondary | ICD-10-CM | POA: Diagnosis not present

## 2018-05-30 LAB — PSA: Prostate Specific Ag, Serum: 0.4 ng/mL (ref 0.0–4.0)

## 2018-05-30 LAB — BLADDER SCAN AMB NON-IMAGING

## 2018-05-30 LAB — SPECIMEN STATUS REPORT

## 2018-05-30 MED ORDER — TESTOSTERONE CYPIONATE 200 MG/ML IM SOLN: 200.0000 mg | INTRAMUSCULAR | 1 refills | Status: DC

## 2018-05-30 NOTE — Progress Notes (Signed)
05/30/2018  11:33 AM   [name] 25-Jan-1932 [id number]  Referring provider: Birdie Sons, [address], [place] [address]  Chief Complaint  Patient presents with  . Benign Prostatic Hypertrophy   HPI: ABDULAI BLAYLOCK is a 85 y.o. White or Caucasian male with a past urological history of BPH (benign prostatic hyperplasia) with LUTS, Hypogonadism, Impotence, and Nocturia. He presents today per his request for an evaluation of the above.  Patient reports that he is experiencing extreme weight loss (lost 20 lbs in <1 year). Appetite remains the same. Currently being worked up by GI.  Hypogonadism He is currently on testosterone cypionate 1 mL x 3 weeks. His most recent testosterone was 357 on 05/25/2018. Hbg 12.8 and HCT 37.6 on 04/27/2018.  BPH with LUTS His I-PSS is 4, it is stable with mild LUTS. The patient is pleased (slightly improved outlook) and admits he has no problems with his symptoms at this time.   His last PSA was 0.4 on 05/25/2018. His PVR is 45 mL.  IPSS    Row Name 05/30/18 1100         International Prostate Symptom Score   How often have you had the sensation of not emptying your bladder?  Not at All     How often have you had to urinate less than every two hours?  Less than 1 in 5 times     How often have you found you stopped and started again several times when you urinated?  Not at All     How often have you found it difficult to postpone urination?  Not at All     How often have you had a weak urinary stream?  Less than 1 in 5 times     How often have you had to strain to start urination?  Not at All     How many times did you typically get up at night to urinate?  2 Times     Total IPSS Score  4       Quality of Life due to urinary symptoms   If you were to spend the rest of your life with your urinary condition just the way it is now how would you feel about that?  Pleased       PMH: Past Medical History:  Diagnosis Date   . BPH (benign prostatic hyperplasia)   . CAD (coronary artery disease)   . CAD (coronary artery disease)   . Carpal tunnel syndrome   . DDD (degenerative disc disease), cervical   . Environmental and seasonal allergies   . GERD (gastroesophageal reflux disease)   . Glaucoma   . HLD (hyperlipidemia)   . Hypertension   . Hypogonadism in male   . Impotence   . Nocturia   . Weight loss    Surgical History: Past Surgical History:  Procedure Laterality Date  . APPENDECTOMY    . BILATERAL CARPAL TUNNEL RELEASE     05/12/2011, 06/09/2011  . CARDIAC CATHETERIZATION  2004  . coils in vessel     6 coils  . CORONARY ANGIOPLASTY     x 5  . CORONARY ARTERY BYPASS GRAFT  2007   5; [facility]  . CORONARY STENT PLACEMENT  2005  . ESOPHAGOGASTRODUODENOSCOPY (EGD) WITH PROPOFOL N/A 10/28/2015   Procedure: ESOPHAGOGASTRODUODENOSCOPY (EGD) WITH PROPOFOL;  Surgeon: Manya Silvas, MD;  Location: [facility] ENDOSCOPY;  Service: Endoscopy;  Laterality: N/A;  . KIDNEY STONE SURGERY    .  PACEMAKER INSERTION N/A 07/13/2017   Procedure: INSERTION PACEMAKER;  Surgeon: Isaias Cowman, MD;  Location: [facility] ORS;  Service: Cardiovascular;  Laterality: N/A;  . SHOULDER ARTHROSCOPY W/ ROTATOR CUFF REPAIR Bilateral   . TONSILLECTOMY    . UPPER GI ENDOSCOPY  08/02/2010   Dr. Tedra Coupe, gastritis. H Pylori negative   Home Medications:  Allergies as of 05/30/2018      Reactions   Celebrex [celecoxib]    Noxius taste in mouth   Mirtazapine    Bad taste in mouth. Refuses to take again      Medication List       Accurate as of May 30, 2018 11:33 AM. Always use your most recent med list.        atorvastatin 80 MG tablet Commonly known as:  LIPITOR TAKE 1 TABLET DAILY   clopidogrel 75 MG tablet Commonly known as:  PLAVIX TAKE 1 TABLET BY MOUTH DAILY   enalapril 5 MG tablet Commonly known as:  VASOTEC TAKE ONE TABLET BY MOUTH DAILY   fluticasone 50 MCG/ACT nasal spray Commonly known as:   FLONASE PLACE 2 SPRAYS INTO BOTH NOSTRILS DAILY.   furosemide 20 MG tablet Commonly known as:  LASIX Take 0.5 tablets (10 mg total) by mouth daily as needed for edema.   GAVILYTE-N WITH FLAVOR PACK 420 g solution Generic drug:  polyethylene glycol-electrolytes GaviLyte-N 420 gram oral solution   guaiFENesin-codeine 100-10 MG/5ML syrup Commonly known as:  ROBITUSSIN AC TAKE 1-2 TEASPOONS EVERY 6 HOURS AS NEEDED FOR COUGH   HYDROcodone-acetaminophen 7.5-325 MG tablet Commonly known as:  NORCO 1 tablet every 4-6 hours as needed   hydrocortisone 2.5 % cream Apply topically 2 (two) times daily as needed.   latanoprost 0.005 % ophthalmic solution Commonly known as:  XALATAN Place 1 drop into both eyes at bedtime.   metoprolol succinate 25 MG 24 hr tablet Commonly known as:  TOPROL-XL Take 1 tablet (25 mg total) by mouth daily.   mupirocin ointment 2 % Commonly known as:  BACTROBAN   nitroGLYCERIN 0.4 MG SL tablet Commonly known as:  NITROSTAT Place 1 tablet (0.4 mg total) under the tongue every 5 (five) minutes as needed for chest pain.   ranitidine 300 MG tablet Commonly known as:  ZANTAC TAKE 1 TABLET TWICE DAILY   testosterone cypionate 200 MG/ML injection Commonly known as:  DEPOTESTOSTERONE CYPIONATE Inject 1 mL (200 mg total) into the muscle every 28 (twenty-eight) days.   zolpidem 10 MG tablet Commonly known as:  AMBIEN TAKE 1/2 TO 1 TABLET EVERY NIGHT AT BEDTIME      Allergies:  Allergies  Allergen Reactions  . Celebrex [Celecoxib]     Noxius taste in mouth  . Mirtazapine     Bad taste in mouth. Refuses to take again   Family History: Family History  Problem Relation Age of Onset  . Cirrhosis Father        of liver  . Kidney disease Neg Hx   . Prostate cancer Neg Hx   . Kidney cancer Neg Hx   . Bladder Cancer Neg Hx    Social History:  reports that he quit smoking about 41 years ago. He has a 10.00 pack-year smoking history. He has never used  smokeless tobacco. He reports current alcohol use of about 14.0 standard drinks of alcohol per week. He reports that he does not use drugs.  ROS: UROLOGY Frequent Urination?: No Hard to postpone urination?: No Burning/pain with urination?: No Get up at night to  urinate?: No Leakage of urine?: No Urine stream starts and stops?: No Trouble starting stream?: No Do you have to strain to urinate?: No Blood in urine?: No Urinary tract infection?: No Sexually transmitted disease?: No Injury to kidneys or bladder?: No Painful intercourse?: No Weak stream?: No Erection problems?: No Penile pain?: No  Gastrointestinal Nausea?: No Vomiting?: No Indigestion/heartburn?: No Diarrhea?: No Constipation?: No  Constitutional Fever: No Night sweats?: No Weight loss?: No Fatigue?: No  Skin Skin rash/lesions?: No Itching?: No  Eyes Blurred vision?: No Double vision?: Yes(corrective lenses)  Ears/Nose/Throat Sore throat?: No Sinus problems?: No  Hematologic/Lymphatic Swollen glands?: No Easy bruising?: No  Cardiovascular Leg swelling?: No Chest pain?: No  Respiratory Cough?: No Shortness of breath?: No  Endocrine Excessive thirst?: No  Musculoskeletal Back pain?: No Joint pain?: No  Neurological Headaches?: No Dizziness?: No  Psychologic Depression?: No Anxiety?: No  Physical Exam: BP 122/71 (BP Location: Left Arm, Patient Position: Sitting)   Pulse 80   Ht 5\' 10"  (1.778 m)   Wt 141 lb (64 kg)   BMI 20.23 kg/m   Constitutional:  Alert and oriented, No acute distress. Respiratory: Normal respiratory effort, no increased work of breathing. Head: Normocephalic and Atraumatic. GU: No CVA tenderness.  No bladder fullness or masses.  Patient with circumcised. Urethral meatus is patent.  No penile discharge. No penile lesions or rashes. Scrotum without lesions, cysts, rashes and/or edema.  Testicles are located scrotally bilaterally. No masses are appreciated in  the testicles. Left and right epididymis are normal. Rectal: Patient with  normal sphincter tone. Anus and perineum without scarring or rashes. No rectal masses are appreciated. Prostate is approximately 60 grams, no nodules are appreciated. Seminal vesicles were unappreciated Skin: No rashes, bruises or suspicious lesions. Neurologic: Grossly intact, no focal deficits, moving all 4 extremities. Psychiatric: Normal mood and affect.  Laboratory Data: Lab Results  Component Value Date   WBC 4.8 04/27/2018   HGB 12.8 (L) 04/27/2018   HCT 37.6 04/27/2018   MCV 98 (H) 04/27/2018   PLT 161 04/27/2018   Lab Results  Component Value Date   CREATININE 1.39 (H) 04/27/2018   Lab Results  Component Value Date   TESTOSTERONE 357 05/25/2018   Pertinent Imaging: Results for orders placed or performed in visit on 05/30/18  BLADDER SCAN AMB NON-IMAGING  Result Value Ref Range   Scan Result 10ml    Assessment & Plan:   1.  Hypogonadism in male             - Last testosterone was 357 05/25/2018, which is within our goal range. Provided with paper refill script.   - Return in 6 months with labs prior  2. BPH with LUTS             - IPSS score is 4/1. His symptoms are stable and his outlook has improbed             - Continue conservative management, avoiding bladder irritants and timed voidings             - RTC in 6 months for IPSS, PSA and exam, as testosterone therapy can cause prostate enlargement and worsen LUTS  Return in about 6 months (around 11/28/2018) for IPSS, PSA, PVR and exam with labs prior.  Zara Council, PA-C [facility] [address] [place], [place] [address] [phone]  I, Temidayo Atanda-Ogunleye , am acting as a scribe for [facility], PA-C  I have reviewed the  above documentation for accuracy and completeness, and I agree with the above.    Zara Council, PA-C

## 2018-05-31 DIAGNOSIS — M545 Low back pain: Secondary | ICD-10-CM | POA: Diagnosis not present

## 2018-05-31 DIAGNOSIS — M5416 Radiculopathy, lumbar region: Secondary | ICD-10-CM | POA: Diagnosis not present

## 2018-06-04 ENCOUNTER — Other Ambulatory Visit: Payer: Self-pay | Admitting: Family Medicine

## 2018-06-04 DIAGNOSIS — J3489 Other specified disorders of nose and nasal sinuses: Secondary | ICD-10-CM

## 2018-06-05 DIAGNOSIS — M545 Low back pain: Secondary | ICD-10-CM | POA: Diagnosis not present

## 2018-06-05 DIAGNOSIS — M5416 Radiculopathy, lumbar region: Secondary | ICD-10-CM | POA: Diagnosis not present

## 2018-06-06 ENCOUNTER — Other Ambulatory Visit: Payer: Self-pay | Admitting: Family Medicine

## 2018-06-06 ENCOUNTER — Telehealth: Payer: Self-pay | Admitting: Family Medicine

## 2018-06-06 DIAGNOSIS — M5137 Other intervertebral disc degeneration, lumbosacral region: Secondary | ICD-10-CM

## 2018-06-06 MED ORDER — HYDROCODONE-ACETAMINOPHEN 7.5-325 MG PO TABS: ORAL_TABLET | ORAL | 0 refills | Status: DC

## 2018-06-06 NOTE — Telephone Encounter (Signed)
Moro over a  Tier 3 acception for:  HYDROcodone-acetaminophen ([place]) 7.5-325 MG tablet   Thanks, [place]

## 2018-06-06 NOTE — Telephone Encounter (Signed)
Prescriptions printed, he can cancel fridays appointment since he was just seen.

## 2018-06-06 NOTE — Telephone Encounter (Signed)
Pt needing a call back to let him know if he needs the appt on Fri 7th since he was seen in Jan?  Pt also wanting to request his hard copies to pick up of the HYDROcodone-acetaminophen ([place]) 7.5-325 MG tablet   Please advise pt.  Thanks, [employer]

## 2018-06-07 NOTE — Telephone Encounter (Signed)
Pt would like to keep his appointment he wants to go over a few things with you.   Thanks,   -Mickel Baas

## 2018-06-08 ENCOUNTER — Encounter: Payer: Self-pay | Admitting: Family Medicine

## 2018-06-08 ENCOUNTER — Ambulatory Visit (INDEPENDENT_AMBULATORY_CARE_PROVIDER_SITE_OTHER): Payer: Medicare Other | Admitting: Family Medicine

## 2018-06-08 VITALS — BP 132/60 | HR 84 | Temp 97.7°F | Resp 16 | Wt 140.0 lb

## 2018-06-08 DIAGNOSIS — R634 Abnormal weight loss: Secondary | ICD-10-CM | POA: Diagnosis not present

## 2018-06-08 DIAGNOSIS — M545 Low back pain: Secondary | ICD-10-CM | POA: Diagnosis not present

## 2018-06-08 DIAGNOSIS — M5416 Radiculopathy, lumbar region: Secondary | ICD-10-CM | POA: Diagnosis not present

## 2018-06-08 NOTE — Progress Notes (Signed)
Patient: Ivan Salazar Male    DOB: 1931/08/17   85 y.o.   MRN: [id number] Visit Date: 06/08/2018  Today's Provider: Lelon Huh, MD   Chief Complaint  Patient presents with  . Follow-up   Subjective:     HPI Weight Loss: Patient comes in today wanting to discuss several concerns. He is scheduled to have an Endoscopy procedure on 07/23/2018. He wants to know if anything can be done to help him gain weight. He has other questions and concerns about his health.    Wt Readings from Last 10 Encounters:  06/08/18 140 lb (63.5 kg)  05/30/18 141 lb (64 kg)  05/09/18 139 lb (63 kg)  04/27/18 139 lb (63 kg)  04/18/18 143 lb 6.4 oz (65 kg)  03/08/18 149 lb (67.6 kg)  02/06/18 144 lb (65.3 kg)  11/27/17 151 lb 3.2 oz (68.6 kg)  11/14/17 149 lb (67.6 kg)  11/03/17 149 lb (67.6 kg)     Wound Check: Patient states he had some skin removed by the Dermatologist 1 week ago. He says the location where they removed his skin now has a scab over it. Patient would like to have the wound evaluated today.   Allergies  Allergen Reactions  . Celebrex [Celecoxib]     Noxius taste in mouth  . Mirtazapine     Bad taste in mouth. Refuses to take again     Current Outpatient Medications:  .  atorvastatin (LIPITOR) 80 MG tablet, TAKE 1 TABLET DAILY, Disp: 90 tablet, Rfl: 4 .  clopidogrel (PLAVIX) 75 MG tablet, TAKE 1 TABLET BY MOUTH DAILY, Disp: 90 tablet, Rfl: 4 .  enalapril (VASOTEC) 5 MG tablet, TAKE ONE TABLET BY MOUTH DAILY, Disp: 90 tablet, Rfl: 4 .  fluticasone (FLONASE) 50 MCG/ACT nasal spray, INSTILL TWO SPRAYS IN EACH NOSTRIL ONCE DAILY, Disp: 16 g, Rfl: 4 .  furosemide (LASIX) 20 MG tablet, Take 0.5 tablets (10 mg total) by mouth daily as needed for edema., Disp: 1 tablet, Rfl: 0 .  guaiFENesin-codeine (ROBITUSSIN AC) 100-10 MG/5ML syrup, TAKE 1-2 TEASPOONS EVERY 6 HOURS AS NEEDED FOR COUGH, Disp: 180 mL, Rfl: 5 .  HYDROcodone-acetaminophen (NORCO) 7.5-325 MG tablet, 1 tablet  every 4-6 hours as needed, Disp: 180 tablet, Rfl: 0 .  hydrocortisone 2.5 % cream, Apply topically 2 (two) times daily as needed., Disp: 30 g, Rfl: 0 .  latanoprost (XALATAN) 0.005 % ophthalmic solution, Place 1 drop into both eyes at bedtime. , Disp: , Rfl:  .  metoprolol succinate (TOPROL-XL) 25 MG 24 hr tablet, Take 1 tablet (25 mg total) by mouth daily., Disp: 90 tablet, Rfl: 4 .  mupirocin ointment (BACTROBAN) 2 %, , Disp: , Rfl:  .  nitroGLYCERIN (NITROSTAT) 0.4 MG SL tablet, Place 1 tablet (0.4 mg total) under the tongue every 5 (five) minutes as needed for chest pain., Disp: 20 tablet, Rfl: 1 .  polyethylene glycol-electrolytes (GAVILYTE-N WITH FLAVOR PACK) 420 g solution, GaviLyte-N 420 gram oral solution, Disp: , Rfl:  .  ranitidine (ZANTAC) 300 MG tablet, TAKE 1 TABLET TWICE DAILY, Disp: 180 tablet, Rfl: 4 .  testosterone cypionate (DEPOTESTOSTERONE CYPIONATE) 200 MG/ML injection, Inject 1 mL (200 mg total) into the muscle every 28 (twenty-eight) days., Disp: 10 mL, Rfl: 1 .  zolpidem (AMBIEN) 10 MG tablet, TAKE 1/2 TO 1 TABLET EVERY NIGHT AT BEDTIME, Disp: 30 tablet, Rfl: 3  Review of Systems  Constitutional: Negative for appetite change, chills and fever.  Respiratory:  Negative for chest tightness, shortness of breath and wheezing.   Cardiovascular: Negative for chest pain and palpitations.  Gastrointestinal: Negative for abdominal pain, nausea and vomiting.  Skin: Positive for wound (left side of chest).    Social History   Tobacco Use  . Smoking status: Former Smoker    Packs/day: 0.50    Years: 20.00    Pack years: 10.00    Last attempt to quit: 05/01/1977    Years since quitting: 41.1  . Smokeless tobacco: Never Used  Substance Use Topics  . Alcohol use: Yes    Alcohol/week: 14.0 standard drinks    Types: 14 Shots of liquor per week    Comment: moderate; 2 drinks daily      Objective:   BP 132/60 (BP Location: Left Arm, Patient Position: Sitting, Cuff Size:  Normal)   Pulse 84   Temp 97.7 F (36.5 C) (Oral)   Resp 16   Wt 140 lb (63.5 kg)   SpO2 99% Comment: room air  BMI 20.09 kg/m  Vitals:   06/08/18 1528  BP: 132/60  Pulse: 84  Resp: 16  Temp: 97.7 F (36.5 C)  TempSrc: Oral  SpO2: 99%  Weight: 140 lb (63.5 kg)     Physical Exam  General appearance: alert, well developed, well nourished, cooperative and in no distress Head: Normocephalic, without obvious abnormality, atraumatic Respiratory: Respirations even and unlabored, normal respiratory rate Extremities: No gross deformities Skin: Well healed skin lesion s/p cryotherapy     Assessment & Plan    1. Loss of weight He is scheduled for EGD by Dr. [name] next months. Although he has lost weight from his baseline, he is not underweight and there is no indication for medication to stimulate his appetite.      Lelon Huh, MD  [facility]

## 2018-06-08 NOTE — Telephone Encounter (Signed)
Completed, sent to be faxed

## 2018-06-08 NOTE — Telephone Encounter (Signed)
[employer] forms faxed. Thanks TNP

## 2018-06-12 ENCOUNTER — Ambulatory Visit (INDEPENDENT_AMBULATORY_CARE_PROVIDER_SITE_OTHER): Payer: Medicare Other

## 2018-06-12 DIAGNOSIS — E291 Testicular hypofunction: Secondary | ICD-10-CM | POA: Diagnosis not present

## 2018-06-12 DIAGNOSIS — M545 Low back pain: Secondary | ICD-10-CM | POA: Diagnosis not present

## 2018-06-12 DIAGNOSIS — M5416 Radiculopathy, lumbar region: Secondary | ICD-10-CM | POA: Diagnosis not present

## 2018-06-12 MED ORDER — TESTOSTERONE CYPIONATE 200 MG/ML IM SOLN
200.0000 mg | Freq: Once | INTRAMUSCULAR | Status: AC
  Administered 2018-06-12: 200 mg via INTRAMUSCULAR

## 2018-06-12 NOTE — Patient Instructions (Signed)
.   Please review the attached list of medications and notify my office if there are any errors.   . Please bring all of your medications to every appointment so we can make sure that our medication list is the same as yours.   

## 2018-06-12 NOTE — Progress Notes (Signed)
Testosterone IM Injection  Due to Hypogonadism patient is present today for a Testosterone Injection.  Medication: Testosterone Cypionate Dose: 72mL Location: right upper outer buttocks Lot: [id number] Exp:12/2019  Patient tolerated well, no complications were noted  Preformed by: [place] ([facility])  Follow up: RTC as scheduled

## 2018-06-13 ENCOUNTER — Ambulatory Visit: Payer: Medicare Other

## 2018-06-14 ENCOUNTER — Ambulatory Visit: Payer: Medicare Other

## 2018-06-15 DIAGNOSIS — M5416 Radiculopathy, lumbar region: Secondary | ICD-10-CM | POA: Diagnosis not present

## 2018-06-15 DIAGNOSIS — M545 Low back pain: Secondary | ICD-10-CM | POA: Diagnosis not present

## 2018-06-19 DIAGNOSIS — M545 Low back pain: Secondary | ICD-10-CM | POA: Diagnosis not present

## 2018-06-19 DIAGNOSIS — M5416 Radiculopathy, lumbar region: Secondary | ICD-10-CM | POA: Diagnosis not present

## 2018-06-21 DIAGNOSIS — M5416 Radiculopathy, lumbar region: Secondary | ICD-10-CM | POA: Diagnosis not present

## 2018-06-21 DIAGNOSIS — M545 Low back pain: Secondary | ICD-10-CM | POA: Diagnosis not present

## 2018-06-26 DIAGNOSIS — I2510 Atherosclerotic heart disease of native coronary artery without angina pectoris: Secondary | ICD-10-CM | POA: Diagnosis not present

## 2018-06-26 DIAGNOSIS — Z950 Presence of cardiac pacemaker: Secondary | ICD-10-CM | POA: Diagnosis not present

## 2018-06-26 DIAGNOSIS — R609 Edema, unspecified: Secondary | ICD-10-CM | POA: Diagnosis not present

## 2018-06-26 DIAGNOSIS — I10 Essential (primary) hypertension: Secondary | ICD-10-CM | POA: Diagnosis not present

## 2018-06-26 DIAGNOSIS — R0602 Shortness of breath: Secondary | ICD-10-CM | POA: Diagnosis not present

## 2018-06-26 DIAGNOSIS — Z951 Presence of aortocoronary bypass graft: Secondary | ICD-10-CM | POA: Diagnosis not present

## 2018-06-26 DIAGNOSIS — I495 Sick sinus syndrome: Secondary | ICD-10-CM | POA: Diagnosis not present

## 2018-06-27 DIAGNOSIS — M545 Low back pain: Secondary | ICD-10-CM | POA: Diagnosis not present

## 2018-06-27 DIAGNOSIS — M5416 Radiculopathy, lumbar region: Secondary | ICD-10-CM | POA: Diagnosis not present

## 2018-06-27 DIAGNOSIS — H401131 Primary open-angle glaucoma, bilateral, mild stage: Secondary | ICD-10-CM | POA: Diagnosis not present

## 2018-07-02 DIAGNOSIS — M545 Low back pain: Secondary | ICD-10-CM | POA: Diagnosis not present

## 2018-07-02 DIAGNOSIS — M5416 Radiculopathy, lumbar region: Secondary | ICD-10-CM | POA: Diagnosis not present

## 2018-07-09 DIAGNOSIS — M5416 Radiculopathy, lumbar region: Secondary | ICD-10-CM | POA: Diagnosis not present

## 2018-07-09 DIAGNOSIS — M545 Low back pain: Secondary | ICD-10-CM | POA: Diagnosis not present

## 2018-07-10 DIAGNOSIS — R001 Bradycardia, unspecified: Secondary | ICD-10-CM | POA: Diagnosis not present

## 2018-07-10 DIAGNOSIS — F4323 Adjustment disorder with mixed anxiety and depressed mood: Secondary | ICD-10-CM | POA: Diagnosis not present

## 2018-07-12 DIAGNOSIS — M545 Low back pain: Secondary | ICD-10-CM | POA: Diagnosis not present

## 2018-07-12 DIAGNOSIS — M5416 Radiculopathy, lumbar region: Secondary | ICD-10-CM | POA: Diagnosis not present

## 2018-07-16 DIAGNOSIS — M5416 Radiculopathy, lumbar region: Secondary | ICD-10-CM | POA: Diagnosis not present

## 2018-07-16 DIAGNOSIS — M545 Low back pain: Secondary | ICD-10-CM | POA: Diagnosis not present

## 2018-07-18 ENCOUNTER — Ambulatory Visit (INDEPENDENT_AMBULATORY_CARE_PROVIDER_SITE_OTHER): Payer: Medicare Other

## 2018-07-18 ENCOUNTER — Other Ambulatory Visit: Payer: Self-pay

## 2018-07-18 DIAGNOSIS — E291 Testicular hypofunction: Secondary | ICD-10-CM | POA: Diagnosis not present

## 2018-07-18 MED ORDER — TESTOSTERONE CYPIONATE 200 MG/ML IM SOLN
200.0000 mg | Freq: Once | INTRAMUSCULAR | Status: AC
  Administered 2018-07-18: 200 mg via INTRAMUSCULAR

## 2018-07-18 NOTE — Progress Notes (Signed)
Testosterone IM Injection  Due to Hypogonadism patient is present today for a Testosterone Injection.  Medication: Testosterone Cypionate Dose: 14mL Location: left upper outer buttocks Lot: [id number] Exp:10/2019  Patient tolerated well, no complications were noted  Preformed by: Gordy Clement, CMA ([place])  Follow up: RTC as scheduled

## 2018-07-23 ENCOUNTER — Encounter: Admission: RE | Payer: Self-pay | Source: Home / Self Care

## 2018-07-23 ENCOUNTER — Ambulatory Visit
Admission: RE | Admit: 2018-07-23 | Payer: Medicare Other | Source: Home / Self Care | Admitting: Unknown Physician Specialty

## 2018-07-23 SURGERY — EGD (ESOPHAGOGASTRODUODENOSCOPY)
Anesthesia: General

## 2018-07-30 ENCOUNTER — Telehealth: Payer: Self-pay | Admitting: Family Medicine

## 2018-07-30 NOTE — Telephone Encounter (Signed)
Pt came into office needing a new/working thermometer. Wondering if there would be one that he could have or purchase.  Please advise.  Thanks, [employer]

## 2018-08-07 ENCOUNTER — Telehealth: Payer: Self-pay | Admitting: Family Medicine

## 2018-08-07 NOTE — Telephone Encounter (Signed)
Pt would like Dr. Caryn Section to call him back regarding Ranitidine being removed from the shelves by FDA.  He was currently taking one tablet a day but has now stopped taking it.  His call back is [phone]  thanks teri

## 2018-08-07 NOTE — Telephone Encounter (Signed)
He can take OTC Pepcid (famotidine) 20 mg once a day.

## 2018-08-07 NOTE — Telephone Encounter (Signed)
Please advise on any alternative recommendations.

## 2018-08-08 NOTE — Telephone Encounter (Signed)
Tried calling patient. Left message to call back. 

## 2018-08-09 ENCOUNTER — Other Ambulatory Visit: Payer: Self-pay

## 2018-08-09 DIAGNOSIS — R053 Chronic cough: Secondary | ICD-10-CM

## 2018-08-09 DIAGNOSIS — R05 Cough: Secondary | ICD-10-CM

## 2018-08-09 MED ORDER — GUAIFENESIN-CODEINE 100-10 MG/5ML PO SYRP: ORAL_SOLUTION | ORAL | 5 refills | Status: DC

## 2018-08-09 NOTE — Telephone Encounter (Signed)
Patient advised.

## 2018-08-09 NOTE — Telephone Encounter (Signed)
Patient requesting refill. 

## 2018-08-12 ENCOUNTER — Other Ambulatory Visit: Payer: Self-pay | Admitting: Family Medicine

## 2018-08-22 ENCOUNTER — Ambulatory Visit (INDEPENDENT_AMBULATORY_CARE_PROVIDER_SITE_OTHER): Payer: Medicare Other

## 2018-08-22 ENCOUNTER — Other Ambulatory Visit: Payer: Self-pay

## 2018-08-22 DIAGNOSIS — E291 Testicular hypofunction: Secondary | ICD-10-CM | POA: Diagnosis not present

## 2018-08-22 MED ORDER — TESTOSTERONE CYPIONATE 200 MG/ML IM SOLN
200.0000 mg | Freq: Once | INTRAMUSCULAR | Status: AC
  Administered 2018-08-22: 200 mg via INTRAMUSCULAR

## 2018-08-22 NOTE — Progress Notes (Signed)
Testosterone IM Injection  Due to Hypogonadism patient is present today for a Testosterone Injection.  Medication: Testosterone Cypionate Dose: 30ml Location: right upper outer buttocks Lot: [id number] Exp:10/2019  Patient tolerated well, no complications were noted  Preformed by: Fonnie Jarvis, CMA  Follow up: 1 month for next injection

## 2018-08-31 ENCOUNTER — Telehealth: Payer: Self-pay

## 2018-08-31 MED ORDER — PREDNISONE 10 MG PO TABS: ORAL_TABLET | ORAL | 0 refills | Status: AC

## 2018-08-31 NOTE — Telephone Encounter (Signed)
Patient states he has a lengering cough that will not go away. Patient is requesting another medication to be send in for his cough. L.O.V. 06/08/2018, please advise.

## 2018-08-31 NOTE — Telephone Encounter (Signed)
Spoke to pt and he is asking for a pred taper to try bc it worked the last time.  Cough syrup only works for a temp bases.  Pharmacy is [facility].  Call back: [phone]

## 2018-08-31 NOTE — Telephone Encounter (Signed)
Does he want a refill for the codeine cough syrup sent earlier this month, or does he want to try something different

## 2018-08-31 NOTE — Telephone Encounter (Signed)
LMTCB 08/31/2018  Thanks,   -Mickel Baas

## 2018-09-04 ENCOUNTER — Telehealth: Payer: Self-pay

## 2018-09-04 NOTE — Telephone Encounter (Signed)
The prednisone starts at 60mg , which is the same dose as what he was prescribed last year, it's just more pill because they are only 10mg  pills. Anything else would only provide temporary relief cough, just like the cough syrup.

## 2018-09-04 NOTE — Telephone Encounter (Signed)
Patient calling that he pick up the prednisone from the pharmacy but is concern to take so many pills. He is asking is there another alternative for the cough or different type of medication to take. Reports that he is not going to take the Prednisone.

## 2018-09-05 NOTE — Telephone Encounter (Signed)
Patient advised and verbally voiced understanding.  

## 2018-09-14 ENCOUNTER — Other Ambulatory Visit: Payer: Self-pay | Admitting: Family Medicine

## 2018-09-14 DIAGNOSIS — M5137 Other intervertebral disc degeneration, lumbosacral region: Secondary | ICD-10-CM

## 2018-09-14 MED ORDER — HYDROCODONE-ACETAMINOPHEN 7.5-325 MG PO TABS: ORAL_TABLET | ORAL | 0 refills | Status: DC

## 2018-09-26 ENCOUNTER — Ambulatory Visit (INDEPENDENT_AMBULATORY_CARE_PROVIDER_SITE_OTHER): Payer: Medicare Other | Admitting: Family Medicine

## 2018-09-26 ENCOUNTER — Other Ambulatory Visit: Payer: Self-pay

## 2018-09-26 DIAGNOSIS — E291 Testicular hypofunction: Secondary | ICD-10-CM

## 2018-09-26 MED ORDER — TESTOSTERONE CYPIONATE 200 MG/ML IM SOLN
200.0000 mg | Freq: Once | INTRAMUSCULAR | Status: AC
  Administered 2018-09-26: 200 mg via INTRAMUSCULAR

## 2018-09-26 NOTE — Progress Notes (Signed)
Testosterone IM Injection  Due to Hypogonadism patient is present today for a Testosterone Injection.  Medication: Testosterone Cypionate Dose: 1Ml Location: right upper outer buttocks Lot: [id number] Exp:01/2020  Patient tolerated well, no complications were noted  Preformed by: Elberta Leatherwood, CMA  Follow up: 5 week

## 2018-10-04 DIAGNOSIS — R1310 Dysphagia, unspecified: Secondary | ICD-10-CM | POA: Diagnosis not present

## 2018-10-05 ENCOUNTER — Other Ambulatory Visit: Payer: Self-pay | Admitting: Unknown Physician Specialty

## 2018-10-05 ENCOUNTER — Telehealth: Payer: Self-pay

## 2018-10-05 DIAGNOSIS — R1310 Dysphagia, unspecified: Secondary | ICD-10-CM

## 2018-10-05 NOTE — Telephone Encounter (Signed)
Patient is requesting a prescription for Prednisone.

## 2018-10-08 MED ORDER — PREDNISONE 10 MG PO TABS: ORAL_TABLET | ORAL | 0 refills | Status: AC

## 2018-10-08 NOTE — Telephone Encounter (Signed)
Need more information. What does he want prednisone for?

## 2018-10-08 NOTE — Telephone Encounter (Signed)
Pt states he needs the prednisone for a sore throat and cough..  Temp 97.7  He has had the sore throat and cough on and off for several weeks.  No other symptoms  CB#  [phone]  Thanks [employer]

## 2018-10-11 ENCOUNTER — Other Ambulatory Visit: Payer: Self-pay

## 2018-10-11 ENCOUNTER — Ambulatory Visit: Admission: RE | Admit: 2018-10-11 | Payer: Medicare Other | Source: Ambulatory Visit

## 2018-10-11 ENCOUNTER — Ambulatory Visit
Admission: RE | Admit: 2018-10-11 | Discharge: 2018-10-11 | Disposition: A | Discharge status: Home / Self Care | Payer: Medicare Other | Source: Ambulatory Visit | Attending: Unknown Physician Specialty | Admitting: Unknown Physician Specialty

## 2018-10-11 DIAGNOSIS — K219 Gastro-esophageal reflux disease without esophagitis: Secondary | ICD-10-CM | POA: Diagnosis not present

## 2018-10-11 DIAGNOSIS — R1310 Dysphagia, unspecified: Secondary | ICD-10-CM | POA: Diagnosis not present

## 2018-10-28 ENCOUNTER — Other Ambulatory Visit: Payer: Self-pay | Admitting: Family Medicine

## 2018-10-28 DIAGNOSIS — I2510 Atherosclerotic heart disease of native coronary artery without angina pectoris: Secondary | ICD-10-CM

## 2018-10-31 ENCOUNTER — Other Ambulatory Visit: Payer: Self-pay

## 2018-10-31 ENCOUNTER — Ambulatory Visit: Payer: Medicare Other

## 2018-10-31 ENCOUNTER — Ambulatory Visit (INDEPENDENT_AMBULATORY_CARE_PROVIDER_SITE_OTHER): Payer: Medicare Other

## 2018-10-31 DIAGNOSIS — E291 Testicular hypofunction: Secondary | ICD-10-CM

## 2018-10-31 MED ORDER — TESTOSTERONE CYPIONATE 200 MG/ML IM SOLN
200.0000 mg | Freq: Once | INTRAMUSCULAR | Status: AC
  Administered 2018-10-31: 200 mg via INTRAMUSCULAR

## 2018-10-31 NOTE — Progress Notes (Signed)
Testosterone IM Injection  Due to Hypogonadism patient is present today for a Testosterone Injection.  Medication: Testosterone Cypionate Dose: 15mL Location: right upper outer buttocks Lot: [id number] Exp:01/2020  Patient tolerated well, no complications were noted  Preformed by: Gordy Clement, CMA   Follow up: As Scheduled

## 2018-11-01 ENCOUNTER — Other Ambulatory Visit
Admission: RE | Admit: 2018-11-01 | Discharge: 2018-11-01 | Disposition: A | Discharge status: Home / Self Care | Payer: Medicare Other | Source: Ambulatory Visit | Attending: Unknown Physician Specialty | Admitting: Unknown Physician Specialty

## 2018-11-01 DIAGNOSIS — Z01812 Encounter for preprocedural laboratory examination: Secondary | ICD-10-CM | POA: Diagnosis not present

## 2018-11-01 DIAGNOSIS — Z1159 Encounter for screening for other viral diseases: Secondary | ICD-10-CM | POA: Diagnosis not present

## 2018-11-01 LAB — SARS CORONAVIRUS 2 (TAT 6-24 HRS): SARS Coronavirus 2: NEGATIVE

## 2018-11-05 ENCOUNTER — Encounter
Admission: RE | Disposition: A | Discharge status: Home / Self Care | Payer: Self-pay | Source: Home / Self Care | Attending: Unknown Physician Specialty

## 2018-11-05 ENCOUNTER — Ambulatory Visit: Payer: Medicare Other | Admitting: Anesthesiology

## 2018-11-05 ENCOUNTER — Ambulatory Visit
Admission: RE | Admit: 2018-11-05 | Discharge: 2018-11-05 | Disposition: A | Discharge status: Home / Self Care | Payer: Medicare Other | Attending: Unknown Physician Specialty | Admitting: Unknown Physician Specialty

## 2018-11-05 ENCOUNTER — Other Ambulatory Visit: Payer: Self-pay

## 2018-11-05 DIAGNOSIS — I8501 Esophageal varices with bleeding: Secondary | ICD-10-CM | POA: Diagnosis not present

## 2018-11-05 DIAGNOSIS — I2510 Atherosclerotic heart disease of native coronary artery without angina pectoris: Secondary | ICD-10-CM | POA: Diagnosis not present

## 2018-11-05 DIAGNOSIS — K222 Esophageal obstruction: Secondary | ICD-10-CM | POA: Diagnosis not present

## 2018-11-05 DIAGNOSIS — K2970 Gastritis, unspecified, without bleeding: Secondary | ICD-10-CM | POA: Diagnosis not present

## 2018-11-05 DIAGNOSIS — R1310 Dysphagia, unspecified: Secondary | ICD-10-CM | POA: Insufficient documentation

## 2018-11-05 DIAGNOSIS — Z950 Presence of cardiac pacemaker: Secondary | ICD-10-CM | POA: Insufficient documentation

## 2018-11-05 DIAGNOSIS — I10 Essential (primary) hypertension: Secondary | ICD-10-CM | POA: Insufficient documentation

## 2018-11-05 DIAGNOSIS — K2950 Unspecified chronic gastritis without bleeding: Secondary | ICD-10-CM | POA: Insufficient documentation

## 2018-11-05 DIAGNOSIS — Z87891 Personal history of nicotine dependence: Secondary | ICD-10-CM | POA: Diagnosis not present

## 2018-11-05 DIAGNOSIS — K219 Gastro-esophageal reflux disease without esophagitis: Secondary | ICD-10-CM | POA: Diagnosis not present

## 2018-11-05 HISTORY — DX: Presence of cardiac pacemaker: Z95.0

## 2018-11-05 HISTORY — PX: ESOPHAGOGASTRODUODENOSCOPY (EGD) WITH PROPOFOL: SHX5813

## 2018-11-05 HISTORY — PX: BALLOON DILATION: SHX5330

## 2018-11-05 SURGERY — ESOPHAGOGASTRODUODENOSCOPY (EGD) WITH PROPOFOL
Anesthesia: General

## 2018-11-05 MED ORDER — PROPOFOL 500 MG/50ML IV EMUL
INTRAVENOUS | Status: DC | PRN
  Administered 2018-11-05: 150 ug/kg/min via INTRAVENOUS

## 2018-11-05 MED ORDER — SODIUM CHLORIDE 0.9 % IV SOLN
INTRAVENOUS | Status: DC
  Administered 2018-11-05: 09:00:00 via INTRAVENOUS

## 2018-11-05 MED ORDER — PROPOFOL 500 MG/50ML IV EMUL
INTRAVENOUS | Status: AC
  Filled 2018-11-05: qty 50

## 2018-11-05 MED ORDER — PROPOFOL 10 MG/ML IV BOLUS
INTRAVENOUS | Status: DC | PRN
  Administered 2018-11-05: 60 mg via INTRAVENOUS

## 2018-11-05 NOTE — Anesthesia Preprocedure Evaluation (Signed)
Anesthesia Evaluation  Patient identified by MRN, date of birth, ID band Patient awake    Reviewed: Allergy & Precautions, H&P , NPO status , Patient's Chart, lab work & pertinent test results, reviewed documented beta blocker date and time   Airway Mallampati: II   Neck ROM: full    Dental  (+) Poor Dentition   Pulmonary neg pulmonary ROS, former smoker,    Pulmonary exam normal        Cardiovascular Exercise Tolerance: Poor hypertension, + CAD  Normal cardiovascular exam+ pacemaker  Rhythm:regular Rate:Normal     Neuro/Psych  Neuromuscular disease negative psych ROS   GI/Hepatic Neg liver ROS, GERD  ,  Endo/Other  negative endocrine ROS  Renal/GU negative Renal ROS  negative genitourinary   Musculoskeletal   Abdominal   Peds  Hematology  (+) Blood dyscrasia, anemia ,   Anesthesia Other Findings Past Medical History: No date: BPH (benign prostatic hyperplasia) No date: CAD (coronary artery disease) No date: CAD (coronary artery disease) No date: Carpal tunnel syndrome No date: DDD (degenerative disc disease), cervical No date: Environmental and seasonal allergies No date: GERD (gastroesophageal reflux disease) No date: Glaucoma No date: HLD (hyperlipidemia) No date: Hypertension No date: Hypogonadism in male No date: Impotence No date: Nocturia No date: Presence of permanent cardiac pacemaker No date: Weight loss Past Surgical History: No date: APPENDECTOMY No date: BILATERAL CARPAL TUNNEL RELEASE     Comment:  05/12/2011, 06/09/2011 2004: CARDIAC CATHETERIZATION No date: coils in vessel     Comment:  6 coils No date: CORONARY ANGIOPLASTY     Comment:  x 5 2007: CORONARY ARTERY BYPASS GRAFT     Comment:  5; [facility] 2005: CORONARY STENT PLACEMENT 10/28/2015: ESOPHAGOGASTRODUODENOSCOPY (EGD) WITH PROPOFOL; N/A     Comment:  Procedure: ESOPHAGOGASTRODUODENOSCOPY (EGD) WITH               PROPOFOL;   Surgeon: Manya Silvas, MD;  Location: [facility]              ENDOSCOPY;  Service: Endoscopy;  Laterality: N/A; No date: KIDNEY STONE SURGERY 07/13/2017: PACEMAKER INSERTION; N/A     Comment:  Procedure: INSERTION PACEMAKER;  Surgeon: Isaias Cowman, MD;  Location: [facility] ORS;  Service:               Cardiovascular;  Laterality: N/A; No date: SHOULDER ARTHROSCOPY W/ ROTATOR CUFF REPAIR; Bilateral No date: TONSILLECTOMY 08/02/2010: UPPER GI ENDOSCOPY     Comment:  Dr. Tedra Coupe, gastritis. H Pylori negative   Reproductive/Obstetrics negative OB ROS                             Anesthesia Physical Anesthesia Plan  ASA: III  Anesthesia Plan: General   Post-op Pain Management:    Induction:   PONV Risk Score and Plan:   Airway Management Planned:   Additional Equipment:   Intra-op Plan:   Post-operative Plan:   Informed Consent: I have reviewed the patients History and Physical, chart, labs and discussed the procedure including the risks, benefits and alternatives for the proposed anesthesia with the patient or authorized representative who has indicated his/her understanding and acceptance.     Dental Advisory Given  Plan Discussed with: CRNA  Anesthesia Plan Comments:         Anesthesia Quick Evaluation

## 2018-11-05 NOTE — Op Note (Signed)
[facility] Gastroenterology Patient Name: Ivan Salazar Procedure Date: 11/05/2018 8:26 AM MRN: [id number] Account #: [id number] Date of Birth: 01-24-1932 Admit Type: Outpatient Age: 85 Room: [facility] ENDO ROOM 1 Gender: Male Note Status: Finalized Procedure:            Upper GI endoscopy Indications:          Dysphagia Providers:            Manya Silvas, MD Medicines:            Propofol per Anesthesia Complications:        No immediate complications. Procedure:            Pre-Anesthesia Assessment:                       - After reviewing the risks and benefits, the patient                        was deemed in satisfactory condition to undergo the                        procedure.                       After obtaining informed consent, the endoscope was                        passed under direct vision. Throughout the procedure,                        the patient's blood pressure, pulse, and oxygen                        saturations were monitored continuously. The Endoscope                        was introduced through the mouth, and advanced to the                        second part of duodenum. The upper GI endoscopy was                        somewhat difficult. The patient tolerated the procedure                        fairly well. Findings:      One benign-appearing, intrinsic moderate stenosis was found 40 cm from       the incisors. Esophagus was seen. The stenosis was traversed. A       guidewire was placed and the scope was withdrawn. Dilation was performed       with a Savary dilator with moderate resistance at 16 mm.      Patchy mild inflammation characterized by erythema and friability was       found in the gastric body.      Mild inflammation characterized by erythema, friability and granularity       was found in the stomach.      The examined duodenum was normal. Impression:           - Benign-appearing esophageal stenosis. Dilated.             - Chronic gastritis.                       -  Gastritis.                       - Normal examined duodenum.                       - No specimens collected. Recommendation:       - The findings and recommendations were discussed with                        the patient. Manya Silvas, MD 11/05/2018 9:01:14 AM This report has been signed electronically. Number of Addenda: 0 Note Initiated On: 11/05/2018 8:26 AM Estimated Blood Loss: Estimated blood loss: none.      [facility]

## 2018-11-05 NOTE — Anesthesia Postprocedure Evaluation (Signed)
Anesthesia Post Note  Patient: Ivan Salazar  Procedure(s) Performed: ESOPHAGOGASTRODUODENOSCOPY (EGD) WITH PROPOFOL (N/A ) BALLOON DILATION (N/A )  Patient location during evaluation: PACU Anesthesia Type: General Level of consciousness: awake and alert Pain management: pain level controlled Vital Signs Assessment: post-procedure vital signs reviewed and stable Respiratory status: spontaneous breathing, nonlabored ventilation, respiratory function stable and patient connected to nasal cannula oxygen Cardiovascular status: blood pressure returned to baseline and stable Postop Assessment: no apparent nausea or vomiting Anesthetic complications: no     Last Vitals:  Vitals:   11/05/18 0816  BP: (!) 108/59  Pulse: 64  Resp: 17  Temp: (!) 36 C  SpO2: 100%    Last Pain:  Vitals:   11/05/18 0816  TempSrc: Tympanic  PainSc: 0-No pain                 Molli Barrows

## 2018-11-05 NOTE — Anesthesia Post-op Follow-up Note (Signed)
Anesthesia QCDR form completed.        

## 2018-11-05 NOTE — Transfer of Care (Signed)
Immediate Anesthesia Transfer of Care Note  Patient: Ivan Salazar  Procedure(s) Performed: ESOPHAGOGASTRODUODENOSCOPY (EGD) WITH PROPOFOL (N/A ) BALLOON DILATION (N/A )  Patient Location: PACU  Anesthesia Type:General  Level of Consciousness: drowsy  Airway & Oxygen Therapy: Patient Spontanous Breathing and Patient connected to nasal cannula oxygen  Post-op Assessment: Report given to RN and Post -op Vital signs reviewed and stable  Post vital signs: Reviewed and stable  Last Vitals:  Vitals Value Taken Time  BP 128/67 11/05/18 0859  Temp    Pulse 64 11/05/18 0901  Resp 16 11/05/18 0901  SpO2 100 % 11/05/18 0901  Vitals shown include unvalidated device data.  Last Pain:  Vitals:   11/05/18 0816  TempSrc: Tympanic  PainSc: 0-No pain         Complications: No apparent anesthesia complications

## 2018-11-06 ENCOUNTER — Encounter: Payer: Self-pay | Admitting: Unknown Physician Specialty

## 2018-11-07 ENCOUNTER — Other Ambulatory Visit: Payer: Self-pay | Admitting: Family Medicine

## 2018-11-07 ENCOUNTER — Other Ambulatory Visit: Payer: Self-pay

## 2018-11-07 DIAGNOSIS — R05 Cough: Secondary | ICD-10-CM

## 2018-11-07 DIAGNOSIS — R053 Chronic cough: Secondary | ICD-10-CM

## 2018-11-07 MED ORDER — ZOLPIDEM TARTRATE 10 MG PO TABS: ORAL_TABLET | ORAL | 3 refills | Status: DC

## 2018-11-21 ENCOUNTER — Other Ambulatory Visit: Payer: Self-pay

## 2018-11-21 DIAGNOSIS — N138 Other obstructive and reflux uropathy: Secondary | ICD-10-CM

## 2018-11-21 DIAGNOSIS — N401 Enlarged prostate with lower urinary tract symptoms: Secondary | ICD-10-CM

## 2018-11-21 DIAGNOSIS — E291 Testicular hypofunction: Secondary | ICD-10-CM

## 2018-11-22 ENCOUNTER — Other Ambulatory Visit: Payer: Medicare Other

## 2018-11-27 ENCOUNTER — Other Ambulatory Visit: Payer: Self-pay

## 2018-11-27 ENCOUNTER — Other Ambulatory Visit: Payer: Medicare Other

## 2018-11-27 DIAGNOSIS — E291 Testicular hypofunction: Secondary | ICD-10-CM

## 2018-11-27 DIAGNOSIS — N138 Other obstructive and reflux uropathy: Secondary | ICD-10-CM

## 2018-11-27 DIAGNOSIS — R972 Elevated prostate specific antigen [PSA]: Secondary | ICD-10-CM | POA: Diagnosis not present

## 2018-11-28 LAB — TESTOSTERONE: Testosterone: 66 ng/dL — ABNORMAL LOW (ref 264–916)

## 2018-11-28 LAB — PSA: Prostate Specific Ag, Serum: 0.4 ng/mL (ref 0.0–4.0)

## 2018-11-28 NOTE — Progress Notes (Signed)
11/29/2018  11:22 AM   Ivan Salazar 12/05/1931 [id number]  Referring provider: Birdie Sons, MD [address] [place],  [place] [id number]  Chief Complaint  Patient presents with  . Benign Prostatic Hypertrophy  . Hypogonadism   HPI: Ivan Salazar is a 85 y.o.  male with BPH with LU TS and testosterone deficiency who presents today for follow up.   BPH WITH LUTS  (prostate and/or bladder) IPSS score: 6/1      Previous score: 4/1   Previous PVR: 45 mL  Major complaint(s):  Nocturia x 1.  Denies any dysuria, hematuria or suprapubic pain.   Denies any recent fevers, chills, nausea or vomiting.  IPSS    Row Name 11/29/18 1100         International Prostate Symptom Score   How often have you had the sensation of not emptying your bladder?  Not at All     How often have you had to urinate less than every two hours?  Not at All     How often have you found you stopped and started again several times when you urinated?  Less than half the time     How often have you found it difficult to postpone urination?  Not at All     How often have you had a weak urinary stream?  About half the time     How often have you had to strain to start urination?  Not at All     How many times did you typically get up at night to urinate?  1 Time     Total IPSS Score  6       Quality of Life due to urinary symptoms   If you were to spend the rest of your life with your urinary condition just the way it is now how would you feel about that?  Pleased        Score:  1-7 Mild 8-19 Moderate 20-35 Severe   Testosterone deficiency He is no longer having spontaneous erections at night.   He does not have sleep apnea.   He is currently managing his testosterone deficiency with testosterone cypionate 200 mg IM, 1 cc every week.  His current testosterone level is 66 ng/dL on 11/27/2018.     PMH: Past Medical History:  Diagnosis Date  . BPH (benign prostatic hyperplasia)   . CAD  (coronary artery disease)   . CAD (coronary artery disease)   . Carpal tunnel syndrome   . DDD (degenerative disc disease), cervical   . Environmental and seasonal allergies   . GERD (gastroesophageal reflux disease)   . Glaucoma   . HLD (hyperlipidemia)   . Hypertension   . Hypogonadism in male   . Impotence   . Nocturia   . Presence of permanent cardiac pacemaker   . Weight loss    Surgical History: Past Surgical History:  Procedure Laterality Date  . APPENDECTOMY    . BALLOON DILATION N/A 11/05/2018   Procedure: BALLOON DILATION;  Surgeon: Manya Silvas, MD;  Location: [facility] ENDOSCOPY;  Service: Endoscopy;  Laterality: N/A;  . BILATERAL CARPAL TUNNEL RELEASE     05/12/2011, 06/09/2011  . CARDIAC CATHETERIZATION  2004  . coils in vessel     6 coils  . CORONARY ANGIOPLASTY     x 5  . CORONARY ARTERY BYPASS GRAFT  2007   5; [facility]  . CORONARY STENT PLACEMENT  2005  . ESOPHAGOGASTRODUODENOSCOPY (EGD)  WITH PROPOFOL N/A 10/28/2015   Procedure: ESOPHAGOGASTRODUODENOSCOPY (EGD) WITH PROPOFOL;  Surgeon: Manya Silvas, MD;  Location: [facility] ENDOSCOPY;  Service: Endoscopy;  Laterality: N/A;  . ESOPHAGOGASTRODUODENOSCOPY (EGD) WITH PROPOFOL N/A 11/05/2018   Procedure: ESOPHAGOGASTRODUODENOSCOPY (EGD) WITH PROPOFOL;  Surgeon: Manya Silvas, MD;  Location: [facility] ENDOSCOPY;  Service: Endoscopy;  Laterality: N/A;  . KIDNEY STONE SURGERY    . PACEMAKER INSERTION N/A 07/13/2017   Procedure: INSERTION PACEMAKER;  Surgeon: Isaias Cowman, MD;  Location: [facility] ORS;  Service: Cardiovascular;  Laterality: N/A;  . SHOULDER ARTHROSCOPY W/ ROTATOR CUFF REPAIR Bilateral   . TONSILLECTOMY    . UPPER GI ENDOSCOPY  08/02/2010   Dr. Tedra Coupe, gastritis. H Pylori negative   Home Medications:  Allergies as of 11/29/2018      Reactions   Celebrex [celecoxib]    Noxius taste in mouth   Mirtazapine    Bad taste in mouth. Refuses to take again      Medication List       Accurate as of November 29, 2018 11:22 AM. If you have any questions, ask your nurse or doctor.        atorvastatin 80 MG tablet Commonly known as: LIPITOR TAKE 1 TABLET DAILY   clopidogrel 75 MG tablet Commonly known as: PLAVIX TAKE 1 TABLET BY MOUTH DAILY   enalapril 5 MG tablet Commonly known as: VASOTEC TAKE ONE TABLET BY MOUTH DAILY   fluticasone 50 MCG/ACT nasal spray Commonly known as: FLONASE INSTILL TWO SPRAYS IN EACH NOSTRIL ONCE DAILY   furosemide 20 MG tablet Commonly known as: LASIX Take 0.5 tablets (10 mg total) by mouth daily as needed for edema.   GaviLyte-N with Flavor Pack 420 g solution Generic drug: polyethylene glycol-electrolytes GaviLyte-N 420 gram oral solution   guaiFENesin-codeine 100-10 MG/5ML syrup TAKE 1-2 TEASPOONS (5-10 MLS) EVERY 6 HOURS AS NEEDED FOR COUGH   HYDROcodone-acetaminophen 7.5-325 MG tablet Commonly known as: NORCO 1 tablet every 4-6 hours as needed   hydrocortisone 2.5 % cream Apply topically 2 (two) times daily as needed.   latanoprost 0.005 % ophthalmic solution Commonly known as: XALATAN Place 1 drop into both eyes at bedtime.   metoprolol succinate 25 MG 24 hr tablet Commonly known as: TOPROL-XL TAKE ONE TABLET BY MOUTH DAILY   mupirocin ointment 2 % Commonly known as: BACTROBAN   nitroGLYCERIN 0.4 MG SL tablet Commonly known as: NITROSTAT Place 1 tablet (0.4 mg total) under the tongue every 5 (five) minutes as needed for chest pain.   ranitidine 300 MG tablet Commonly known as: ZANTAC TAKE 1 TABLET TWICE DAILY   testosterone cypionate 200 MG/ML injection Commonly known as: DEPOTESTOSTERONE CYPIONATE Inject 1 mL (200 mg total) into the muscle every 28 (twenty-eight) days.   zolpidem 10 MG tablet Commonly known as: AMBIEN TAKE 1/2 TO 1 TABLET EVERY NIGHT AT BEDTIME      Allergies:  Allergies  Allergen Reactions  . Celebrex [Celecoxib]     Noxius taste in mouth  . Mirtazapine     Bad taste in mouth. Refuses to take again    Family History: Family History  Problem Relation Age of Onset  . Cirrhosis Father        of liver  . Kidney disease Neg Hx   . Prostate cancer Neg Hx   . Kidney cancer Neg Hx   . Bladder Cancer Neg Hx    Social History:  reports that he quit smoking about 41 years ago. He has a 10.00 pack-year  smoking history. He has never used smokeless tobacco. He reports current alcohol use of about 14.0 standard drinks of alcohol per week. He reports that he does not use drugs.  ROS: UROLOGY Frequent Urination?: No Hard to postpone urination?: No Burning/pain with urination?: No Get up at night to urinate?: Yes Leakage of urine?: No Urine stream starts and stops?: No Trouble starting stream?: No Do you have to strain to urinate?: No Blood in urine?: No Urinary tract infection?: No Sexually transmitted disease?: No Injury to kidneys or bladder?: No Painful intercourse?: No Weak stream?: No Erection problems?: No Penile pain?: No  Gastrointestinal Nausea?: Yes Vomiting?: No Indigestion/heartburn?: No Diarrhea?: No Constipation?: No  Constitutional Fever: No Night sweats?: No Weight loss?: No Fatigue?: Yes  Skin Skin rash/lesions?: No Itching?: No  Eyes Blurred vision?: No Double vision?: Yes  Ears/Nose/Throat Sore throat?: No Sinus problems?: No  Hematologic/Lymphatic Swollen glands?: No Easy bruising?: No  Cardiovascular Leg swelling?: Yes Chest pain?: No  Respiratory Cough?: Yes Shortness of breath?: No  Endocrine Excessive thirst?: No  Musculoskeletal Back pain?: No Joint pain?: No  Neurological Headaches?: No Dizziness?: No  Psychologic Depression?: No Anxiety?: No  Physical Exam: BP 136/76 (BP Location: Left Arm, Patient Position: Sitting, Cuff Size: Normal)   Pulse 76   Ht 5\' 10"  (1.778 m)   Wt 143 lb (64.9 kg)   BMI 20.52 kg/m   Constitutional:  Well nourished. Alert and oriented, No acute distress. HEENT: [place] AT, moist mucus  membranes.  Trachea midline, no masses. Cardiovascular: No clubbing, cyanosis, or edema. Respiratory: Normal respiratory effort, no increased work of breathing. GI: Abdomen is soft, non tender, non distended, no abdominal masses. Liver and spleen not palpable.  No hernias appreciated.  Stool sample for occult testing is not indicated.   GU: No CVA tenderness.  No bladder fullness or masses.  Patient with circumcised phallus.  Urethral meatus is patent.  No penile discharge. No penile lesions or rashes. Scrotum without lesions, cysts, rashes and/or edema.  Testicles are located scrotally bilaterally. No masses are appreciated in the testicles. Left and right epididymis are normal. Rectal: Patient with  normal sphincter tone. Anus and perineum without scarring or rashes. No rectal masses are appreciated. Prostate is approximately 45 grams, no nodules are appreciated. Seminal vesicles could not be palpated . Skin: No rashes, bruises or suspicious lesions. Lymph: No cervical or inguinal adenopathy. Neurologic: Grossly intact, no focal deficits, moving all 4 extremities. Psychiatric: Normal mood and affect.  Laboratory Data: Lab Results  Component Value Date   WBC 4.8 04/27/2018   HGB 12.8 (L) 04/27/2018   HCT 37.6 04/27/2018   MCV 98 (H) 04/27/2018   PLT 161 04/27/2018   Lab Results  Component Value Date   CREATININE 1.39 (H) 04/27/2018   Lab Results  Component Value Date   TESTOSTERONE 66 (L) 11/27/2018   Pertinent Imaging: Results for orders placed or performed in visit on 11/27/18  Testosterone  Result Value Ref Range   Testosterone 66 (L) 264 - 916 ng/dL  PSA  Result Value Ref Range   Prostate Specific Ag, Serum 0.4 0.0 - 4.0 ng/mL   Assessment & Plan:    1.Testosterone deficiency  Most recent testosterone level is 66 ng/dL on 11/27/2018 (goal 450-600 ng/dL) Continue testosterone cypionate 200 mg IM , 1 cc every three weeks  RTC in 3 months for HCT/HBG, testosterone  2.  BPH with LUTS IPSS score is 6/1, it is improving Continue conservative management, avoiding bladder irritants  and timed voiding's Most bothersome symptoms is/are nocturia RTC in 6 months for IPSS, PSA and exam   Return in about 3 months (around 03/01/2019) for Testosterone (one week after injection) H & H only and return in 3 weeks for injections .  Zara Council, PA-C [facility] [address], [place], [place] [address] [phone]

## 2018-11-29 ENCOUNTER — Other Ambulatory Visit: Payer: Self-pay

## 2018-11-29 ENCOUNTER — Encounter: Payer: Self-pay | Admitting: Urology

## 2018-11-29 ENCOUNTER — Ambulatory Visit (INDEPENDENT_AMBULATORY_CARE_PROVIDER_SITE_OTHER): Payer: Medicare Other | Admitting: Urology

## 2018-11-29 VITALS — BP 136/76 | HR 76 | Ht 70.0 in | Wt 143.0 lb

## 2018-11-29 DIAGNOSIS — I2510 Atherosclerotic heart disease of native coronary artery without angina pectoris: Secondary | ICD-10-CM | POA: Diagnosis not present

## 2018-11-29 DIAGNOSIS — E349 Endocrine disorder, unspecified: Secondary | ICD-10-CM

## 2018-11-29 DIAGNOSIS — N138 Other obstructive and reflux uropathy: Secondary | ICD-10-CM | POA: Diagnosis not present

## 2018-11-29 DIAGNOSIS — N401 Enlarged prostate with lower urinary tract symptoms: Secondary | ICD-10-CM

## 2018-11-29 MED ORDER — TESTOSTERONE CYPIONATE 200 MG/ML IM SOLN
200.0000 mg | Freq: Once | INTRAMUSCULAR | Status: AC
  Administered 2018-11-29: 200 mg via INTRAMUSCULAR

## 2018-11-29 NOTE — Progress Notes (Signed)
Testosterone IM Injection  Due to Hypogonadism patient is present today for a Testosterone Injection.  Medication: Testosterone Cypionate Dose: 1ML Location: right upper outer buttocks Lot: [id number] Exp:01/2020  Patient tolerated well, no complications were noted  Preformed by: Elberta Leatherwood, CMA  Follow up: 1 month

## 2018-11-30 ENCOUNTER — Telehealth: Payer: Self-pay

## 2018-11-30 LAB — HEMOGLOBIN: Hemoglobin: 12 g/dL — ABNORMAL LOW (ref 13.0–17.7)

## 2018-11-30 LAB — HEMATOCRIT: Hematocrit: 35.5 % — ABNORMAL LOW (ref 37.5–51.0)

## 2018-11-30 NOTE — Telephone Encounter (Signed)
Called patient no answer and no vmail set up to leave a message

## 2018-11-30 NOTE — Telephone Encounter (Signed)
-----   Message from Nori Riis, PA-C sent at 11/30/2018  9:55 AM EDT ----- Please let Mr. Lamm know that his HCT and hbg are stable.

## 2018-12-04 NOTE — Telephone Encounter (Signed)
Left pt vmail

## 2018-12-19 ENCOUNTER — Other Ambulatory Visit: Payer: Self-pay | Admitting: Family Medicine

## 2018-12-19 DIAGNOSIS — M5137 Other intervertebral disc degeneration, lumbosacral region: Secondary | ICD-10-CM

## 2018-12-19 MED ORDER — HYDROCODONE-ACETAMINOPHEN 7.5-325 MG PO TABS: ORAL_TABLET | ORAL | 0 refills | Status: DC

## 2018-12-25 DIAGNOSIS — Z951 Presence of aortocoronary bypass graft: Secondary | ICD-10-CM | POA: Diagnosis not present

## 2018-12-25 DIAGNOSIS — Z950 Presence of cardiac pacemaker: Secondary | ICD-10-CM | POA: Diagnosis not present

## 2018-12-25 DIAGNOSIS — I495 Sick sinus syndrome: Secondary | ICD-10-CM | POA: Diagnosis not present

## 2018-12-25 DIAGNOSIS — R0602 Shortness of breath: Secondary | ICD-10-CM | POA: Diagnosis not present

## 2018-12-25 DIAGNOSIS — I2510 Atherosclerotic heart disease of native coronary artery without angina pectoris: Secondary | ICD-10-CM | POA: Diagnosis not present

## 2018-12-25 DIAGNOSIS — R609 Edema, unspecified: Secondary | ICD-10-CM | POA: Diagnosis not present

## 2018-12-25 DIAGNOSIS — I10 Essential (primary) hypertension: Secondary | ICD-10-CM | POA: Diagnosis not present

## 2018-12-26 DIAGNOSIS — H401131 Primary open-angle glaucoma, bilateral, mild stage: Secondary | ICD-10-CM | POA: Diagnosis not present

## 2019-01-02 ENCOUNTER — Other Ambulatory Visit: Payer: Self-pay | Admitting: Family Medicine

## 2019-01-03 ENCOUNTER — Other Ambulatory Visit: Payer: Self-pay

## 2019-01-03 ENCOUNTER — Ambulatory Visit (INDEPENDENT_AMBULATORY_CARE_PROVIDER_SITE_OTHER): Payer: Medicare Other | Admitting: Family Medicine

## 2019-01-03 DIAGNOSIS — E349 Endocrine disorder, unspecified: Secondary | ICD-10-CM

## 2019-01-03 MED ORDER — TESTOSTERONE CYPIONATE 200 MG/ML IM SOLN
200.0000 mg | Freq: Once | INTRAMUSCULAR | Status: AC
  Administered 2019-01-03: 200 mg via INTRAMUSCULAR

## 2019-01-03 NOTE — Progress Notes (Signed)
Testosterone IM Injection  Due to Hypogonadism patient is present today for a Testosterone Injection.  Medication: Testosterone Cypionate Dose: 87ml Location: right upper outer buttocks Lot: [id number] Exp:06/2020  Patient tolerated well, no complications were noted  Preformed by: Elberta Leatherwood, CMA  Follow up: 28 days

## 2019-02-01 ENCOUNTER — Other Ambulatory Visit: Payer: Self-pay | Admitting: Family Medicine

## 2019-02-01 DIAGNOSIS — R053 Chronic cough: Secondary | ICD-10-CM

## 2019-02-01 DIAGNOSIS — R05 Cough: Secondary | ICD-10-CM

## 2019-02-01 NOTE — Telephone Encounter (Signed)
Pharmacy requesting refills. Thanks!  

## 2019-02-07 ENCOUNTER — Other Ambulatory Visit: Payer: Self-pay

## 2019-02-07 ENCOUNTER — Ambulatory Visit (INDEPENDENT_AMBULATORY_CARE_PROVIDER_SITE_OTHER): Payer: Medicare Other | Admitting: *Deleted

## 2019-02-07 DIAGNOSIS — E349 Endocrine disorder, unspecified: Secondary | ICD-10-CM | POA: Diagnosis not present

## 2019-02-07 MED ORDER — TESTOSTERONE CYPIONATE 200 MG/ML IM SOLN
200.0000 mg | Freq: Once | INTRAMUSCULAR | Status: AC
  Administered 2019-02-07: 200 mg via INTRAMUSCULAR

## 2019-02-07 NOTE — Progress Notes (Signed)
Testosterone IM Injection  Due to Hypogonadism patient is present today for a Testosterone Injection.  Medication: Testosterone Cypionate Dose: 1ml Location: right upper outer buttocks Lot: [id number] Exp:07/30/2020  Patient tolerated well, no complications were noted  Performed by: Verlene Mayer, CMA  Follow up: 28 Days

## 2019-02-27 ENCOUNTER — Other Ambulatory Visit: Payer: Self-pay

## 2019-02-27 DIAGNOSIS — E349 Endocrine disorder, unspecified: Secondary | ICD-10-CM

## 2019-02-28 ENCOUNTER — Other Ambulatory Visit: Payer: Medicare Other

## 2019-02-28 ENCOUNTER — Other Ambulatory Visit: Payer: Self-pay

## 2019-02-28 DIAGNOSIS — E349 Endocrine disorder, unspecified: Secondary | ICD-10-CM | POA: Diagnosis not present

## 2019-03-01 LAB — HEMATOCRIT: Hematocrit: 34.3 % — ABNORMAL LOW (ref 37.5–51.0)

## 2019-03-01 LAB — TESTOSTERONE: Testosterone: 122 ng/dL — ABNORMAL LOW (ref 264–916)

## 2019-03-06 ENCOUNTER — Ambulatory Visit (INDEPENDENT_AMBULATORY_CARE_PROVIDER_SITE_OTHER): Payer: Medicare Other | Admitting: Family Medicine

## 2019-03-06 ENCOUNTER — Other Ambulatory Visit: Payer: Self-pay

## 2019-03-06 VITALS — BP 110/64 | HR 75 | Temp 97.1°F | Wt 146.0 lb

## 2019-03-06 DIAGNOSIS — R21 Rash and other nonspecific skin eruption: Secondary | ICD-10-CM

## 2019-03-06 DIAGNOSIS — M5137 Other intervertebral disc degeneration, lumbosacral region: Secondary | ICD-10-CM | POA: Diagnosis not present

## 2019-03-06 DIAGNOSIS — I2510 Atherosclerotic heart disease of native coronary artery without angina pectoris: Secondary | ICD-10-CM | POA: Diagnosis not present

## 2019-03-06 DIAGNOSIS — R609 Edema, unspecified: Secondary | ICD-10-CM

## 2019-03-06 DIAGNOSIS — G4700 Insomnia, unspecified: Secondary | ICD-10-CM

## 2019-03-06 DIAGNOSIS — Z23 Encounter for immunization: Secondary | ICD-10-CM

## 2019-03-06 MED ORDER — LEVOCETIRIZINE DIHYDROCHLORIDE 5 MG PO TABS: 5.0000 mg | ORAL_TABLET | Freq: Every evening | ORAL | 12 refills | Status: DC

## 2019-03-06 MED ORDER — HYDROCODONE-ACETAMINOPHEN 7.5-325 MG PO TABS: ORAL_TABLET | ORAL | 0 refills | Status: DC

## 2019-03-06 MED ORDER — ZOLPIDEM TARTRATE 10 MG PO TABS: ORAL_TABLET | ORAL | 3 refills | Status: DC

## 2019-03-06 NOTE — Patient Instructions (Signed)
.   Please review the attached list of medications and notify my office if there are any errors.   . Please bring all of your medications to every appointment so we can make sure that our medication list is the same as yours.   . It is especially important to get the annual flu vaccine this year. If you haven't had it already, please go to your pharmacy or call the office as soon as possible to schedule you flu shot.  

## 2019-03-06 NOTE — Progress Notes (Signed)
Ivan Salazar  MRN: [id number] DOB: 27-Jun-1931  Subjective:  HPI   The patient is an 85 year old male who presents for recheck of lower extremity edema. He reports that his swelling is the same as his last visit.  He also reports that his weight had gone done 10 pounds prior to his last visit.  He is trying to put weight back on and states he has gained back about 8 pounds.     Wt Readings from Last 3 Encounters:  03/06/19 146 lb (66.2 kg)  11/29/18 143 lb (64.9 kg)  11/05/18 145 lb (65.8 kg)   BP Readings from Last 3 Encounters:  03/06/19 110/64  11/29/18 136/76  11/05/18 (!) 108/59   Lab Results  Component Value Date   CREATININE 1.39 (H) 04/27/2018   BUN 23 04/27/2018   NA 139 04/27/2018   K 5.2 04/27/2018   CL 103 04/27/2018   CO2 19 (L) 04/27/2018   Rash-The patient also complains about a rash on his back.  He describes that rash as being pimple like.  He says it itches to the point that during his sleep he scratches so much it causes it to bleed.  He reports that it also goes across his chest. It has been present for several weeks with the back being worse than the chest. It is very itchy and states is usually flares for a few hours after taking hydrocodone/apap. However he also takes guaifenesin with codeine most days, but cannot  Tell if that caused rash to flare.   Patient Active Problem List   Diagnosis Date Noted  . Status post cardiac pacemaker procedure 07/13/2017  . Mitral insufficiency 06/17/2017  . OA (osteoarthritis) of neck 11/01/2016  . Eustachian tube dysfunction 09/03/2015  . Schatzki's ring 09/03/2015  . Adenomatous polyp of colon 09/03/2015  . Hypogonadism in male 03/09/2015  . BPH with obstruction/lower urinary tract symptoms 03/09/2015  . Loss of weight 03/05/2015  . Actinic keratoses 10/22/2014  . DDD (degenerative disc disease), lumbosacral 10/22/2014  . Acid reflux 10/22/2014  . Testicular hypofunction 06/27/2012  . Bursitis of hip 01/18/2011   . Family history of colonic polyps 12/24/2007  . CAD in native artery 08/16/2006  . H/O coronary artery bypass surgery 08/16/2006  . Anemia, iron deficiency 09/23/2005  . Cardiac enlargement 07/20/2005  . HLD (hyperlipidemia) 05/02/1998  . Insomnia, persistent 05/02/1998  . Essential (primary) hypertension 05/02/1998    Past Medical History:  Diagnosis Date  . BPH (benign prostatic hyperplasia)   . CAD (coronary artery disease)   . CAD (coronary artery disease)   . Carpal tunnel syndrome   . DDD (degenerative disc disease), cervical   . Environmental and seasonal allergies   . GERD (gastroesophageal reflux disease)   . Glaucoma   . HLD (hyperlipidemia)   . Hypertension   . Hypogonadism in male   . Impotence   . Nocturia   . Presence of permanent cardiac pacemaker   . Weight loss     Social History   Socioeconomic History  . Marital status: Widowed    Spouse name: Not on file  . Number of children: 5  . Years of education: Not on file  . Highest education level: Master's degree (e.g., MA, MS, MEng, MEd, MSW, MBA)  Occupational History  . Occupation: Retired  Scientific laboratory technician  . Financial resource strain: Not hard at all  . Food insecurity    Worry: Never true    Inability: Never true  .  Transportation needs    Medical: No    Non-medical: No  Tobacco Use  . Smoking status: Former Smoker    Packs/day: 0.50    Years: 20.00    Pack years: 10.00    Quit date: 05/01/1977    Years since quitting: 41.8  . Smokeless tobacco: Never Used  Substance and Sexual Activity  . Alcohol use: Yes    Alcohol/week: 14.0 standard drinks    Types: 14 Shots of liquor per week    Comment: moderate; 2 drinks daily  . Drug use: No  . Sexual activity: Not on file  Lifestyle  . Physical activity    Days per week: 0 days    Minutes per session: 0 min  . Stress: To some extent  Relationships  . Social Herbalist on phone: Patient refused    Gets together: Patient refused     Attends religious service: Patient refused    Active member of club or organization: Patient refused    Attends meetings of clubs or organizations: Patient refused    Relationship status: Patient refused  . Intimate partner violence    Fear of current or ex partner: Patient refused    Emotionally abused: Patient refused    Physically abused: Patient refused    Forced sexual activity: Patient refused  Other Topics Concern  . Not on file  Social History Narrative  . Not on file    Outpatient Encounter Medications as of 03/06/2019  Medication Sig Note  . atorvastatin (LIPITOR) 80 MG tablet TAKE ONE TABLET BY MOUTH DAILY   . clopidogrel (PLAVIX) 75 MG tablet TAKE 1 TABLET BY MOUTH DAILY   . enalapril (VASOTEC) 5 MG tablet TAKE ONE TABLET BY MOUTH DAILY   . fluticasone (FLONASE) 50 MCG/ACT nasal spray INSTILL TWO SPRAYS IN EACH NOSTRIL ONCE DAILY   . guaiFENesin-codeine 100-10 MG/5ML syrup TAKE 1-2 TEASPOONFULS (5-10 MLS) EVERY 6 HOURS AS NEEDED FOR COUGH   . HYDROcodone-acetaminophen (NORCO) 7.5-325 MG tablet 1 tablet every 4-6 hours as needed   . hydrocortisone 2.5 % cream Apply topically 2 (two) times daily as needed.   . latanoprost (XALATAN) 0.005 % ophthalmic solution Place 1 drop into both eyes at bedtime.    . metoprolol succinate (TOPROL-XL) 25 MG 24 hr tablet TAKE ONE TABLET BY MOUTH DAILY   . mupirocin ointment (BACTROBAN) 2 %    . nitroGLYCERIN (NITROSTAT) 0.4 MG SL tablet Place 1 tablet (0.4 mg total) under the tongue every 5 (five) minutes as needed for chest pain. 10/26/2017: PRN  . polyethylene glycol-electrolytes (GAVILYTE-N WITH FLAVOR PACK) 420 g solution GaviLyte-N 420 gram oral solution   . testosterone cypionate (DEPOTESTOSTERONE CYPIONATE) 200 MG/ML injection Inject 1 mL (200 mg total) into the muscle every 28 (twenty-eight) days.   Marland Kitchen zolpidem (AMBIEN) 10 MG tablet TAKE 1/2 TO 1 TABLET EVERY NIGHT AT BEDTIME   . furosemide (LASIX) 20 MG tablet Take 0.5 tablets (10 mg  total) by mouth daily as needed for edema. (Patient not taking: Reported on 11/05/2018)   . ranitidine (ZANTAC) 300 MG tablet TAKE 1 TABLET TWICE DAILY (Patient not taking: Reported on 03/06/2019)    No facility-administered encounter medications on file as of 03/06/2019.     Allergies  Allergen Reactions  . Celebrex [Celecoxib]     Noxius taste in mouth  . Mirtazapine     Bad taste in mouth. Refuses to take again    Review of Systems  Constitutional: Negative for chills,  diaphoresis, fever and malaise/fatigue.  HENT: Negative for congestion, ear pain, sinus pain and sore throat.   Respiratory: Negative for cough and shortness of breath.   Cardiovascular: Positive for leg swelling. Negative for chest pain.  Gastrointestinal: Negative for abdominal pain and diarrhea.  Musculoskeletal: Negative for myalgias.  Skin: Positive for itching and rash.  Neurological: Negative for headaches.    Objective:  BP 110/64 (BP Location: Right Arm, Patient Position: Sitting, Cuff Size: Normal)   Pulse 75   Temp (!) 97.1 F (36.2 C) (Skin)   Wt 146 lb (66.2 kg)   SpO2 93%   BMI 20.95 kg/m   Physical Exam   General Appearance:    Well developed, well nourished male in no acute distress  Eyes:    PERRL, conjunctiva/corneas clear, EOM's intact       Lungs:     Clear to auscultation bilaterally, respirations unlabored  Heart:    Normal heart rate. Normal rhythm.  2/6 blowing, holosystolic murmur at apex  MS:   All extremities are intact, 1+ bilateral ankle and distal leg edema. .   Skin:   Diffuse papular eruption across upper back with a few excoriations.        Assessment and Plan :   1. Rash of back Appearance and puritis is suggestive of drug rash- levocetirizine (XYZAL) 5 MG tablet; Take 1 tablet (5 mg total) by mouth every evening.  Dispense: 30 tablet; Refill: 12  Call if symptoms change or if not rapidly improving.     2. DDD (degenerative disc disease), lumbosacral refill -  HYDROcodone-acetaminophen (NORCO) 7.5-325 MG tablet; 1 tablet every 4-6 hours as needed  Dispense: 180 tablet; Refill: 0  3. Need for influenza vaccination  - Flu Vaccine QUAD High Dose(Fluad)  4. Edema, unspecified type Well controlled on current diuretics.    The entirety of the information documented in the History of Present Illness, Review of Systems and Physical Exam were personally obtained by me. Portions of this information were initially documented by Althea Charon CMA and reviewed by me for thoroughness and accuracy.

## 2019-03-14 ENCOUNTER — Other Ambulatory Visit: Payer: Self-pay

## 2019-03-14 ENCOUNTER — Ambulatory Visit (INDEPENDENT_AMBULATORY_CARE_PROVIDER_SITE_OTHER): Payer: Medicare Other | Admitting: Urology

## 2019-03-14 DIAGNOSIS — N529 Male erectile dysfunction, unspecified: Secondary | ICD-10-CM | POA: Diagnosis not present

## 2019-03-14 DIAGNOSIS — N401 Enlarged prostate with lower urinary tract symptoms: Secondary | ICD-10-CM

## 2019-03-14 DIAGNOSIS — E349 Endocrine disorder, unspecified: Secondary | ICD-10-CM | POA: Diagnosis not present

## 2019-03-14 DIAGNOSIS — M25619 Stiffness of unspecified shoulder, not elsewhere classified: Secondary | ICD-10-CM | POA: Insufficient documentation

## 2019-03-14 DIAGNOSIS — N138 Other obstructive and reflux uropathy: Secondary | ICD-10-CM

## 2019-03-14 DIAGNOSIS — E291 Testicular hypofunction: Secondary | ICD-10-CM

## 2019-03-14 MED ORDER — TESTOSTERONE CYPIONATE 200 MG/ML IM SOLN
200.0000 mg | Freq: Once | INTRAMUSCULAR | Status: AC
  Administered 2019-03-14: 200 mg via INTRAMUSCULAR

## 2019-03-14 NOTE — Progress Notes (Signed)
Testosterone IM Injection  Due to Hypogonadism patient is present today for a Testosterone Injection.  Medication: Testosterone Cypionate Dose: 22mL Location: right upper outer buttocks Lot: [id number] Exp:[id number]  Patient tolerated well, no complications were noted  Preformed by: Elizabeth Palau, CMA(AAMA)  Follow up: 28 days

## 2019-03-19 MED ORDER — TESTOSTERONE CYPIONATE 200 MG/ML IM SOLN: 200.0000 mg | INTRAMUSCULAR | 0 refills | Status: DC

## 2019-04-01 ENCOUNTER — Ambulatory Visit (INDEPENDENT_AMBULATORY_CARE_PROVIDER_SITE_OTHER): Payer: Medicare Other | Admitting: Physician Assistant

## 2019-04-01 ENCOUNTER — Encounter: Payer: Self-pay | Admitting: Physician Assistant

## 2019-04-01 DIAGNOSIS — J0140 Acute pansinusitis, unspecified: Secondary | ICD-10-CM

## 2019-04-01 DIAGNOSIS — I2510 Atherosclerotic heart disease of native coronary artery without angina pectoris: Secondary | ICD-10-CM | POA: Diagnosis not present

## 2019-04-01 MED ORDER — AZITHROMYCIN 250 MG PO TABS: ORAL_TABLET | ORAL | 0 refills | Status: DC

## 2019-04-01 MED ORDER — PREDNISONE 10 MG (21) PO TBPK: ORAL_TABLET | ORAL | 0 refills | Status: DC

## 2019-04-01 NOTE — Progress Notes (Signed)
Patient: Ivan Salazar Male    DOB: [birth year]   85 y.o.   MRN: [id number] Visit Date: 04/01/2019  Today's Provider: Mar Daring, PA-C   Chief Complaint  Patient presents with  . Sore Throat   Subjective:    I,Joseline E. Rosas,RMA am acting as a Education administrator for [employer], PA-C.   Virtual Visit via Telephone Note  I connected with Ivan Salazar on 04/01/19 at  2:00 PM EST by telephone and verified that I am speaking with the correct person using two identifiers.  Location: Patient: Home Provider: [facility]   I discussed the limitations, risks, security and privacy concerns of performing an evaluation and management service by telephone and the availability of in person appointments. I also discussed with the patient that there may be a patient responsible charge related to this service. The patient expressed understanding and agreed to proceed.   Sore Throat  This is a new problem. The current episode started in the past 7 days (Started 4-5 days ago with head cold type symptoms, sinus congestion, post nasal drainage that causes is throat to be sore off and on.). The problem has been unchanged. Neither side of throat is experiencing more pain than the other. There has been no fever. The pain is mild. Associated symptoms include congestion, coughing and a hoarse voice. Pertinent negatives include no abdominal pain, diarrhea, ear discharge, ear pain, headaches, plugged ear sensation, neck pain, shortness of breath, swollen glands, trouble swallowing or vomiting. He has had no exposure to strep or mono. Treatments tried: cough syrup. The treatment provided no relief.  Patient reports he has not been around anyone and does not wish to have covid 19 testing.    Allergies  Allergen Reactions  . Celebrex [Celecoxib]     Noxius taste in mouth  . Mirtazapine     Bad taste in mouth. Refuses to take again     Current Outpatient Medications:  .  atorvastatin (LIPITOR) 80  MG tablet, TAKE ONE TABLET BY MOUTH DAILY, Disp: 90 tablet, Rfl: 3 .  azithromycin (ZITHROMAX) 250 MG tablet, Take 2 tablets PO on day one, and one tablet PO daily thereafter until completed., Disp: 6 tablet, Rfl: 0 .  clopidogrel (PLAVIX) 75 MG tablet, TAKE 1 TABLET BY MOUTH DAILY, Disp: 90 tablet, Rfl: 3 .  enalapril (VASOTEC) 5 MG tablet, TAKE ONE TABLET BY MOUTH DAILY, Disp: 90 tablet, Rfl: 4 .  fluticasone (FLONASE) 50 MCG/ACT nasal spray, INSTILL TWO SPRAYS IN EACH NOSTRIL ONCE DAILY, Disp: 16 g, Rfl: 4 .  furosemide (LASIX) 20 MG tablet, Take 0.5 tablets (10 mg total) by mouth daily as needed for edema. (Patient not taking: Reported on 11/05/2018), Disp: 1 tablet, Rfl: 0 .  guaiFENesin-codeine 100-10 MG/5ML syrup, TAKE 1-2 TEASPOONFULS (5-10 MLS) EVERY 6 HOURS AS NEEDED FOR COUGH, Disp: 120 mL, Rfl: 3 .  HYDROcodone-acetaminophen (NORCO) 7.5-325 MG tablet, 1 tablet every 4-6 hours as needed, Disp: 180 tablet, Rfl: 0 .  hydrocortisone 2.5 % cream, Apply topically 2 (two) times daily as needed., Disp: 30 g, Rfl: 0 .  latanoprost (XALATAN) 0.005 % ophthalmic solution, Place 1 drop into both eyes at bedtime. , Disp: , Rfl:  .  levocetirizine (XYZAL) 5 MG tablet, Take 1 tablet (5 mg total) by mouth every evening., Disp: 30 tablet, Rfl: 12 .  metoprolol succinate (TOPROL-XL) 25 MG 24 hr tablet, TAKE ONE TABLET BY MOUTH DAILY, Disp: 90 tablet, Rfl: 4 .  mupirocin ointment (BACTROBAN) 2 %, , Disp: , Rfl:  .  nitroGLYCERIN (NITROSTAT) 0.4 MG SL tablet, Place 1 tablet (0.4 mg total) under the tongue every 5 (five) minutes as needed for chest pain., Disp: 20 tablet, Rfl: 1 .  polyethylene glycol-electrolytes (GAVILYTE-N WITH FLAVOR PACK) 420 g solution, GaviLyte-N 420 gram oral solution, Disp: , Rfl:  .  predniSONE (STERAPRED UNI-PAK 21 TAB) 10 MG (21) TBPK tablet, 6 day taper; take as directed on package instructions, Disp: 21 tablet, Rfl: 0 .  testosterone cypionate (DEPOTESTOSTERONE CYPIONATE) 200 MG/ML  injection, Inject 1 mL (200 mg total) into the muscle every 28 (twenty-eight) days., Disp: 3 mL, Rfl: 0 .  zolpidem (AMBIEN) 10 MG tablet, TAKE 1/2 TO 1 TABLET EVERY NIGHT AT BEDTIME, Disp: 30 tablet, Rfl: 3  Review of Systems  Constitutional: Negative for fatigue and fever.  HENT: Positive for congestion, hoarse voice, postnasal drip, sinus pain, sore throat (with post nasal drainage only) and voice change. Negative for ear discharge, ear pain, sneezing and trouble swallowing.   Respiratory: Positive for cough. Negative for shortness of breath.   Gastrointestinal: Negative for abdominal pain, diarrhea and vomiting.  Musculoskeletal: Negative for neck pain.  Neurological: Negative for headaches.    Social History   Tobacco Use  . Smoking status: Former Smoker    Packs/day: 0.50    Years: 20.00    Pack years: 10.00    Quit date: 05/01/1977    Years since quitting: 41.9  . Smokeless tobacco: Never Used  Substance Use Topics  . Alcohol use: Yes    Alcohol/week: 14.0 standard drinks    Types: 14 Shots of liquor per week    Comment: moderate; 2 drinks daily      Objective:   There were no vitals taken for this visit. There were no vitals filed for this visit.There is no height or weight on file to calculate BMI.   Physical Exam Vitals signs reviewed.  Constitutional:      General: He is not in acute distress. Pulmonary:     Effort: No respiratory distress.  Neurological:     Mental Status: He is alert.      No results found for any visits on 04/01/19.     Assessment & Plan     1. Acute non-recurrent pansinusitis Worsening symptoms that have not responded to OTC medications. Will give azithromycin and prednisone as below. Continue allergy medications. Stay well hydrated and get plenty of rest. Advised to isolate until he is symptom free x 3 days as if he has covid 19 since he does not wish to be tested at this time. Call if no symptom improvement or if symptoms worsen.  - azithromycin (ZITHROMAX) 250 MG tablet; Take 2 tablets PO on day one, and one tablet PO daily thereafter until completed.  Dispense: 6 tablet; Refill: 0 - predniSONE (STERAPRED UNI-PAK 21 TAB) 10 MG (21) TBPK tablet; 6 day taper; take as directed on package instructions  Dispense: 21 tablet; Refill: 0   I discussed the assessment and treatment plan with the patient. The patient was provided an opportunity to ask questions and all were answered. The patient agreed with the plan and demonstrated an understanding of the instructions.   The patient was advised to call back or seek an in-person evaluation if the symptoms worsen or if the condition fails to improve as anticipated.  I provided 10 minutes of non-face-to-face time during this encounter.    Mar Daring, PA-C  [facility]

## 2019-04-14 ENCOUNTER — Other Ambulatory Visit: Payer: Self-pay | Admitting: Family Medicine

## 2019-04-14 DIAGNOSIS — J3489 Other specified disorders of nose and nasal sinuses: Secondary | ICD-10-CM

## 2019-04-17 ENCOUNTER — Ambulatory Visit (INDEPENDENT_AMBULATORY_CARE_PROVIDER_SITE_OTHER): Payer: Medicare Other

## 2019-04-17 ENCOUNTER — Other Ambulatory Visit: Payer: Self-pay

## 2019-04-17 DIAGNOSIS — E349 Endocrine disorder, unspecified: Secondary | ICD-10-CM

## 2019-04-17 MED ORDER — TESTOSTERONE CYPIONATE 200 MG/ML IM SOLN
200.0000 mg | Freq: Once | INTRAMUSCULAR | Status: AC
  Administered 2019-04-17: 200 mg via INTRAMUSCULAR

## 2019-04-17 NOTE — Progress Notes (Signed)
Testosterone IM Injection  Due to Hypogonadism patient is present today for a Testosterone Injection.  Medication: Testosterone Cypionate Dose: 55mL Location: right upper outer buttocks Lot: [id number] Exp:08/2020  Patient tolerated well, no complications were noted  Preformed by: Gordy Clement, CMA (AAMA)  Follow up: RTC in 28 days for next injection

## 2019-04-18 ENCOUNTER — Ambulatory Visit: Payer: Medicare Other

## 2019-04-19 ENCOUNTER — Other Ambulatory Visit: Payer: Self-pay | Admitting: Family Medicine

## 2019-04-19 DIAGNOSIS — R053 Chronic cough: Secondary | ICD-10-CM

## 2019-04-19 DIAGNOSIS — R05 Cough: Secondary | ICD-10-CM

## 2019-04-19 NOTE — Telephone Encounter (Signed)
Requested medication (s) are due for refill today:no  Requested medication (s) are on the active medication list: yes  Last refill:  03/30/2019  Future visit scheduled: yes  Notes to clinic:  Medication not assigned to a protocol, review manually   Requested Prescriptions  Pending Prescriptions Disp Refills   guaiFENesin-codeine 100-10 MG/5ML syrup [Pharmacy Med Name: guaiFENesin-CODEIN 100-10MG /5ML SOL] 120 mL 2    Sig: TAKE 5-10MLS (1-2 TEASPOONFULS) BY MOUTH EVERY 6 HOURS AS NEEDED FOR COUGH      Off-Protocol Failed - 04/19/2019 12:26 PM      Failed - Medication not assigned to a protocol, review manually.      Passed - Valid encounter within last 12 months    Recent Outpatient Visits           2 weeks ago Acute non-recurrent pansinusitis   [place], [place]   1 month ago Rash of back   [facility] Birdie Sons, MD   10 months ago Loss of weight   [facility] Birdie Sons, MD   11 months ago Loss of weight   [facility] Birdie Sons, MD   1 year ago DDD (degenerative disc disease), lumbosacral   [facility] Birdie Sons, MD       Future Appointments             In 3 weeks Fisher, Kirstie Peri, MD [facility], [place]

## 2019-05-01 ENCOUNTER — Ambulatory Visit: Payer: Medicare Other

## 2019-05-13 ENCOUNTER — Ambulatory Visit (INDEPENDENT_AMBULATORY_CARE_PROVIDER_SITE_OTHER): Payer: Medicare Other | Admitting: Family Medicine

## 2019-05-13 ENCOUNTER — Encounter: Payer: Self-pay | Admitting: Family Medicine

## 2019-05-13 ENCOUNTER — Other Ambulatory Visit: Payer: Self-pay

## 2019-05-13 VITALS — BP 144/70 | HR 82 | Temp 97.3°F | Wt 147.8 lb

## 2019-05-13 DIAGNOSIS — R609 Edema, unspecified: Secondary | ICD-10-CM

## 2019-05-13 DIAGNOSIS — R053 Chronic cough: Secondary | ICD-10-CM

## 2019-05-13 DIAGNOSIS — K222 Esophageal obstruction: Secondary | ICD-10-CM | POA: Diagnosis not present

## 2019-05-13 DIAGNOSIS — M5137 Other intervertebral disc degeneration, lumbosacral region: Secondary | ICD-10-CM | POA: Diagnosis not present

## 2019-05-13 DIAGNOSIS — R05 Cough: Secondary | ICD-10-CM

## 2019-05-13 MED ORDER — HYDROCODONE-ACETAMINOPHEN 7.5-325 MG PO TABS: ORAL_TABLET | ORAL | 0 refills | Status: DC

## 2019-05-13 NOTE — Progress Notes (Signed)
Patient: Ivan Salazar Male    DOB: 1932-01-18   86 y.o.   MRN: [id number] Visit Date: 05/13/2019  Today's Provider: Lelon Huh, MD   Chief Complaint  Patient presents with  . Leg Swelling   Subjective:     HPI  Follow up for Edema:  The patient was last seen for this 2 months ago. Changes made at last visit include none; symptoms were well controlled on current diuretics.  He reports fair compliance with treatment. Patient reports taking Lasix only one time.no dyspnea He feels that condition is stablized per patient. He is not having side effects.   ------------------------------------------------------------------------------------  He is also continuing on scheduled dose of hydrocodone/apap for back pain which remains very effective for him. tolerating well, no constipation. No change in alertness of memory. He also has chronic cough which responds well to tylenol with codeine.   Allergies  Allergen Reactions  . Celebrex [Celecoxib]     Noxius taste in mouth  . Mirtazapine     Bad taste in mouth. Refuses to take again     Current Outpatient Medications:  .  atorvastatin (LIPITOR) 80 MG tablet, TAKE ONE TABLET BY MOUTH DAILY, Disp: 90 tablet, Rfl: 3 .  clopidogrel (PLAVIX) 75 MG tablet, TAKE 1 TABLET BY MOUTH DAILY, Disp: 90 tablet, Rfl: 3 .  enalapril (VASOTEC) 5 MG tablet, TAKE ONE TABLET BY MOUTH DAILY, Disp: 90 tablet, Rfl: 4 .  fluticasone (FLONASE) 50 MCG/ACT nasal spray, SPRAY TWO SPRAYS IN EACH NOSTRIL ONCE DAILY, Disp: 16 mL, Rfl: 3 .  guaiFENesin-codeine 100-10 MG/5ML syrup, TAKE 5-10MLS (1-2 TEASPOONFULS) BY MOUTH EVERY 6 HOURS AS NEEDED FOR COUGH, Disp: 120 mL, Rfl: 2 .  HYDROcodone-acetaminophen (NORCO) 7.5-325 MG tablet, 1 tablet every 4-6 hours as needed, Disp: 180 tablet, Rfl: 0 .  hydrocortisone 2.5 % cream, Apply topically 2 (two) times daily as needed., Disp: 30 g, Rfl: 0 .  latanoprost (XALATAN) 0.005 % ophthalmic solution, Place 1 drop  into both eyes at bedtime. , Disp: , Rfl:  .  levocetirizine (XYZAL) 5 MG tablet, Take 1 tablet (5 mg total) by mouth every evening., Disp: 30 tablet, Rfl: 12 .  metoprolol succinate (TOPROL-XL) 25 MG 24 hr tablet, TAKE ONE TABLET BY MOUTH DAILY, Disp: 90 tablet, Rfl: 4 .  mupirocin ointment (BACTROBAN) 2 %, , Disp: , Rfl:  .  nitroGLYCERIN (NITROSTAT) 0.4 MG SL tablet, Place 1 tablet (0.4 mg total) under the tongue every 5 (five) minutes as needed for chest pain., Disp: 20 tablet, Rfl: 1 .  polyethylene glycol-electrolytes (GAVILYTE-N WITH FLAVOR PACK) 420 g solution, GaviLyte-N 420 gram oral solution, Disp: , Rfl:  .  predniSONE (STERAPRED UNI-PAK 21 TAB) 10 MG (21) TBPK tablet, 6 day taper; take as directed on package instructions, Disp: 21 tablet, Rfl: 0 .  testosterone cypionate (DEPOTESTOSTERONE CYPIONATE) 200 MG/ML injection, Inject 1 mL (200 mg total) into the muscle every 28 (twenty-eight) days., Disp: 3 mL, Rfl: 0 .  zolpidem (AMBIEN) 10 MG tablet, TAKE 1/2 TO 1 TABLET EVERY NIGHT AT BEDTIME, Disp: 30 tablet, Rfl: 3 .  furosemide (LASIX) 20 MG tablet, Take 0.5 tablets (10 mg total) by mouth daily as needed for edema. (Patient not taking: Reported on 05/13/2019), Disp: 1 tablet, Rfl: 0  Review of Systems  Constitutional: Negative for appetite change, chills and fever.  Respiratory: Negative for chest tightness, shortness of breath and wheezing.   Cardiovascular: Positive for leg swelling. Negative for chest  pain and palpitations.  Gastrointestinal: Negative for abdominal pain, nausea and vomiting.    Social History   Tobacco Use  . Smoking status: Former Smoker    Packs/day: 0.50    Years: 20.00    Pack years: 10.00    Quit date: 05/01/1977    Years since quitting: 42.0  . Smokeless tobacco: Never Used  Substance Use Topics  . Alcohol use: Yes    Alcohol/week: 14.0 standard drinks    Types: 14 Shots of liquor per week    Comment: moderate; 2 drinks daily      Objective:    BP (!) 167/80 (BP Location: Right Arm, Patient Position: Sitting, Cuff Size: Normal)   Pulse 82   Temp (!) 97.3 F (36.3 C) (Temporal)   Wt 147 lb 12.8 oz (67 kg)   SpO2 99%   BMI 21.21 kg/m  Vitals:   05/13/19 0840  BP: (!) 167/80  Pulse: 82  Temp: (!) 97.3 F (36.3 C)  TempSrc: Temporal  SpO2: 99%  Weight: 147 lb 12.8 oz (67 kg)  Body mass index is 21.21 kg/m.   Physical Exam   General Appearance:    Well developed, well nourished male in no acute distress  Eyes:    PERRL, conjunctiva/corneas clear, EOM's intact       Lungs:     Clear to auscultation bilaterally, respirations unlabored  Heart:    Normal heart rate. Normal rhythm.  2/6 blowing, holosystolic murmur at apex  MS:   All extremities are intact. 1+ right LE edema, trace left.   Neurologic:   Awake, alert, oriented x 3. No apparent focal neurological           defect.           Assessment & Plan     1. DDD (degenerative disc disease), lumbosacral Pain well controlled. Continue current medications.   - HYDROcodone-acetaminophen (NORCO) 7.5-325 MG tablet; 1 tablet every 4-6 hours as needed  Dispense: 180 tablet; Refill: 0  2. Schatzki's ring S/p dilation dr [name].   3. Chronic cough Well controlled with pan tylenol with codine.   4. Edema, unspecified type Minimal since pacemaker placed. Rarely takes furosemide.    The entirety of the information documented in the History of Present Illness, Review of Systems and Physical Exam were personally obtained by me. Portions of this information were initially documented by Meyer Cory, CMA and reviewed by me for thoroughness and accuracy.       Lelon Huh, MD  [facility]

## 2019-05-14 NOTE — Progress Notes (Signed)
Subjective:   Ivan Salazar is a 86 y.o. male who presents for Medicare Annual/Subsequent preventive examination.    This visit is being conducted through telemedicine due to the COVID-19 pandemic. This patient has given me verbal consent via doximity to conduct this visit, patient states they are participating from their home address. Some vital signs may be absent or patient reported.    Patient identification: identified by name, DOB, and current address  Review of Systems:  N/A  Cardiac Risk Factors include: dyslipidemia;male gender;sedentary lifestyle     Objective:    Vitals: There were no vitals taken for this visit.  There is no height or weight on file to calculate BMI. Unable to obtain vitals due to visit being conducted via telephonically.   Advanced Directives 05/15/2019 11/05/2018 04/18/2018 07/13/2017 07/06/2017 04/05/2017 10/14/2016  Does Patient Have a Medical Advance Directive? Yes No Yes No No Yes No  Type of Paramedic of [place];Living will - [place];Living will - - Living will;Healthcare Power of Attorney -  Copy of Justice in Chart? No - copy requested - No - copy requested - - No - copy requested -  Would patient like information on creating a medical advance directive? - No - Patient declined - No - Patient declined - - -    Tobacco Social History   Tobacco Use  Smoking Status Former Smoker  . Packs/day: 0.50  . Years: 20.00  . Pack years: 10.00  . Quit date: 05/01/1977  . Years since quitting: 42.0  Smokeless Tobacco Never Used     Counseling given: Not Answered   Clinical Intake:  Pre-visit preparation completed: Yes  Pain : No/denies pain Pain Score: 0-No pain     Nutritional Risks: None Diabetes: No  How often do you need to have someone help you when you read instructions, pamphlets, or other written materials from your doctor or pharmacy?: 1 - Never  Interpreter  Needed?: No  Information entered by :: [facility], LPN  Past Medical History:  Diagnosis Date  . BPH (benign prostatic hyperplasia)   . CAD (coronary artery disease)   . Carpal tunnel syndrome   . DDD (degenerative disc disease), cervical   . Environmental and seasonal allergies   . GERD (gastroesophageal reflux disease)   . Glaucoma   . Impotence   . Nocturia   . Presence of permanent cardiac pacemaker   . Weight loss    Past Surgical History:  Procedure Laterality Date  . APPENDECTOMY    . BALLOON DILATION N/A 11/05/2018   Procedure: BALLOON DILATION;  Surgeon: Manya Silvas, MD;  Location[facility] ENDOSCOPY;  Service: Endoscopy;  Laterality: N/A;  . BILATERAL CARPAL TUNNEL RELEASE     05/12/2011, 06/09/2011  . CARDIAC CATHETERIZATION  2004  . coils in vessel     6 coils  . CORONARY ANGIOPLASTY     x 5  . CORONARY ARTERY BYPASS GRAFT  2007   5; [facility]  . CORONARY STENT PLACEMENT  2005  . ESOPHAGOGASTRODUODENOSCOPY (EGD) WITH PROPOFOL N/A 10/28/2015   Procedure: ESOPHAGOGASTRODUODENOSCOPY (EGD) WITH PROPOFOL;  Surgeon: Manya Silvas, MD;  Location[facility] ENDOSCOPY;  Service: Endoscopy;  Laterality: N/A;  . ESOPHAGOGASTRODUODENOSCOPY (EGD) WITH PROPOFOL N/A 11/05/2018   Procedure: ESOPHAGOGASTRODUODENOSCOPY (EGD) WITH PROPOFOL;  Surgeon: Manya Silvas, MD;  Location: [facility] ENDOSCOPY;  Service: Endoscopy;  Laterality: N/A;  . KIDNEY STONE SURGERY    . PACEMAKER INSERTION N/A 07/13/2017  Procedure: INSERTION PACEMAKER;  Surgeon: Isaias Cowman, MD;  Location: [facility] ORS;  Service: Cardiovascular;  Laterality: N/A;  . SHOULDER ARTHROSCOPY W/ ROTATOR CUFF REPAIR Bilateral   . TONSILLECTOMY    . UPPER GI ENDOSCOPY  08/02/2010   Dr. Tedra Coupe, gastritis. H Pylori negative   Family History  Problem Relation Age of Onset  . Cirrhosis Father        of liver  . Cancer Daughter   . Kidney disease Neg Hx   . Prostate cancer Neg Hx   . Kidney cancer Neg Hx   . Bladder Cancer  Neg Hx    Social History   Socioeconomic History  . Marital status: Widowed    Spouse name: Not on file  . Number of children: 5  . Years of education: Not on file  . Highest education level: Master's degree (e.g., MA, MS, MEng, MEd, MSW, MBA)  Occupational History  . Occupation: Retired  Tobacco Use  . Smoking status: Former Smoker    Packs/day: 0.50    Years: 20.00    Pack years: 10.00    Quit date: 05/01/1977    Years since quitting: 42.0  . Smokeless tobacco: Never Used  Substance and Sexual Activity  . Alcohol use: Yes    Alcohol/week: 7.0 - 14.0 standard drinks    Types: 7 - 14 Shots of liquor per week    Comment: moderate; 1-2 drinks daily  . Drug use: No  . Sexual activity: Not on file  Other Topics Concern  . Not on file  Social History Narrative  . Not on file   Social Determinants of Health   Financial Resource Strain: Low Risk   . Difficulty of Paying Living Expenses: Not hard at all  Food Insecurity: No Food Insecurity  . Worried About Charity fundraiser in the Last Year: Never true  . Ran Out of Food in the Last Year: Never true  Transportation Needs: No Transportation Needs  . Lack of Transportation (Medical): No  . Lack of Transportation (Non-Medical): No  Physical Activity: Insufficiently Active  . Days of Exercise per Week: 3 days  . Minutes of Exercise per Session: 30 min  Stress: No Stress Concern Present  . Feeling of Stress : Not at all  Social Connections: Slightly Isolated  . Frequency of Communication with Friends and Family: More than three times a week  . Frequency of Social Gatherings with Friends and Family: Never  . Attends Religious Services: More than 4 times per year  . Active Member of Clubs or Organizations: Yes  . Attends Archivist Meetings: More than 4 times per year  . Marital Status: Widowed    Outpatient Encounter Medications as of 05/15/2019  Medication Sig  . atorvastatin (LIPITOR) 80 MG tablet TAKE ONE  TABLET BY MOUTH DAILY  . clopidogrel (PLAVIX) 75 MG tablet TAKE 1 TABLET BY MOUTH DAILY  . enalapril (VASOTEC) 5 MG tablet TAKE ONE TABLET BY MOUTH DAILY  . fluticasone (FLONASE) 50 MCG/ACT nasal spray SPRAY TWO SPRAYS IN EACH NOSTRIL ONCE DAILY  . guaiFENesin-codeine 100-10 MG/5ML syrup TAKE 5-10MLS (1-2 TEASPOONFULS) BY MOUTH EVERY 6 HOURS AS NEEDED FOR COUGH  . HYDROcodone-acetaminophen (NORCO) 7.5-325 MG tablet 1 tablet every 4-6 hours as needed  . hydrocortisone 2.5 % cream Apply topically 2 (two) times daily as needed.  . latanoprost (XALATAN) 0.005 % ophthalmic solution Place 1 drop into both eyes at bedtime.   Marland Kitchen levocetirizine (XYZAL) 5 MG tablet Take 1 tablet (  5 mg total) by mouth every evening.  . metoprolol succinate (TOPROL-XL) 25 MG 24 hr tablet TAKE ONE TABLET BY MOUTH DAILY  . mupirocin ointment (BACTROBAN) 2 %   . nitroGLYCERIN (NITROSTAT) 0.4 MG SL tablet Place 1 tablet (0.4 mg total) under the tongue every 5 (five) minutes as needed for chest pain.  . polyethylene glycol-electrolytes (GAVILYTE-N WITH FLAVOR PACK) 420 g solution GaviLyte-N 420 gram oral solution  . testosterone cypionate (DEPOTESTOSTERONE CYPIONATE) 200 MG/ML injection Inject 1 mL (200 mg total) into the muscle every 28 (twenty-eight) days.  Marland Kitchen zolpidem (AMBIEN) 10 MG tablet TAKE 1/2 TO 1 TABLET EVERY NIGHT AT BEDTIME  . furosemide (LASIX) 20 MG tablet Take 0.5 tablets (10 mg total) by mouth daily as needed for edema. (Patient not taking: Reported on 05/13/2019)   No facility-administered encounter medications on file as of 05/15/2019.    Activities of Daily Living In your present state of health, do you have any difficulty performing the following activities: 05/15/2019 05/13/2019  Hearing? Y Y  Comment Does not wear hearing aids. -  Vision? N N  Difficulty concentrating or making decisions? N Y  Walking or climbing stairs? N N  Dressing or bathing? N N  Doing errands, shopping? N N  Preparing Food and  eating ? N -  Using the Toilet? N -  In the past six months, have you accidently leaked urine? N -  Do you have problems with loss of bowel control? N -  Managing your Medications? N -  Managing your Finances? N -  Housekeeping or managing your Housekeeping? N -  Some recent data might be hidden    Patient Care Team: Birdie Sons, MD as PCP - General (Family Medicine) Isaias Cowman, MD as Consulting Physician (Cardiology) Eulogio Bear, MD as Consulting Physician (Ophthalmology) Laneta Simmers as Physician Assistant (Urology) [facility] (Dermatology)   Assessment:   This is a routine wellness examination for [place].  Exercise Activities and Dietary recommendations Current Exercise Habits: Home exercise routine, Type of exercise: walking, Time (Minutes): 30, Frequency (Times/Week): 3, Weekly Exercise (Minutes/Week): 90, Intensity: Mild, Exercise limited by: Other - see comments(balance issues)  Goals    . DIET - INCREASE WATER INTAKE     Recommend to drink at least 6-8 8oz glasses of water per day.        . Exercise 3x per week (30 min per time)     Recommend for pt to start back walking and stretching 3 days a week for at least 30 minutes.        Fall Risk: Fall Risk  05/15/2019 05/13/2019 04/18/2018 11/27/2017 04/05/2017  Falls in the past year? 0 0 0 Yes Yes  Number falls in past yr: 0 0 0 1 2 or more  Injury with Fall? 0 0 0 Yes No  Risk for fall due to : Impaired balance/gait - - - -  Follow up - - - - Falls prevention discussed    FALL RISK PREVENTION PERTAINING TO THE HOME:  Any stairs in or around the home? No  If so, are there any without handrails? N/A  Home free of loose throw rugs in walkways, pet beds, electrical cords, etc? Yes  Adequate lighting in your home to reduce risk of falls? Yes   ASSISTIVE DEVICES UTILIZED TO PREVENT FALLS:  Life alert? No  Use of a cane, walker or w/c? No  Grab bars in the bathroom? Yes    Shower chair  or bench in shower? No  Elevated toilet seat or a handicapped toilet? No   TIMED UP AND GO:  Was the test performed? No .    Depression Screen PHQ 2/9 Scores 05/15/2019 05/15/2019 05/13/2019 04/18/2018  PHQ - 2 Score 0 0 0 1  PHQ- 9 Score - - - -    Cognitive Function: Declined today.         Immunization History  Administered Date(s) Administered  . Fluad Quad(high Dose 65+) 03/06/2019  . Influenza, High Dose Seasonal PF 02/17/2015, 01/11/2017, 02/06/2018  . Influenza-Unspecified 01/29/2016  . Pneumococcal Conjugate-13 05/11/2017  . Pneumococcal Polysaccharide-23 05/29/2007  . Td 03/20/2002, 02/19/2008, 10/26/2017  . Zoster 02/09/2010    Qualifies for Shingles Vaccine? Yes  Zostavax completed 02/09/10. Due for Shingrix. Pt has been advised to call insurance company to determine out of pocket expense. Advised may also receive vaccine at local pharmacy or Health Dept. Verbalized acceptance and understanding.  Tdap: Up to date  Flu Vaccine: Up to date  Pneumococcal Vaccine: Completed series  Screening Tests Health Maintenance  Topic Date Due  . TETANUS/TDAP  10/27/2027  . INFLUENZA VACCINE  Completed  . PNA vac Low Risk Adult  Completed   Cancer Screenings:  Colorectal Screening: No longer required.   Lung Cancer Screening: (Low Dose CT Chest recommended if Age 71-80 years, 30 pack-year currently smoking OR have quit w/in 15years.) does not qualify.   Additional Screening:  Vision Screening: Recommended annual ophthalmology exams for early detection of glaucoma and other disorders of the eye.  Dental Screening: Recommended annual dental exams for proper oral hygiene  Community Resource Referral:  CRR required this visit?  No        Plan:  I have personally reviewed and addressed the Medicare Annual Wellness questionnaire and have noted the following in the patient's chart:  A. Medical and social history B. Use of alcohol, tobacco or illicit  drugs  C. Current medications and supplements D. Functional ability and status E.  Nutritional status F.  Physical activity G. Advance directives H. List of other physicians I.  Hospitalizations, surgeries, and ER visits in previous 12 months J.  [place] such as hearing and vision if needed, cognitive and depression L. Referrals and appointments   In addition, I have reviewed and discussed with patient certain preventive protocols, quality metrics, and best practice recommendations. A written personalized care plan for preventive services as well as general preventive health recommendations were provided to patient.   [place] Score, [place]  [id number] Nurse Health Advisor   Nurse Notes: None.

## 2019-05-15 ENCOUNTER — Ambulatory Visit (INDEPENDENT_AMBULATORY_CARE_PROVIDER_SITE_OTHER): Payer: Medicare Other

## 2019-05-15 ENCOUNTER — Other Ambulatory Visit: Payer: Self-pay

## 2019-05-15 DIAGNOSIS — Z0000 Encounter for general adult medical examination without abnormal findings: Secondary | ICD-10-CM

## 2019-05-15 NOTE — Patient Instructions (Addendum)
[name] , Thank you for taking time to come for your Medicare Wellness Visit. I appreciate your ongoing commitment to your health goals. Please review the following plan we discussed and let me know if I can assist you in the future.   Screening recommendations/referrals: Colonoscopy: No longer required.  Recommended yearly ophthalmology/optometry visit for glaucoma screening and checkup Recommended yearly dental visit for hygiene and checkup  Vaccinations: Influenza vaccine: Up to date Pneumococcal vaccine: Completed series Tdap vaccine: Up to date, due 10/2027 Shingles vaccine: Pt declines today.     Advanced directives: Please bring a copy of your POA (Power of Attorney) and/or Living Will to your next appointment.   Conditions/risks identified: Recommend increasing water intake to 6-8 8 oz glasses a day.   Next appointment: 05/19/20 @ 11:20 for an AWV. Declined scheduling a follow up with PCP at this time.   Preventive Care 65 Years and Older, Male Preventive care refers to lifestyle choices and visits with your health care provider that can promote health and wellness. What does preventive care include?  A yearly physical exam. This is also called an annual well check.  Dental exams once or twice a year.  Routine eye exams. Ask your health care provider how often you should have your eyes checked.  Personal lifestyle choices, including:  Daily care of your teeth and gums.  Regular physical activity.  Eating a healthy diet.  Avoiding tobacco and drug use.  Limiting alcohol use.  Practicing safe sex.  Taking low doses of aspirin every day.  Taking vitamin and mineral supplements as recommended by your health care provider. What happens during an annual well check? The services and screenings done by your health care provider during your annual well check will depend on your age, overall health, lifestyle risk factors, and family history of disease. Counseling    Your health care provider may ask you questions about your:  Alcohol use.  Tobacco use.  Drug use.  Emotional well-being.  Home and relationship well-being.  Sexual activity.  Eating habits.  History of falls.  Memory and ability to understand (cognition).  Work and work Statistician. Screening  You may have the following tests or measurements:  Height, weight, and BMI.  Blood pressure.  Lipid and cholesterol levels. These may be checked every 5 years, or more frequently if you are over 84 years old.  Skin check.  Lung cancer screening. You may have this screening every year starting at age 24 if you have a 30-pack-year history of smoking and currently smoke or have quit within the past 15 years.  Fecal occult blood test (FOBT) of the stool. You may have this test every year starting at age 16.  Flexible sigmoidoscopy or colonoscopy. You may have a sigmoidoscopy every 5 years or a colonoscopy every 10 years starting at age 21.  Prostate cancer screening. Recommendations will vary depending on your family history and other risks.  Hepatitis C blood test.  Hepatitis B blood test.  Sexually transmitted disease (STD) testing.  Diabetes screening. This is done by checking your blood sugar (glucose) after you have not eaten for a while (fasting). You may have this done every 1-3 years.  Abdominal aortic aneurysm (AAA) screening. You may need this if you are a current or former smoker.  Osteoporosis. You may be screened starting at age 52 if you are at high risk. Talk with your health care provider about your test results, treatment options, and if necessary, the need for  more tests. Vaccines  Your health care provider may recommend certain vaccines, such as:  Influenza vaccine. This is recommended every year.  Tetanus, diphtheria, and acellular pertussis (Tdap, Td) vaccine. You may need a Td booster every 10 years.  Zoster vaccine. You may need this after age  77.  Pneumococcal 13-valent conjugate (PCV13) vaccine. One dose is recommended after age 60.  Pneumococcal polysaccharide (PPSV23) vaccine. One dose is recommended after age 45. Talk to your health care provider about which screenings and vaccines you need and how often you need them. This information is not intended to replace advice given to you by your health care provider. Make sure you discuss any questions you have with your health care provider. Document Released: 05/15/2015 Document Revised: 01/06/2016 Document Reviewed: 02/17/2015 Elsevier Interactive Patient Education  2017 [employer] Prevention in the Home Falls can cause injuries. They can happen to people of all ages. There are many things you can do to make your home safe and to help prevent falls. What can I do on the outside of my home?  Regularly fix the edges of walkways and driveways and fix any cracks.  Remove anything that might make you trip as you walk through a door, such as a raised step or threshold.  Trim any bushes or trees on the path to your home.  Use bright outdoor lighting.  Clear any walking paths of anything that might make someone trip, such as rocks or tools.  Regularly check to see if handrails are loose or broken. Make sure that both sides of any steps have handrails.  Any raised decks and porches should have guardrails on the edges.  Have any leaves, snow, or ice cleared regularly.  Use sand or salt on walking paths during winter.  Clean up any spills in your garage right away. This includes oil or grease spills. What can I do in the bathroom?  Use night lights.  Install grab bars by the toilet and in the tub and shower. Do not use towel bars as grab bars.  Use non-skid mats or decals in the tub or shower.  If you need to sit down in the shower, use a plastic, non-slip stool.  Keep the floor dry. Clean up any water that spills on the floor as soon as it happens.  Remove  soap buildup in the tub or shower regularly.  Attach bath mats securely with double-sided non-slip rug tape.  Do not have throw rugs and other things on the floor that can make you trip. What can I do in the bedroom?  Use night lights.  Make sure that you have a light by your bed that is easy to reach.  Do not use any sheets or blankets that are too big for your bed. They should not hang down onto the floor.  Have a firm chair that has side arms. You can use this for support while you get dressed.  Do not have throw rugs and other things on the floor that can make you trip. What can I do in the kitchen?  Clean up any spills right away.  Avoid walking on wet floors.  Keep items that you use a lot in easy-to-reach places.  If you need to reach something above you, use a strong step stool that has a grab bar.  Keep electrical cords out of the way.  Do not use floor polish or wax that makes floors slippery. If you must use wax, use non-skid  floor wax.  Do not have throw rugs and other things on the floor that can make you trip. What can I do with my stairs?  Do not leave any items on the stairs.  Make sure that there are handrails on both sides of the stairs and use them. Fix handrails that are broken or loose. Make sure that handrails are as long as the stairways.  Check any carpeting to make sure that it is firmly attached to the stairs. Fix any carpet that is loose or worn.  Avoid having throw rugs at the top or bottom of the stairs. If you do have throw rugs, attach them to the floor with carpet tape.  Make sure that you have a light switch at the top of the stairs and the bottom of the stairs. If you do not have them, ask someone to add them for you. What else can I do to help prevent falls?  Wear shoes that:  Do not have high heels.  Have rubber bottoms.  Are comfortable and fit you well.  Are closed at the toe. Do not wear sandals.  If you use a  stepladder:  Make sure that it is fully opened. Do not climb a closed stepladder.  Make sure that both sides of the stepladder are locked into place.  Ask someone to hold it for you, if possible.  Clearly mark and make sure that you can see:  Any grab bars or handrails.  First and last steps.  Where the edge of each step is.  Use tools that help you move around (mobility aids) if they are needed. These include:  Canes.  Walkers.  Scooters.  Crutches.  Turn on the lights when you go into a dark area. Replace any light bulbs as soon as they burn out.  Set up your furniture so you have a clear path. Avoid moving your furniture around.  If any of your floors are uneven, fix them.  If there are any pets around you, be aware of where they are.  Review your medicines with your doctor. Some medicines can make you feel dizzy. This can increase your chance of falling. Ask your doctor what other things that you can do to help prevent falls. This information is not intended to replace advice given to you by your health care provider. Make sure you discuss any questions you have with your health care provider. Document Released: 02/12/2009 Document Revised: 09/24/2015 Document Reviewed: 05/23/2014 Elsevier Interactive Patient Education  2017 [employer].

## 2019-05-22 ENCOUNTER — Telehealth: Payer: Self-pay | Admitting: Family Medicine

## 2019-05-22 NOTE — Telephone Encounter (Signed)
Patient came by the office to say that [facility] needs prior authorization done STAT for Hydrocodone-Acet 7.5/325 mg.   He spoke to [facility] on 05/20/19 and they were to fax an prior auth form here but we have not received it.   He has 2-3 days of pills left.   [facility] #is [phone]

## 2019-05-22 NOTE — Telephone Encounter (Signed)
I called [employer] at the number listed below and was advised that Hydrocodone does not need a prior authorization as long as the qty amount doesn't exceed 180 tablets per 30 days. I called [place] to check if they needed any authorization from our office. Pharmacy states that patient needs to bring in a prescription in order for them to check on whether it goes through their system. Patient needs to bring the prescription that was given to him during his last office visit on 05/13/2019. Left message to call back. OK for [facility] to advise.

## 2019-05-23 ENCOUNTER — Other Ambulatory Visit: Payer: Self-pay

## 2019-05-23 ENCOUNTER — Ambulatory Visit (INDEPENDENT_AMBULATORY_CARE_PROVIDER_SITE_OTHER): Payer: Medicare Other | Admitting: *Deleted

## 2019-05-23 DIAGNOSIS — E291 Testicular hypofunction: Secondary | ICD-10-CM | POA: Diagnosis not present

## 2019-05-23 MED ORDER — TESTOSTERONE CYPIONATE 200 MG/ML IM SOLN
200.0000 mg | Freq: Once | INTRAMUSCULAR | Status: AC
  Administered 2019-05-23: 200 mg via INTRAMUSCULAR

## 2019-05-23 NOTE — Telephone Encounter (Signed)
Patient came by the office and I advised him as below. He states the medication itself does not need prior authorization, but changing it from a tier 3 to tier 2 needs authorization from Dr. Caryn Section. He says the copay is much higher with a tier 3 medication. Patient would like this change made asap. Dr. Caryn Section, do you have a form from [facility] about tier change?

## 2019-05-23 NOTE — Progress Notes (Signed)
Testosterone IM Injection  Due to Hypogonadism patient is present today for a Testosterone Injection.  Medication: Testosterone Cypionate Dose: 1 ml Location: right upper outer buttocks Lot: [id number] Exp:09/28/2020  Patient tolerated well, no complications were noted  Performed by: Verlene Mayer, CMA  Follow up: 5 Weeks-appointment scheduled

## 2019-05-24 NOTE — Telephone Encounter (Signed)
I called [employer] and requested a tier exception form to be faxed to our office. Awaiting fax.   Call Reference # [id number]

## 2019-05-26 ENCOUNTER — Other Ambulatory Visit: Payer: Self-pay | Admitting: Family Medicine

## 2019-05-26 DIAGNOSIS — R053 Chronic cough: Secondary | ICD-10-CM

## 2019-05-26 DIAGNOSIS — R05 Cough: Secondary | ICD-10-CM

## 2019-05-26 NOTE — Telephone Encounter (Signed)
Requested medication (s) are due for refill today: no  Requested medication (s) are on the active medication list: yes  Last refill:  04/19/19  Future visit scheduled: no  Notes to clinic:  medication not delegated to NT to refill   Requested Prescriptions  Pending Prescriptions Disp Refills   guaiFENesin-codeine 100-10 MG/5ML syrup [Pharmacy Med Name: guaiFENesin-CODEIN 100-10MG /5ML SOL] 120 mL 1    Sig: TAKE 5-10 MLS (1-2 TEASPOONFULS) BY MOUTH EVERY 6 HOURS AS NEEDED FOR COUGH      Off-Protocol Failed - 05/26/2019 12:33 PM      Failed - Medication not assigned to a protocol, review manually.      Passed - Valid encounter within last 12 months    Recent Outpatient Visits           1 week ago DDD (degenerative disc disease), lumbosacral   [facility] Birdie Sons, MD   1 month ago Acute non-recurrent pansinusitis   [place], [place]   2 months ago Rash of back   [facility] Birdie Sons, MD   11 months ago Loss of weight   [facility] Birdie Sons, MD   1 year ago Loss of weight   [facility] Birdie Sons, MD

## 2019-05-27 NOTE — Telephone Encounter (Signed)
Form completed, please complete pt demographics section and fax to [facility]

## 2019-05-27 NOTE — Telephone Encounter (Signed)
Demographics section completed and given to Nilda Simmer to fax.

## 2019-05-27 NOTE — Telephone Encounter (Signed)
Pt called to request this refill, please advise. He Is still waiting.

## 2019-05-28 ENCOUNTER — Inpatient Hospital Stay
Admission: EM | Admit: 2019-05-28 | Discharge: 2019-06-01 | DRG: 177 | Disposition: A | Discharge status: Home Health | Payer: Medicare Other | Attending: Internal Medicine | Admitting: Internal Medicine

## 2019-05-28 ENCOUNTER — Encounter: Payer: Self-pay | Admitting: Emergency Medicine

## 2019-05-28 ENCOUNTER — Emergency Department: Payer: Medicare Other

## 2019-05-28 ENCOUNTER — Other Ambulatory Visit: Payer: Self-pay

## 2019-05-28 DIAGNOSIS — I10 Essential (primary) hypertension: Secondary | ICD-10-CM | POA: Diagnosis present

## 2019-05-28 DIAGNOSIS — N400 Enlarged prostate without lower urinary tract symptoms: Secondary | ICD-10-CM | POA: Diagnosis present

## 2019-05-28 DIAGNOSIS — G4700 Insomnia, unspecified: Secondary | ICD-10-CM | POA: Diagnosis present

## 2019-05-28 DIAGNOSIS — D61818 Other pancytopenia: Secondary | ICD-10-CM | POA: Diagnosis not present

## 2019-05-28 DIAGNOSIS — Z7902 Long term (current) use of antithrombotics/antiplatelets: Secondary | ICD-10-CM | POA: Diagnosis not present

## 2019-05-28 DIAGNOSIS — Z888 Allergy status to other drugs, medicaments and biological substances status: Secondary | ICD-10-CM

## 2019-05-28 DIAGNOSIS — R17 Unspecified jaundice: Secondary | ICD-10-CM | POA: Diagnosis present

## 2019-05-28 DIAGNOSIS — R509 Fever, unspecified: Secondary | ICD-10-CM | POA: Diagnosis not present

## 2019-05-28 DIAGNOSIS — E785 Hyperlipidemia, unspecified: Secondary | ICD-10-CM | POA: Diagnosis present

## 2019-05-28 DIAGNOSIS — Z7401 Bed confinement status: Secondary | ICD-10-CM | POA: Diagnosis not present

## 2019-05-28 DIAGNOSIS — N179 Acute kidney failure, unspecified: Secondary | ICD-10-CM | POA: Diagnosis present

## 2019-05-28 DIAGNOSIS — Z79899 Other long term (current) drug therapy: Secondary | ICD-10-CM | POA: Diagnosis not present

## 2019-05-28 DIAGNOSIS — R110 Nausea: Secondary | ICD-10-CM | POA: Diagnosis not present

## 2019-05-28 DIAGNOSIS — R54 Age-related physical debility: Secondary | ICD-10-CM | POA: Diagnosis present

## 2019-05-28 DIAGNOSIS — R112 Nausea with vomiting, unspecified: Secondary | ICD-10-CM | POA: Diagnosis present

## 2019-05-28 DIAGNOSIS — I2510 Atherosclerotic heart disease of native coronary artery without angina pectoris: Secondary | ICD-10-CM | POA: Diagnosis present

## 2019-05-28 DIAGNOSIS — K219 Gastro-esophageal reflux disease without esophagitis: Secondary | ICD-10-CM | POA: Diagnosis present

## 2019-05-28 DIAGNOSIS — G5600 Carpal tunnel syndrome, unspecified upper limb: Secondary | ICD-10-CM | POA: Diagnosis present

## 2019-05-28 DIAGNOSIS — J1282 Pneumonia due to coronavirus disease 2019: Secondary | ICD-10-CM | POA: Diagnosis present

## 2019-05-28 DIAGNOSIS — Z87891 Personal history of nicotine dependence: Secondary | ICD-10-CM | POA: Diagnosis not present

## 2019-05-28 DIAGNOSIS — Z950 Presence of cardiac pacemaker: Secondary | ICD-10-CM

## 2019-05-28 DIAGNOSIS — J189 Pneumonia, unspecified organism: Secondary | ICD-10-CM | POA: Diagnosis not present

## 2019-05-28 DIAGNOSIS — U071 COVID-19: Secondary | ICD-10-CM | POA: Diagnosis present

## 2019-05-28 DIAGNOSIS — Z951 Presence of aortocoronary bypass graft: Secondary | ICD-10-CM | POA: Diagnosis not present

## 2019-05-28 DIAGNOSIS — R5381 Other malaise: Secondary | ICD-10-CM | POA: Diagnosis not present

## 2019-05-28 DIAGNOSIS — R05 Cough: Secondary | ICD-10-CM | POA: Diagnosis not present

## 2019-05-28 DIAGNOSIS — M2550 Pain in unspecified joint: Secondary | ICD-10-CM | POA: Diagnosis not present

## 2019-05-28 DIAGNOSIS — R001 Bradycardia, unspecified: Secondary | ICD-10-CM | POA: Diagnosis not present

## 2019-05-28 LAB — URINALYSIS, COMPLETE (UACMP) WITH MICROSCOPIC
Bacteria, UA: NONE SEEN
Bilirubin Urine: NEGATIVE
Glucose, UA: NEGATIVE mg/dL
Hgb urine dipstick: NEGATIVE
Ketones, ur: NEGATIVE mg/dL
Leukocytes,Ua: NEGATIVE
Nitrite: NEGATIVE
Protein, ur: 30 mg/dL — AB
Specific Gravity, Urine: 1.026 (ref 1.005–1.030)
pH: 5 (ref 5.0–8.0)

## 2019-05-28 LAB — CBC WITH DIFFERENTIAL/PLATELET
Abs Immature Granulocytes: 0.01 10*3/uL (ref 0.00–0.07)
Basophils Absolute: 0 10*3/uL (ref 0.0–0.1)
Basophils Relative: 0 %
Eosinophils Absolute: 0 10*3/uL (ref 0.0–0.5)
Eosinophils Relative: 0 %
HCT: 32.5 % — ABNORMAL LOW (ref 39.0–52.0)
Hemoglobin: 10.4 g/dL — ABNORMAL LOW (ref 13.0–17.0)
Immature Granulocytes: 0 %
Lymphocytes Relative: 9 %
Lymphs Abs: 0.3 10*3/uL — ABNORMAL LOW (ref 0.7–4.0)
MCH: 30.2 pg (ref 26.0–34.0)
MCHC: 32 g/dL (ref 30.0–36.0)
MCV: 94.5 fL (ref 80.0–100.0)
Monocytes Absolute: 0.3 10*3/uL (ref 0.1–1.0)
Monocytes Relative: 10 %
Neutro Abs: 2.5 10*3/uL (ref 1.7–7.7)
Neutrophils Relative %: 81 %
Platelets: 155 10*3/uL (ref 150–400)
RBC: 3.44 MIL/uL — ABNORMAL LOW (ref 4.22–5.81)
RDW: 13.7 % (ref 11.5–15.5)
WBC: 3.1 10*3/uL — ABNORMAL LOW (ref 4.0–10.5)
nRBC: 0 % (ref 0.0–0.2)

## 2019-05-28 LAB — TRIGLYCERIDES: Triglycerides: 77 mg/dL (ref <150)

## 2019-05-28 LAB — LACTIC ACID, PLASMA: Lactic Acid, Venous: 1 mmol/L (ref 0.5–1.9)

## 2019-05-28 LAB — COMPREHENSIVE METABOLIC PANEL
ALT: 13 U/L (ref 0–44)
AST: 23 U/L (ref 15–41)
Albumin: 3.5 g/dL (ref 3.5–5.0)
Alkaline Phosphatase: 70 U/L (ref 38–126)
Anion gap: 7 (ref 5–15)
BUN: 24 mg/dL — ABNORMAL HIGH (ref 8–23)
CO2: 25 mmol/L (ref 22–32)
Calcium: 8.6 mg/dL — ABNORMAL LOW (ref 8.9–10.3)
Chloride: 103 mmol/L (ref 98–111)
Creatinine, Ser: 1.47 mg/dL — ABNORMAL HIGH (ref 0.61–1.24)
GFR calc Af Amer: 50 mL/min — ABNORMAL LOW (ref >60)
GFR calc non Af Amer: 43 mL/min — ABNORMAL LOW (ref >60)
Glucose, Bld: 133 mg/dL — ABNORMAL HIGH (ref 70–99)
Potassium: 4.9 mmol/L (ref 3.5–5.1)
Sodium: 135 mmol/L (ref 135–145)
Total Bilirubin: 1.4 mg/dL — ABNORMAL HIGH (ref 0.3–1.2)
Total Protein: 6.8 g/dL (ref 6.5–8.1)

## 2019-05-28 LAB — CREATININE, SERUM
Creatinine, Ser: 1.36 mg/dL — ABNORMAL HIGH (ref 0.61–1.24)
GFR calc Af Amer: 55 mL/min — ABNORMAL LOW (ref >60)
GFR calc non Af Amer: 47 mL/min — ABNORMAL LOW (ref >60)

## 2019-05-28 LAB — PROCALCITONIN: Procalcitonin: <0.1 ng/mL

## 2019-05-28 LAB — CBC
HCT: 30.3 % — ABNORMAL LOW (ref 39.0–52.0)
Hemoglobin: 10 g/dL — ABNORMAL LOW (ref 13.0–17.0)
MCH: 31.1 pg (ref 26.0–34.0)
MCHC: 33 g/dL (ref 30.0–36.0)
MCV: 94.1 fL (ref 80.0–100.0)
Platelets: 160 10*3/uL (ref 150–400)
RBC: 3.22 MIL/uL — ABNORMAL LOW (ref 4.22–5.81)
RDW: 13.5 % (ref 11.5–15.5)
WBC: 3 10*3/uL — ABNORMAL LOW (ref 4.0–10.5)
nRBC: 0 % (ref 0.0–0.2)

## 2019-05-28 LAB — FERRITIN: Ferritin: 548 ng/mL — ABNORMAL HIGH (ref 24–336)

## 2019-05-28 LAB — FIBRIN DERIVATIVES D-DIMER (ARMC ONLY): Fibrin derivatives D-dimer (ARMC): 968.58 ng/mL (FEU) — ABNORMAL HIGH (ref 0.00–499.00)

## 2019-05-28 LAB — LACTATE DEHYDROGENASE: LDH: 197 U/L — ABNORMAL HIGH (ref 98–192)

## 2019-05-28 LAB — ABO/RH: ABO/RH(D): AB POS

## 2019-05-28 LAB — FIBRINOGEN: Fibrinogen: 534 mg/dL — ABNORMAL HIGH (ref 210–475)

## 2019-05-28 LAB — POC SARS CORONAVIRUS 2 AG: SARS Coronavirus 2 Ag: POSITIVE — AB

## 2019-05-28 MED ORDER — SODIUM CHLORIDE 0.9 % IV SOLN
1000.0000 mL | INTRAVENOUS | Status: DC
  Administered 2019-05-28 – 2019-05-29 (×2): 1000 mL via INTRAVENOUS

## 2019-05-28 MED ORDER — ACETAMINOPHEN 325 MG PO TABS: 650.0000 mg | ORAL_TABLET | Freq: Four times a day (QID) | ORAL | Status: DC | PRN

## 2019-05-28 MED ORDER — ONDANSETRON 4 MG PO TBDP
4.0000 mg | ORAL_TABLET | Freq: Once | ORAL | Status: AC
  Administered 2019-05-28: 4 mg via ORAL
  Filled 2019-05-28: qty 1

## 2019-05-28 MED ORDER — SODIUM CHLORIDE 0.9 % IV SOLN
500.0000 mg | INTRAVENOUS | Status: DC
  Administered 2019-05-29: 500 mg via INTRAVENOUS
  Filled 2019-05-28: qty 500

## 2019-05-28 MED ORDER — ENOXAPARIN SODIUM 30 MG/0.3ML ~~LOC~~ SOLN
30.0000 mg | SUBCUTANEOUS | Status: DC
  Administered 2019-05-28: 30 mg via SUBCUTANEOUS
  Filled 2019-05-28: qty 0.3

## 2019-05-28 MED ORDER — SODIUM CHLORIDE 0.9 % IV SOLN
1.0000 g | INTRAVENOUS | Status: DC
  Administered 2019-05-29: 1 g via INTRAVENOUS
  Filled 2019-05-28: qty 10

## 2019-05-28 MED ORDER — HYDROCOD POLST-CPM POLST ER 10-8 MG/5ML PO SUER: 5.0000 mL | Freq: Two times a day (BID) | ORAL | Status: DC | PRN

## 2019-05-28 MED ORDER — HYDROCODONE-ACETAMINOPHEN 5-325 MG PO TABS
1.0000 | ORAL_TABLET | ORAL | Status: DC | PRN
  Administered 2019-05-28 – 2019-06-01 (×10): 1 via ORAL
  Filled 2019-05-28 (×10): qty 1

## 2019-05-28 MED ORDER — ONDANSETRON HCL 4 MG/2ML IJ SOLN: 4.0000 mg | Freq: Four times a day (QID) | INTRAMUSCULAR | Status: DC | PRN

## 2019-05-28 MED ORDER — GUAIFENESIN-DM 100-10 MG/5ML PO SYRP
10.0000 mL | ORAL_SOLUTION | ORAL | Status: DC | PRN
  Filled 2019-05-28: qty 10

## 2019-05-28 MED ORDER — ZINC SULFATE 220 (50 ZN) MG PO CAPS
220.0000 mg | ORAL_CAPSULE | Freq: Every day | ORAL | Status: DC
  Administered 2019-05-28 – 2019-06-01 (×5): 220 mg via ORAL
  Filled 2019-05-28 (×5): qty 1

## 2019-05-28 MED ORDER — ONDANSETRON HCL 4 MG PO TABS: 4.0000 mg | ORAL_TABLET | Freq: Four times a day (QID) | ORAL | Status: DC | PRN

## 2019-05-28 MED ORDER — ASCORBIC ACID 500 MG PO TABS
500.0000 mg | ORAL_TABLET | Freq: Every day | ORAL | Status: DC
  Administered 2019-05-28 – 2019-06-01 (×5): 500 mg via ORAL
  Filled 2019-05-28 (×5): qty 1

## 2019-05-28 MED ORDER — SODIUM CHLORIDE 0.9 % IV SOLN: 200.0000 mg | Freq: Once | INTRAVENOUS | Status: DC

## 2019-05-28 MED ORDER — SODIUM CHLORIDE 0.9 % IV SOLN: 100.0000 mg | Freq: Every day | INTRAVENOUS | Status: DC

## 2019-05-28 MED ORDER — SODIUM CHLORIDE 0.9 % IV SOLN
200.0000 mg | Freq: Once | INTRAVENOUS | Status: AC
  Administered 2019-05-28: 200 mg via INTRAVENOUS
  Filled 2019-05-28: qty 200

## 2019-05-28 MED ORDER — SODIUM CHLORIDE 0.9 % IV SOLN
100.0000 mg | Freq: Every day | INTRAVENOUS | Status: AC
  Administered 2019-05-29 – 2019-06-01 (×4): 100 mg via INTRAVENOUS
  Filled 2019-05-28 (×4): qty 100

## 2019-05-28 MED ORDER — DEXAMETHASONE 4 MG PO TABS
6.0000 mg | ORAL_TABLET | ORAL | Status: DC
  Administered 2019-05-28 – 2019-05-31 (×4): 6 mg via ORAL
  Filled 2019-05-28 (×5): qty 1.5

## 2019-05-28 NOTE — ED Notes (Signed)
ED Provider at bedside. 

## 2019-05-28 NOTE — Progress Notes (Signed)
Remdesivir - Pharmacy Brief Note   O:  ALT:  13  CXR: evidence of infiltrate  SpO2: 99% on RA    A/P:  Remdesivir 200 mg IVPB once followed by 100 mg IVPB daily x 4 days.   Ivan Salazar D 05/28/2019 8:47 PM

## 2019-05-28 NOTE — ED Triage Notes (Signed)
Pt in via [facility] from home, reports fever, cough, and nausea x one day.  Vitals WDL, NAD noted at this time.

## 2019-05-28 NOTE — ED Notes (Signed)
MD states pt is safe to take lovenox despite being on plavix at home

## 2019-05-28 NOTE — H&P (Signed)
History and Physical   Ivan Salazar [id number] DOB: 1931/06/25 DOA: 05/28/2019  Referring MD/NP/PA: Dr. Quentin Cornwall PCP: Birdie Sons, MD   Outpatient Specialists: None  Patient coming from: Home  Chief Complaint: Fever and nausea  HPI: Ivan Salazar is a 86 y.o. male with medical history significant of coronary artery disease, degenerative disc disease, GERD, BPH, hypertension who presents to the ER complaining of fever and weakness that started 2 days ago.  Symptoms have gotten worse in the last 24 hours.  He could not do much of his regular activities.  The temperature was as high as 101.  Patient seen in the ER and evaluated.  Denied any Covid contacts.  Denied any diarrhea.  He did have nausea but no significant vomiting.  Patient's COVID-19 screen was positive and he was noted to have early pneumonia with bilateral infiltrates consistent with Covid pneumonia.  He is being admitted to the hospital for work-up.  He is currently not requiring any oxygen..  ED Course: Temperature 99.6 blood pressure 115/65 pulse 82 respirate of 16 oxygen sats 99% on room air.  White count of 3.0 hemoglobin 10.1 platelets 160.  Sodium 135 potassium 4.9 chloride 103 CO2 of 25 with BUN 24 creatinine 1.36 calcium 8.6.  Glucose is 133.  Fibrinogen 534 and fibrin derivatives 968.  LDH 197 ferritin 548 procalcitonin is less than 10.  CRP currently pending.  Chest x-ray shows posterior lower lobe infiltrate with probable small pleural effusions.  Patient is being admitted with early Covid pneumonia.  Review of Systems: As per HPI otherwise 10 point review of systems negative.    Past Medical History:  Diagnosis Date  . BPH (benign prostatic hyperplasia)   . CAD (coronary artery disease)   . Carpal tunnel syndrome   . DDD (degenerative disc disease), cervical   . Environmental and seasonal allergies   . GERD (gastroesophageal reflux disease)   . Glaucoma   . Impotence   . Nocturia   . Presence of  permanent cardiac pacemaker   . Weight loss     Past Surgical History:  Procedure Laterality Date  . APPENDECTOMY    . BALLOON DILATION N/A 11/05/2018   Procedure: BALLOON DILATION;  Surgeon: Manya Silvas, MD;  Location: [facility] ENDOSCOPY;  Service: Endoscopy;  Laterality: N/A;  . BILATERAL CARPAL TUNNEL RELEASE     05/12/2011, 06/09/2011  . CARDIAC CATHETERIZATION  2004  . coils in vessel     6 coils  . CORONARY ANGIOPLASTY     x 5  . CORONARY ARTERY BYPASS GRAFT  2007   5; [facility]  . CORONARY STENT PLACEMENT  2005  . ESOPHAGOGASTRODUODENOSCOPY (EGD) WITH PROPOFOL N/A 10/28/2015   Procedure: ESOPHAGOGASTRODUODENOSCOPY (EGD) WITH PROPOFOL;  Surgeon: Manya Silvas, MD;  Location: [facility] ENDOSCOPY;  Service: Endoscopy;  Laterality: N/A;  . ESOPHAGOGASTRODUODENOSCOPY (EGD) WITH PROPOFOL N/A 11/05/2018   Procedure: ESOPHAGOGASTRODUODENOSCOPY (EGD) WITH PROPOFOL;  Surgeon: Manya Silvas, MD;  Location: [facility] ENDOSCOPY;  Service: Endoscopy;  Laterality: N/A;  . KIDNEY STONE SURGERY    . PACEMAKER INSERTION N/A 07/13/2017   Procedure: INSERTION PACEMAKER;  Surgeon: Isaias Cowman, MD;  Location: [facility] ORS;  Service: Cardiovascular;  Laterality: N/A;  . SHOULDER ARTHROSCOPY W/ ROTATOR CUFF REPAIR Bilateral   . TONSILLECTOMY    . UPPER GI ENDOSCOPY  08/02/2010   Dr. Tedra Coupe, gastritis. H Pylori negative     reports that he quit smoking about 42 years ago. He has a 10.00 pack-year smoking history.  He has never used smokeless tobacco. He reports current alcohol use of about 7.0 - 14.0 standard drinks of alcohol per week. He reports that he does not use drugs.  Allergies  Allergen Reactions  . Celebrex [Celecoxib] Other (See Comments)    Foul taste in mouth   . Mirtazapine Other (See Comments)    Foul taste in mouth    Family History  Problem Relation Age of Onset  . Cirrhosis Father        of liver  . Cancer Daughter   . Kidney disease Neg Hx   . Prostate cancer Neg Hx   .  Kidney cancer Neg Hx   . Bladder Cancer Neg Hx      Prior to Admission medications   Medication Sig Start Date End Date Taking? Authorizing Provider  atorvastatin (LIPITOR) 80 MG tablet TAKE ONE TABLET BY MOUTH DAILY 01/02/19   Birdie Sons, MD  clopidogrel (PLAVIX) 75 MG tablet TAKE 1 TABLET BY MOUTH DAILY 08/12/18   Birdie Sons, MD  enalapril (VASOTEC) 5 MG tablet TAKE ONE TABLET BY MOUTH DAILY 05/20/18   Birdie Sons, MD  fluticasone [facility]) 50 MCG/ACT nasal spray SPRAY TWO SPRAYS IN EACH NOSTRIL ONCE DAILY 04/14/19   Birdie Sons, MD  furosemide (LASIX) 20 MG tablet Take 0.5 tablets (10 mg total) by mouth daily as needed for edema. Patient not taking: Reported on 05/13/2019 07/28/17   Birdie Sons, MD  guaiFENesin-codeine 100-10 MG/5ML syrup TAKE 5-10 MLS (1-2 TEASPOONFULS) BY MOUTH EVERY 6 HOURS AS NEEDED FOR COUGH 05/27/19   Birdie Sons, MD  HYDROcodone-acetaminophen [facility]) 7.5-325 MG tablet 1 tablet every 4-6 hours as needed 05/13/19   Birdie Sons, MD  hydrocortisone 2.5 % cream Apply topically 2 (two) times daily as needed. 11/03/17   Birdie Sons, MD  latanoprost (XALATAN) 0.005 % ophthalmic solution Place 1 drop into both eyes at bedtime.     [provider]  levocetirizine (XYZAL) 5 MG tablet Take 1 tablet (5 mg total) by mouth every evening. 03/06/19   Birdie Sons, MD  metoprolol succinate (TOPROL-XL) 25 MG 24 hr tablet TAKE ONE TABLET BY MOUTH DAILY 10/28/18   Birdie Sons, MD  mupirocin ointment Drue Stager) 2 %  05/28/18   [provider]  nitroGLYCERIN (NITROSTAT) 0.4 MG SL tablet Place 1 tablet (0.4 mg total) under the tongue every 5 (five) minutes as needed for chest pain. 06/05/15   Birdie Sons, MD  polyethylene glycol-electrolytes (GAVILYTE-N WITH FLAVOR PACK) 420 g solution GaviLyte-N 420 gram oral solution 05/24/18   [provider]  testosterone cypionate (DEPOTESTOSTERONE CYPIONATE) 200 MG/ML injection Inject  1 mL (200 mg total) into the muscle every 28 (twenty-eight) days. 03/19/19   McGowan, Hunt Oris, PA-C  zolpidem (AMBIEN) 10 MG tablet TAKE 1/2 TO 1 TABLET EVERY NIGHT AT BEDTIME 03/06/19   Birdie Sons, MD    Physical Exam: Vitals:   05/28/19 1803  BP: (!) 158/65  Pulse: 82  Resp: 16  Temp: 99.6 F (37.6 C)  TempSrc: Oral  SpO2: 99%  Weight: 63.5 kg  Height: 5\' 10"  (1.778 m)      Constitutional: Acutely ill looking, no acute distress Vitals:   05/28/19 1803  BP: (!) 158/65  Pulse: 82  Resp: 16  Temp: 99.6 F (37.6 C)  TempSrc: Oral  SpO2: 99%  Weight: 63.5 kg  Height: 5\' 10"  (1.778 m)   Eyes: PERRL, lids and conjunctivae normal  ENMT: Mucous membranes are dry. Posterior pharynx clear of any exudate or lesions.Normal dentition.  Neck: normal, supple, no masses, no thyromegaly Respiratory: clear to auscultation bilaterally, no wheezing, bilateral rhonchi and mild crackles normal respiratory effort. No accessory muscle use.  Cardiovascular: Regular rate and rhythm, no murmurs / rubs / gallops. No extremity edema. 2+ pedal pulses. No carotid bruits.  Abdomen: no tenderness, no masses palpated. No hepatosplenomegaly. Bowel sounds positive.  Musculoskeletal: no clubbing / cyanosis. No joint deformity upper and lower extremities. Good ROM, no contractures. Normal muscle tone.  Skin: no rashes, lesions, ulcers. No induration Neurologic: CN 2-12 grossly intact. Sensation intact, DTR normal. Strength 5/5 in all 4.  Psychiatric: Normal judgment and insight. Alert and oriented x 3. Normal mood.     Labs on Admission: I have personally reviewed following labs and imaging studies  CBC: Recent Labs  Lab 05/28/19 1804  WBC 3.1*  NEUTROABS 2.5  HGB 10.4*  HCT 32.5*  MCV 94.5  PLT 99991111   Basic Metabolic Panel: Recent Labs  Lab 05/28/19 1804  NA 135  K 4.9  CL 103  CO2 25  GLUCOSE 133*  BUN 24*  CREATININE 1.47*  CALCIUM 8.6*   GFR: Estimated Creatinine  Clearance: 33 mL/min (A) (by C-G formula based on SCr of 1.47 mg/dL (H)). Liver Function Tests: Recent Labs  Lab 05/28/19 1804  AST 23  ALT 13  ALKPHOS 70  BILITOT 1.4*  PROT 6.8  ALBUMIN 3.5   No results for input(s): LIPASE, AMYLASE in the last 168 hours. No results for input(s): AMMONIA in the last 168 hours. Coagulation Profile: No results for input(s): INR, PROTIME in the last 168 hours. Cardiac Enzymes: No results for input(s): CKTOTAL, CKMB, CKMBINDEX, TROPONINI in the last 168 hours. BNP (last 3 results) No results for input(s): PROBNP in the last 8760 hours. HbA1C: No results for input(s): HGBA1C in the last 72 hours. CBG: No results for input(s): GLUCAP in the last 168 hours. Lipid Profile: No results for input(s): CHOL, HDL, LDLCALC, TRIG, CHOLHDL, LDLDIRECT in the last 72 hours. Thyroid Function Tests: No results for input(s): TSH, T4TOTAL, FREET4, T3FREE, THYROIDAB in the last 72 hours. Anemia Panel: No results for input(s): VITAMINB12, FOLATE, FERRITIN, TIBC, IRON, RETICCTPCT in the last 72 hours. Urine analysis:    Component Value Date/Time   COLORURINE YELLOW (A) 05/28/2019 1816   APPEARANCEUR HAZY (A) 05/28/2019 1816   LABSPEC 1.026 05/28/2019 1816   PHURINE 5.0 05/28/2019 1816   GLUCOSEU NEGATIVE 05/28/2019 1816   HGBUR NEGATIVE 05/28/2019 1816   BILIRUBINUR NEGATIVE 05/28/2019 1816   KETONESUR NEGATIVE 05/28/2019 1816   PROTEINUR 30 (A) 05/28/2019 1816   NITRITE NEGATIVE 05/28/2019 1816   LEUKOCYTESUR NEGATIVE 05/28/2019 1816   Sepsis Labs: @LABRCNTIP (procalcitonin:4,lacticidven:4) )No results found for this or any previous visit (from the past 240 hour(s)).   Radiological Exams on Admission: DG Chest 2 View  Result Date: 05/28/2019 CLINICAL DATA:  Fever and cough EXAM: CHEST - 2 VIEW COMPARISON:  07/13/2017 FINDINGS: Post sternotomy changes. Left-sided pacing device as before. Coil superior to the aortic arch. Airspace disease over the posterior  lung base best seen on lateral view with probable small pleural effusions. IMPRESSION: Posterior lower lobe infiltrate, best seen on the lateral view. There are probable small pleural effusions. Electronically Signed   By: Donavan Foil M.D.   On: 05/28/2019 18:32      Assessment/Plan Principal Problem:   Pneumonia due to COVID-19 virus Active Problems:   HLD (hyperlipidemia)  CAD in native artery   Essential (primary) hypertension   Acid reflux     #1 pneumonia due to COVID-19: Patient has weakness and the fever.  Not hypoxic at this point but just weak and probably having GI symptoms from the COVID-19.  Will admit to the hospital for treatment.  Patient has declined to go to [facility].  We will try and and take care of patient locally.  Initiate dexamethasone on remdesivir.  Monitor his response.  Antibiotics as tolerated.  #2 nausea with occasional vomiting: Could be GI manifestation of COVID-19 infection.  Treat symptomatically.  #3 hypertension: Resume home regimen and monitor.  #4 hyperlipidemia: Continue with statin  #5 GERD: Continue with PPIs  #6 coronary artery disease: Stable.   DVT prophylaxis: Lovenox Code Status: Full code Family Communication: Discussed case with patient no family available Disposition Plan: Home Consults called: None Admission status: Inpatient  Severity of Illness: The appropriate patient status for this patient is INPATIENT. Inpatient status is judged to be reasonable and necessary in order to provide the required intensity of service to ensure the patient's safety. The patient's presenting symptoms, physical exam findings, and initial radiographic and laboratory data in the context of their chronic comorbidities is felt to place them at high risk for further clinical deterioration. Furthermore, it is not anticipated that the patient will be medically stable for discharge from the hospital within 2 midnights of admission. The  following factors support the patient status of inpatient.   " The patient's presenting symptoms include fever with nausea. " The worrisome physical exam findings include decreased air entry at the bases. " The initial radiographic and laboratory data are worrisome because of chest x-ray findings of pneumonia bilaterally. " The chronic co-morbidities include coronary artery disease.   * I certify that at the point of admission it is my clinical judgment that the patient will require inpatient hospital care spanning beyond 2 midnights from the point of admission due to high intensity of service, high risk for further deterioration and high frequency of surveillance required.Barbette Merino MD [facility] Pager [phone]  If 7PM-7AM, please contact night-coverage [email or web address] Password [facility]  05/28/2019, 8:56 PM

## 2019-05-28 NOTE — ED Notes (Signed)
This RN was prepared to give lovenox injection and pt informed me that he takes plavix. Will inform MD and hold lovenox for now.

## 2019-05-28 NOTE — ED Provider Notes (Signed)
[facility] Emergency Department Provider Note    First MD Initiated Contact with Patient 05/28/19 1958     (approximate)  I have reviewed the triage vital signs and the nursing notes.   HISTORY  Chief Complaint Fever and Nausea    HPI Ivan Salazar is a 86 y.o. male below listed past medical history presents to the ER for shortness of breath nausea and vomiting.  States he started having fevers measured up to 100.7 this morning.  Feels weak and frail.  Lives at home alone.  Feels that the symptoms have rapidly progressed over the past 24 hours.  No recent antibiotics.  Does not wear home oxygen.  Does not smoke.    Past Medical History:  Diagnosis Date  . BPH (benign prostatic hyperplasia)   . CAD (coronary artery disease)   . Carpal tunnel syndrome   . DDD (degenerative disc disease), cervical   . Environmental and seasonal allergies   . GERD (gastroesophageal reflux disease)   . Glaucoma   . Impotence   . Nocturia   . Presence of permanent cardiac pacemaker   . Weight loss    Family History  Problem Relation Age of Onset  . Cirrhosis Father        of liver  . Cancer Daughter   . Kidney disease Neg Hx   . Prostate cancer Neg Hx   . Kidney cancer Neg Hx   . Bladder Cancer Neg Hx    Past Surgical History:  Procedure Laterality Date  . APPENDECTOMY    . BALLOON DILATION N/A 11/05/2018   Procedure: BALLOON DILATION;  Surgeon: Manya Silvas, MD;  Location: [facility] ENDOSCOPY;  Service: Endoscopy;  Laterality: N/A;  . BILATERAL CARPAL TUNNEL RELEASE     05/12/2011, 06/09/2011  . CARDIAC CATHETERIZATION  2004  . coils in vessel     6 coils  . CORONARY ANGIOPLASTY     x 5  . CORONARY ARTERY BYPASS GRAFT  2007   5; [facility]  . CORONARY STENT PLACEMENT  2005  . ESOPHAGOGASTRODUODENOSCOPY (EGD) WITH PROPOFOL N/A 10/28/2015   Procedure: ESOPHAGOGASTRODUODENOSCOPY (EGD) WITH PROPOFOL;  Surgeon: Manya Silvas, MD;  Location: [facility] ENDOSCOPY;   Service: Endoscopy;  Laterality: N/A;  . ESOPHAGOGASTRODUODENOSCOPY (EGD) WITH PROPOFOL N/A 11/05/2018   Procedure: ESOPHAGOGASTRODUODENOSCOPY (EGD) WITH PROPOFOL;  Surgeon: Manya Silvas, MD;  Location: [facility] ENDOSCOPY;  Service: Endoscopy;  Laterality: N/A;  . KIDNEY STONE SURGERY    . PACEMAKER INSERTION N/A 07/13/2017   Procedure: INSERTION PACEMAKER;  Surgeon: Isaias Cowman, MD;  Location: [facility] ORS;  Service: Cardiovascular;  Laterality: N/A;  . SHOULDER ARTHROSCOPY W/ ROTATOR CUFF REPAIR Bilateral   . TONSILLECTOMY    . UPPER GI ENDOSCOPY  08/02/2010   Dr. Tedra Coupe, gastritis. H Pylori negative   Patient Active Problem List   Diagnosis Date Noted  . Stiffness of shoulder joint 03/14/2019  . Status post cardiac pacemaker procedure 07/13/2017  . Mitral insufficiency 06/17/2017  . OA (osteoarthritis) of neck 11/01/2016  . Eustachian tube dysfunction 09/03/2015  . Schatzki's ring 09/03/2015  . Adenomatous polyp of colon 09/03/2015  . Hypogonadism in male 03/09/2015  . BPH with obstruction/lower urinary tract symptoms 03/09/2015  . Loss of weight 03/05/2015  . Actinic keratoses 10/22/2014  . DDD (degenerative disc disease), lumbosacral 10/22/2014  . Acid reflux 10/22/2014  . Testicular hypofunction 06/27/2012  . Bursitis of hip 01/18/2011  . Family history of colonic polyps 12/24/2007  . CAD in native  artery 08/16/2006  . H/O coronary artery bypass surgery 08/16/2006  . Anemia, iron deficiency 09/23/2005  . Cardiac enlargement 07/20/2005  . HLD (hyperlipidemia) 05/02/1998  . Insomnia, persistent 05/02/1998  . Essential (primary) hypertension 05/02/1998      Prior to Admission medications   Medication Sig Start Date End Date Taking? Authorizing Provider  atorvastatin (LIPITOR) 80 MG tablet TAKE ONE TABLET BY MOUTH DAILY 01/02/19   Birdie Sons, MD  clopidogrel (PLAVIX) 75 MG tablet TAKE 1 TABLET BY MOUTH DAILY 08/12/18   Birdie Sons, MD  enalapril (VASOTEC) 5 MG  tablet TAKE ONE TABLET BY MOUTH DAILY 05/20/18   Birdie Sons, MD  fluticasone [facility]) 50 MCG/ACT nasal spray SPRAY TWO SPRAYS IN EACH NOSTRIL ONCE DAILY 04/14/19   Birdie Sons, MD  furosemide (LASIX) 20 MG tablet Take 0.5 tablets (10 mg total) by mouth daily as needed for edema. Patient not taking: Reported on 05/13/2019 07/28/17   Birdie Sons, MD  guaiFENesin-codeine 100-10 MG/5ML syrup TAKE 5-10 MLS (1-2 TEASPOONFULS) BY MOUTH EVERY 6 HOURS AS NEEDED FOR COUGH 05/27/19   Birdie Sons, MD  HYDROcodone-acetaminophen [facility]) 7.5-325 MG tablet 1 tablet every 4-6 hours as needed 05/13/19   Birdie Sons, MD  hydrocortisone 2.5 % cream Apply topically 2 (two) times daily as needed. 11/03/17   Birdie Sons, MD  latanoprost (XALATAN) 0.005 % ophthalmic solution Place 1 drop into both eyes at bedtime.     [provider]  levocetirizine (XYZAL) 5 MG tablet Take 1 tablet (5 mg total) by mouth every evening. 03/06/19   Birdie Sons, MD  metoprolol succinate (TOPROL-XL) 25 MG 24 hr tablet TAKE ONE TABLET BY MOUTH DAILY 10/28/18   Birdie Sons, MD  mupirocin ointment Drue Stager) 2 %  05/28/18   [provider]  nitroGLYCERIN (NITROSTAT) 0.4 MG SL tablet Place 1 tablet (0.4 mg total) under the tongue every 5 (five) minutes as needed for chest pain. 06/05/15   Birdie Sons, MD  polyethylene glycol-electrolytes (GAVILYTE-N WITH FLAVOR PACK) 420 g solution GaviLyte-N 420 gram oral solution 05/24/18   [provider]  testosterone cypionate (DEPOTESTOSTERONE CYPIONATE) 200 MG/ML injection Inject 1 mL (200 mg total) into the muscle every 28 (twenty-eight) days. 03/19/19   McGowan, Larene Beach A, PA-C  zolpidem (AMBIEN) 10 MG tablet TAKE 1/2 TO 1 TABLET EVERY NIGHT AT BEDTIME 03/06/19   Birdie Sons, MD    Allergies Celebrex [celecoxib] and Mirtazapine    Social History Social History   Tobacco Use  . Smoking status: Former Smoker    Packs/day: 0.50     Years: 20.00    Pack years: 10.00    Quit date: 05/01/1977    Years since quitting: 42.1  . Smokeless tobacco: Never Used  Substance Use Topics  . Alcohol use: Yes    Alcohol/week: 7.0 - 14.0 standard drinks    Types: 7 - 14 Shots of liquor per week    Comment: moderate; 1-2 drinks daily  . Drug use: No    Review of Systems Patient denies headaches, rhinorrhea, blurry vision, numbness, shortness of breath, chest pain, edema, cough, abdominal pain, nausea, vomiting, diarrhea, dysuria, fevers, rashes or hallucinations unless otherwise stated above in HPI. ____________________________________________   PHYSICAL EXAM:  VITAL SIGNS: Vitals:   05/28/19 1803  BP: (!) 158/65  Pulse: 82  Resp: 16  Temp: 99.6 F (37.6 C)  SpO2: 99%    Constitutional: Alert and oriented.  Eyes: Conjunctivae  are normal.  Head: Atraumatic. Nose: No congestion/rhinnorhea. Mouth/Throat: Mucous membranes are moist.   Neck: No stridor. Painless ROM.  Cardiovascular: Normal rate, regular rhythm. Grossly normal heart sounds.  Good peripheral circulation. Respiratory: Normal respiratory effort.  No retractions. Lungs with posterior inspiratory crackles Gastrointestinal: Soft and nontender. No distention. No abdominal bruits. No CVA tenderness. Genitourinary:  Musculoskeletal: No lower extremity tenderness nor edema.  No joint effusions. Neurologic:  Normal speech and language. No gross focal neurologic deficits are appreciated. No facial droop Skin:  Skin is warm, dry and intact. No rash noted. Psychiatric: Mood and affect are normal. Speech and behavior are normal.  ____________________________________________   LABS (all labs ordered are listed, but only abnormal results are displayed)  Results for orders placed or performed during the hospital encounter of 05/28/19 (from the past 24 hour(s))  Comprehensive metabolic panel     Status: Abnormal   Collection Time: 05/28/19  6:04 PM  Result Value Ref  Range   Sodium 135 135 - 145 mmol/L   Potassium 4.9 3.5 - 5.1 mmol/L   Chloride 103 98 - 111 mmol/L   CO2 25 22 - 32 mmol/L   Glucose, Bld 133 (H) 70 - 99 mg/dL   BUN 24 (H) 8 - 23 mg/dL   Creatinine, Ser 1.47 (H) 0.61 - 1.24 mg/dL   Calcium 8.6 (L) 8.9 - 10.3 mg/dL   Total Protein 6.8 6.5 - 8.1 g/dL   Albumin 3.5 3.5 - 5.0 g/dL   AST 23 15 - 41 U/L   ALT 13 0 - 44 U/L   Alkaline Phosphatase 70 38 - 126 U/L   Total Bilirubin 1.4 (H) 0.3 - 1.2 mg/dL   GFR calc non Af Amer 43 (L) >60 mL/min   GFR calc Af Amer 50 (L) >60 mL/min   Anion gap 7 5 - 15  CBC with Differential     Status: Abnormal   Collection Time: 05/28/19  6:04 PM  Result Value Ref Range   WBC 3.1 (L) 4.0 - 10.5 K/uL   RBC 3.44 (L) 4.22 - 5.81 MIL/uL   Hemoglobin 10.4 (L) 13.0 - 17.0 g/dL   HCT 32.5 (L) 39.0 - 52.0 %   MCV 94.5 80.0 - 100.0 fL   MCH 30.2 26.0 - 34.0 pg   MCHC 32.0 30.0 - 36.0 g/dL   RDW 13.7 11.5 - 15.5 %   Platelets 155 150 - 400 K/uL   nRBC 0.0 0.0 - 0.2 %   Neutrophils Relative % 81 %   Neutro Abs 2.5 1.7 - 7.7 K/uL   Lymphocytes Relative 9 %   Lymphs Abs 0.3 (L) 0.7 - 4.0 K/uL   Monocytes Relative 10 %   Monocytes Absolute 0.3 0.1 - 1.0 K/uL   Eosinophils Relative 0 %   Eosinophils Absolute 0.0 0.0 - 0.5 K/uL   Basophils Relative 0 %   Basophils Absolute 0.0 0.0 - 0.1 K/uL   Immature Granulocytes 0 %   Abs Immature Granulocytes 0.01 0.00 - 0.07 K/uL  Urinalysis, Complete w Microscopic     Status: Abnormal   Collection Time: 05/28/19  6:16 PM  Result Value Ref Range   Color, Urine YELLOW (A) YELLOW   APPearance HAZY (A) CLEAR   Specific Gravity, Urine 1.026 1.005 - 1.030   pH 5.0 5.0 - 8.0   Glucose, UA NEGATIVE NEGATIVE mg/dL   Hgb urine dipstick NEGATIVE NEGATIVE   Bilirubin Urine NEGATIVE NEGATIVE   Ketones, ur NEGATIVE NEGATIVE mg/dL  Protein, ur 30 (A) NEGATIVE mg/dL   Nitrite NEGATIVE NEGATIVE   Leukocytes,Ua NEGATIVE NEGATIVE   RBC / HPF 0-5 0 - 5 RBC/hpf   WBC, UA 0-5 0  - 5 WBC/hpf   Bacteria, UA NONE SEEN NONE SEEN   Squamous Epithelial / LPF 0-5 0 - 5   Mucus PRESENT    Hyaline Casts, UA PRESENT   POC SARS Coronavirus 2 Ag     Status: Abnormal   Collection Time: 05/28/19  8:28 PM  Result Value Ref Range   SARS Coronavirus 2 Ag POSITIVE (A) NEGATIVE   ____________________________________________  _________________________________  RADIOLOGY  I personally reviewed all radiographic images ordered to evaluate for the above acute complaints and reviewed radiology reports and findings.  These findings were personally discussed with the patient.  Please see medical record for radiology report.  ____________________________________________   PROCEDURES  Procedure(s) performed:  Procedures    Critical Care performed: no ____________________________________________   INITIAL IMPRESSION / ASSESSMENT AND PLAN / ED COURSE  Pertinent labs & imaging results that were available during my care of the patient were reviewed by me and considered in my medical decision making (see chart for details).   DDX: pna, covid, enteritis, sbo, gastritis, dehydration, electroylte abn, sepsis  Ivan Salazar is a 86 y.o. who presents to the ED with symptoms as described above.  Patient protecting his airway he is frail and weak appearing but nontoxic.  Chest x-ray does show evidence of infiltrate.  Work-up concerning for Covid.  Her rapid antigen is Covid positive.  Given the rapid progression of his symptoms do feel he should be observed in the hospital.  We will start remdesivir given the infiltrate and symptoms and risk factors.  We discussed case with hospitalist for admission.     The patient was evaluated in Emergency Department today for the symptoms described in the history of present illness. He/she was evaluated in the context of the global COVID-19 pandemic, which necessitated consideration that the patient might be at risk for infection with the SARS-CoV-2  virus that causes COVID-19. Institutional protocols and algorithms that pertain to the evaluation of patients at risk for COVID-19 are in a state of rapid change based on information released by regulatory bodies including the CDC and federal and state organizations. These policies and algorithms were followed during the patient's care in the ED.  As part of my medical decision making, I reviewed the following data within the [place] notes reviewed and incorporated, Labs reviewed, notes from prior ED visits and [place] Controlled Substance Database   ____________________________________________   FINAL CLINICAL IMPRESSION(S) / ED DIAGNOSES  Final diagnoses:  Community acquired pneumonia of left lower lobe of lung  COVID-19 virus infection      NEW MEDICATIONS STARTED DURING THIS VISIT:  New Prescriptions   No medications on file     Note:  This document was prepared using Dragon voice recognition software and may include unintentional dictation errors.    Merlyn Lot, MD 05/28/19 2042

## 2019-05-29 LAB — COMPREHENSIVE METABOLIC PANEL
ALT: 12 U/L (ref 0–44)
AST: 21 U/L (ref 15–41)
Albumin: 2.8 g/dL — ABNORMAL LOW (ref 3.5–5.0)
Alkaline Phosphatase: 55 U/L (ref 38–126)
Anion gap: 8 (ref 5–15)
BUN: 24 mg/dL — ABNORMAL HIGH (ref 8–23)
CO2: 22 mmol/L (ref 22–32)
Calcium: 7.9 mg/dL — ABNORMAL LOW (ref 8.9–10.3)
Chloride: 109 mmol/L (ref 98–111)
Creatinine, Ser: 1.19 mg/dL (ref 0.61–1.24)
GFR calc Af Amer: >60 mL/min (ref >60)
GFR calc non Af Amer: 55 mL/min — ABNORMAL LOW (ref >60)
Glucose, Bld: 128 mg/dL — ABNORMAL HIGH (ref 70–99)
Potassium: 4.4 mmol/L (ref 3.5–5.1)
Sodium: 139 mmol/L (ref 135–145)
Total Bilirubin: 1.3 mg/dL — ABNORMAL HIGH (ref 0.3–1.2)
Total Protein: 6.1 g/dL — ABNORMAL LOW (ref 6.5–8.1)

## 2019-05-29 LAB — CBC WITH DIFFERENTIAL/PLATELET
Abs Immature Granulocytes: 0.01 10*3/uL (ref 0.00–0.07)
Basophils Absolute: 0 10*3/uL (ref 0.0–0.1)
Basophils Relative: 0 %
Eosinophils Absolute: 0 10*3/uL (ref 0.0–0.5)
Eosinophils Relative: 0 %
HCT: 30.1 % — ABNORMAL LOW (ref 39.0–52.0)
Hemoglobin: 9.9 g/dL — ABNORMAL LOW (ref 13.0–17.0)
Immature Granulocytes: 1 %
Lymphocytes Relative: 13 %
Lymphs Abs: 0.2 10*3/uL — ABNORMAL LOW (ref 0.7–4.0)
MCH: 31.2 pg (ref 26.0–34.0)
MCHC: 32.9 g/dL (ref 30.0–36.0)
MCV: 95 fL (ref 80.0–100.0)
Monocytes Absolute: 0.1 10*3/uL (ref 0.1–1.0)
Monocytes Relative: 6 %
Neutro Abs: 1.3 10*3/uL — ABNORMAL LOW (ref 1.7–7.7)
Neutrophils Relative %: 80 %
Platelets: 149 10*3/uL — ABNORMAL LOW (ref 150–400)
RBC: 3.17 MIL/uL — ABNORMAL LOW (ref 4.22–5.81)
RDW: 13.4 % (ref 11.5–15.5)
WBC: 1.6 10*3/uL — ABNORMAL LOW (ref 4.0–10.5)
nRBC: 0 % (ref 0.0–0.2)

## 2019-05-29 LAB — C-REACTIVE PROTEIN
CRP: 11.5 mg/dL — ABNORMAL HIGH (ref <1.0)
CRP: 11.8 mg/dL — ABNORMAL HIGH (ref <1.0)

## 2019-05-29 LAB — FERRITIN: Ferritin: 535 ng/mL — ABNORMAL HIGH (ref 24–336)

## 2019-05-29 LAB — FIBRIN DERIVATIVES D-DIMER (ARMC ONLY): Fibrin derivatives D-dimer (ARMC): 985.78 ng/mL (FEU) — ABNORMAL HIGH (ref 0.00–499.00)

## 2019-05-29 LAB — MAGNESIUM: Magnesium: 1.8 mg/dL (ref 1.7–2.4)

## 2019-05-29 LAB — PHOSPHORUS: Phosphorus: 3.2 mg/dL (ref 2.5–4.6)

## 2019-05-29 MED ORDER — CLOPIDOGREL BISULFATE 75 MG PO TABS
75.0000 mg | ORAL_TABLET | Freq: Every day | ORAL | Status: DC
  Administered 2019-05-29 – 2019-06-01 (×4): 75 mg via ORAL
  Filled 2019-05-29 (×4): qty 1

## 2019-05-29 MED ORDER — ATORVASTATIN CALCIUM 80 MG PO TABS
80.0000 mg | ORAL_TABLET | Freq: Every day | ORAL | Status: DC
  Administered 2019-05-29 – 2019-05-31 (×3): 80 mg via ORAL
  Filled 2019-05-29 (×3): qty 1

## 2019-05-29 MED ORDER — ALBUTEROL SULFATE HFA 108 (90 BASE) MCG/ACT IN AERS
2.0000 | INHALATION_SPRAY | RESPIRATORY_TRACT | Status: DC | PRN
  Filled 2019-05-29: qty 6.7

## 2019-05-29 MED ORDER — ENOXAPARIN SODIUM 40 MG/0.4ML ~~LOC~~ SOLN
40.0000 mg | SUBCUTANEOUS | Status: DC
  Administered 2019-05-29: 40 mg via SUBCUTANEOUS
  Filled 2019-05-29: qty 0.4

## 2019-05-29 MED ORDER — METOPROLOL SUCCINATE ER 25 MG PO TB24
25.0000 mg | ORAL_TABLET | Freq: Every day | ORAL | Status: DC
  Administered 2019-05-29 – 2019-06-01 (×4): 25 mg via ORAL
  Filled 2019-05-29 (×4): qty 1

## 2019-05-29 MED ORDER — ENALAPRIL MALEATE 5 MG PO TABS
5.0000 mg | ORAL_TABLET | Freq: Every day | ORAL | Status: DC
  Administered 2019-05-29 – 2019-06-01 (×4): 5 mg via ORAL
  Filled 2019-05-29 (×5): qty 1

## 2019-05-29 MED ORDER — SODIUM CHLORIDE 0.9 % IV SOLN
1000.0000 mL | INTRAVENOUS | Status: DC
  Administered 2019-05-29 – 2019-06-01 (×5): 1000 mL via INTRAVENOUS

## 2019-05-29 NOTE — Progress Notes (Signed)
Report given to 2A RN. Will transport patient to room.

## 2019-05-29 NOTE — Progress Notes (Signed)
Anticoagulation monitoring(Lovenox):  86 yo male ordered Lovenox 30 mg Q24h  Filed Weights   05/28/19 1803  Weight: 140 lb (63.5 kg)   Body mass index is 20.09 kg/m.   Lab Results  Component Value Date   CREATININE 1.36 (H) 05/28/2019   CREATININE 1.47 (H) 05/28/2019   CREATININE 1.39 (H) 04/27/2018   Estimated Creatinine Clearance: 35.7 mL/min (A) (by C-G formula based on SCr of 1.36 mg/dL (H)). Hemoglobin & Hematocrit     Component Value Date/Time   HGB 9.9 (L) 05/29/2019 0630   HGB 12.0 (L) 11/29/2018 1122   HCT 30.1 (L) 05/29/2019 0630   HCT 34.3 (L) 02/28/2019 1031     Per Protocol for Patient with estCrcl > 30 ml/min and BMI < 40, will transition to Lovenox 40 mg Q24h.

## 2019-05-29 NOTE — Progress Notes (Addendum)
PROGRESS NOTE    Ivan Salazar  [id number] DOB: 1932/04/18 DOA: 05/28/2019 PCP: Birdie Sons, MD      Assessment & Plan:   Principal Problem:   Pneumonia due to COVID-19 virus Active Problems:   HLD (hyperlipidemia)   CAD in native artery   Essential (primary) hypertension   Acid reflux   COVID-19 pneumonia: continue on remdesivir, steroids, zinc & vitamin c. Not currently requiring supplemental oxygen. Will d/c abxs as pro-cal is WNL. Zofran prn for nausea/vomiting  Pancytopenia: etiology unclear, possibly secondary to [place]. No need for PRBCs or platelet transfusions at this time. Will continue to monitor   AKI: baseline Cr is unknown. Cr is trending down today. Will continue to monitor   Hyperbilirubinemia: etiology unclear, but trending down. Will continue to monitor   HTN: will continue on home dose of metoprolol, enalapril   HLD: continue with statin  CAD: continue on home dose of metoprolol, enalapril, plavix & statin. Stable.  Generalized weakness: PT/OT consulted    DVT prophylaxis: lovenox Code Status: full  Family Communication: called pt's son, Stihl Brusso, to discuss pt's care and answered all of his questions  Disposition Plan:    Consultants:    Procedures:   Antimicrobials:    Subjective: Pt c/o weakness  Objective: Vitals:   05/28/19 2330 05/29/19 0600 05/29/19 0730 05/29/19 0745  BP: (!) 143/63     Pulse: 63 (!) 59 64 61  Resp:      Temp:  99.5 F (37.5 C)    TempSrc:  Oral    SpO2: 99% 100% 100% 100%  Weight:      Height:       No intake or output data in the 24 hours ending 05/29/19 0845 Filed Weights   05/28/19 1803  Weight: 63.5 kg    Examination:  General exam: Appears calm and comfortable. Frail appearing  Respiratory system: diminished breath sounds b/l.  Intermittent wheezes  Cardiovascular system: S1 & S2 +. No rubs, gallops or clicks. Gastrointestinal system: Abdomen is nondistended, soft and  nontender.  Normal bowel sounds heard. Central nervous system: Alert and oriented. Moves all 4 extremities  Psychiatry: Judgement and insight appear normal. Mood & affect appropriate.     Data Reviewed: I have personally reviewed following labs and imaging studies  CBC: Recent Labs  Lab 05/28/19 1804 05/28/19 2059 05/29/19 0630  WBC 3.1* 3.0* 1.6*  NEUTROABS 2.5  --  1.3*  HGB 10.4* 10.0* 9.9*  HCT 32.5* 30.3* 30.1*  MCV 94.5 94.1 95.0  PLT 155 160 [id number]*   Basic Metabolic Panel: Recent Labs  Lab 05/28/19 1804 05/28/19 2059 05/29/19 0630  NA 135  --  139  K 4.9  --  4.4  CL 103  --  109  CO2 25  --  22  GLUCOSE 133*  --  128*  BUN 24*  --  24*  CREATININE 1.47* 1.36* 1.19  CALCIUM 8.6*  --  7.9*  MG  --   --  1.8  PHOS  --   --  3.2   GFR: Estimated Creatinine Clearance: 40.8 mL/min (by C-G formula based on SCr of 1.19 mg/dL). Liver Function Tests: Recent Labs  Lab 05/28/19 1804 05/29/19 0630  AST 23 21  ALT 13 12  ALKPHOS 70 55  BILITOT 1.4* 1.3*  PROT 6.8 6.1*  ALBUMIN 3.5 2.8*   No results for input(s): LIPASE, AMYLASE in the last 168 hours. No results for input(s): AMMONIA in the last 168  hours. Coagulation Profile: No results for input(s): INR, PROTIME in the last 168 hours. Cardiac Enzymes: No results for input(s): CKTOTAL, CKMB, CKMBINDEX, TROPONINI in the last 168 hours. BNP (last 3 results) No results for input(s): PROBNP in the last 8760 hours. HbA1C: No results for input(s): HGBA1C in the last 72 hours. CBG: No results for input(s): GLUCAP in the last 168 hours. Lipid Profile: Recent Labs    05/28/19 2034  TRIG 77   Thyroid Function Tests: No results for input(s): TSH, T4TOTAL, FREET4, T3FREE, THYROIDAB in the last 72 hours. Anemia Panel: Recent Labs    05/28/19 2034 05/29/19 0630  FERRITIN 548* 535*   Sepsis Labs: Recent Labs  Lab 05/28/19 2034 05/28/19 2100  PROCALCITON <0.10  --   LATICACIDVEN  --  1.0    Recent  Results (from the past 240 hour(s))  Blood Culture (routine x 2)     Status: None (Preliminary result)   Collection Time: 05/28/19  8:34 PM   Specimen: BLOOD  Result Value Ref Range Status   Specimen Description BLOOD  Final   Special Requests NONE  Final   Culture   Final    NO GROWTH < 12 HOURS Performed at [facility], [address], [place], [place] [address]    Report Status PENDING  Incomplete  Blood Culture (routine x 2)     Status: None (Preliminary result)   Collection Time: 05/28/19  8:39 PM   Specimen: BLOOD  Result Value Ref Range Status   Specimen Description BLOOD  Final   Special Requests NONE  Final   Culture   Final    NO GROWTH < 12 HOURS Performed at [facility], [address], [place], [place] [address]    Report Status PENDING  Incomplete         Radiology Studies: DG Chest 2 View  Result Date: 05/28/2019 CLINICAL DATA:  Fever and cough EXAM: CHEST - 2 VIEW COMPARISON:  07/13/2017 FINDINGS: Post sternotomy changes. Left-sided pacing device as before. Coil superior to the aortic arch. Airspace disease over the posterior lung base best seen on lateral view with probable small pleural effusions. IMPRESSION: Posterior lower lobe infiltrate, best seen on the lateral view. There are probable small pleural effusions. Electronically Signed   By: Donavan Foil M.D.   On: 05/28/2019 18:32        Scheduled Meds: . vitamin C  500 mg Oral Daily  . dexamethasone  6 mg Oral Q24H  . enoxaparin (LOVENOX) injection  40 mg Subcutaneous Q24H  . zinc sulfate  220 mg Oral Daily   Continuous Infusions: . sodium chloride 1,000 mL (05/29/19 0725)  . azithromycin Stopped (05/29/19 0440)  . cefTRIAXone (ROCEPHIN)  IV Stopped (05/29/19 0340)  . remdesivir 100 mg in NS 100 mL       LOS: 1 day    Time spent: 30 mins    Wyvonnia Dusky, MD [facility] Pager [phone]  If 7PM-7AM, please contact  night-coverage [email or web address] Password [facility] 05/29/2019, 8:45 AM

## 2019-05-29 NOTE — Plan of Care (Signed)
Educate patient on Covid-19

## 2019-05-30 LAB — COMPREHENSIVE METABOLIC PANEL
ALT: 14 U/L (ref 0–44)
AST: 29 U/L (ref 15–41)
Albumin: 2.9 g/dL — ABNORMAL LOW (ref 3.5–5.0)
Alkaline Phosphatase: 51 U/L (ref 38–126)
Anion gap: 8 (ref 5–15)
BUN: 29 mg/dL — ABNORMAL HIGH (ref 8–23)
CO2: 25 mmol/L (ref 22–32)
Calcium: 8.2 mg/dL — ABNORMAL LOW (ref 8.9–10.3)
Chloride: 105 mmol/L (ref 98–111)
Creatinine, Ser: 1.13 mg/dL (ref 0.61–1.24)
GFR calc Af Amer: >60 mL/min (ref >60)
GFR calc non Af Amer: 59 mL/min — ABNORMAL LOW (ref >60)
Glucose, Bld: 133 mg/dL — ABNORMAL HIGH (ref 70–99)
Potassium: 4.4 mmol/L (ref 3.5–5.1)
Sodium: 138 mmol/L (ref 135–145)
Total Bilirubin: 1 mg/dL (ref 0.3–1.2)
Total Protein: 5.6 g/dL — ABNORMAL LOW (ref 6.5–8.1)

## 2019-05-30 LAB — CBC WITH DIFFERENTIAL/PLATELET
Abs Immature Granulocytes: 0.01 10*3/uL (ref 0.00–0.07)
Basophils Absolute: 0 10*3/uL (ref 0.0–0.1)
Basophils Relative: 0 %
Eosinophils Absolute: 0 10*3/uL (ref 0.0–0.5)
Eosinophils Relative: 0 %
HCT: 32.2 % — ABNORMAL LOW (ref 39.0–52.0)
Hemoglobin: 10.4 g/dL — ABNORMAL LOW (ref 13.0–17.0)
Immature Granulocytes: 0 %
Lymphocytes Relative: 10 %
Lymphs Abs: 0.2 10*3/uL — ABNORMAL LOW (ref 0.7–4.0)
MCH: 30.4 pg (ref 26.0–34.0)
MCHC: 32.3 g/dL (ref 30.0–36.0)
MCV: 94.2 fL (ref 80.0–100.0)
Monocytes Absolute: 0.2 10*3/uL (ref 0.1–1.0)
Monocytes Relative: 7 %
Neutro Abs: 1.9 10*3/uL (ref 1.7–7.7)
Neutrophils Relative %: 83 %
Platelets: 177 10*3/uL (ref 150–400)
RBC: 3.42 MIL/uL — ABNORMAL LOW (ref 4.22–5.81)
RDW: 13.3 % (ref 11.5–15.5)
WBC: 2.3 10*3/uL — ABNORMAL LOW (ref 4.0–10.5)
nRBC: 0 % (ref 0.0–0.2)

## 2019-05-30 LAB — MAGNESIUM: Magnesium: 1.8 mg/dL (ref 1.7–2.4)

## 2019-05-30 LAB — PHOSPHORUS: Phosphorus: 3 mg/dL (ref 2.5–4.6)

## 2019-05-30 LAB — FIBRIN DERIVATIVES D-DIMER (ARMC ONLY): Fibrin derivatives D-dimer (ARMC): 956.05 ng/mL (FEU) — ABNORMAL HIGH (ref 0.00–499.00)

## 2019-05-30 LAB — C-REACTIVE PROTEIN: CRP: 8 mg/dL — ABNORMAL HIGH (ref <1.0)

## 2019-05-30 LAB — FERRITIN: Ferritin: 696 ng/mL — ABNORMAL HIGH (ref 24–336)

## 2019-05-30 MED ORDER — ZOLPIDEM TARTRATE 5 MG PO TABS
5.0000 mg | ORAL_TABLET | Freq: Every day | ORAL | Status: DC
  Administered 2019-05-30 – 2019-05-31 (×3): 5 mg via ORAL
  Filled 2019-05-30 (×3): qty 1

## 2019-05-30 MED ORDER — PNEUMOCOCCAL VAC POLYVALENT 25 MCG/0.5ML IJ INJ: 0.5000 mL | INJECTION | INTRAMUSCULAR | Status: DC

## 2019-05-30 MED ORDER — LATANOPROST 0.005 % OP SOLN
1.0000 [drp] | Freq: Every day | OPHTHALMIC | Status: DC
  Administered 2019-05-30 – 2019-05-31 (×2): 1 [drp] via OPHTHALMIC
  Filled 2019-05-30 (×2): qty 2.5

## 2019-05-30 MED ORDER — ENOXAPARIN SODIUM 40 MG/0.4ML ~~LOC~~ SOLN
40.0000 mg | SUBCUTANEOUS | Status: DC
  Administered 2019-05-31: 40 mg via SUBCUTANEOUS
  Filled 2019-05-30 (×2): qty 0.4

## 2019-05-30 MED ORDER — ENSURE ENLIVE PO LIQD
237.0000 mL | Freq: Three times a day (TID) | ORAL | Status: DC
  Administered 2019-05-30 – 2019-06-01 (×5): 237 mL via ORAL

## 2019-05-30 NOTE — Progress Notes (Signed)
OT Cancellation Note  Patient Details Name: Ivan Salazar MRN: [id number] DOB: 03/10/1932   Cancelled Treatment:    Reason Eval/Treat Not Completed: OT screened, no needs identified, will sign off. Thank you for the OT consult. Order received and chart reviewed. This [place] spoke with pt primary physical therapist who saw pt for evaluation this date. Per physical therapist, pt is ambulating at supervision level without an assisted device and appears back to baseline level of functional independence for ADL management. No skilled OT needs identified. Will sign off at this time.   Shara Blazing, M.S., OTR/L Ascom: [phone] 05/30/19, 11:48 AM

## 2019-05-30 NOTE — Evaluation (Signed)
Physical Therapy Evaluation Patient Details Name: Ivan Salazar MRN: [id number] DOB: [birth year] Today's Date: 05/30/2019   History of Present Illness  presented to ER secondary to fever, nausea, progressive weakness; admitted for management of PNA related to COVID-19.  Currently on RA.  Clinical Impression  Upon evaluation, patient alert and oriented; follows commands and demonstrates good effort with mobility tasks.  Baseline R UE RTC deficits noted; otherwise, UE/LE strength and ROM grossly symmetrical and WFL for basic transfers and mobility.  Able to complete sit/stand, basic transfers and gait (80') without assist device, sup.  Demonstrates reciprocal stepping pattern with mild decrease in trunk rotation and arm sway (R > L), but fair step height/length and overall cadence. No buckling or LOB; no SOB appreciated.  Eager to participate and maintain mobility.  Will continue to assess/progress as appropriate. Would benefit from skilled PT to address above deficits and promote optimal return to PLOF.;d Recommend transition to HHPT upon discharge from acute hospitalization.     Follow Up Recommendations Home health PT    Equipment Recommendations  (has access to RW if needed)    Recommendations for Other Services       Precautions / Restrictions Precautions Precautions: Fall Restrictions Weight Bearing Restrictions: No      Mobility  Bed Mobility               General bed mobility comments: received after toileting; in recliner end of session  Transfers Overall transfer level: Needs assistance Equipment used: None Transfers: Sit to/from Stand Sit to Stand: Supervision            Ambulation/Gait Ambulation/Gait assistance: Supervision Gait Distance (Feet): 80 Feet Assistive device: None       General Gait Details: reciprocal stepping pattern with mild decrease in trunk rotation and arm sway (R > L), but fair step height/length and overall cadence. No buckling  or LOB; no SOB appreciated.  Stairs            Wheelchair Mobility    Modified Rankin (Stroke Patients Only)       Balance Overall balance assessment: Needs assistance Sitting-balance support: No upper extremity supported;Feet supported Sitting balance-Leahy Scale: Good     Standing balance support: No upper extremity supported Standing balance-Leahy Scale: Fair                               Pertinent Vitals/Pain Pain Assessment: No/denies pain    Home Living Family/patient expects to be discharged to:: Private residence Living Arrangements: Alone   Type of Home: Apartment Home Access: Level entry     Home Layout: One level Home Equipment: Walker - 2 wheels      Prior Function Level of Independence: Independent         Comments: Indep with ADLs, household and community mobilization without assist device; has access to RW if needed. + driving; no falls; no home O2.  Walks at mall (at least 1 mile) 3x/week.     Hand Dominance   Dominant Hand: Right    Extremity/Trunk Assessment   Upper Extremity Assessment Upper Extremity Assessment: (baseline R UE RTC deficits; otherwise, bilat UEs grossly WFL)    Lower Extremity Assessment Lower Extremity Assessment: Overall WFL for tasks assessed(grossly at least 4/5 throughout)       Communication   Communication: HOH  Cognition Arousal/Alertness: Awake/alert Behavior During Therapy: WFL for tasks assessed/performed Overall Cognitive Status: Within Functional Limits for tasks assessed  General Comments      Exercises Other Exercises Other Exercises: Toilet transfer, ambulatory without assist device, sup; sit/stand from standard toilet height, sup/mod indep. Standing balance at sink for hand hygiene, sup; fair/good awareness of safety needs and limits of stability.   Assessment/Plan    PT Assessment Patient needs continued PT services   PT Problem List Decreased activity tolerance;Decreased balance;Decreased mobility;Cardiopulmonary status limiting activity       PT Treatment Interventions DME instruction;Gait training;Functional mobility training;Therapeutic activities;Therapeutic exercise;Balance training;Patient/family education    PT Goals (Current goals can be found in the Care Plan section)  Acute Rehab PT Goals Patient Stated Goal: to return home PT Goal Formulation: With patient Time For Goal Achievement: 06/13/19 Potential to Achieve Goals: Good    Frequency Min 2X/week   Barriers to discharge        Co-evaluation               AM-PAC PT "6 Clicks" Mobility  Outcome Measure Help needed turning from your back to your side while in a flat bed without using bedrails?: None Help needed moving from lying on your back to sitting on the side of a flat bed without using bedrails?: None Help needed moving to and from a bed to a chair (including a wheelchair)?: None Help needed standing up from a chair using your arms (e.g., wheelchair or bedside chair)?: None Help needed to walk in hospital room?: None Help needed climbing 3-5 steps with a railing? : A Little 6 Click Score: 23    End of Session   Activity Tolerance: Patient tolerated treatment well Patient left: in chair;with call bell/phone within reach;with chair alarm set Nurse Communication: Mobility status PT Visit Diagnosis: Difficulty in walking, not elsewhere classified (R26.2);Muscle weakness (generalized) (M62.81)    Time: 1032-1100 PT Time Calculation (min) (ACUTE ONLY): 28 min   Charges:   PT Evaluation $PT Eval Moderate Complexity: 1 Mod PT Treatments $Therapeutic Activity: 8-22 mins        Bayard More H. Owens Shark, PT, DPT, NCS 05/30/19, 11:18 AM [phone]

## 2019-05-30 NOTE — Progress Notes (Deleted)
Omit this note

## 2019-05-30 NOTE — Progress Notes (Addendum)
PROGRESS NOTE    Ivan Salazar  [id number] DOB: 11/10/1931 DOA: 05/28/2019 PCP: Birdie Sons, MD      Assessment & Plan:   Principal Problem:   Pneumonia due to COVID-19 virus Active Problems:   HLD (hyperlipidemia)   CAD in native artery   Essential (primary) hypertension   Acid reflux   COVID-19 pneumonia: continue on remdesivir, steroids, zinc & vitamin c. Not currently requiring supplemental oxygen. Will d/c abxs as pro-cal is WNL. Zofran prn for nausea/vomiting  Bicytopenia: etiology unclear, possibly secondary to [place]. No need for PRBCs or platelet transfusions at this time. Will continue to monitor   AKI: baseline Cr is unknown. Cr continues to trend down. Will continue to monitor   Hyperbilirubinemia: resolved  HTN: will continue on home dose of metoprolol, enalapril   HLD: continue with statin  CAD: continue on home dose of metoprolol, enalapril, plavix & statin. Stable.  Generalized weakness: PT is recommending home health. Home health orders will be placed    DVT prophylaxis: lovenox Code Status: full  Family Communication: called pt's son, Westyn Skousen, but not home currently so I will call back in about 30 mins Disposition Plan: can likely d/c tomorrow if pt continues to clinically improve    Consultants:    Procedures:   Antimicrobials:    Subjective: Pt c/o fatigue  Objective: Vitals:   05/30/19 0452 05/30/19 0744 05/30/19 0947 05/30/19 1503  BP: 122/70 (!) 144/75  (!) 161/80  Pulse: 63 66  65  Resp: 18 19  19   Temp: 98.3 F (36.8 C) 97.8 F (36.6 C)  97.9 F (36.6 C)  TempSrc:      SpO2: 99% 100%  100%  Weight: 46.5 kg  64.5 kg   Height:        Intake/Output Summary (Last 24 hours) at 05/30/2019 1517 Last data filed at 05/30/2019 1503 Gross per 24 hour  Intake 227.45 ml  Output 1905 ml  Net -1677.55 ml   Filed Weights   05/29/19 0954 05/30/19 0452 05/30/19 0947  Weight: 64.8 kg 46.5 kg 64.5 kg     Examination:  General exam: Appears calm and comfortable. Frail appearing  Respiratory system: decreased breath sounds b/l.  No rales  Cardiovascular system: S1 & S2 +. No rubs, gallops or clicks. Gastrointestinal system: Abdomen is nondistended, soft and nontender.  Normal bowel sounds heard. Central nervous system: Alert and oriented. Moves all 4 extremities  Psychiatry: Judgement and insight appear normal. Flat mood and affect.     Data Reviewed: I have personally reviewed following labs and imaging studies  CBC: Recent Labs  Lab 05/28/19 1804 05/28/19 2059 05/29/19 0630 05/30/19 0406  WBC 3.1* 3.0* 1.6* 2.3*  NEUTROABS 2.5  --  1.3* 1.9  HGB 10.4* 10.0* 9.9* 10.4*  HCT 32.5* 30.3* 30.1* 32.2*  MCV 94.5 94.1 95.0 94.2  PLT 155 160 149* [id number]   Basic Metabolic Panel: Recent Labs  Lab 05/28/19 1804 05/28/19 2059 05/29/19 0630 05/30/19 0406  NA 135  --  139 138  K 4.9  --  4.4 4.4  CL 103  --  109 105  CO2 25  --  22 25  GLUCOSE 133*  --  128* 133*  BUN 24*  --  24* 29*  CREATININE 1.47* 1.36* 1.19 1.13  CALCIUM 8.6*  --  7.9* 8.2*  MG  --   --  1.8 1.8  PHOS  --   --  3.2 3.0   GFR: Estimated Creatinine  Clearance: 43.6 mL/min (by C-G formula based on SCr of 1.13 mg/dL). Liver Function Tests: Recent Labs  Lab 05/28/19 1804 05/29/19 0630 05/30/19 0406  AST 23 21 29   ALT 13 12 14   ALKPHOS 70 55 51  BILITOT 1.4* 1.3* 1.0  PROT 6.8 6.1* 5.6*  ALBUMIN 3.5 2.8* 2.9*   No results for input(s): LIPASE, AMYLASE in the last 168 hours. No results for input(s): AMMONIA in the last 168 hours. Coagulation Profile: No results for input(s): INR, PROTIME in the last 168 hours. Cardiac Enzymes: No results for input(s): CKTOTAL, CKMB, CKMBINDEX, TROPONINI in the last 168 hours. BNP (last 3 results) No results for input(s): PROBNP in the last 8760 hours. HbA1C: No results for input(s): HGBA1C in the last 72 hours. CBG: No results for input(s): GLUCAP in the last  168 hours. Lipid Profile: Recent Labs    05/28/19 2034  TRIG 77   Thyroid Function Tests: No results for input(s): TSH, T4TOTAL, FREET4, T3FREE, THYROIDAB in the last 72 hours. Anemia Panel: Recent Labs    05/29/19 0630 05/30/19 0406  FERRITIN 535* 696*   Sepsis Labs: Recent Labs  Lab 05/28/19 2034 05/28/19 2100  PROCALCITON <0.10  --   LATICACIDVEN  --  1.0    Recent Results (from the past 240 hour(s))  Blood Culture (routine x 2)     Status: None (Preliminary result)   Collection Time: 05/28/19  8:34 PM   Specimen: BLOOD  Result Value Ref Range Status   Specimen Description BLOOD  Final   Special Requests NONE  Final   Culture   Final    NO GROWTH 2 DAYS Performed at [facility], [address], [place], [place] [address]    Report Status PENDING  Incomplete  Blood Culture (routine x 2)     Status: None (Preliminary result)   Collection Time: 05/28/19  8:39 PM   Specimen: BLOOD  Result Value Ref Range Status   Specimen Description BLOOD  Final   Special Requests NONE  Final   Culture   Final    NO GROWTH 2 DAYS Performed at [facility], [address], [place], [place] [address]    Report Status PENDING  Incomplete         Radiology Studies: DG Chest 2 View  Result Date: 05/28/2019 CLINICAL DATA:  Fever and cough EXAM: CHEST - 2 VIEW COMPARISON:  07/13/2017 FINDINGS: Post sternotomy changes. Left-sided pacing device as before. Coil superior to the aortic arch. Airspace disease over the posterior lung base best seen on lateral view with probable small pleural effusions. IMPRESSION: Posterior lower lobe infiltrate, best seen on the lateral view. There are probable small pleural effusions. Electronically Signed   By: Donavan Foil M.D.   On: 05/28/2019 18:32        Scheduled Meds: . vitamin C  500 mg Oral Daily  . atorvastatin  80 mg Oral q1800  . clopidogrel  75 mg Oral Daily  . dexamethasone  6 mg Oral Q24H  . enalapril  5 mg  Oral Daily  . enoxaparin (LOVENOX) injection  40 mg Subcutaneous Q24H  . feeding supplement (ENSURE ENLIVE)  237 mL Oral TID BM  . latanoprost  1 drop Both Eyes QHS  . metoprolol succinate  25 mg Oral Daily  . zinc sulfate  220 mg Oral Daily  . zolpidem  5 mg Oral QHS   Continuous Infusions: . sodium chloride 1,000 mL (05/30/19 0905)  . remdesivir 100 mg in  NS 100 mL 100 mg (05/30/19 0906)     LOS: 2 days    Time spent: 30 mins    Wyvonnia Dusky, MD [facility] Pager [phone]  If 7PM-7AM, please contact night-coverage [email or web address] Password [facility] 05/30/2019, 3:17 PM

## 2019-05-30 NOTE — Telephone Encounter (Signed)
Pt calling to check on this. States he can be reached at [phone] for this week.

## 2019-05-31 LAB — CBC WITH DIFFERENTIAL/PLATELET
Abs Immature Granulocytes: 0.03 10*3/uL (ref 0.00–0.07)
Basophils Absolute: 0 10*3/uL (ref 0.0–0.1)
Basophils Relative: 0 %
Eosinophils Absolute: 0 10*3/uL (ref 0.0–0.5)
Eosinophils Relative: 0 %
HCT: 34.7 % — ABNORMAL LOW (ref 39.0–52.0)
Hemoglobin: 11.3 g/dL — ABNORMAL LOW (ref 13.0–17.0)
Immature Granulocytes: 1 %
Lymphocytes Relative: 6 %
Lymphs Abs: 0.3 10*3/uL — ABNORMAL LOW (ref 0.7–4.0)
MCH: 30.3 pg (ref 26.0–34.0)
MCHC: 32.6 g/dL (ref 30.0–36.0)
MCV: 93 fL (ref 80.0–100.0)
Monocytes Absolute: 0.2 10*3/uL (ref 0.1–1.0)
Monocytes Relative: 4 %
Neutro Abs: 5 10*3/uL (ref 1.7–7.7)
Neutrophils Relative %: 89 %
Platelets: 225 10*3/uL (ref 150–400)
RBC: 3.73 MIL/uL — ABNORMAL LOW (ref 4.22–5.81)
RDW: 13.6 % (ref 11.5–15.5)
WBC: 5.5 10*3/uL (ref 4.0–10.5)
nRBC: 0 % (ref 0.0–0.2)

## 2019-05-31 LAB — BASIC METABOLIC PANEL
Anion gap: 9 (ref 5–15)
BUN: 29 mg/dL — ABNORMAL HIGH (ref 8–23)
CO2: 23 mmol/L (ref 22–32)
Calcium: 8.3 mg/dL — ABNORMAL LOW (ref 8.9–10.3)
Chloride: 107 mmol/L (ref 98–111)
Creatinine, Ser: 0.98 mg/dL (ref 0.61–1.24)
GFR calc Af Amer: >60 mL/min (ref >60)
GFR calc non Af Amer: >60 mL/min (ref >60)
Glucose, Bld: 132 mg/dL — ABNORMAL HIGH (ref 70–99)
Potassium: 4.2 mmol/L (ref 3.5–5.1)
Sodium: 139 mmol/L (ref 135–145)

## 2019-05-31 LAB — FERRITIN: Ferritin: 676 ng/mL — ABNORMAL HIGH (ref 24–336)

## 2019-05-31 LAB — PHOSPHORUS: Phosphorus: 2.6 mg/dL (ref 2.5–4.6)

## 2019-05-31 LAB — C-REACTIVE PROTEIN: CRP: 4.3 mg/dL — ABNORMAL HIGH (ref <1.0)

## 2019-05-31 LAB — MAGNESIUM: Magnesium: 1.8 mg/dL (ref 1.7–2.4)

## 2019-05-31 LAB — FIBRIN DERIVATIVES D-DIMER (ARMC ONLY): Fibrin derivatives D-dimer (ARMC): 724.47 ng/mL (FEU) — ABNORMAL HIGH (ref 0.00–499.00)

## 2019-05-31 NOTE — Progress Notes (Signed)
  At approximately 0115 patient left room unannounced and wandered, pushing his IV pole, over to the next unit. Had to be escorted back to his room, although he could state his room # the patient could not answer orientation questions or explain why he had left his room. Staff who helped patient to use urinal later in the evening reported to this RN that patient was hallucinating and asking "why are the people over there watching me." Patient continues to endorse hearing and seeing things in the room.   Bed alarm in place, calming environment set up for patient, phone and call light within reach.

## 2019-05-31 NOTE — Telephone Encounter (Signed)
Tried calling pt to advised form has been completed and faxed. No answer and no vm.

## 2019-05-31 NOTE — Progress Notes (Signed)
PROGRESS NOTE    Ivan Salazar  [id number] DOB: 07-16-1931 DOA: 05/28/2019 PCP: Birdie Sons, MD      Assessment & Plan:   Principal Problem:   Pneumonia due to COVID-19 virus Active Problems:   HLD (hyperlipidemia)   CAD in native artery   Essential (primary) hypertension   Acid reflux   COVID-19 pneumonia: continue on remdesivir, steroids, zinc & vitamin c. Not currently requiring supplemental oxygen. Abxs as pro-cal is WNL. Zofran prn for nausea/vomiting  Normocytic: etiology unclear, possibly secondary to [place]. No need for a transfusions at this time. Will continue to monitor   AKI: baseline Cr is unknown. Resolved   Hyperbilirubinemia: resolved  HTN: will continue on home dose of metoprolol, enalapril   HLD: continue with statin  CAD: continue on home dose of metoprolol, enalapril, plavix & statin. Stable.  Generalized weakness: PT is recommending home health. Home health has been set up   DVT prophylaxis: lovenox Code Status: full  Family Communication: called pt's son, Kaedon Gladysz, discussed pt's care and answered all of his questions  Disposition Plan: an episode of "sleeping walking" as per pt overnight & confusion overnight as per pt's nurse so will keep pt another night and hopefully can d/c tomorrow     Consultants: n/a   Procedures:   Antimicrobials: n/a   Subjective: Pt c/o malaise  Objective: Vitals:   05/30/19 2021 05/30/19 2129 05/31/19 0312 05/31/19 0801  BP: (!) 170/74 (!) 156/73 (!) 148/71 (!) 145/69  Pulse: 70 60 64 65  Resp: 19  19 19   Temp: (!) 97.5 F (36.4 C)  (!) 97.5 F (36.4 C) 97.8 F (36.6 C)  TempSrc: Oral  Oral   SpO2: 99% 100% 100% 100%  Weight:      Height:        Intake/Output Summary (Last 24 hours) at 05/31/2019 1408 Last data filed at 05/31/2019 1236 Gross per 24 hour  Intake --  Output 1500 ml  Net -1500 ml   Filed Weights   05/29/19 0954 05/30/19 0452 05/30/19 0947  Weight: 64.8 kg  46.5 kg 64.5 kg    Examination:  General exam: Appears calm and comfortable. Frail appearing  Respiratory system: diminished breath sounds b/l.  No rales  Cardiovascular system: S1 & S2 +. No rubs, gallops or clicks. Gastrointestinal system: Abdomen is nondistended, soft and nontender.  Normal bowel sounds heard. Central nervous system: Alert and oriented. Moves all 4 extremities  Psychiatry: Judgement and insight appear normal. Flat mood and affect.     Data Reviewed: I have personally reviewed following labs and imaging studies  CBC: Recent Labs  Lab 05/28/19 1804 05/28/19 2059 05/29/19 0630 05/30/19 0406 05/31/19 0800  WBC 3.1* 3.0* 1.6* 2.3* 5.5  NEUTROABS 2.5  --  1.3* 1.9 5.0  HGB 10.4* 10.0* 9.9* 10.4* 11.3*  HCT 32.5* 30.3* 30.1* 32.2* 34.7*  MCV 94.5 94.1 95.0 94.2 93.0  PLT 155 160 149* 177 [id number]   Basic Metabolic Panel: Recent Labs  Lab 05/28/19 1804 05/28/19 2059 05/29/19 0630 05/30/19 0406 05/31/19 0800  NA 135  --  139 138 139  K 4.9  --  4.4 4.4 4.2  CL 103  --  109 105 107  CO2 25  --  22 25 23   GLUCOSE 133*  --  128* 133* 132*  BUN 24*  --  24* 29* 29*  CREATININE 1.47* 1.36* 1.19 1.13 0.98  CALCIUM 8.6*  --  7.9* 8.2* 8.3*  MG  --   --  1.8 1.8 1.8  PHOS  --   --  3.2 3.0 2.6   GFR: Estimated Creatinine Clearance: 50.3 mL/min (by C-G formula based on SCr of 0.98 mg/dL). Liver Function Tests: Recent Labs  Lab 05/28/19 1804 05/29/19 0630 05/30/19 0406  AST 23 21 29   ALT 13 12 14   ALKPHOS 70 55 51  BILITOT 1.4* 1.3* 1.0  PROT 6.8 6.1* 5.6*  ALBUMIN 3.5 2.8* 2.9*   No results for input(s): LIPASE, AMYLASE in the last 168 hours. No results for input(s): AMMONIA in the last 168 hours. Coagulation Profile: No results for input(s): INR, PROTIME in the last 168 hours. Cardiac Enzymes: No results for input(s): CKTOTAL, CKMB, CKMBINDEX, TROPONINI in the last 168 hours. BNP (last 3 results) No results for input(s): PROBNP in the last 8760  hours. HbA1C: No results for input(s): HGBA1C in the last 72 hours. CBG: No results for input(s): GLUCAP in the last 168 hours. Lipid Profile: Recent Labs    05/28/19 2034  TRIG 77   Thyroid Function Tests: No results for input(s): TSH, T4TOTAL, FREET4, T3FREE, THYROIDAB in the last 72 hours. Anemia Panel: Recent Labs    05/30/19 0406 05/31/19 0800  FERRITIN 696* 676*   Sepsis Labs: Recent Labs  Lab 05/28/19 2034 05/28/19 2100  PROCALCITON <0.10  --   LATICACIDVEN  --  1.0    Recent Results (from the past 240 hour(s))  Blood Culture (routine x 2)     Status: None (Preliminary result)   Collection Time: 05/28/19  8:34 PM   Specimen: BLOOD  Result Value Ref Range Status   Specimen Description BLOOD  Final   Special Requests NONE  Final   Culture   Final    NO GROWTH 3 DAYS Performed at [facility], [address], [place], [place] [address]    Report Status PENDING  Incomplete  Blood Culture (routine x 2)     Status: None (Preliminary result)   Collection Time: 05/28/19  8:39 PM   Specimen: BLOOD  Result Value Ref Range Status   Specimen Description BLOOD  Final   Special Requests NONE  Final   Culture   Final    NO GROWTH 3 DAYS Performed at [facility], [address] [place], [place] [address]    Report Status PENDING  Incomplete         Radiology Studies: No results found.      Scheduled Meds: . vitamin C  500 mg Oral Daily  . atorvastatin  80 mg Oral q1800  . clopidogrel  75 mg Oral Daily  . dexamethasone  6 mg Oral Q24H  . enalapril  5 mg Oral Daily  . enoxaparin (LOVENOX) injection  40 mg Subcutaneous Q24H  . feeding supplement (ENSURE ENLIVE)  237 mL Oral TID BM  . latanoprost  1 drop Both Eyes QHS  . metoprolol succinate  25 mg Oral Daily  . pneumococcal 23 valent vaccine  0.5 mL Intramuscular Tomorrow-1000  . zinc sulfate  220 mg Oral Daily  . zolpidem  5 mg Oral QHS   Continuous Infusions: . sodium  chloride 1,000 mL (05/31/19 1401)  . remdesivir 100 mg in NS 100 mL 100 mg (05/31/19 0849)     LOS: 3 days    Time spent: 30 mins    Wyvonnia Dusky, MD [facility] Pager [phone]  If 7PM-7AM, please contact night-coverage [email or web address] 05/31/2019, 2:08 PM

## 2019-06-01 LAB — FERRITIN: Ferritin: 684 ng/mL — ABNORMAL HIGH (ref 24–336)

## 2019-06-01 LAB — BASIC METABOLIC PANEL
Anion gap: 9 (ref 5–15)
BUN: 28 mg/dL — ABNORMAL HIGH (ref 8–23)
CO2: 22 mmol/L (ref 22–32)
Calcium: 8.4 mg/dL — ABNORMAL LOW (ref 8.9–10.3)
Chloride: 109 mmol/L (ref 98–111)
Creatinine, Ser: 0.95 mg/dL (ref 0.61–1.24)
GFR calc Af Amer: >60 mL/min (ref >60)
GFR calc non Af Amer: >60 mL/min (ref >60)
Glucose, Bld: 130 mg/dL — ABNORMAL HIGH (ref 70–99)
Potassium: 4.5 mmol/L (ref 3.5–5.1)
Sodium: 140 mmol/L (ref 135–145)

## 2019-06-01 LAB — PHOSPHORUS: Phosphorus: 2.7 mg/dL (ref 2.5–4.6)

## 2019-06-01 LAB — CBC WITH DIFFERENTIAL/PLATELET
Abs Immature Granulocytes: 0.08 10*3/uL — ABNORMAL HIGH (ref 0.00–0.07)
Basophils Absolute: 0 10*3/uL (ref 0.0–0.1)
Basophils Relative: 0 %
Eosinophils Absolute: 0 10*3/uL (ref 0.0–0.5)
Eosinophils Relative: 0 %
HCT: 33.4 % — ABNORMAL LOW (ref 39.0–52.0)
Hemoglobin: 11 g/dL — ABNORMAL LOW (ref 13.0–17.0)
Immature Granulocytes: 1 %
Lymphocytes Relative: 8 %
Lymphs Abs: 0.4 10*3/uL — ABNORMAL LOW (ref 0.7–4.0)
MCH: 30.5 pg (ref 26.0–34.0)
MCHC: 32.9 g/dL (ref 30.0–36.0)
MCV: 92.5 fL (ref 80.0–100.0)
Monocytes Absolute: 0.3 10*3/uL (ref 0.1–1.0)
Monocytes Relative: 6 %
Neutro Abs: 5 10*3/uL (ref 1.7–7.7)
Neutrophils Relative %: 85 %
Platelets: 239 10*3/uL (ref 150–400)
RBC: 3.61 MIL/uL — ABNORMAL LOW (ref 4.22–5.81)
RDW: 13.4 % (ref 11.5–15.5)
WBC: 5.9 10*3/uL (ref 4.0–10.5)
nRBC: 0 % (ref 0.0–0.2)

## 2019-06-01 LAB — FIBRIN DERIVATIVES D-DIMER (ARMC ONLY): Fibrin derivatives D-dimer (ARMC): 758.61 ng/mL (FEU) — ABNORMAL HIGH (ref 0.00–499.00)

## 2019-06-01 LAB — C-REACTIVE PROTEIN: CRP: 2.7 mg/dL — ABNORMAL HIGH (ref <1.0)

## 2019-06-01 LAB — MAGNESIUM: Magnesium: 1.8 mg/dL (ref 1.7–2.4)

## 2019-06-01 MED ORDER — DEXAMETHASONE 6 MG PO TABS: 6.0000 mg | ORAL_TABLET | Freq: Every day | ORAL | 0 refills | Status: AC

## 2019-06-01 NOTE — Progress Notes (Signed)
EMS arrived to transport patient home. Patients son, Nicki Reaper is made aware.

## 2019-06-01 NOTE — TOC Transition Note (Signed)
Transition of Care [facility]) - CM/SW Discharge Note   Patient Details  Name: BAER SNAY MRN: [id number] Date of Birth: 11-04-1931  Transition [facility]) CM/SW Contact:  Trecia Rogers, LCSW Phone Number: 06/01/2019, 9:23 AM   Clinical Narrative:     Patient will have home health with [facility]. Confirmed by Corene Cornea with [facility].  Final next level of care: Home w Home Health Services Barriers to Discharge: No Barriers Identified   Patient Goals and CMS Choice   CMS Medicare.gov Compare Post Acute Care list provided to:: Patient Choice offered to / list presented to : Patient  Discharge Placement                       Discharge Plan and Services                          HH Arranged: PT, Nurse's Aide   Date [facility] Agency Contacted: 06/01/19 Time [place]: [phone] Representative spoke with at [place]: [place] ([place]) Interventions     Readmission Risk Interventions No flowsheet data found.

## 2019-06-01 NOTE — Discharge Summary (Signed)
Physician Discharge Summary  Ivan Salazar [id number] DOB: 10/05/1931 DOA: 05/28/2019  PCP: Birdie Sons, MD  Admit date: 05/28/2019 Discharge date: 06/01/2019  Admitted From: home Disposition: home w/ home health   Recommendations for Outpatient Follow-up:  1. Follow up with PCP in 1-2 weeks   Home Health: yes Equipment/Devices: n/a  Discharge Condition: stable CODE STATUS: full Diet recommendation: Heart Healthy  Brief/Interim Summary: HPI was taken from Dr. Jonelle Sidle: Ivan Salazar is a 86 y.o. male with medical history significant of coronary artery disease, degenerative disc disease, GERD, BPH, hypertension who presents to the ER complaining of fever and weakness that started 2 days ago.  Symptoms have gotten worse in the last 24 hours.  He could not do much of his regular activities.  The temperature was as high as 101.  Patient seen in the ER and evaluated.  Denied any Covid contacts.  Denied any diarrhea.  He did have nausea but no significant vomiting.  Patient's COVID-19 screen was positive and he was noted to have early pneumonia with bilateral infiltrates consistent with Covid pneumonia.  He is being admitted to the hospital for work-up.  He is currently not requiring any oxygen..  ED Course: Temperature 99.6 blood pressure 115/65 pulse 82 respirate of 16 oxygen sats 99% on room air.  White count of 3.0 hemoglobin 10.1 platelets 160.  Sodium 135 potassium 4.9 chloride 103 CO2 of 25 with BUN 24 creatinine 1.36 calcium 8.6.  Glucose is 133.  Fibrinogen 534 and fibrin derivatives 968.  LDH 197 ferritin 548 procalcitonin is less than 10.  CRP currently pending.  Chest x-ray shows posterior lower lobe infiltrate with probable small pleural effusions.  Patient is being admitted with early Covid pneumonia.  Hospital Course from Dr. Lenise Herald 1/27-1/30/21: Pt was found to have COVID19 pneumonia and was treated w/ IV remdesivir, steroids, zinc & vitamin c. Pt completed remdesivir  course while in the hospital. Pt did not require supplemental oxygen throughout hospital stay. Pt was also found to have AKI and hyperbilirubinemia which resolved prior to d/c. PT saw the pt and recommended home health. Home health was set up by CM prior to d/c. Of note, pt had two nights where he was possibly sleep walking and confused. Pt states he does this at his home and eventually gets back in bed. Pt's son, Ivan Salazar was notified of this and understands that pt does this at home as well.    Discharge Diagnoses:  Principal Problem:   Pneumonia due to COVID-19 virus Active Problems:   HLD (hyperlipidemia)   CAD in native artery   Essential (primary) hypertension   Acid reflux    Discharge Instructions  Discharge Instructions    Diet - low sodium heart healthy   Complete by: As directed    Discharge instructions   Complete by: As directed    F/u PCP in 1-2 weeks   Increase activity slowly   Complete by: As directed      Allergies as of 06/01/2019      Reactions   Celebrex [celecoxib] Other (See Comments)   Foul taste in mouth   Mirtazapine Other (See Comments)   Foul taste in mouth      Medication List    TAKE these medications   atorvastatin 80 MG tablet Commonly known as: LIPITOR TAKE ONE TABLET BY MOUTH DAILY What changed: when to take this   clopidogrel 75 MG tablet Commonly known as: PLAVIX TAKE 1 TABLET BY MOUTH DAILY  dexamethasone 6 MG tablet Commonly known as: DECADRON Take 1 tablet (6 mg total) by mouth daily for 6 days.   enalapril 5 MG tablet Commonly known as: VASOTEC TAKE ONE TABLET BY MOUTH DAILY   fluticasone 50 MCG/ACT nasal spray Commonly known as: FLONASE SPRAY TWO SPRAYS IN EACH NOSTRIL ONCE DAILY   furosemide 20 MG tablet Commonly known as: LASIX Take 0.5 tablets (10 mg total) by mouth daily as needed for edema.   GaviLyte-N with Flavor Pack 420 g solution Generic drug: polyethylene glycol-electrolytes GaviLyte-N 420 gram oral  solution   guaiFENesin-codeine 100-10 MG/5ML syrup TAKE 5-10 MLS (1-2 TEASPOONFULS) BY MOUTH EVERY 6 HOURS AS NEEDED FOR COUGH   HYDROcodone-acetaminophen 7.5-325 MG tablet Commonly known as: NORCO 1 tablet every 4-6 hours as needed   latanoprost 0.005 % ophthalmic solution Commonly known as: XALATAN Place 1 drop into both eyes at bedtime.   levocetirizine 5 MG tablet Commonly known as: Xyzal Take 1 tablet (5 mg total) by mouth every evening.   metoprolol succinate 25 MG 24 hr tablet Commonly known as: TOPROL-XL TAKE ONE TABLET BY MOUTH DAILY   mupirocin ointment 2 % Commonly known as: BACTROBAN   nitroGLYCERIN 0.4 MG SL tablet Commonly known as: NITROSTAT Place 1 tablet (0.4 mg total) under the tongue every 5 (five) minutes as needed for chest pain.   testosterone cypionate 200 MG/ML injection Commonly known as: DEPOTESTOSTERONE CYPIONATE Inject 1 mL (200 mg total) into the muscle every 28 (twenty-eight) days.   zolpidem 10 MG tablet Commonly known as: AMBIEN TAKE 1/2 TO 1 TABLET EVERY NIGHT AT BEDTIME What changed:   how much to take  how to take this  when to take this      Follow-up Information    [facility].   Specialty: Home Health Services Why: Physical Therapy, Aid         Allergies  Allergen Reactions  . Celebrex [Celecoxib] Other (See Comments)    Foul taste in mouth   . Mirtazapine Other (See Comments)    Foul taste in mouth    Consultations:   Procedures/Studies: DG Chest 2 View  Result Date: 05/28/2019 CLINICAL DATA:  Fever and cough EXAM: CHEST - 2 VIEW COMPARISON:  07/13/2017 FINDINGS: Post sternotomy changes. Left-sided pacing device as before. Coil superior to the aortic arch. Airspace disease over the posterior lung base best seen on lateral view with probable small pleural effusions. IMPRESSION: Posterior lower lobe infiltrate, best seen on the lateral view. There are probable small pleural  effusions. Electronically Signed   By: Donavan Foil M.D.   On: 05/28/2019 18:32       Subjective: Pt is ready to go home. Pt denies any complaints   Discharge Exam: Vitals:   06/01/19 0304 06/01/19 0824  BP: (!) 156/74 (!) 151/84  Pulse: 79 64  Resp: 20 18  Temp: 98.6 F (37 C) 97.9 F (36.6 C)  SpO2: 99% 98%   Vitals:   05/31/19 1528 05/31/19 1928 06/01/19 0304 06/01/19 0824  BP: 131/65 131/70 (!) 156/74 (!) 151/84  Pulse: 61 62 79 64  Resp: 19 20 20 18   Temp: 97.7 F (36.5 C) 98.3 F (36.8 C) 98.6 F (37 C) 97.9 F (36.6 C)  TempSrc:  Oral    SpO2: 100% 99% 99% 98%  Weight:      Height:        General: Pt is alert, awake, not in acute distress Cardiovascular: S1/S2 +, no rubs,  no gallops Respiratory: diminished breath sounds b/l. no wheezing, no rhonchi Abdominal: Soft, NT, ND, bowel sounds + Extremities: no edema, no cyanosis    The results of significant diagnostics from this hospitalization (including imaging, microbiology, ancillary and laboratory) are listed below for reference.     Microbiology: Recent Results (from the past 240 hour(s))  Blood Culture (routine x 2)     Status: None (Preliminary result)   Collection Time: 05/28/19  8:34 PM   Specimen: BLOOD  Result Value Ref Range Status   Specimen Description BLOOD  Final   Special Requests NONE  Final   Culture   Final    NO GROWTH 4 DAYS Performed at [facility], [address], [place], [place] [address]    Report Status PENDING  Incomplete  Blood Culture (routine x 2)     Status: None (Preliminary result)   Collection Time: 05/28/19  8:39 PM   Specimen: BLOOD  Result Value Ref Range Status   Specimen Description BLOOD  Final   Special Requests NONE  Final   Culture   Final    NO GROWTH 4 DAYS Performed at [facility], [address] [place], [place] [address]    Report Status PENDING  Incomplete     Labs: BNP (last 3 results) No results for input(s): BNP  in the last 8760 hours. Basic Metabolic Panel: Recent Labs  Lab 05/28/19 1804 05/28/19 1804 05/28/19 2059 05/29/19 0630 05/30/19 0406 05/31/19 0800 06/01/19 0714  NA 135  --   --  139 138 139 140  K 4.9  --   --  4.4 4.4 4.2 4.5  CL 103  --   --  109 105 107 109  CO2 25  --   --  22 25 23 22   GLUCOSE 133*  --   --  128* 133* 132* 130*  BUN 24*  --   --  24* 29* 29* 28*  CREATININE 1.47*   < > 1.36* 1.19 1.13 0.98 0.95  CALCIUM 8.6*  --   --  7.9* 8.2* 8.3* 8.4*  MG  --   --   --  1.8 1.8 1.8 1.8  PHOS  --   --   --  3.2 3.0 2.6 2.7   < > = values in this interval not displayed.   Liver Function Tests: Recent Labs  Lab 05/28/19 1804 05/29/19 0630 05/30/19 0406  AST 23 21 29   ALT 13 12 14   ALKPHOS 70 55 51  BILITOT 1.4* 1.3* 1.0  PROT 6.8 6.1* 5.6*  ALBUMIN 3.5 2.8* 2.9*   No results for input(s): LIPASE, AMYLASE in the last 168 hours. No results for input(s): AMMONIA in the last 168 hours. CBC: Recent Labs  Lab 05/28/19 1804 05/28/19 1804 05/28/19 2059 05/29/19 0630 05/30/19 0406 05/31/19 0800 06/01/19 0714  WBC 3.1*   < > 3.0* 1.6* 2.3* 5.5 5.9  NEUTROABS 2.5  --   --  1.3* 1.9 5.0 5.0  HGB 10.4*   < > 10.0* 9.9* 10.4* 11.3* 11.0*  HCT 32.5*   < > 30.3* 30.1* 32.2* 34.7* 33.4*  MCV 94.5   < > 94.1 95.0 94.2 93.0 92.5  PLT 155   < > 160 149* 177 225 239   < > = values in this interval not displayed.   Cardiac Enzymes: No results for input(s): CKTOTAL, CKMB, CKMBINDEX, TROPONINI in the last 168 hours. BNP: Invalid input(s): POCBNP CBG: No results for input(s): GLUCAP in the  last 168 hours. D-Dimer No results for input(s): DDIMER in the last 72 hours. Hgb A1c No results for input(s): HGBA1C in the last 72 hours. Lipid Profile No results for input(s): CHOL, HDL, LDLCALC, TRIG, CHOLHDL, LDLDIRECT in the last 72 hours. Thyroid function studies No results for input(s): TSH, T4TOTAL, T3FREE, THYROIDAB in the last 72 hours.  Invalid input(s):  FREET3 Anemia work up Recent Labs    05/31/19 0800 06/01/19 0714  FERRITIN 676* 684*   Urinalysis    Component Value Date/Time   COLORURINE YELLOW (A) 05/28/2019 1816   APPEARANCEUR HAZY (A) 05/28/2019 1816   LABSPEC 1.026 05/28/2019 1816   PHURINE 5.0 05/28/2019 1816   GLUCOSEU NEGATIVE 05/28/2019 1816   HGBUR NEGATIVE 05/28/2019 1816   BILIRUBINUR NEGATIVE 05/28/2019 1816   KETONESUR NEGATIVE 05/28/2019 1816   PROTEINUR 30 (A) 05/28/2019 1816   NITRITE NEGATIVE 05/28/2019 1816   LEUKOCYTESUR NEGATIVE 05/28/2019 1816   Sepsis Labs Invalid input(s): PROCALCITONIN,  WBC,  LACTICIDVEN Microbiology Recent Results (from the past 240 hour(s))  Blood Culture (routine x 2)     Status: None (Preliminary result)   Collection Time: 05/28/19  8:34 PM   Specimen: BLOOD  Result Value Ref Range Status   Specimen Description BLOOD  Final   Special Requests NONE  Final   Culture   Final    NO GROWTH 4 DAYS Performed at [facility], [address], [place], [place] [address]    Report Status PENDING  Incomplete  Blood Culture (routine x 2)     Status: None (Preliminary result)   Collection Time: 05/28/19  8:39 PM   Specimen: BLOOD  Result Value Ref Range Status   Specimen Description BLOOD  Final   Special Requests NONE  Final   Culture   Final    NO GROWTH 4 DAYS Performed at [facility], [address] [place], [place] [address]    Report Status PENDING  Incomplete     Time coordinating discharge: Over 30 minutes  SIGNED:   Wyvonnia Dusky, MD  [facility] 06/01/2019, 9:16 AM Pager   If 7PM-7AM, please contact night-coverage [email or web address]

## 2019-06-01 NOTE — Progress Notes (Signed)
Educated patient on discharge instructions and patient verbalized understanding. Patient is aware of the importance of quarantine and wearing a mask due to covid-19. Patient will be transported home by EMS.

## 2019-06-01 NOTE — Progress Notes (Signed)
   Patient again confused and disoriented to place and situation overnight. Repeatedly setting off bed alarm, becoming argumentative with staff about following safety precautions, and demanding a "bigger bed." Patient unable to be redirected, insisting he needs to leave. Patient unable to complete teach back in regards to Covid-19 signs and symptoms and safety measures to take at home.

## 2019-06-02 LAB — CULTURE, BLOOD (ROUTINE X 2)
Culture: NO GROWTH
Culture: NO GROWTH

## 2019-06-04 DIAGNOSIS — J1282 Pneumonia due to coronavirus disease 2019: Secondary | ICD-10-CM | POA: Diagnosis not present

## 2019-06-04 DIAGNOSIS — Z950 Presence of cardiac pacemaker: Secondary | ICD-10-CM | POA: Diagnosis not present

## 2019-06-04 DIAGNOSIS — Z9181 History of falling: Secondary | ICD-10-CM | POA: Diagnosis not present

## 2019-06-04 DIAGNOSIS — H409 Unspecified glaucoma: Secondary | ICD-10-CM | POA: Diagnosis not present

## 2019-06-04 DIAGNOSIS — K219 Gastro-esophageal reflux disease without esophagitis: Secondary | ICD-10-CM | POA: Diagnosis not present

## 2019-06-04 DIAGNOSIS — Z7952 Long term (current) use of systemic steroids: Secondary | ICD-10-CM | POA: Diagnosis not present

## 2019-06-04 DIAGNOSIS — Z951 Presence of aortocoronary bypass graft: Secondary | ICD-10-CM | POA: Diagnosis not present

## 2019-06-04 DIAGNOSIS — R351 Nocturia: Secondary | ICD-10-CM | POA: Diagnosis not present

## 2019-06-04 DIAGNOSIS — N529 Male erectile dysfunction, unspecified: Secondary | ICD-10-CM | POA: Diagnosis not present

## 2019-06-04 DIAGNOSIS — Z87891 Personal history of nicotine dependence: Secondary | ICD-10-CM | POA: Diagnosis not present

## 2019-06-04 DIAGNOSIS — M5030 Other cervical disc degeneration, unspecified cervical region: Secondary | ICD-10-CM | POA: Diagnosis not present

## 2019-06-04 DIAGNOSIS — Z79891 Long term (current) use of opiate analgesic: Secondary | ICD-10-CM | POA: Diagnosis not present

## 2019-06-04 DIAGNOSIS — E785 Hyperlipidemia, unspecified: Secondary | ICD-10-CM | POA: Diagnosis not present

## 2019-06-04 DIAGNOSIS — Z7902 Long term (current) use of antithrombotics/antiplatelets: Secondary | ICD-10-CM | POA: Diagnosis not present

## 2019-06-04 DIAGNOSIS — Z95818 Presence of other cardiac implants and grafts: Secondary | ICD-10-CM | POA: Diagnosis not present

## 2019-06-04 DIAGNOSIS — U071 COVID-19: Secondary | ICD-10-CM | POA: Diagnosis not present

## 2019-06-04 DIAGNOSIS — I10 Essential (primary) hypertension: Secondary | ICD-10-CM | POA: Diagnosis not present

## 2019-06-04 DIAGNOSIS — N179 Acute kidney failure, unspecified: Secondary | ICD-10-CM | POA: Diagnosis not present

## 2019-06-04 DIAGNOSIS — N401 Enlarged prostate with lower urinary tract symptoms: Secondary | ICD-10-CM | POA: Diagnosis not present

## 2019-06-04 DIAGNOSIS — I2510 Atherosclerotic heart disease of native coronary artery without angina pectoris: Secondary | ICD-10-CM | POA: Diagnosis not present

## 2019-06-05 ENCOUNTER — Telehealth: Payer: Self-pay | Admitting: Family Medicine

## 2019-06-05 NOTE — Telephone Encounter (Signed)
That's fine

## 2019-06-05 NOTE — Telephone Encounter (Signed)
Home Health Verbal Orders - Caller/Agency: [facility] Number: [phone] Requesting OT/PT/Skilled Nursing/Social Work/Speech Therapy: PT and Home Health Aid Frequency:   PT:  2w2 1w3  Home Health Aid: 1w1 2w4

## 2019-06-06 NOTE — Telephone Encounter (Signed)
Verbal orders given to Lynn 

## 2019-06-07 DIAGNOSIS — J1282 Pneumonia due to coronavirus disease 2019: Secondary | ICD-10-CM | POA: Diagnosis not present

## 2019-06-07 DIAGNOSIS — I10 Essential (primary) hypertension: Secondary | ICD-10-CM | POA: Diagnosis not present

## 2019-06-07 DIAGNOSIS — I2510 Atherosclerotic heart disease of native coronary artery without angina pectoris: Secondary | ICD-10-CM | POA: Diagnosis not present

## 2019-06-07 DIAGNOSIS — N179 Acute kidney failure, unspecified: Secondary | ICD-10-CM | POA: Diagnosis not present

## 2019-06-07 DIAGNOSIS — U071 COVID-19: Secondary | ICD-10-CM | POA: Diagnosis not present

## 2019-06-07 DIAGNOSIS — M5030 Other cervical disc degeneration, unspecified cervical region: Secondary | ICD-10-CM | POA: Diagnosis not present

## 2019-06-10 ENCOUNTER — Ambulatory Visit (INDEPENDENT_AMBULATORY_CARE_PROVIDER_SITE_OTHER): Payer: Medicare Other | Admitting: Family Medicine

## 2019-06-10 DIAGNOSIS — J1282 Pneumonia due to coronavirus disease 2019: Secondary | ICD-10-CM | POA: Diagnosis not present

## 2019-06-10 DIAGNOSIS — U071 COVID-19: Secondary | ICD-10-CM

## 2019-06-10 NOTE — Progress Notes (Signed)
Patient: Ivan Salazar Male    DOB: 1931-10-05   86 y.o.   MRN: [id number] Visit Date: 06/10/2019  Today's Provider: Lavon Paganini, MD   Chief Complaint  Patient presents with  . Hospitalization Follow-up   Subjective:    Virtual Visit via Telephone Note  I connected with Ivan Salazar on 06/10/19 at  1:20 PM EST by telephone and verified that I am speaking with the correct person using two identifiers.  Location: Patient location: home Provider location: [facility] Persons involved in the visit: patient, provider    I discussed the limitations, risks, security and privacy concerns of performing an evaluation and management service by telephone and the availability of in person appointments. I also discussed with the patient that there may be a patient responsible charge related to this service. The patient expressed understanding and agreed to proceed.  HPI  Follow up Hospitalization  Patient was admitted to [facility] on 05/29/2019 and discharged on 06/01/2019. He was treated for COVID-19 and Pneumonia. Treatment for this included pt was sent home on a six day taper of dexamethasone, which he stated he finished. Telephone follow up was not done He reports excellent compliance with treatment. He reports this condition is Improved.  Breathing is stable at baseline.  Fevers have now resolved.  Feeling mostly back to himself.  He is getting HH PT and aide services.  He is eager to get his COVID19 vaccination and wondering if this is safe ------------------------------------------------------------------------------------    Allergies  Allergen Reactions  . Celebrex [Celecoxib] Other (See Comments)    Foul taste in mouth   . Mirtazapine Other (See Comments)    Foul taste in mouth     Current Outpatient Medications:  .  atorvastatin (LIPITOR) 80 MG tablet, TAKE ONE TABLET BY MOUTH DAILY (Patient taking differently: Take 80 mg by mouth daily at 6 PM.  ), Disp: 90 tablet, Rfl: 3 .  clopidogrel (PLAVIX) 75 MG tablet, TAKE 1 TABLET BY MOUTH DAILY (Patient taking differently: Take 75 mg by mouth daily. ), Disp: 90 tablet, Rfl: 3 .  enalapril (VASOTEC) 5 MG tablet, TAKE ONE TABLET BY MOUTH DAILY (Patient taking differently: Take 5 mg by mouth daily. ), Disp: 90 tablet, Rfl: 4 .  fluticasone (FLONASE) 50 MCG/ACT nasal spray, SPRAY TWO SPRAYS IN EACH NOSTRIL ONCE DAILY, Disp: 16 mL, Rfl: 3 .  furosemide (LASIX) 20 MG tablet, Take 0.5 tablets (10 mg total) by mouth daily as needed for edema., Disp: 1 tablet, Rfl: 0 .  guaiFENesin-codeine 100-10 MG/5ML syrup, TAKE 5-10 MLS (1-2 TEASPOONFULS) BY MOUTH EVERY 6 HOURS AS NEEDED FOR COUGH, Disp: 120 mL, Rfl: 1 .  HYDROcodone-acetaminophen (NORCO) 7.5-325 MG tablet, 1 tablet every 4-6 hours as needed, Disp: 180 tablet, Rfl: 0 .  latanoprost (XALATAN) 0.005 % ophthalmic solution, Place 1 drop into both eyes at bedtime. , Disp: , Rfl:  .  levocetirizine (XYZAL) 5 MG tablet, Take 1 tablet (5 mg total) by mouth every evening., Disp: 30 tablet, Rfl: 12 .  metoprolol succinate (TOPROL-XL) 25 MG 24 hr tablet, TAKE ONE TABLET BY MOUTH DAILY (Patient taking differently: Take 25 mg by mouth daily. ), Disp: 90 tablet, Rfl: 4 .  mupirocin ointment (BACTROBAN) 2 %, , Disp: , Rfl:  .  polyethylene glycol-electrolytes (GAVILYTE-N WITH FLAVOR PACK) 420 g solution, GaviLyte-N 420 gram oral solution, Disp: , Rfl:  .  testosterone cypionate (DEPOTESTOSTERONE CYPIONATE) 200 MG/ML injection, Inject 1 mL (200 mg  total) into the muscle every 28 (twenty-eight) days., Disp: 3 mL, Rfl: 0 .  zolpidem (AMBIEN) 10 MG tablet, TAKE 1/2 TO 1 TABLET EVERY NIGHT AT BEDTIME (Patient taking differently: Take 5-10 mg by mouth at bedtime. TAKE 1/2 TO 1 TABLET EVERY NIGHT AT BEDTIME), Disp: 30 tablet, Rfl: 3 .  nitroGLYCERIN (NITROSTAT) 0.4 MG SL tablet, Place 1 tablet (0.4 mg total) under the tongue every 5 (five) minutes as needed for chest pain.  (Patient not taking: Reported on 06/10/2019), Disp: 20 tablet, Rfl: 1  Review of Systems  Constitutional: Negative.   Respiratory: Negative.   Cardiovascular: Negative.   Gastrointestinal: Negative.   Neurological: Negative for dizziness, light-headedness and headaches.    Social History   Tobacco Use  . Smoking status: Former Smoker    Packs/day: 0.50    Years: 20.00    Pack years: 10.00    Quit date: 05/01/1977    Years since quitting: 42.1  . Smokeless tobacco: Never Used  Substance Use Topics  . Alcohol use: Yes    Alcohol/week: 7.0 - 14.0 standard drinks    Types: 7 - 14 Shots of liquor per week    Comment: moderate; 1-2 drinks daily      Objective:   There were no vitals taken for this visit. There were no vitals filed for this visit.There is no height or weight on file to calculate BMI.   Physical Exam Speaking in full sentences in NAD  No results found for any visits on 06/10/19.     Assessment & Plan    Follow Up Instructions: I discussed the assessment and treatment plan with the patient. The patient was provided an opportunity to ask questions and all were answered. The patient agreed with the plan and demonstrated an understanding of the instructions.   The patient was advised to call back or seek an in-person evaluation if the symptoms worsen or if the condition fails to improve as anticipated.  Total time spent on today's visit was greater than 30 minutes, including nonface-to-face time personally spent on review of chart (labs and imaging), discussing labs and goals, discussing further work-up, treatment options, referrals to specialist if needed, reviewing outside records of pertinent, answering patient's questions, and coordinating care.    1. Pneumonia due to COVID-19 virus - recent hospitalization -Patient had no respiratory failure and did not need supplemental oxygen throughout his hospital stay -His breathing remains at baseline and  well-controlled -He is improving slowly and gaining his strength back while working with home health physical therapy -All of his home health services have been set up -Discussed that we believe that people have natural immunity after COVID-19 infection for about 3 months, but he that he is still eligible for Covid vaccination once he has improved from his hospitalization -We did discuss the 10-day self-isolation from the start of illness -Did still discussed the importance of wearing a mask and social distancing -He did have some lab abnormalities during hospitalization, but these had resolved back to baseline prior to discharge, so he does not need repeat lab work at this time -Did help add patient to COVID-19 vaccination waitlist for [place] -Follow-up as needed -Regular follow-up with PCP as previously scheduled   The entirety of the information documented in the History of Present Illness, Review of Systems and Physical Exam were personally obtained by me. Portions of this information were initially documented by Ashley Royalty, CMA and reviewed by me for thoroughness and accuracy.    Makyiah Lie,  Dionne Bucy, MD MPH [facility]

## 2019-06-12 DIAGNOSIS — U071 COVID-19: Secondary | ICD-10-CM | POA: Diagnosis not present

## 2019-06-12 DIAGNOSIS — N179 Acute kidney failure, unspecified: Secondary | ICD-10-CM | POA: Diagnosis not present

## 2019-06-12 DIAGNOSIS — I10 Essential (primary) hypertension: Secondary | ICD-10-CM | POA: Diagnosis not present

## 2019-06-12 DIAGNOSIS — J1282 Pneumonia due to coronavirus disease 2019: Secondary | ICD-10-CM | POA: Diagnosis not present

## 2019-06-12 DIAGNOSIS — I2510 Atherosclerotic heart disease of native coronary artery without angina pectoris: Secondary | ICD-10-CM | POA: Diagnosis not present

## 2019-06-12 DIAGNOSIS — M5030 Other cervical disc degeneration, unspecified cervical region: Secondary | ICD-10-CM | POA: Diagnosis not present

## 2019-06-13 DIAGNOSIS — M5030 Other cervical disc degeneration, unspecified cervical region: Secondary | ICD-10-CM | POA: Diagnosis not present

## 2019-06-13 DIAGNOSIS — I10 Essential (primary) hypertension: Secondary | ICD-10-CM | POA: Diagnosis not present

## 2019-06-13 DIAGNOSIS — N179 Acute kidney failure, unspecified: Secondary | ICD-10-CM | POA: Diagnosis not present

## 2019-06-13 DIAGNOSIS — J1282 Pneumonia due to coronavirus disease 2019: Secondary | ICD-10-CM | POA: Diagnosis not present

## 2019-06-13 DIAGNOSIS — U071 COVID-19: Secondary | ICD-10-CM | POA: Diagnosis not present

## 2019-06-13 DIAGNOSIS — I2510 Atherosclerotic heart disease of native coronary artery without angina pectoris: Secondary | ICD-10-CM | POA: Diagnosis not present

## 2019-06-14 DIAGNOSIS — I10 Essential (primary) hypertension: Secondary | ICD-10-CM | POA: Diagnosis not present

## 2019-06-14 DIAGNOSIS — U071 COVID-19: Secondary | ICD-10-CM | POA: Diagnosis not present

## 2019-06-14 DIAGNOSIS — J1282 Pneumonia due to coronavirus disease 2019: Secondary | ICD-10-CM | POA: Diagnosis not present

## 2019-06-14 DIAGNOSIS — I2510 Atherosclerotic heart disease of native coronary artery without angina pectoris: Secondary | ICD-10-CM | POA: Diagnosis not present

## 2019-06-14 DIAGNOSIS — M5030 Other cervical disc degeneration, unspecified cervical region: Secondary | ICD-10-CM | POA: Diagnosis not present

## 2019-06-14 DIAGNOSIS — N179 Acute kidney failure, unspecified: Secondary | ICD-10-CM | POA: Diagnosis not present

## 2019-06-17 DIAGNOSIS — N179 Acute kidney failure, unspecified: Secondary | ICD-10-CM | POA: Diagnosis not present

## 2019-06-17 DIAGNOSIS — I2510 Atherosclerotic heart disease of native coronary artery without angina pectoris: Secondary | ICD-10-CM | POA: Diagnosis not present

## 2019-06-17 DIAGNOSIS — J1282 Pneumonia due to coronavirus disease 2019: Secondary | ICD-10-CM | POA: Diagnosis not present

## 2019-06-17 DIAGNOSIS — U071 COVID-19: Secondary | ICD-10-CM | POA: Diagnosis not present

## 2019-06-17 DIAGNOSIS — M5030 Other cervical disc degeneration, unspecified cervical region: Secondary | ICD-10-CM | POA: Diagnosis not present

## 2019-06-17 DIAGNOSIS — I10 Essential (primary) hypertension: Secondary | ICD-10-CM | POA: Diagnosis not present

## 2019-06-18 DIAGNOSIS — M5030 Other cervical disc degeneration, unspecified cervical region: Secondary | ICD-10-CM | POA: Diagnosis not present

## 2019-06-18 DIAGNOSIS — I10 Essential (primary) hypertension: Secondary | ICD-10-CM | POA: Diagnosis not present

## 2019-06-18 DIAGNOSIS — J1282 Pneumonia due to coronavirus disease 2019: Secondary | ICD-10-CM | POA: Diagnosis not present

## 2019-06-18 DIAGNOSIS — I2510 Atherosclerotic heart disease of native coronary artery without angina pectoris: Secondary | ICD-10-CM | POA: Diagnosis not present

## 2019-06-18 DIAGNOSIS — U071 COVID-19: Secondary | ICD-10-CM | POA: Diagnosis not present

## 2019-06-18 DIAGNOSIS — N179 Acute kidney failure, unspecified: Secondary | ICD-10-CM | POA: Diagnosis not present

## 2019-06-21 DIAGNOSIS — J1282 Pneumonia due to coronavirus disease 2019: Secondary | ICD-10-CM | POA: Diagnosis not present

## 2019-06-21 DIAGNOSIS — I10 Essential (primary) hypertension: Secondary | ICD-10-CM | POA: Diagnosis not present

## 2019-06-21 DIAGNOSIS — M5030 Other cervical disc degeneration, unspecified cervical region: Secondary | ICD-10-CM | POA: Diagnosis not present

## 2019-06-21 DIAGNOSIS — I2510 Atherosclerotic heart disease of native coronary artery without angina pectoris: Secondary | ICD-10-CM | POA: Diagnosis not present

## 2019-06-21 DIAGNOSIS — N179 Acute kidney failure, unspecified: Secondary | ICD-10-CM | POA: Diagnosis not present

## 2019-06-21 DIAGNOSIS — U071 COVID-19: Secondary | ICD-10-CM | POA: Diagnosis not present

## 2019-06-25 DIAGNOSIS — U071 COVID-19: Secondary | ICD-10-CM | POA: Diagnosis not present

## 2019-06-25 DIAGNOSIS — I2510 Atherosclerotic heart disease of native coronary artery without angina pectoris: Secondary | ICD-10-CM | POA: Diagnosis not present

## 2019-06-25 DIAGNOSIS — N179 Acute kidney failure, unspecified: Secondary | ICD-10-CM | POA: Diagnosis not present

## 2019-06-25 DIAGNOSIS — M5030 Other cervical disc degeneration, unspecified cervical region: Secondary | ICD-10-CM | POA: Diagnosis not present

## 2019-06-25 DIAGNOSIS — J1282 Pneumonia due to coronavirus disease 2019: Secondary | ICD-10-CM | POA: Diagnosis not present

## 2019-06-25 DIAGNOSIS — I10 Essential (primary) hypertension: Secondary | ICD-10-CM | POA: Diagnosis not present

## 2019-06-26 DIAGNOSIS — M5030 Other cervical disc degeneration, unspecified cervical region: Secondary | ICD-10-CM | POA: Diagnosis not present

## 2019-06-26 DIAGNOSIS — I10 Essential (primary) hypertension: Secondary | ICD-10-CM | POA: Diagnosis not present

## 2019-06-26 DIAGNOSIS — N179 Acute kidney failure, unspecified: Secondary | ICD-10-CM | POA: Diagnosis not present

## 2019-06-26 DIAGNOSIS — I2510 Atherosclerotic heart disease of native coronary artery without angina pectoris: Secondary | ICD-10-CM | POA: Diagnosis not present

## 2019-06-26 DIAGNOSIS — J1282 Pneumonia due to coronavirus disease 2019: Secondary | ICD-10-CM | POA: Diagnosis not present

## 2019-06-26 DIAGNOSIS — U071 COVID-19: Secondary | ICD-10-CM | POA: Diagnosis not present

## 2019-06-27 ENCOUNTER — Ambulatory Visit: Payer: Self-pay

## 2019-06-27 ENCOUNTER — Ambulatory Visit: Payer: Medicare Other

## 2019-07-01 ENCOUNTER — Other Ambulatory Visit: Payer: Self-pay | Admitting: Family Medicine

## 2019-07-01 DIAGNOSIS — R053 Chronic cough: Secondary | ICD-10-CM

## 2019-07-01 DIAGNOSIS — R05 Cough: Secondary | ICD-10-CM

## 2019-07-01 NOTE — Telephone Encounter (Signed)
Requested medication (s) are due for refill today: yes  Requested medication (s) are on the active medication list: yes  Last refill:  05/27/19  Future visit scheduled: no  Notes to clinic:  medication not delegated to NT to refill   Requested Prescriptions  Pending Prescriptions Disp Refills   guaiFENesin-codeine 100-10 MG/5ML syrup [Pharmacy Med Name: guaiFENesin-CODEIN 100-10MG /5ML SOL] 120 mL 0    Sig: TAKE 5 TO 10 ML (1-2 TSP) BY MOUTH EVERY 6 HOURS AS NEEDED FOR COUGH      Off-Protocol Failed - 07/01/2019 11:20 AM      Failed - Medication not assigned to a protocol, review manually.      Passed - Valid encounter within last 12 months    Recent Outpatient Visits           3 weeks ago Pneumonia due to COVID-19 virus   [facility], Dionne Bucy, MD   1 month ago DDD (degenerative disc disease), lumbosacral   [facility] Birdie Sons, MD   3 months ago Acute non-recurrent pansinusitis   [place], [place]   3 months ago Rash of back   [facility] Birdie Sons, MD   1 year ago Loss of weight   [facility] Birdie Sons, MD

## 2019-07-02 DIAGNOSIS — I2510 Atherosclerotic heart disease of native coronary artery without angina pectoris: Secondary | ICD-10-CM | POA: Diagnosis not present

## 2019-07-02 DIAGNOSIS — J1282 Pneumonia due to coronavirus disease 2019: Secondary | ICD-10-CM | POA: Diagnosis not present

## 2019-07-02 DIAGNOSIS — I10 Essential (primary) hypertension: Secondary | ICD-10-CM | POA: Diagnosis not present

## 2019-07-02 DIAGNOSIS — M5030 Other cervical disc degeneration, unspecified cervical region: Secondary | ICD-10-CM | POA: Diagnosis not present

## 2019-07-02 DIAGNOSIS — U071 COVID-19: Secondary | ICD-10-CM | POA: Diagnosis not present

## 2019-07-02 DIAGNOSIS — N179 Acute kidney failure, unspecified: Secondary | ICD-10-CM | POA: Diagnosis not present

## 2019-07-04 ENCOUNTER — Ambulatory Visit (INDEPENDENT_AMBULATORY_CARE_PROVIDER_SITE_OTHER): Payer: Medicare Other | Admitting: *Deleted

## 2019-07-04 ENCOUNTER — Ambulatory Visit: Payer: Medicare Other

## 2019-07-04 ENCOUNTER — Other Ambulatory Visit: Payer: Self-pay

## 2019-07-04 DIAGNOSIS — E291 Testicular hypofunction: Secondary | ICD-10-CM

## 2019-07-04 MED ORDER — TESTOSTERONE CYPIONATE 200 MG/ML IM SOLN
200.0000 mg | Freq: Once | INTRAMUSCULAR | Status: AC
  Administered 2019-07-04: 11:00:00 200 mg via INTRAMUSCULAR

## 2019-07-04 MED ORDER — TESTOSTERONE CYPIONATE 200 MG/ML IM SOLN: 200.0000 mg | INTRAMUSCULAR | 0 refills | Status: DC

## 2019-07-04 NOTE — Progress Notes (Signed)
Testosterone IM Injection  Due to Hypogonadism patient is present today for a Testosterone Injection.  Medication: Testosterone Cypionate Dose: 76ml Location: right upper outer buttocks Lot: [id number] Exp:05/22  Patient tolerated well, no complications were noted  Performed by: Verlene Mayer, CMA  Follow up: 28 days  Refill sent to pharmacy.

## 2019-07-05 ENCOUNTER — Other Ambulatory Visit: Payer: Self-pay | Admitting: Family Medicine

## 2019-07-05 ENCOUNTER — Ambulatory Visit: Payer: Medicare Other | Attending: Internal Medicine

## 2019-07-05 DIAGNOSIS — Z23 Encounter for immunization: Secondary | ICD-10-CM

## 2019-07-05 DIAGNOSIS — G4700 Insomnia, unspecified: Secondary | ICD-10-CM

## 2019-07-05 NOTE — Telephone Encounter (Signed)
Refill request for  ZOLPIDEM TARTRATE 10 MG TABLET.

## 2019-07-05 NOTE — Progress Notes (Signed)
   Covid-19 Vaccination Clinic  Name:  Ivan Salazar    MRN: [id number] DOB: 02-19-1932  07/05/2019  Ivan Salazar was observed post Covid-19 immunization for 15 minutes without incident. He was provided with Vaccine Information Sheet and instruction to access the V-Safe system.   Ivan Salazar was instructed to call 911 with any severe reactions post vaccine: Marland Kitchen Difficulty breathing  . Swelling of face and throat  . A fast heartbeat  . A bad rash all over body  . Dizziness and weakness   Immunizations Administered    Name Date Dose VIS Date Route   Pfizer COVID-19 Vaccine 07/05/2019  9:45 AM 0.3 mL 04/12/2019 Intramuscular   Manufacturer: [place]   Lot: [id number]   [place]: [id number]

## 2019-07-16 ENCOUNTER — Other Ambulatory Visit: Payer: Self-pay | Admitting: Family Medicine

## 2019-07-16 DIAGNOSIS — R05 Cough: Secondary | ICD-10-CM

## 2019-07-16 DIAGNOSIS — R053 Chronic cough: Secondary | ICD-10-CM

## 2019-07-16 NOTE — Telephone Encounter (Signed)
LOV 06/10/19 Pneumonia due to Covid 07/01/19 LRF 120 ml

## 2019-07-16 NOTE — Telephone Encounter (Signed)
Requested medication (s) are due for refill today: yes  Requested medication (s) are on the active medication list: yes  Last refill:  07/01/19  Future visit scheduled: no  Notes to clinic:  medication not delegated to NT to refill   Requested Prescriptions  Pending Prescriptions Disp Refills   guaiFENesin-codeine 100-10 MG/5ML syrup [Pharmacy Med Name: guaiFENesin-CODEIN 100-10MG /5ML SOL] 120 mL 0    Sig: TAKE ONE TO TWO TEASPOONSFUL (5-10 MILLILITERS) BY MOUTH EVERY SIX HOURS AS NEEDED FOR COUGH      Off-Protocol Failed - 07/16/2019  9:30 AM      Failed - Medication not assigned to a protocol, review manually.      Passed - Valid encounter within last 12 months    Recent Outpatient Visits           1 month ago Pneumonia due to COVID-19 virus   [facility], Dionne Bucy, MD   2 months ago DDD (degenerative disc disease), lumbosacral   [facility] Birdie Sons, MD   3 months ago Acute non-recurrent pansinusitis   [place], [place]   4 months ago Rash of back   [facility] Birdie Sons, MD   1 year ago Loss of weight   [facility] Birdie Sons, MD       Future Appointments             In 1 week Ralene Bathe, MD [place]

## 2019-07-18 ENCOUNTER — Ambulatory Visit: Payer: Medicare Other | Admitting: Family Medicine

## 2019-07-19 ENCOUNTER — Other Ambulatory Visit: Payer: Self-pay

## 2019-07-19 ENCOUNTER — Ambulatory Visit (INDEPENDENT_AMBULATORY_CARE_PROVIDER_SITE_OTHER): Payer: Medicare Other | Admitting: Family Medicine

## 2019-07-19 ENCOUNTER — Encounter: Payer: Self-pay | Admitting: Family Medicine

## 2019-07-19 VITALS — BP 103/60 | HR 65 | Temp 97.3°F | Wt 138.0 lb

## 2019-07-19 DIAGNOSIS — R208 Other disturbances of skin sensation: Secondary | ICD-10-CM

## 2019-07-19 DIAGNOSIS — E860 Dehydration: Secondary | ICD-10-CM | POA: Diagnosis not present

## 2019-07-19 NOTE — Progress Notes (Signed)
Patient: Ivan Salazar Male    DOB: 11-07-1931   86 y.o.   MRN: [id number] Visit Date: 07/19/2019  Today's Provider: Lavon Paganini, MD   No chief complaint on file.  Subjective:     HPI   Patient is concerned about new coldness and withered/wrinkled appearance of bilateral hands.  This has been present for about 2 to 3 days.  No pain, numbness.  There is some stiffness of his joints.  He has some joint contractures at baseline.  He has history of bilateral carpal tunnel syndrome which required corticosteroid injections and is worried that this may be a recurrence.  He admits that he does not drink much water.  Allergies  Allergen Reactions  . Celebrex [Celecoxib] Other (See Comments)    Foul taste in mouth   . Mirtazapine Other (See Comments)    Foul taste in mouth     Current Outpatient Medications:  .  atorvastatin (LIPITOR) 80 MG tablet, TAKE ONE TABLET BY MOUTH DAILY (Patient taking differently: Take 80 mg by mouth daily at 6 PM. ), Disp: 90 tablet, Rfl: 3 .  clopidogrel (PLAVIX) 75 MG tablet, TAKE 1 TABLET BY MOUTH DAILY (Patient taking differently: Take 75 mg by mouth daily. ), Disp: 90 tablet, Rfl: 3 .  enalapril (VASOTEC) 5 MG tablet, TAKE ONE TABLET BY MOUTH DAILY (Patient taking differently: Take 5 mg by mouth daily. ), Disp: 90 tablet, Rfl: 4 .  fluticasone (FLONASE) 50 MCG/ACT nasal spray, SPRAY TWO SPRAYS IN EACH NOSTRIL ONCE DAILY, Disp: 16 mL, Rfl: 3 .  guaiFENesin-codeine 100-10 MG/5ML syrup, TAKE ONE TO TWO TEASPOONSFUL (5-10 MILLILITERS) BY MOUTH EVERY SIX HOURS AS NEEDED FOR COUGH, Disp: 120 mL, Rfl: 3 .  HYDROcodone-acetaminophen (NORCO) 7.5-325 MG tablet, 1 tablet every 4-6 hours as needed, Disp: 180 tablet, Rfl: 0 .  latanoprost (XALATAN) 0.005 % ophthalmic solution, Place 1 drop into both eyes at bedtime. , Disp: , Rfl:  .  metoprolol succinate (TOPROL-XL) 25 MG 24 hr tablet, TAKE ONE TABLET BY MOUTH DAILY (Patient taking differently: Take 25 mg by  mouth daily. ), Disp: 90 tablet, Rfl: 4 .  polyethylene glycol-electrolytes (GAVILYTE-N WITH FLAVOR PACK) 420 g solution, GaviLyte-N 420 gram oral solution, Disp: , Rfl:  .  testosterone cypionate (DEPOTESTOSTERONE CYPIONATE) 200 MG/ML injection, Inject 1 mL (200 mg total) into the muscle every 28 (twenty-eight) days., Disp: 3 mL, Rfl: 0 .  zolpidem (AMBIEN) 10 MG tablet, Take 0.5-1 tablets (5-10 mg total) by mouth at bedtime. TAKE 1/2 TO 1 TABLET EVERY NIGHT AT BEDTIME, Disp: 30 tablet, Rfl: 2 .  furosemide (LASIX) 20 MG tablet, Take 0.5 tablets (10 mg total) by mouth daily as needed for edema. (Patient not taking: Reported on 07/19/2019), Disp: 1 tablet, Rfl: 0 .  levocetirizine (XYZAL) 5 MG tablet, Take 1 tablet (5 mg total) by mouth every evening. (Patient not taking: Reported on 07/19/2019), Disp: 30 tablet, Rfl: 12 .  mupirocin ointment (BACTROBAN) 2 %, , Disp: , Rfl:  .  nitroGLYCERIN (NITROSTAT) 0.4 MG SL tablet, Place 1 tablet (0.4 mg total) under the tongue every 5 (five) minutes as needed for chest pain. (Patient not taking: Reported on 07/19/2019), Disp: 20 tablet, Rfl: 1  Review of Systems  Constitutional: Negative.   Musculoskeletal: Positive for arthralgias, joint swelling and neck pain. Negative for back pain, gait problem, myalgias and neck stiffness.  Neurological: Negative for dizziness, weakness, light-headedness, numbness and headaches.    Social History  Tobacco Use  . Smoking status: Former Smoker    Packs/day: 0.50    Years: 20.00    Pack years: 10.00    Quit date: 05/01/1977    Years since quitting: 42.2  . Smokeless tobacco: Never Used  Substance Use Topics  . Alcohol use: Yes    Alcohol/week: 7.0 - 14.0 standard drinks    Types: 7 - 14 Shots of liquor per week    Comment: moderate; 1-2 drinks daily      Objective:   BP (!) 97/57 (BP Location: Left Arm, Patient Position: Sitting, Cuff Size: Normal)   Pulse 65   Temp (!) 97.3 F (36.3 C) (Temporal)   Wt  138 lb (62.6 kg)   BMI 19.80 kg/m  Vitals:   07/19/19 1046  BP: (!) 97/57  Pulse: 65  Temp: (!) 97.3 F (36.3 C)  TempSrc: Temporal  Weight: 138 lb (62.6 kg)  Body mass index is 19.8 kg/m.   Physical Exam Vitals reviewed.  Constitutional:      General: He is not in acute distress.    Appearance: Normal appearance. He is not diaphoretic.  HENT:     Head: Normocephalic and atraumatic.  Eyes:     General: No scleral icterus.    Conjunctiva/sclera: Conjunctivae normal.  Cardiovascular:     Rate and Rhythm: Normal rate and regular rhythm.     Pulses: Normal pulses.  Pulmonary:     Effort: Pulmonary effort is normal. No respiratory distress.     Breath sounds: Normal breath sounds. No wheezing.  Musculoskeletal:        General: No swelling or tenderness.     Comments: Slight contracture of all fingers on bilateral hands.  Range of motion is intact.  Good opposition with thumb.  Negative Phalen's and Tinel's bilaterally  Skin:    Capillary Refill: Capillary refill takes less than 2 seconds.     Comments: Both hands are cool to the touch.  Skin tenting on posterior hand bilaterally.  Neurological:     Mental Status: He is alert and oriented to person, place, and time. Mental status is at baseline.     Sensory: No sensory deficit.     Motor: No weakness.  Psychiatric:        Mood and Affect: Mood normal.        Behavior: Behavior normal.      No results found for any visits on 07/19/19.     Assessment & Plan    1. Complaining of cold hands 2. Dehydration -New problem x2 to 3 days -No abnormalities on exam to suggest carpal tunnel syndrome and history is not consistent with that either -Patient has good pulses and good cap refill suggesting no vascular abnormality causing the coolness of his hands -He does have increased skin tenting and low normal blood pressure, which suggests hypovolemia which may be causing his symptoms -Advised him to take 1 extra glass of water  per day and to stay well-hydrated -Discussed return precautions    No orders of the defined types were placed in this encounter.    Return if symptoms worsen or fail to improve.   The entirety of the information documented in the History of Present Illness, Review of Systems and Physical Exam were personally obtained by me. Portions of this information were initially documented by Ashley Royalty, CMA and reviewed by me for thoroughness and accuracy.    [place]ingham Millette, Dionne Bucy, MD MPH [facility]

## 2019-07-23 DIAGNOSIS — R0602 Shortness of breath: Secondary | ICD-10-CM | POA: Diagnosis not present

## 2019-07-23 DIAGNOSIS — Z950 Presence of cardiac pacemaker: Secondary | ICD-10-CM | POA: Diagnosis not present

## 2019-07-23 DIAGNOSIS — R609 Edema, unspecified: Secondary | ICD-10-CM | POA: Diagnosis not present

## 2019-07-23 DIAGNOSIS — Z951 Presence of aortocoronary bypass graft: Secondary | ICD-10-CM | POA: Diagnosis not present

## 2019-07-23 DIAGNOSIS — I2510 Atherosclerotic heart disease of native coronary artery without angina pectoris: Secondary | ICD-10-CM | POA: Diagnosis not present

## 2019-07-23 DIAGNOSIS — J1282 Pneumonia due to coronavirus disease 2019: Secondary | ICD-10-CM | POA: Diagnosis not present

## 2019-07-23 DIAGNOSIS — I495 Sick sinus syndrome: Secondary | ICD-10-CM | POA: Diagnosis not present

## 2019-07-23 DIAGNOSIS — I10 Essential (primary) hypertension: Secondary | ICD-10-CM | POA: Diagnosis not present

## 2019-07-23 DIAGNOSIS — U071 COVID-19: Secondary | ICD-10-CM | POA: Diagnosis not present

## 2019-07-25 ENCOUNTER — Other Ambulatory Visit: Payer: Self-pay

## 2019-07-25 ENCOUNTER — Ambulatory Visit (INDEPENDENT_AMBULATORY_CARE_PROVIDER_SITE_OTHER): Payer: Medicare Other | Admitting: Dermatology

## 2019-07-25 VITALS — Wt 140.6 lb

## 2019-07-25 DIAGNOSIS — D1800 Hemangioma unspecified site: Secondary | ICD-10-CM | POA: Diagnosis not present

## 2019-07-25 DIAGNOSIS — L578 Other skin changes due to chronic exposure to nonionizing radiation: Secondary | ICD-10-CM | POA: Diagnosis not present

## 2019-07-25 DIAGNOSIS — L814 Other melanin hyperpigmentation: Secondary | ICD-10-CM | POA: Diagnosis not present

## 2019-07-25 DIAGNOSIS — L821 Other seborrheic keratosis: Secondary | ICD-10-CM

## 2019-07-25 DIAGNOSIS — L739 Follicular disorder, unspecified: Secondary | ICD-10-CM | POA: Diagnosis not present

## 2019-07-25 DIAGNOSIS — D225 Melanocytic nevi of trunk: Secondary | ICD-10-CM | POA: Diagnosis not present

## 2019-07-25 DIAGNOSIS — L820 Inflamed seborrheic keratosis: Secondary | ICD-10-CM | POA: Diagnosis not present

## 2019-07-25 DIAGNOSIS — Z1283 Encounter for screening for malignant neoplasm of skin: Secondary | ICD-10-CM

## 2019-07-25 DIAGNOSIS — D692 Other nonthrombocytopenic purpura: Secondary | ICD-10-CM | POA: Diagnosis not present

## 2019-07-25 DIAGNOSIS — D229 Melanocytic nevi, unspecified: Secondary | ICD-10-CM | POA: Diagnosis not present

## 2019-07-25 DIAGNOSIS — D2230 Melanocytic nevi of unspecified part of face: Secondary | ICD-10-CM

## 2019-07-25 NOTE — Progress Notes (Signed)
   Follow-Up Visit   Subjective  Ivan Salazar is a 86 y.o. male who presents for the following: Annual Exam (UBSE - patient has a lesion on his L cheek he would like to discuss. He doesn't want it treated with LN2, instead he would like to know what type of moisturizer he can use on it.). Patient presents for skin cancer screening today.  He is irritated by spot on his upper back.  He would like it treated today. He is not bothered much by the rash on his shoulders.  He says it comes and goes. The following portions of the chart were reviewed this encounter and updated as appropriate:     Review of Systems: No other skin or systemic complaints.  Objective  Well appearing patient in no apparent distress; mood and affect are within normal limits.  All skin waist up examined.  Objective  Face, trunk, extremities: Diffuse scaly erythematous macules with underlying dyspigmentation.   Objective  Back and shoulders: Pustules over the shoulders and chest  Objective  Trunk, extremities: Red papules.   Objective  Upper back spinal x 1: Erythematous keratotic or waxy stuck-on papule or plaque.   Objective  Face, trunk, extremities: Scattered tan macules.   Objective  Face, trunk, extremities: Tan-brown and/or pink-flesh-colored symmetric macules and papules.   Objective  B/L arms: Purple macules on the arms  Objective  Face, trunk, extremities: Stuck-on, waxy, tan-brown papule or plaque --Discussed benign etiology and prognosis.   Assessment & Plan  Actinic skin damage Face, trunk, extremities  Recommend daily broad spectrum sunscreen SPF 30+ to sun-exposed areas, reapply every 2 hours as needed. Call for new or changing lesions.   Folliculitis Back and shoulders  Pt declines prescription medication. I recommend OTC CLN acne wash daily to prevent new papules.   Hemangioma, unspecified site Trunk, extremities  The patient will observe these symptoms, and report  promptly any worsening or unexpected persistence.  If well, may return prn.   Inflamed seborrheic keratosis Upper back spinal x 1  Destruction of lesion - Upper back spinal x 1 Complexity: simple   Destruction method: cryotherapy   Informed consent: discussed and consent obtained   Timeout:  patient name, date of birth, surgical site, and procedure verified Lesion destroyed using liquid nitrogen: Yes   Region frozen until ice ball extended beyond lesion: Yes   Outcome: patient tolerated procedure well with no complications   Post-procedure details: wound care instructions given    Lentigines Face, trunk, extremities  Benign, observe.    Nevus Face, trunk, extremities  The patient will observe these symptoms, and report promptly any worsening or unexpected persistence.  If well, may return prn.   Other nonthrombocytopenic purpura (HCC) B/L arms  Benign, observe.    Seborrheic keratosis Face, trunk, extremities  Benign, observe.  Recommend Amlactin Rapid Relief Lotion daily for the L cheek spot.  The patient declines treatment of this with LN2.   Return in about 1 year (around 07/24/2020) for UBSE.   Tanya Nones, CMA, am acting as scribe for Sarina Ser, MD .

## 2019-07-26 ENCOUNTER — Telehealth: Payer: Medicare Other | Admitting: Family Medicine

## 2019-07-26 ENCOUNTER — Ambulatory Visit: Payer: Medicare Other | Attending: Internal Medicine

## 2019-07-26 DIAGNOSIS — Z23 Encounter for immunization: Secondary | ICD-10-CM

## 2019-07-26 NOTE — Progress Notes (Signed)
   Covid-19 Vaccination Clinic  Name:  CORNIE BURCK    MRN: [id number] DOB: [birth year]  07/26/2019  Mr. Pitt was observed post Covid-19 immunization for 15 minutes without incident. He was provided with Vaccine Information Sheet and instruction to access the V-Safe system.   Mr. Kopitzke was instructed to call 911 with any severe reactions post vaccine: Marland Kitchen Difficulty breathing  . Swelling of face and throat  . A fast heartbeat  . A bad rash all over body  . Dizziness and weakness   Immunizations Administered    Name Date Dose VIS Date Route   Pfizer COVID-19 Vaccine 07/26/2019 11:35 AM 0.3 mL 04/12/2019 Intramuscular   Manufacturer: [employer]   Lot: [id number]   [place]: [id number]

## 2019-07-29 DIAGNOSIS — L600 Ingrowing nail: Secondary | ICD-10-CM | POA: Diagnosis not present

## 2019-07-29 DIAGNOSIS — M2042 Other hammer toe(s) (acquired), left foot: Secondary | ICD-10-CM | POA: Diagnosis not present

## 2019-07-29 DIAGNOSIS — M79674 Pain in right toe(s): Secondary | ICD-10-CM | POA: Diagnosis not present

## 2019-07-29 DIAGNOSIS — B351 Tinea unguium: Secondary | ICD-10-CM | POA: Diagnosis not present

## 2019-07-29 DIAGNOSIS — M2041 Other hammer toe(s) (acquired), right foot: Secondary | ICD-10-CM | POA: Diagnosis not present

## 2019-07-29 DIAGNOSIS — M79675 Pain in left toe(s): Secondary | ICD-10-CM | POA: Diagnosis not present

## 2019-08-01 ENCOUNTER — Ambulatory Visit: Payer: Medicare Other

## 2019-08-03 ENCOUNTER — Other Ambulatory Visit: Payer: Self-pay | Admitting: Family Medicine

## 2019-08-03 NOTE — Telephone Encounter (Signed)
Requested Prescriptions  Pending Prescriptions Disp Refills  . clopidogrel (PLAVIX) 75 MG tablet [Pharmacy Med Name: CLOPIDOGREL 75 MG TABLET] 90 tablet 2    Sig: TAKE ONE TABLET BY MOUTH DAILY     Hematology: Antiplatelets - clopidogrel Failed - 08/03/2019  6:51 AM      Failed - Evaluate AST, ALT within 2 months of therapy initiation.      Failed - HCT in normal range and within 180 days    HCT  Date Value Ref Range Status  06/01/2019 33.4 (L) 39.0 - 52.0 % Final   Hematocrit  Date Value Ref Range Status  02/28/2019 34.3 (L) 37.5 - 51.0 % Final         Failed - HGB in normal range and within 180 days    Hemoglobin  Date Value Ref Range Status  06/01/2019 11.0 (L) 13.0 - 17.0 g/dL Final  11/29/2018 12.0 (L) 13.0 - 17.7 g/dL Final         Passed - ALT in normal range and within 360 days    ALT  Date Value Ref Range Status  05/30/2019 14 0 - 44 U/L Final         Passed - AST in normal range and within 360 days    AST  Date Value Ref Range Status  05/30/2019 29 15 - 41 U/L Final         Passed - PLT in normal range and within 180 days    Platelets  Date Value Ref Range Status  06/01/2019 239 150 - 400 K/uL Final  04/27/2018 161 150 - 450 x10E3/uL Final         Passed - Valid encounter within last 6 months    Recent Outpatient Visits          2 weeks ago Complaining of cold hands   [place], Dionne Bucy, MD   1 month ago Pneumonia due to COVID-19 virus   [facility], Dionne Bucy, MD   2 months ago DDD (degenerative disc disease), lumbosacral   [facility] Birdie Sons, MD   4 months ago Acute non-recurrent pansinusitis   [place], [place], [place]   5 months ago Rash of back   [place], Kirstie Peri, MD

## 2019-08-08 ENCOUNTER — Ambulatory Visit (INDEPENDENT_AMBULATORY_CARE_PROVIDER_SITE_OTHER): Payer: Medicare Other | Admitting: *Deleted

## 2019-08-08 ENCOUNTER — Other Ambulatory Visit: Payer: Self-pay

## 2019-08-08 ENCOUNTER — Encounter: Payer: Self-pay | Admitting: *Deleted

## 2019-08-08 DIAGNOSIS — E291 Testicular hypofunction: Secondary | ICD-10-CM

## 2019-08-08 MED ORDER — TESTOSTERONE CYPIONATE 200 MG/ML IM SOLN
200.0000 mg | Freq: Once | INTRAMUSCULAR | Status: AC
  Administered 2019-08-08: 200 mg via INTRAMUSCULAR

## 2019-08-08 NOTE — Progress Notes (Signed)
Testosterone IM Injection  Due to Hypogonadism patient is present today for a Testosterone Injection.  Medication: Testosterone Cypionate Dose:1 ml Location: right upper outer buttocks Lot: [id number] Exp:03/2021  Patient tolerated well, no complications were noted  Performed by: Verlene Mayer, CMA  Follow up: Lab appointment scheduled and Follow up with Zara Council, PA scheduled.

## 2019-08-10 ENCOUNTER — Other Ambulatory Visit: Payer: Self-pay | Admitting: Family Medicine

## 2019-08-10 NOTE — Telephone Encounter (Signed)
Requested Prescriptions  Pending Prescriptions Disp Refills  . enalapril (VASOTEC) 5 MG tablet [Pharmacy Med Name: ENALAPRIL MALEATE 5 MG TABLET] 90 tablet 3    Sig: TAKE ONE TABLET BY MOUTH DAILY     Cardiovascular:  ACE Inhibitors Passed - 08/10/2019  6:51 AM      Passed - Cr in normal range and within 180 days    Creatinine  Date Value Ref Range Status  05/05/2011 1.18 0.60 - 1.30 mg/dL Final   Creatinine, Ser  Date Value Ref Range Status  06/01/2019 0.95 0.61 - 1.24 mg/dL Final         Passed - K in normal range and within 180 days    Potassium  Date Value Ref Range Status  06/01/2019 4.5 3.5 - 5.1 mmol/L Final  05/05/2011 5.3 (H) 3.5 - 5.1 mmol/L Final         Passed - Patient is not pregnant      Passed - Last BP in normal range    BP Readings from Last 1 Encounters:  07/19/19 103/60         Passed - Valid encounter within last 6 months    Recent Outpatient Visits          3 weeks ago Complaining of cold hands   [facility] [place], Dionne Bucy, MD   2 months ago Pneumonia due to COVID-19 virus   [facility], Dionne Bucy, MD   2 months ago DDD (degenerative disc disease), lumbosacral   [facility] Birdie Sons, MD   4 months ago Acute non-recurrent pansinusitis   [place], [place]   5 months ago Rash of back   [place], Kirstie Peri, MD      Future Appointments            In 3 weeks [place], Gordan [facility]

## 2019-08-20 DIAGNOSIS — I495 Sick sinus syndrome: Secondary | ICD-10-CM | POA: Diagnosis not present

## 2019-08-29 ENCOUNTER — Other Ambulatory Visit: Payer: Medicare Other

## 2019-08-29 ENCOUNTER — Other Ambulatory Visit: Payer: Self-pay

## 2019-08-29 DIAGNOSIS — N401 Enlarged prostate with lower urinary tract symptoms: Secondary | ICD-10-CM | POA: Diagnosis not present

## 2019-08-29 DIAGNOSIS — N138 Other obstructive and reflux uropathy: Secondary | ICD-10-CM

## 2019-08-29 DIAGNOSIS — E291 Testicular hypofunction: Secondary | ICD-10-CM

## 2019-08-30 LAB — PSA: Prostate Specific Ag, Serum: 0.4 ng/mL (ref 0.0–4.0)

## 2019-08-30 LAB — HEMOGLOBIN: Hemoglobin: 10.5 g/dL — ABNORMAL LOW (ref 13.0–17.7)

## 2019-08-30 LAB — HEMATOCRIT: Hematocrit: 33.1 % — ABNORMAL LOW (ref 37.5–51.0)

## 2019-08-30 LAB — TESTOSTERONE: Testosterone: 190 ng/dL — ABNORMAL LOW (ref 264–916)

## 2019-09-05 ENCOUNTER — Encounter: Payer: Self-pay | Admitting: Urology

## 2019-09-05 ENCOUNTER — Other Ambulatory Visit: Payer: Self-pay

## 2019-09-05 ENCOUNTER — Other Ambulatory Visit: Payer: Self-pay | Admitting: Family Medicine

## 2019-09-05 ENCOUNTER — Ambulatory Visit (INDEPENDENT_AMBULATORY_CARE_PROVIDER_SITE_OTHER): Payer: Medicare Other | Admitting: Urology

## 2019-09-05 VITALS — BP 144/75 | HR 83 | Ht 70.0 in | Wt 137.0 lb

## 2019-09-05 DIAGNOSIS — N401 Enlarged prostate with lower urinary tract symptoms: Secondary | ICD-10-CM

## 2019-09-05 DIAGNOSIS — E349 Endocrine disorder, unspecified: Secondary | ICD-10-CM

## 2019-09-05 DIAGNOSIS — N138 Other obstructive and reflux uropathy: Secondary | ICD-10-CM

## 2019-09-05 MED ORDER — TESTOSTERONE CYPIONATE 200 MG/ML IM SOLN
200.0000 mg | Freq: Once | INTRAMUSCULAR | Status: AC
  Administered 2019-09-05: 200 mg via INTRAMUSCULAR

## 2019-09-05 NOTE — Progress Notes (Signed)
09/05/2019  11:17 AM   Ivan Salazar November 27, 1931 [id number]  Referring provider: Birdie Sons, [place] [address] [place],  [place] [address]  Chief Complaint  Patient presents with  . Benign Prostatic Hypertrophy  . Hypogonadism   HPI: Ivan Salazar is a 86 y.o.  male with BPH with LU TS and testosterone deficiency who presents today for follow up.   BPH WITH LUTS  (prostate and/or bladder) IPSS score: 7/2      Previous score: 6/1   Previous PVR: 45 mL  Major complaint(s):  Nocturia x 2, intermittency, weak stream and straining to urinate.  Denies any dysuria, hematuria or suprapubic pain.  Denies any recent fevers, chills, nausea or vomiting.  IPSS    Row Name 09/05/19 1100         International Prostate Symptom Score   How often have you had the sensation of not emptying your bladder?  Not at All     How often have you had to urinate less than every two hours?  Not at All     How often have you found you stopped and started again several times when you urinated?  Less than half the time     How often have you found it difficult to postpone urination?  Not at All     How often have you had a weak urinary stream?  Less than half the time     How often have you had to strain to start urination?  Less than 1 in 5 times     How many times did you typically get up at night to urinate?  2 Times     Total IPSS Score  7       Quality of Life due to urinary symptoms   If you were to spend the rest of your life with your urinary condition just the way it is now how would you feel about that?  Mostly Satisfied        Score:  1-7 Mild 8-19 Moderate 20-35 Severe   Testosterone deficiency He is no longer having spontaneous erections at night.   He does not have sleep apnea.   He is currently managing his testosterone deficiency with testosterone cypionate 200 mg IM, 1 cc every 28 days  Component     Latest Ref Rng & Units 08/29/2019  Testosterone     264 - 916  ng/dL 190 (L)    Component     Latest Ref Rng & Units 08/29/2019          HCT     37.5 - 51.0 % 33.1 (L)   Component     Latest Ref Rng & Units 08/29/2019          Hemoglobin     13.0 - 17.7 g/dL 10.5 (L)    PMH: Past Medical History:  Diagnosis Date  . BPH (benign prostatic hyperplasia)   . CAD (coronary artery disease)   . Carpal tunnel syndrome   . DDD (degenerative disc disease), cervical   . Environmental and seasonal allergies   . GERD (gastroesophageal reflux disease)   . Glaucoma   . Impotence   . Nocturia   . Presence of permanent cardiac pacemaker   . Weight loss    Surgical History: Past Surgical History:  Procedure Laterality Date  . APPENDECTOMY    . BALLOON DILATION N/A 11/05/2018   Procedure: BALLOON DILATION;  Surgeon: Manya Silvas, MD;  Location[facility]  ENDOSCOPY;  Service: Endoscopy;  Laterality: N/A;  . BILATERAL CARPAL TUNNEL RELEASE     05/12/2011, 06/09/2011  . CARDIAC CATHETERIZATION  2004  . coils in vessel     6 coils  . CORONARY ANGIOPLASTY     x 5  . CORONARY ARTERY BYPASS GRAFT  2007   5; [facility]  . CORONARY STENT PLACEMENT  2005  . ESOPHAGOGASTRODUODENOSCOPY (EGD) WITH PROPOFOL N/A 10/28/2015   Procedure: ESOPHAGOGASTRODUODENOSCOPY (EGD) WITH PROPOFOL;  Surgeon: Manya Silvas, MD;  Location: [facility] ENDOSCOPY;  Service: Endoscopy;  Laterality: N/A;  . ESOPHAGOGASTRODUODENOSCOPY (EGD) WITH PROPOFOL N/A 11/05/2018   Procedure: ESOPHAGOGASTRODUODENOSCOPY (EGD) WITH PROPOFOL;  Surgeon: Manya Silvas, MD;  Location: [facility] ENDOSCOPY;  Service: Endoscopy;  Laterality: N/A;  . KIDNEY STONE SURGERY    . PACEMAKER INSERTION N/A 07/13/2017   Procedure: INSERTION PACEMAKER;  Surgeon: Isaias Cowman, MD;  Location: [facility] ORS;  Service: Cardiovascular;  Laterality: N/A;  . SHOULDER ARTHROSCOPY W/ ROTATOR CUFF REPAIR Bilateral   . TONSILLECTOMY    . UPPER GI ENDOSCOPY  08/02/2010   Dr. Tedra Coupe, gastritis. H Pylori negative   Home  Medications:  Allergies as of 09/05/2019      Reactions   Celebrex [celecoxib] Other (See Comments)   Foul taste in mouth   Mirtazapine Other (See Comments)   Foul taste in mouth      Medication List       Accurate as of Sep 05, 2019 11:17 AM. If you have any questions, ask your nurse or doctor.        atorvastatin 80 MG tablet Commonly known as: LIPITOR TAKE ONE TABLET BY MOUTH DAILY What changed: when to take this   clopidogrel 75 MG tablet Commonly known as: PLAVIX TAKE ONE TABLET BY MOUTH DAILY   enalapril 5 MG tablet Commonly known as: VASOTEC TAKE ONE TABLET BY MOUTH DAILY   fluticasone 50 MCG/ACT nasal spray Commonly known as: FLONASE SPRAY TWO SPRAYS IN EACH NOSTRIL ONCE DAILY   GaviLyte-N with Flavor Pack 420 g solution Generic drug: polyethylene glycol-electrolytes GaviLyte-N 420 gram oral solution   guaiFENesin-codeine 100-10 MG/5ML syrup TAKE ONE TO TWO TEASPOONSFUL (5-10 MILLILITERS) BY MOUTH EVERY SIX HOURS AS NEEDED FOR COUGH   HYDROcodone-acetaminophen 7.5-325 MG tablet Commonly known as: NORCO 1 tablet every 4-6 hours as needed   latanoprost 0.005 % ophthalmic solution Commonly known as: XALATAN Place 1 drop into both eyes at bedtime.   levocetirizine 5 MG tablet Commonly known as: Xyzal Take 1 tablet (5 mg total) by mouth every evening.   metoprolol succinate 25 MG 24 hr tablet Commonly known as: TOPROL-XL TAKE ONE TABLET BY MOUTH DAILY   mupirocin ointment 2 % Commonly known as: BACTROBAN   testosterone cypionate 200 MG/ML injection Commonly known as: DEPOTESTOSTERONE CYPIONATE Inject 1 mL (200 mg total) into the muscle every 28 (twenty-eight) days.   zolpidem 10 MG tablet Commonly known as: AMBIEN Take 0.5-1 tablets (5-10 mg total) by mouth at bedtime. TAKE 1/2 TO 1 TABLET EVERY NIGHT AT BEDTIME      Allergies:  Allergies  Allergen Reactions  . Celebrex [Celecoxib] Other (See Comments)    Foul taste in mouth   . Mirtazapine  Other (See Comments)    Foul taste in mouth   Family History: Family History  Problem Relation Age of Onset  . Cirrhosis Father        of liver  . Cancer Daughter   . Kidney disease Neg Hx   . Prostate  cancer Neg Hx   . Kidney cancer Neg Hx   . Bladder Cancer Neg Hx    Social History:  reports that he quit smoking about 42 years ago. He has a 10.00 pack-year smoking history. He has never used smokeless tobacco. He reports current alcohol use of about 7.0 - 14.0 standard drinks of alcohol per week. He reports that he does not use drugs.  ROS: For pertinent review of systems please refer to history of present illness  Physical Exam: BP (!) 144/75   Pulse 83   Ht 5\' 10"  (1.778 m)   Wt 137 lb (62.1 kg)   BMI 19.66 kg/m   Constitutional:  Well nourished. Alert and oriented, No acute distress. HEENT: [place] AT, moist mucus membranes.  Trachea midline. Cardiovascular: No clubbing, cyanosis, or edema. Respiratory: Normal respiratory effort, no increased work of breathing. GI: Abdomen is soft, non tender, non distended, no abdominal masses.  GU: No CVA tenderness.  No bladder fullness or masses.  Patient with circumcised phallus.  Urethral meatus is patent.  No penile discharge. No penile lesions or rashes. Scrotum without lesions, cysts, rashes and/or edema.  Testicles are located scrotally bilaterally. No masses are appreciated in the testicles. Left and right epididymis are normal. Rectal: Patient with  normal sphincter tone. Anus and perineum without scarring or rashes. No rectal masses are appreciated. Prostate is approximately 50 grams, no nodules are appreciated. Seminal vesicles are normal. Skin: No rashes, bruises or suspicious lesions. Lymph: No inguinal adenopathy. Neurologic: Grossly intact, no focal deficits, moving all 4 extremities. Psychiatric: Normal mood and affect.  Laboratory Data: Lab Results  Component Value Date   WBC 5.9 06/01/2019   HGB 10.5 (L) 08/29/2019   HCT  33.1 (L) 08/29/2019   MCV 92.5 06/01/2019   PLT 239 06/01/2019   Lab Results  Component Value Date   CREATININE 0.95 06/01/2019   Lab Results  Component Value Date   TESTOSTERONE 190 (L) 08/29/2019   Results for orders placed or performed in visit on 08/29/19  PSA  Result Value Ref Range   Prostate Specific Ag, Serum 0.4 0.0 - 4.0 ng/mL  Hemoglobin  Result Value Ref Range   Hemoglobin 10.5 (L) 13.0 - 17.7 g/dL  Testosterone  Result Value Ref Range   Testosterone 190 (L) 264 - 916 ng/dL  Hematocrit  Result Value Ref Range   Hematocrit 33.1 (L) 37.5 - 51.0 %    Pertinent Imaging No recent imaging  Assessment & Plan:    1.Testosterone deficiency  Continue testosterone cypionate 200 mg IM , 1 cc every 28 days RTC in 3 months for HCT/HBG, testosterone one week after an injection  2. BPH with LUTS IPSS score is 7/2, it is slightly worse Continue conservative management, avoiding bladder irritants and timed voiding's RTC in 6 months for IPSS, PSA and exam   Return for three months for blood work and then 6 months for office visit .  Zara Council, PA-C [facility] [address], [place],  [address] [phone]

## 2019-09-09 ENCOUNTER — Other Ambulatory Visit: Payer: Self-pay | Admitting: Family Medicine

## 2019-09-09 DIAGNOSIS — M5137 Other intervertebral disc degeneration, lumbosacral region: Secondary | ICD-10-CM

## 2019-09-09 MED ORDER — HYDROCODONE-ACETAMINOPHEN 7.5-325 MG PO TABS: ORAL_TABLET | ORAL | 0 refills | Status: DC

## 2019-09-12 ENCOUNTER — Telehealth: Payer: Self-pay | Admitting: Family Medicine

## 2019-09-12 NOTE — Telephone Encounter (Signed)
This was already done earlier this week. They should be on my work desk or at the front desk.

## 2019-09-12 NOTE — Telephone Encounter (Signed)
Pt called and is requesting to have his 3 month written prescriptions for hydrocodone available for him to pick up in the office. Please advise.

## 2019-09-13 NOTE — Telephone Encounter (Signed)
Patient picked up RX.

## 2019-09-14 ENCOUNTER — Emergency Department: Payer: Medicare Other

## 2019-09-14 ENCOUNTER — Other Ambulatory Visit: Payer: Self-pay

## 2019-09-14 ENCOUNTER — Emergency Department
Admission: EM | Admit: 2019-09-14 | Discharge: 2019-09-14 | Disposition: A | Discharge status: Home / Self Care | Payer: Medicare Other | Source: Home / Self Care | Attending: Emergency Medicine | Admitting: Emergency Medicine

## 2019-09-14 DIAGNOSIS — R58 Hemorrhage, not elsewhere classified: Secondary | ICD-10-CM | POA: Diagnosis not present

## 2019-09-14 DIAGNOSIS — S199XXA Unspecified injury of neck, initial encounter: Secondary | ICD-10-CM | POA: Diagnosis not present

## 2019-09-14 DIAGNOSIS — Y9301 Activity, walking, marching and hiking: Secondary | ICD-10-CM | POA: Insufficient documentation

## 2019-09-14 DIAGNOSIS — S80212A Abrasion, left knee, initial encounter: Secondary | ICD-10-CM | POA: Insufficient documentation

## 2019-09-14 DIAGNOSIS — I2510 Atherosclerotic heart disease of native coronary artery without angina pectoris: Secondary | ICD-10-CM | POA: Insufficient documentation

## 2019-09-14 DIAGNOSIS — Y92480 Sidewalk as the place of occurrence of the external cause: Secondary | ICD-10-CM | POA: Insufficient documentation

## 2019-09-14 DIAGNOSIS — I10 Essential (primary) hypertension: Secondary | ICD-10-CM | POA: Insufficient documentation

## 2019-09-14 DIAGNOSIS — G459 Transient cerebral ischemic attack, unspecified: Secondary | ICD-10-CM | POA: Diagnosis not present

## 2019-09-14 DIAGNOSIS — I959 Hypotension, unspecified: Secondary | ICD-10-CM | POA: Diagnosis not present

## 2019-09-14 DIAGNOSIS — Z8616 Personal history of COVID-19: Secondary | ICD-10-CM | POA: Diagnosis not present

## 2019-09-14 DIAGNOSIS — R4701 Aphasia: Secondary | ICD-10-CM | POA: Diagnosis not present

## 2019-09-14 DIAGNOSIS — W101XXA Fall (on)(from) sidewalk curb, initial encounter: Secondary | ICD-10-CM | POA: Insufficient documentation

## 2019-09-14 DIAGNOSIS — Y998 Other external cause status: Secondary | ICD-10-CM | POA: Insufficient documentation

## 2019-09-14 DIAGNOSIS — N179 Acute kidney failure, unspecified: Secondary | ICD-10-CM | POA: Diagnosis not present

## 2019-09-14 DIAGNOSIS — R9431 Abnormal electrocardiogram [ECG] [EKG]: Secondary | ICD-10-CM | POA: Diagnosis not present

## 2019-09-14 DIAGNOSIS — Z951 Presence of aortocoronary bypass graft: Secondary | ICD-10-CM | POA: Insufficient documentation

## 2019-09-14 DIAGNOSIS — W19XXXA Unspecified fall, initial encounter: Secondary | ICD-10-CM | POA: Diagnosis not present

## 2019-09-14 DIAGNOSIS — I639 Cerebral infarction, unspecified: Secondary | ICD-10-CM | POA: Diagnosis not present

## 2019-09-14 DIAGNOSIS — J9602 Acute respiratory failure with hypercapnia: Secondary | ICD-10-CM | POA: Diagnosis not present

## 2019-09-14 DIAGNOSIS — Z03818 Encounter for observation for suspected exposure to other biological agents ruled out: Secondary | ICD-10-CM | POA: Diagnosis not present

## 2019-09-14 DIAGNOSIS — J9601 Acute respiratory failure with hypoxia: Secondary | ICD-10-CM | POA: Diagnosis not present

## 2019-09-14 DIAGNOSIS — S0990XA Unspecified injury of head, initial encounter: Secondary | ICD-10-CM | POA: Diagnosis not present

## 2019-09-14 DIAGNOSIS — Z79899 Other long term (current) drug therapy: Secondary | ICD-10-CM | POA: Insufficient documentation

## 2019-09-14 DIAGNOSIS — Z87891 Personal history of nicotine dependence: Secondary | ICD-10-CM | POA: Insufficient documentation

## 2019-09-14 DIAGNOSIS — G40409 Other generalized epilepsy and epileptic syndromes, not intractable, without status epilepticus: Secondary | ICD-10-CM | POA: Diagnosis not present

## 2019-09-14 DIAGNOSIS — S0081XA Abrasion of other part of head, initial encounter: Secondary | ICD-10-CM

## 2019-09-14 DIAGNOSIS — G9341 Metabolic encephalopathy: Secondary | ICD-10-CM | POA: Diagnosis not present

## 2019-09-14 HISTORY — DX: Unspecified fall, initial encounter: W19.XXXA

## 2019-09-14 MED ORDER — MUPIROCIN CALCIUM 2 % EX CREA: TOPICAL_CREAM | CUTANEOUS | 0 refills | Status: DC

## 2019-09-14 NOTE — ED Provider Notes (Signed)
[facility] Emergency Department Provider Note  ____________________________________________  Time seen: Approximately 12:01 PM  I have reviewed the triage vital signs and the nursing notes.   HISTORY  Chief Complaint Fall    HPI Ivan Salazar is a 86 y.o. male who presents the emergency department via EMS from home after mechanical fall.  Patient was walking, tripped over a raised curb causing him to fall.  Patient states that he struck his left knee on the curb and struck his right head on a grassy surface.  Patient reports abrasions to both his forehead and his left knee.  He reports minimal pain at this time.  He is on Plavix.  No headache, visual changes, neck pain, chest pain, shortness of breath.  Patient has a history of BPH, CAD, degenerative disc disease, GERD, anemia.  Denies any complaints with chronic medical problems.         Past Medical History:  Diagnosis Date  . BPH (benign prostatic hyperplasia)   . CAD (coronary artery disease)   . Carpal tunnel syndrome   . DDD (degenerative disc disease), cervical   . Environmental and seasonal allergies   . Fall 09/14/2019   Fell on curb, injured left knee and hit right forehead  . GERD (gastroesophageal reflux disease)   . Glaucoma   . Impotence   . Nocturia   . Presence of permanent cardiac pacemaker   . Weight loss     Patient Active Problem List   Diagnosis Date Noted  . Pneumonia due to COVID-19 virus 05/28/2019  . Stiffness of shoulder joint 03/14/2019  . Status post cardiac pacemaker procedure 07/13/2017  . Mitral insufficiency 06/17/2017  . OA (osteoarthritis) of neck 11/01/2016  . Eustachian tube dysfunction 09/03/2015  . Schatzki's ring 09/03/2015  . Adenomatous polyp of colon 09/03/2015  . Hypogonadism in male 03/09/2015  . BPH with obstruction/lower urinary tract symptoms 03/09/2015  . Loss of weight 03/05/2015  . Actinic keratoses 10/22/2014  . DDD (degenerative disc  disease), lumbosacral 10/22/2014  . Acid reflux 10/22/2014  . Testicular hypofunction 06/27/2012  . Bursitis of hip 01/18/2011  . Family history of colonic polyps 12/24/2007  . CAD in native artery 08/16/2006  . H/O coronary artery bypass surgery 08/16/2006  . Anemia, iron deficiency 09/23/2005  . Cardiac enlargement 07/20/2005  . HLD (hyperlipidemia) 05/02/1998  . Insomnia, persistent 05/02/1998  . Essential (primary) hypertension 05/02/1998    Past Surgical History:  Procedure Laterality Date  . APPENDECTOMY    . BALLOON DILATION N/A 11/05/2018   Procedure: BALLOON DILATION;  Surgeon: Manya Silvas, MD;  Location: [facility] ENDOSCOPY;  Service: Endoscopy;  Laterality: N/A;  . BILATERAL CARPAL TUNNEL RELEASE     05/12/2011, 06/09/2011  . CARDIAC CATHETERIZATION  2004  . coils in vessel     6 coils  . CORONARY ANGIOPLASTY     x 5  . CORONARY ARTERY BYPASS GRAFT  2007   5; [facility]  . CORONARY STENT PLACEMENT  2005  . ESOPHAGOGASTRODUODENOSCOPY (EGD) WITH PROPOFOL N/A 10/28/2015   Procedure: ESOPHAGOGASTRODUODENOSCOPY (EGD) WITH PROPOFOL;  Surgeon: Manya Silvas, MD;  Location: [facility] ENDOSCOPY;  Service: Endoscopy;  Laterality: N/A;  . ESOPHAGOGASTRODUODENOSCOPY (EGD) WITH PROPOFOL N/A 11/05/2018   Procedure: ESOPHAGOGASTRODUODENOSCOPY (EGD) WITH PROPOFOL;  Surgeon: Manya Silvas, MD;  Location: [facility] ENDOSCOPY;  Service: Endoscopy;  Laterality: N/A;  . KIDNEY STONE SURGERY    . PACEMAKER INSERTION N/A 07/13/2017   Procedure: INSERTION PACEMAKER;  Surgeon: Isaias Cowman, MD;  Location: [facility] ORS;  Service: Cardiovascular;  Laterality: N/A;  . SHOULDER ARTHROSCOPY W/ ROTATOR CUFF REPAIR Bilateral   . TONSILLECTOMY    . UPPER GI ENDOSCOPY  08/02/2010   Dr. Tedra Coupe, gastritis. H Pylori negative    Prior to Admission medications   Medication Sig Start Date End Date Taking? Authorizing Provider  atorvastatin (LIPITOR) 80 MG tablet TAKE ONE TABLET BY MOUTH DAILY Patient taking  differently: Take 80 mg by mouth daily at 6 PM.  01/02/19   Birdie Sons, MD  clopidogrel (PLAVIX) 75 MG tablet TAKE ONE TABLET BY MOUTH DAILY 08/03/19   Birdie Sons, MD  enalapril (VASOTEC) 5 MG tablet TAKE ONE TABLET BY MOUTH DAILY 08/10/19   Birdie Sons, MD  fluticasone (FLONASE) 50 MCG/ACT nasal spray SPRAY TWO SPRAYS IN EACH NOSTRIL ONCE DAILY 04/14/19   Birdie Sons, MD  guaiFENesin-codeine 100-10 MG/5ML syrup TAKE ONE TO TWO TEASPOONSFUL (5-10 MILLILITERS) BY MOUTH EVERY SIX HOURS AS NEEDED FOR COUGH 07/16/19   Birdie Sons, MD  HYDROcodone-acetaminophen [facility]) 7.5-325 MG tablet 1 tablet every 4-6 hours as needed 09/09/19   Birdie Sons, MD  latanoprost (XALATAN) 0.005 % ophthalmic solution Place 1 drop into both eyes at bedtime.     [provider]  levocetirizine (XYZAL) 5 MG tablet Take 1 tablet (5 mg total) by mouth every evening. 03/06/19   Birdie Sons, MD  metoprolol succinate (TOPROL-XL) 25 MG 24 hr tablet TAKE ONE TABLET BY MOUTH DAILY Patient taking differently: Take 25 mg by mouth daily.  10/28/18   Birdie Sons, MD  mupirocin cream Drue Stager) 2 % Apply to affected area 3 times daily 09/14/19   Cuthriell, Charline Bills, PA-C  mupirocin ointment (BACTROBAN) 2 %  05/28/18   [provider]  polyethylene glycol-electrolytes (GAVILYTE-N WITH FLAVOR PACK) 420 g solution GaviLyte-N 420 gram oral solution 05/24/18   [provider]  testosterone cypionate (DEPOTESTOSTERONE CYPIONATE) 200 MG/ML injection Inject 1 mL (200 mg total) into the muscle every 28 (twenty-eight) days. 07/04/19   McGowan, Larene Beach A, PA-C  zolpidem (AMBIEN) 10 MG tablet Take 0.5-1 tablets (5-10 mg total) by mouth at bedtime. TAKE 1/2 TO 1 TABLET EVERY NIGHT AT BEDTIME 07/05/19   Birdie Sons, MD    Allergies Celebrex [celecoxib] and Mirtazapine  Family History  Problem Relation Age of Onset  . Cirrhosis Father        of liver  . Cancer Daughter   . Kidney  disease Neg Hx   . Prostate cancer Neg Hx   . Kidney cancer Neg Hx   . Bladder Cancer Neg Hx     Social History Social History   Tobacco Use  . Smoking status: Former Smoker    Packs/day: 0.50    Years: 20.00    Pack years: 10.00    Quit date: 05/01/1977    Years since quitting: 42.4  . Smokeless tobacco: Never Used  Substance Use Topics  . Alcohol use: Yes    Alcohol/week: 7.0 - 14.0 standard drinks    Types: 7 - 14 Shots of liquor per week    Comment: moderate; 1-2 drinks daily  . Drug use: No     Review of Systems  Constitutional: No fever/chills.  Positive for fall striking his head. Eyes: No visual changes. No discharge ENT: No upper respiratory complaints. Cardiovascular: no chest pain. Respiratory: no cough. No SOB. Gastrointestinal: No abdominal pain.  No nausea, no vomiting.  No diarrhea.  No  constipation. Genitourinary: Negative for dysuria. No hematuria Musculoskeletal: Minimal left knee pain after a fall Skin: Abrasions to the forehead and left knee Neurological: Negative for headaches, focal weakness or numbness. 10-point ROS otherwise negative.  ____________________________________________   PHYSICAL EXAM:  VITAL SIGNS: ED Triage Vitals  Enc Vitals Group     BP --      Pulse --      Resp --      Temp --      Temp src --      SpO2 09/14/19 1151 100 %     Weight 09/14/19 1158 137 lb (62.1 kg)     Height 09/14/19 1158 5\' 10"  (1.778 m)     Head Circumference --      Peak Flow --      Pain Score 09/14/19 1155 1     Pain Loc --      Pain Edu? --      Excl. in [place]? --      Constitutional: Alert and oriented. Well appearing and in no acute distress. Eyes: Conjunctivae are normal. PERRL. EOMI. Head: Superficial abrasion on the right forehead.  No visible foreign body.  No deep lacerations.  No gross edema.  Area is mildly tender to palpation but no underlying palpable abnormality.  No crepitus.  No battle signs, no raccoon eyes.  No  serosanguineous fluid drainage from the ears or nares. ENT:      Ears:       Nose: No congestion/rhinnorhea.      Mouth/Throat: Mucous membranes are moist.  Neck: No stridor.  No cervical spine tenderness to palpation  Cardiovascular: Normal rate, regular rhythm. Normal S1 and S2.  Good peripheral circulation. Respiratory: Normal respiratory effort without tachypnea or retractions. Lungs CTAB. Good air entry to the bases with no decreased or absent breath sounds. Gastrointestinal: Bowel sounds 4 quadrants. Soft and nontender to palpation. No guarding or rigidity. No palpable masses. No distention. No CVA tenderness. Musculoskeletal: Full range of motion to all extremities. No gross deformities appreciated.  Visualization of the left knee reveals superficial abrasions.  No deformity.  Good range of motion.  Patient is tender over the areas of abrasion but no underlying palpable abnormality.  Varus, valgus, Lachman's, McMurray's is negative.  Dorsalis pedis pulse intact distally.  Examination of the hip and ankle is unremarkable. Neurologic:  Normal speech and language. No gross focal neurologic deficits are appreciated.  Skin:  Skin is warm, dry and intact. No rash noted. Psychiatric: Mood and affect are normal. Speech and behavior are normal. Patient exhibits appropriate insight and judgement.   ____________________________________________   LABS (all labs ordered are listed, but only abnormal results are displayed)  Labs Reviewed - No data to display ____________________________________________  EKG   ____________________________________________  RADIOLOGY I personally viewed and evaluated these images as part of my medical decision making, as well as reviewing the written report by the radiologist.  CT Head Wo Contrast  Result Date: 09/14/2019 CLINICAL DATA:  Patient tripped and fell. EXAM: CT HEAD WITHOUT CONTRAST CT CERVICAL SPINE WITHOUT CONTRAST TECHNIQUE: Multidetector CT  imaging of the head and cervical spine was performed following the standard protocol without intravenous contrast. Multiplanar CT image reconstructions of the cervical spine were also generated. COMPARISON:  None. FINDINGS: CT HEAD FINDINGS Brain: No evidence of acute infarction, hemorrhage, hydrocephalus, extra-axial collection or mass lesion/mass effect. There is ventricular and sulcal enlargement reflecting mild diffuse atrophy. Patchy areas of hypoattenuation are noted in the white matter  consistent with mild chronic microvascular ischemic change. Vascular: No hyperdense vessel or unexpected calcification. Skull: Normal. Negative for fracture or focal lesion. Sinuses/Orbits: No acute finding. Other: None. CT CERVICAL SPINE FINDINGS Alignment: Degenerative grade 1 anterolisthesis of C3 on C4. No other spondylolisthesis. Reversed cervical lordosis, apex at C4-C5. Skull base and vertebrae: No acute fracture. No primary bone lesion or focal pathologic process. Soft tissues and spinal canal: No prevertebral fluid or swelling. No visible canal hematoma. Disc levels: Severe loss of disc height at C3-C4, C4-C5, C5-C6 and C6-C7 with mild endplate spurring and spondylotic disc bulging. There are significant facet degenerative changes at are greatest on the right at C3-C4. No convincing disc herniation. Upper chest: No acute findings. Other: None. IMPRESSION: HEAD CT 1. No acute intracranial abnormalities. 2. Atrophy and mild chronic microvascular ischemic change. CERVICAL CT 1. No fracture or acute finding. 2. Significant degenerative changes as detailed above. Electronically Signed   By: Lajean Manes M.D.   On: 09/14/2019 13:31   CT Cervical Spine Wo Contrast  Result Date: 09/14/2019 CLINICAL DATA:  Patient tripped and fell. EXAM: CT HEAD WITHOUT CONTRAST CT CERVICAL SPINE WITHOUT CONTRAST TECHNIQUE: Multidetector CT imaging of the head and cervical spine was performed following the standard protocol without  intravenous contrast. Multiplanar CT image reconstructions of the cervical spine were also generated. COMPARISON:  None. FINDINGS: CT HEAD FINDINGS Brain: No evidence of acute infarction, hemorrhage, hydrocephalus, extra-axial collection or mass lesion/mass effect. There is ventricular and sulcal enlargement reflecting mild diffuse atrophy. Patchy areas of hypoattenuation are noted in the white matter consistent with mild chronic microvascular ischemic change. Vascular: No hyperdense vessel or unexpected calcification. Skull: Normal. Negative for fracture or focal lesion. Sinuses/Orbits: No acute finding. Other: None. CT CERVICAL SPINE FINDINGS Alignment: Degenerative grade 1 anterolisthesis of C3 on C4. No other spondylolisthesis. Reversed cervical lordosis, apex at C4-C5. Skull base and vertebrae: No acute fracture. No primary bone lesion or focal pathologic process. Soft tissues and spinal canal: No prevertebral fluid or swelling. No visible canal hematoma. Disc levels: Severe loss of disc height at C3-C4, C4-C5, C5-C6 and C6-C7 with mild endplate spurring and spondylotic disc bulging. There are significant facet degenerative changes at are greatest on the right at C3-C4. No convincing disc herniation. Upper chest: No acute findings. Other: None. IMPRESSION: HEAD CT 1. No acute intracranial abnormalities. 2. Atrophy and mild chronic microvascular ischemic change. CERVICAL CT 1. No fracture or acute finding. 2. Significant degenerative changes as detailed above. Electronically Signed   By: Lajean Manes M.D.   On: 09/14/2019 13:31   DG Knee Complete 4 Views Left  Result Date: 09/14/2019 CLINICAL DATA:  Fall, injury, abrasions EXAM: LEFT KNEE - COMPLETE 4+ VIEW COMPARISON:  02/13/2008 FINDINGS: Mild degenerative changes and chondrocalcinosis. Normal alignment. No acute osseous finding or fracture. Peripheral atherosclerosis present. IMPRESSION: Chondrocalcinosis and mild degenerative changes. No acute osseous  finding or effusion. Peripheral atherosclerosis Electronically Signed   By: Jerilynn Mages.  Shick M.D.   On: 09/14/2019 12:54    ____________________________________________    PROCEDURES  Procedure(s) performed:    Procedures    Medications - No data to display   ____________________________________________   INITIAL IMPRESSION / ASSESSMENT AND PLAN / ED COURSE  Pertinent labs & imaging results that were available during my care of the patient were reviewed by me and considered in my medical decision making (see chart for details).  Review of the  [place]RS was performed in accordance of the [place] prior to dispensing any controlled  drugs.           Patient's diagnosis is consistent with fall, abrasion to the forehead and knee.  Patient presented to emergency department after mechanical fall.  Patient struck his head and did sustain an abrasion to the forehead as well as the left knee.  Patient did not lose consciousness.  Overall exam was reassuring with patient being neurologically intact.  Patient did have slight pupillary asymmetry.  Patient has had 3 eye surgeries and states that he does have some difficulty with his pupil.  Otherwise, reassuring exam.  Imaging reveals no acute traumatic findings.  Patient had abrasions to the forehead, knee.  Given the fact that this occurs over joint I will place the patient on antibiotics prophylactically.  Patient is stable for discharge at this time.  No indication for further work-up.  Follow-up primary care as needed.  Patient is given ED precautions to return to the ED for any worsening or new symptoms.     ____________________________________________  FINAL CLINICAL IMPRESSION(S) / ED DIAGNOSES  Final diagnoses:  Fall, initial encounter  Abrasion of forehead, initial encounter  Abrasion of left knee, initial encounter      NEW MEDICATIONS STARTED DURING THIS VISIT:  ED Discharge Orders         Ordered    mupirocin cream (BACTROBAN)  2 %     09/14/19 1340              This chart was dictated using voice recognition software/Dragon. Despite best efforts to proofread, errors can occur which can change the meaning. Any change was purely unintentional.    Darletta Moll, PA-C 09/14/19 1342    Lavonia Drafts, MD 09/14/19 1406

## 2019-09-14 NOTE — ED Triage Notes (Signed)
Patient arrived via EMS. Patient is from home, is AOx4, ambulatory. Patient was walking and tripped on curb injuring left knee/leg and falling on right forehead. Patient is on Plavix. Patient pain is 1 on scale of 1-10 and denies SOB or Chest Pain.

## 2019-09-16 ENCOUNTER — Encounter: Payer: Self-pay | Admitting: Emergency Medicine

## 2019-09-16 ENCOUNTER — Inpatient Hospital Stay
Admission: EM | Admit: 2019-09-16 | Discharge: 2019-09-18 | DRG: 100 | Disposition: A | Discharge status: Other Acute Inpatient Hospital | Payer: Medicare Other | Attending: Pulmonary Disease | Admitting: Pulmonary Disease

## 2019-09-16 ENCOUNTER — Observation Stay: Payer: Medicare Other

## 2019-09-16 ENCOUNTER — Other Ambulatory Visit: Payer: Self-pay

## 2019-09-16 ENCOUNTER — Emergency Department: Payer: Medicare Other

## 2019-09-16 DIAGNOSIS — G9341 Metabolic encephalopathy: Secondary | ICD-10-CM | POA: Diagnosis not present

## 2019-09-16 DIAGNOSIS — E785 Hyperlipidemia, unspecified: Secondary | ICD-10-CM | POA: Diagnosis not present

## 2019-09-16 DIAGNOSIS — S0081XA Abrasion of other part of head, initial encounter: Secondary | ICD-10-CM | POA: Diagnosis present

## 2019-09-16 DIAGNOSIS — Z4659 Encounter for fitting and adjustment of other gastrointestinal appliance and device: Secondary | ICD-10-CM

## 2019-09-16 DIAGNOSIS — J9602 Acute respiratory failure with hypercapnia: Secondary | ICD-10-CM | POA: Diagnosis not present

## 2019-09-16 DIAGNOSIS — R9431 Abnormal electrocardiogram [ECG] [EKG]: Secondary | ICD-10-CM | POA: Diagnosis not present

## 2019-09-16 DIAGNOSIS — S80212A Abrasion, left knee, initial encounter: Secondary | ICD-10-CM | POA: Diagnosis present

## 2019-09-16 DIAGNOSIS — I2510 Atherosclerotic heart disease of native coronary artery without angina pectoris: Secondary | ICD-10-CM | POA: Diagnosis present

## 2019-09-16 DIAGNOSIS — Z950 Presence of cardiac pacemaker: Secondary | ICD-10-CM | POA: Diagnosis not present

## 2019-09-16 DIAGNOSIS — R001 Bradycardia, unspecified: Secondary | ICD-10-CM | POA: Diagnosis present

## 2019-09-16 DIAGNOSIS — R471 Dysarthria and anarthria: Secondary | ICD-10-CM | POA: Diagnosis present

## 2019-09-16 DIAGNOSIS — Z951 Presence of aortocoronary bypass graft: Secondary | ICD-10-CM

## 2019-09-16 DIAGNOSIS — Z8719 Personal history of other diseases of the digestive system: Secondary | ICD-10-CM

## 2019-09-16 DIAGNOSIS — Z87891 Personal history of nicotine dependence: Secondary | ICD-10-CM

## 2019-09-16 DIAGNOSIS — I10 Essential (primary) hypertension: Secondary | ICD-10-CM | POA: Diagnosis present

## 2019-09-16 DIAGNOSIS — Z978 Presence of other specified devices: Secondary | ICD-10-CM

## 2019-09-16 DIAGNOSIS — Z8616 Personal history of COVID-19: Secondary | ICD-10-CM

## 2019-09-16 DIAGNOSIS — R4782 Fluency disorder in conditions classified elsewhere: Secondary | ICD-10-CM | POA: Diagnosis present

## 2019-09-16 DIAGNOSIS — G459 Transient cerebral ischemic attack, unspecified: Secondary | ICD-10-CM

## 2019-09-16 DIAGNOSIS — N400 Enlarged prostate without lower urinary tract symptoms: Secondary | ICD-10-CM | POA: Diagnosis present

## 2019-09-16 DIAGNOSIS — Y9301 Activity, walking, marching and hiking: Secondary | ICD-10-CM | POA: Diagnosis present

## 2019-09-16 DIAGNOSIS — I6782 Cerebral ischemia: Secondary | ICD-10-CM | POA: Diagnosis present

## 2019-09-16 DIAGNOSIS — Z886 Allergy status to analgesic agent status: Secondary | ICD-10-CM

## 2019-09-16 DIAGNOSIS — Z20822 Contact with and (suspected) exposure to covid-19: Secondary | ICD-10-CM | POA: Diagnosis present

## 2019-09-16 DIAGNOSIS — Z888 Allergy status to other drugs, medicaments and biological substances status: Secondary | ICD-10-CM

## 2019-09-16 DIAGNOSIS — Z7902 Long term (current) use of antithrombotics/antiplatelets: Secondary | ICD-10-CM

## 2019-09-16 DIAGNOSIS — R4789 Other speech disturbances: Secondary | ICD-10-CM | POA: Diagnosis not present

## 2019-09-16 DIAGNOSIS — J9601 Acute respiratory failure with hypoxia: Secondary | ICD-10-CM | POA: Diagnosis not present

## 2019-09-16 DIAGNOSIS — N179 Acute kidney failure, unspecified: Secondary | ICD-10-CM | POA: Diagnosis not present

## 2019-09-16 DIAGNOSIS — R4701 Aphasia: Secondary | ICD-10-CM | POA: Diagnosis not present

## 2019-09-16 DIAGNOSIS — W19XXXA Unspecified fall, initial encounter: Secondary | ICD-10-CM | POA: Diagnosis not present

## 2019-09-16 DIAGNOSIS — Z03818 Encounter for observation for suspected exposure to other biological agents ruled out: Secondary | ICD-10-CM | POA: Diagnosis not present

## 2019-09-16 DIAGNOSIS — S299XXA Unspecified injury of thorax, initial encounter: Secondary | ICD-10-CM | POA: Diagnosis not present

## 2019-09-16 DIAGNOSIS — Z79890 Hormone replacement therapy (postmenopausal): Secondary | ICD-10-CM

## 2019-09-16 DIAGNOSIS — G40409 Other generalized epilepsy and epileptic syndromes, not intractable, without status epilepticus: Principal | ICD-10-CM | POA: Diagnosis present

## 2019-09-16 DIAGNOSIS — Z87442 Personal history of urinary calculi: Secondary | ICD-10-CM

## 2019-09-16 DIAGNOSIS — Z79899 Other long term (current) drug therapy: Secondary | ICD-10-CM

## 2019-09-16 DIAGNOSIS — K219 Gastro-esophageal reflux disease without esophagitis: Secondary | ICD-10-CM | POA: Diagnosis present

## 2019-09-16 DIAGNOSIS — H409 Unspecified glaucoma: Secondary | ICD-10-CM | POA: Diagnosis present

## 2019-09-16 DIAGNOSIS — D649 Anemia, unspecified: Secondary | ICD-10-CM | POA: Diagnosis present

## 2019-09-16 DIAGNOSIS — R29702 NIHSS score 2: Secondary | ICD-10-CM | POA: Diagnosis present

## 2019-09-16 DIAGNOSIS — K222 Esophageal obstruction: Secondary | ICD-10-CM | POA: Diagnosis present

## 2019-09-16 DIAGNOSIS — I6523 Occlusion and stenosis of bilateral carotid arteries: Secondary | ICD-10-CM | POA: Diagnosis present

## 2019-09-16 DIAGNOSIS — R29724 NIHSS score 24: Secondary | ICD-10-CM | POA: Diagnosis not present

## 2019-09-16 DIAGNOSIS — W010XXA Fall on same level from slipping, tripping and stumbling without subsequent striking against object, initial encounter: Secondary | ICD-10-CM | POA: Diagnosis present

## 2019-09-16 DIAGNOSIS — S0990XA Unspecified injury of head, initial encounter: Secondary | ICD-10-CM | POA: Diagnosis not present

## 2019-09-16 DIAGNOSIS — L8990 Pressure ulcer of unspecified site, unspecified stage: Secondary | ICD-10-CM | POA: Insufficient documentation

## 2019-09-16 DIAGNOSIS — Z955 Presence of coronary angioplasty implant and graft: Secondary | ICD-10-CM

## 2019-09-16 LAB — PROTIME-INR
INR: 1 (ref 0.8–1.2)
Prothrombin Time: 13.1 seconds (ref 11.4–15.2)

## 2019-09-16 LAB — URINALYSIS, COMPLETE (UACMP) WITH MICROSCOPIC
Bacteria, UA: NONE SEEN
Bilirubin Urine: NEGATIVE
Glucose, UA: NEGATIVE mg/dL
Hgb urine dipstick: NEGATIVE
Ketones, ur: 5 mg/dL — AB
Leukocytes,Ua: NEGATIVE
Nitrite: NEGATIVE
Protein, ur: NEGATIVE mg/dL
Specific Gravity, Urine: 1.013 (ref 1.005–1.030)
Squamous Epithelial / HPF: NONE SEEN (ref 0–5)
pH: 5 (ref 5.0–8.0)

## 2019-09-16 LAB — CBC WITH DIFFERENTIAL/PLATELET
Abs Immature Granulocytes: 0.01 10*3/uL (ref 0.00–0.07)
Basophils Absolute: 0 10*3/uL (ref 0.0–0.1)
Basophils Relative: 0 %
Eosinophils Absolute: 0 10*3/uL (ref 0.0–0.5)
Eosinophils Relative: 0 %
HCT: 35.3 % — ABNORMAL LOW (ref 39.0–52.0)
Hemoglobin: 11.8 g/dL — ABNORMAL LOW (ref 13.0–17.0)
Immature Granulocytes: 0 %
Lymphocytes Relative: 14 %
Lymphs Abs: 0.8 10*3/uL (ref 0.7–4.0)
MCH: 31.3 pg (ref 26.0–34.0)
MCHC: 33.4 g/dL (ref 30.0–36.0)
MCV: 93.6 fL (ref 80.0–100.0)
Monocytes Absolute: 0.3 10*3/uL (ref 0.1–1.0)
Monocytes Relative: 6 %
Neutro Abs: 4.2 10*3/uL (ref 1.7–7.7)
Neutrophils Relative %: 80 %
Platelets: 171 10*3/uL (ref 150–400)
RBC: 3.77 MIL/uL — ABNORMAL LOW (ref 4.22–5.81)
RDW: 13.9 % (ref 11.5–15.5)
WBC: 5.3 10*3/uL (ref 4.0–10.5)
nRBC: 0 % (ref 0.0–0.2)

## 2019-09-16 LAB — BASIC METABOLIC PANEL
Anion gap: 11 (ref 5–15)
BUN: 26 mg/dL — ABNORMAL HIGH (ref 8–23)
CO2: 24 mmol/L (ref 22–32)
Calcium: 9.2 mg/dL (ref 8.9–10.3)
Chloride: 103 mmol/L (ref 98–111)
Creatinine, Ser: 1.52 mg/dL — ABNORMAL HIGH (ref 0.61–1.24)
GFR calc Af Amer: 47 mL/min — ABNORMAL LOW (ref >60)
GFR calc non Af Amer: 41 mL/min — ABNORMAL LOW (ref >60)
Glucose, Bld: 101 mg/dL — ABNORMAL HIGH (ref 70–99)
Potassium: 4.7 mmol/L (ref 3.5–5.1)
Sodium: 138 mmol/L (ref 135–145)

## 2019-09-16 LAB — SARS CORONAVIRUS 2 BY RT PCR (HOSPITAL ORDER, PERFORMED IN ~~LOC~~ HOSPITAL LAB): SARS Coronavirus 2: NEGATIVE

## 2019-09-16 MED ORDER — ACETAMINOPHEN 650 MG RE SUPP
650.0000 mg | RECTAL | Status: DC | PRN
  Administered 2019-09-18: 650 mg via RECTAL
  Filled 2019-09-16: qty 1

## 2019-09-16 MED ORDER — FLUTICASONE PROPIONATE 50 MCG/ACT NA SUSP
2.0000 | Freq: Every day | NASAL | Status: DC
  Administered 2019-09-16 – 2019-09-17 (×2): 2 via NASAL
  Filled 2019-09-16: qty 16

## 2019-09-16 MED ORDER — SODIUM CHLORIDE 0.9 % IV SOLN: INTRAVENOUS | Status: DC

## 2019-09-16 MED ORDER — ACETAMINOPHEN 325 MG PO TABS: 650.0000 mg | ORAL_TABLET | ORAL | Status: DC | PRN

## 2019-09-16 MED ORDER — SENNOSIDES-DOCUSATE SODIUM 8.6-50 MG PO TABS: 1.0000 | ORAL_TABLET | Freq: Every evening | ORAL | Status: DC | PRN

## 2019-09-16 MED ORDER — STROKE: EARLY STAGES OF RECOVERY BOOK: Freq: Once | Status: AC

## 2019-09-16 MED ORDER — LATANOPROST 0.005 % OP SOLN
1.0000 [drp] | Freq: Every day | OPHTHALMIC | Status: DC
  Administered 2019-09-16 – 2019-09-17 (×2): 1 [drp] via OPHTHALMIC
  Filled 2019-09-16: qty 2.5

## 2019-09-16 MED ORDER — SODIUM CHLORIDE 0.9 % IV BOLUS
1000.0000 mL | Freq: Once | INTRAVENOUS | Status: AC
  Administered 2019-09-16: 1000 mL via INTRAVENOUS

## 2019-09-16 MED ORDER — ACETAMINOPHEN 160 MG/5ML PO SOLN
650.0000 mg | ORAL | Status: DC | PRN
  Filled 2019-09-16: qty 20.3

## 2019-09-16 NOTE — H&P (Signed)
History and Physical  Ivan Salazar [id number] DOB: 04/04/1932 DOA: 09/16/2019  Referring physician: Derrell Lolling  PCP: Birdie Sons, MD  Outpatient Specialists: Dr. Ernestine Conrad ( Urology), Dr. Saralyn Pilar (Cardiology, [facility]) Patient coming from: home At his baseline ambulates independently  Chief Complaint: difficulty with speech   HPI: Ivan Salazar is a 86 y.o. male with medical history significant for sinus bradycardia status post dual-chamber pacemaker (07/13/2017), CAD status post multiple coronary stents/CABG (2007), HTN, HLD, COVID-19 pneumonia (requiring hospitalization at [facility] from 1/26-1/30/2021) who presents on 09/16/2019 with sudden difficulty in speech.  Patient was seen in ED on 5/15 after tripping on a curb and falling on his right forehead, CT head at that time was nonacute and cervical spine CT also showed no acute findings.  Patient has difficulty presenting history due to her finding difficulty for further history obtained from patient supplemented by chart review.  He states that around 9 PM on 5/16 at his baseline but he got up in the middle of the night last night and felt unlike himself and reportedly notices difficulty with words at that time.  His neighbor came to check on him him this morning around 10 and noticed he was having stuttering.  Patient denies any further falls, no new weakness or numbness, no chest pain, no abdominal pain, no fevers or chills.  Patient is adherent with his Plavix for his CAD the. He also takes Toprol lisinopril for his blood pressure   ED Course:  Patient was afebrile, heart rate 59, Blood pressure 137/69, Covid test negative.  Lab work notable for glucose 101, BUN 26, creatinine 1.52, hemoglobin 11.8. CT head showed no acute intracranial abnormalities, atrophy and chronic microvascular ischemic changes.  Patient received 1 L normal saline bolus. TRH was called for further management  Review of Systems:As mentioned in the  history of present illness.Review of systems are otherwise negative Patient seen in the ED.   Past Medical History:  Diagnosis Date  . BPH (benign prostatic hyperplasia)   . CAD (coronary artery disease)   . Carpal tunnel syndrome   . DDD (degenerative disc disease), cervical   . Environmental and seasonal allergies   . Fall 09/14/2019   Fell on curb, injured left knee and hit right forehead  . GERD (gastroesophageal reflux disease)   . Glaucoma   . Impotence   . Nocturia   . Presence of permanent cardiac pacemaker   . Weight loss    Past Surgical History:  Procedure Laterality Date  . APPENDECTOMY    . BALLOON DILATION N/A 11/05/2018   Procedure: BALLOON DILATION;  Surgeon: Manya Silvas, MD;  Location: [facility] ENDOSCOPY;  Service: Endoscopy;  Laterality: N/A;  . BILATERAL CARPAL TUNNEL RELEASE     05/12/2011, 06/09/2011  . CARDIAC CATHETERIZATION  2004  . coils in vessel     6 coils  . CORONARY ANGIOPLASTY     x 5  . CORONARY ARTERY BYPASS GRAFT  2007   5; [facility]  . CORONARY STENT PLACEMENT  2005  . ESOPHAGOGASTRODUODENOSCOPY (EGD) WITH PROPOFOL N/A 10/28/2015   Procedure: ESOPHAGOGASTRODUODENOSCOPY (EGD) WITH PROPOFOL;  Surgeon: Manya Silvas, MD;  Location: [facility] ENDOSCOPY;  Service: Endoscopy;  Laterality: N/A;  . ESOPHAGOGASTRODUODENOSCOPY (EGD) WITH PROPOFOL N/A 11/05/2018   Procedure: ESOPHAGOGASTRODUODENOSCOPY (EGD) WITH PROPOFOL;  Surgeon: Manya Silvas, MD;  Location: [facility] ENDOSCOPY;  Service: Endoscopy;  Laterality: N/A;  . KIDNEY STONE SURGERY    . PACEMAKER INSERTION N/A 07/13/2017   Procedure:  INSERTION PACEMAKER;  Surgeon: Isaias Cowman, MD;  Location: [facility] ORS;  Service: Cardiovascular;  Laterality: N/A;  . SHOULDER ARTHROSCOPY W/ ROTATOR CUFF REPAIR Bilateral   . TONSILLECTOMY    . UPPER GI ENDOSCOPY  08/02/2010   Dr. Tedra Coupe, gastritis. H Pylori negative   Allergies  Allergen Reactions  . Celebrex [Celecoxib] Other (See Comments)    Foul  taste in mouth   . Mirtazapine Other (See Comments)    Foul taste in mouth   Social History:  reports that he quit smoking about 42 years ago. He has a 10.00 pack-year smoking history. He has never used smokeless tobacco. He reports current alcohol use of about 7.0 - 14.0 standard drinks of alcohol per week. He reports that he does not use drugs. Family History  Problem Relation Age of Onset  . Cirrhosis Father        of liver  . Cancer Daughter   . Kidney disease Neg Hx   . Prostate cancer Neg Hx   . Kidney cancer Neg Hx   . Bladder Cancer Neg Hx      Prior to Admission medications   Medication Sig Start Date End Date Taking? Authorizing Provider  atorvastatin (LIPITOR) 80 MG tablet TAKE ONE TABLET BY MOUTH DAILY Patient taking differently: Take 80 mg by mouth daily at 6 PM.  01/02/19  Yes Fisher, Kirstie Peri, MD  clopidogrel (PLAVIX) 75 MG tablet TAKE ONE TABLET BY MOUTH DAILY Patient taking differently: Take 75 mg by mouth daily.  08/03/19  Yes Birdie Sons, MD  enalapril (VASOTEC) 5 MG tablet TAKE ONE TABLET BY MOUTH DAILY Patient taking differently: Take 5 mg by mouth daily.  08/10/19  Yes Birdie Sons, MD  fluticasone (FLONASE) 50 MCG/ACT nasal spray SPRAY TWO SPRAYS IN EACH NOSTRIL ONCE DAILY Patient taking differently: Place 2 sprays into both nostrils daily.  04/14/19  Yes Birdie Sons, MD  guaiFENesin-codeine 100-10 MG/5ML syrup TAKE ONE TO TWO TEASPOONSFUL (5-10 MILLILITERS) BY MOUTH EVERY SIX HOURS AS NEEDED FOR COUGH Patient taking differently: Take 5-10 mLs by mouth every 6 (six) hours as needed for cough.  07/16/19  Yes Birdie Sons, MD  HYDROcodone-acetaminophen [facility]) 7.5-325 MG tablet 1 tablet every 4-6 hours as needed 09/09/19  Yes Fisher, Kirstie Peri, MD  latanoprost (XALATAN) 0.005 % ophthalmic solution Place 1 drop into both eyes at bedtime.    Yes [provider]  metoprolol succinate (TOPROL-XL) 25 MG 24 hr tablet TAKE ONE TABLET BY MOUTH  DAILY Patient taking differently: Take 25 mg by mouth daily.  10/28/18  Yes Birdie Sons, MD  mupirocin cream Drue Stager) 2 % Apply to affected area 3 times daily 09/14/19  Yes Cuthriell, Charline Bills, PA-C  testosterone cypionate (DEPOTESTOSTERONE CYPIONATE) 200 MG/ML injection Inject 1 mL (200 mg total) into the muscle every 28 (twenty-eight) days. 07/04/19  Yes McGowan, Larene Beach A, PA-C  zolpidem (AMBIEN) 10 MG tablet Take 0.5-1 tablets (5-10 mg total) by mouth at bedtime. TAKE 1/2 TO 1 TABLET EVERY NIGHT AT BEDTIME Patient taking differently: Take 5-10 mg by mouth at bedtime.  07/05/19  Yes Birdie Sons, MD  levocetirizine (XYZAL) 5 MG tablet Take 1 tablet (5 mg total) by mouth every evening. Patient not taking: Reported on 09/16/2019 03/06/19   Birdie Sons, MD    Physical Exam: BP (!) 162/62   Pulse 65   Temp 98.6 F (37 C) (Oral)   Resp 15   Ht 5\' 10"  (1.778 m)  Wt 62.1 kg   SpO2 100%   BMI 19.64 kg/m   Constitutional elderly male, lying in bed, no distress Eyes: EOMI, anicteric, normal conjunctivae ENMT: Oropharynx with dry mucous membranes, poor dentition Cardiovascular: RRR no MRGs, with no peripheral edema Respiratory: Normal respiratory effort on room air, clear breath sounds  Abdomen: Soft,non-tender, with no HSM Skin: Abrasion with bruising on right forehead,  Neurologic: Difficulty in finding words, stutter present follows all commands, full strength in upper and lower extremities.  Seems to have some difficulty with rapid alternating movements.  No obvious facial droop. Psychiatric:Appropriate affect, and mood. Mental status AAOx3          Labs on Admission:  Basic Metabolic Panel: Recent Labs  Lab 09/16/19 1251  NA 138  K 4.7  CL 103  CO2 24  GLUCOSE 101*  BUN 26*  CREATININE 1.52*  CALCIUM 9.2   Liver Function Tests: No results for input(s): AST, ALT, ALKPHOS, BILITOT, PROT, ALBUMIN in the last 168 hours. No results for input(s): LIPASE, AMYLASE  in the last 168 hours. No results for input(s): AMMONIA in the last 168 hours. CBC: Recent Labs  Lab 09/16/19 1251  WBC 5.3  NEUTROABS 4.2  HGB 11.8*  HCT 35.3*  MCV 93.6  PLT 171   Cardiac Enzymes: No results for input(s): CKTOTAL, CKMB, CKMBINDEX, TROPONINI in the last 168 hours.  BNP (last 3 results) No results for input(s): BNP in the last 8760 hours.  ProBNP (last 3 results) No results for input(s): PROBNP in the last 8760 hours.  CBG: No results for input(s): GLUCAP in the last 168 hours.  Radiological Exams on Admission: CT Head Wo Contrast  Result Date: 09/16/2019 CLINICAL DATA:  Fall on 09/14/2019. Negative CT at that time. Patient had an episode of altered cognition with stuttering and expressive aphasia this morning. EXAM: CT HEAD WITHOUT CONTRAST TECHNIQUE: Contiguous axial images were obtained from the base of the skull through the vertex without intravenous contrast. COMPARISON:  09/14/2019 FINDINGS: Brain: No evidence of acute infarction, hemorrhage, hydrocephalus, extra-axial collection or mass lesion/mass effect. There is ventricular and sulcal enlargement reflecting mild diffuse atrophy. Patchy white matter hypoattenuation is also noted consistent with mild chronic microvascular ischemic change. These findings are stable. Vascular: No hyperdense vessel or unexpected calcification. Skull: Normal. Negative for fracture or focal lesion. Sinuses/Orbits: Globes and orbits are unremarkable. Visualized sinuses and mastoid air cells are clear. Other: None. IMPRESSION: 1. No acute intracranial abnormalities. 2. Atrophy and chronic microvascular ischemic change stable from the prior study. Electronically Signed   By: Lajean Manes M.D.   On: 09/16/2019 13:46    EKG: Independently reviewed. Atrial paced  Assessment/Plan Present on Admission: . Word finding difficulty . Expressive aphasia . AKI (acute kidney injury) ([place]) . Essential (primary) hypertension . HLD  (hyperlipidemia) . Status post cardiac pacemaker procedure . CAD in native artery  Active Problems:   HLD (hyperlipidemia)   CAD in native artery   Essential (primary) hypertension   Status post cardiac pacemaker procedure   Word finding difficulty   Expressive aphasia   AKI (acute kidney injury) ([place])    New word finding difficulty after a fall concerning for stroke.  Persistent difficulty in word finding, intermittent stuttering, seems consistent with expressive aphasia.  CT head nonacute with stable chronic microvascular ischemic changes, unable to obtain MRI due to pacemaker. -Neurology consulted -Repeat CT head in a.m. -check TTE, A1c, lipid panel -Carotid ultrasound -Unable to obtain MRI due to pacemaker --Speech eval,  PT/OT  AKI prerenal, likely related to diminished oral hydration.  1.52 on admission, baseline 0.9. -Continue IV fluids at 75 cc an hour for 24 hours -Repeat BMP in a.m. -Avoid nephrotoxins -Obtain UA  Chronic normocytic anemia, stable.  Hemoglobin stable at baseline 11.  No signs or symptoms of bleeding. -Daily CBC  CAD, status post multiple stents/CABG (2007).  Currently asymptomatic -Monitor on telemetry -Currently holding home Plavix, ruling out any interval changes in CT head given recent fall -Holding home Toprol until speech eval  HTN.  Allow permissive hypertension while ruling out stroke -Holding home lisinopril and metoprolol succinate  Hyperlipidemia -Check lipid panel -currently holding statin while awaiting speech eval  Sinus bradycardia, status post dual-chamber pacemaker (07/13/2017) -Monitor on telemetry  History of esophageal ring, status post balloon dilation with EGD on 10/2018 -Speech to evaluate as mentioned above   DVT prophylaxis: SCDs  Code Status: Full code, discussed on day of admission  Family Communication: Spoke with son Kydan Popescu at [phone]    Consults called: Neurology Admission status: Admitted as  observation at surgery with close telemetry monitoring, for work-up of stroke/TIA.      Desiree Hane MD [facility]  Pager [phone]  If 7PM-7AM, please contact night-coverage [email or web address] Password [facility]  09/16/2019, 4:38 PM

## 2019-09-16 NOTE — ED Notes (Signed)
Patient assisted to sit on edge of bed and use urinal. Had attempted to stand, but was very shaky and complaining of generalized weakness and stiffness. Urine sample collected and sent to lab.

## 2019-09-16 NOTE — ED Triage Notes (Signed)
Patient from home via [facility]. Per EMS patient was seen 5/15 for fall. Hit his head. CT negative at that time. Patient states he went to bed at 9 pm last night with normal speech. Got up once during the night and was talking at baseline but is unsure of when that way. States his neighbor came to check on him this morning around 10 and noticed he was having some stuttering and expressive aphasia.   Patient with equal strength and sensation. Patient denies any additional falls.

## 2019-09-16 NOTE — ED Provider Notes (Signed)
[facility] Emergency Department Provider Note  ____________________________________________   First MD Initiated Contact with Patient 09/16/19 1253     (approximate)  I have reviewed the triage vital signs and the nursing notes.  History  Chief Complaint Aphasia    HPI Ivan Salazar is a 86 y.o. male past medical history as below, who presents to the emergency department for aphasia and word finding difficulties.  Patient was seen here on 5/15 for a fall, with hit to the head.  Had a CT that was negative.  Was discharged and states he was otherwise feeling well afterwards.  Went to bed last night (5/16) around 9 PM and was at his baseline.  This morning his neighbor came to check on him around 10 AM and noticed he was having some stuttering/word finding difficulties, expressive aphasia.  No weakness or numbness.  No facial droop.  Patient denies any additional falls.  Patient did get up in the middle of the night and felt like he was at his baseline, but he lives along and is unsure of what time that was.  Last true known well was before bed.  Patient takes Plavix, no other antiplatelets or anticoagulation.   Past Medical Hx Past Medical History:  Diagnosis Date  . BPH (benign prostatic hyperplasia)   . CAD (coronary artery disease)   . Carpal tunnel syndrome   . DDD (degenerative disc disease), cervical   . Environmental and seasonal allergies   . Fall 09/14/2019   Fell on curb, injured left knee and hit right forehead  . GERD (gastroesophageal reflux disease)   . Glaucoma   . Impotence   . Nocturia   . Presence of permanent cardiac pacemaker   . Weight loss     Problem List Patient Active Problem List   Diagnosis Date Noted  . Pneumonia due to COVID-19 virus 05/28/2019  . Stiffness of shoulder joint 03/14/2019  . Status post cardiac pacemaker procedure 07/13/2017  . Mitral insufficiency 06/17/2017  . OA (osteoarthritis) of neck 11/01/2016  .  Eustachian tube dysfunction 09/03/2015  . Schatzki's ring 09/03/2015  . Adenomatous polyp of colon 09/03/2015  . Hypogonadism in male 03/09/2015  . BPH with obstruction/lower urinary tract symptoms 03/09/2015  . Loss of weight 03/05/2015  . Actinic keratoses 10/22/2014  . DDD (degenerative disc disease), lumbosacral 10/22/2014  . Acid reflux 10/22/2014  . Testicular hypofunction 06/27/2012  . Bursitis of hip 01/18/2011  . Family history of colonic polyps 12/24/2007  . CAD in native artery 08/16/2006  . H/O coronary artery bypass surgery 08/16/2006  . Anemia, iron deficiency 09/23/2005  . Cardiac enlargement 07/20/2005  . HLD (hyperlipidemia) 05/02/1998  . Insomnia, persistent 05/02/1998  . Essential (primary) hypertension 05/02/1998    Past Surgical Hx Past Surgical History:  Procedure Laterality Date  . APPENDECTOMY    . BALLOON DILATION N/A 11/05/2018   Procedure: BALLOON DILATION;  Surgeon: Manya Silvas, MD;  Location: [facility] ENDOSCOPY;  Service: Endoscopy;  Laterality: N/A;  . BILATERAL CARPAL TUNNEL RELEASE     05/12/2011, 06/09/2011  . CARDIAC CATHETERIZATION  2004  . coils in vessel     6 coils  . CORONARY ANGIOPLASTY     x 5  . CORONARY ARTERY BYPASS GRAFT  2007   5; [facility]  . CORONARY STENT PLACEMENT  2005  . ESOPHAGOGASTRODUODENOSCOPY (EGD) WITH PROPOFOL N/A 10/28/2015   Procedure: ESOPHAGOGASTRODUODENOSCOPY (EGD) WITH PROPOFOL;  Surgeon: Manya Silvas, MD;  Location: [place];  Service: Endoscopy;  Laterality: N/A;  . ESOPHAGOGASTRODUODENOSCOPY (EGD) WITH PROPOFOL N/A 11/05/2018   Procedure: ESOPHAGOGASTRODUODENOSCOPY (EGD) WITH PROPOFOL;  Surgeon: Manya Silvas, MD;  Location: [facility] ENDOSCOPY;  Service: Endoscopy;  Laterality: N/A;  . KIDNEY STONE SURGERY    . PACEMAKER INSERTION N/A 07/13/2017   Procedure: INSERTION PACEMAKER;  Surgeon: Isaias Cowman, MD;  Location: [facility] ORS;  Service: Cardiovascular;  Laterality: N/A;  . SHOULDER ARTHROSCOPY  W/ ROTATOR CUFF REPAIR Bilateral   . TONSILLECTOMY    . UPPER GI ENDOSCOPY  08/02/2010   Dr. Tedra Coupe, gastritis. H Pylori negative    Medications Prior to Admission medications   Medication Sig Start Date End Date Taking? Authorizing Provider  atorvastatin (LIPITOR) 80 MG tablet TAKE ONE TABLET BY MOUTH DAILY Patient taking differently: Take 80 mg by mouth daily at 6 PM.  01/02/19   Birdie Sons, MD  clopidogrel (PLAVIX) 75 MG tablet TAKE ONE TABLET BY MOUTH DAILY 08/03/19   Birdie Sons, MD  enalapril (VASOTEC) 5 MG tablet TAKE ONE TABLET BY MOUTH DAILY 08/10/19   Birdie Sons, MD  fluticasone (FLONASE) 50 MCG/ACT nasal spray SPRAY TWO SPRAYS IN EACH NOSTRIL ONCE DAILY 04/14/19   Birdie Sons, MD  guaiFENesin-codeine 100-10 MG/5ML syrup TAKE ONE TO TWO TEASPOONSFUL (5-10 MILLILITERS) BY MOUTH EVERY SIX HOURS AS NEEDED FOR COUGH 07/16/19   Birdie Sons, MD  HYDROcodone-acetaminophen [facility]) 7.5-325 MG tablet 1 tablet every 4-6 hours as needed 09/09/19   Birdie Sons, MD  latanoprost (XALATAN) 0.005 % ophthalmic solution Place 1 drop into both eyes at bedtime.     [provider]  levocetirizine (XYZAL) 5 MG tablet Take 1 tablet (5 mg total) by mouth every evening. 03/06/19   Birdie Sons, MD  metoprolol succinate (TOPROL-XL) 25 MG 24 hr tablet TAKE ONE TABLET BY MOUTH DAILY Patient taking differently: Take 25 mg by mouth daily.  10/28/18   Birdie Sons, MD  mupirocin cream Drue Stager) 2 % Apply to affected area 3 times daily 09/14/19   Cuthriell, Charline Bills, PA-C  mupirocin ointment (BACTROBAN) 2 %  05/28/18   [provider]  polyethylene glycol-electrolytes (GAVILYTE-N WITH FLAVOR PACK) 420 g solution GaviLyte-N 420 gram oral solution 05/24/18   [provider]  testosterone cypionate (DEPOTESTOSTERONE CYPIONATE) 200 MG/ML injection Inject 1 mL (200 mg total) into the muscle every 28 (twenty-eight) days. 07/04/19   McGowan, Larene Beach A, PA-C    zolpidem (AMBIEN) 10 MG tablet Take 0.5-1 tablets (5-10 mg total) by mouth at bedtime. TAKE 1/2 TO 1 TABLET EVERY NIGHT AT BEDTIME 07/05/19   Birdie Sons, MD    Allergies Celebrex [celecoxib] and Mirtazapine  Family Hx Family History  Problem Relation Age of Onset  . Cirrhosis Father        of liver  . Cancer Daughter   . Kidney disease Neg Hx   . Prostate cancer Neg Hx   . Kidney cancer Neg Hx   . Bladder Cancer Neg Hx     Social Hx Social History   Tobacco Use  . Smoking status: Former Smoker    Packs/day: 0.50    Years: 20.00    Pack years: 10.00    Quit date: 05/01/1977    Years since quitting: 42.4  . Smokeless tobacco: Never Used  Substance Use Topics  . Alcohol use: Yes    Alcohol/week: 7.0 - 14.0 standard drinks    Types: 7 - 14 Shots of liquor per week  Comment: moderate; 1-2 drinks daily  . Drug use: No     Review of Systems  Constitutional: Negative for fever. Negative for chills. Eyes: Negative for visual changes. ENT: Negative for sore throat. Cardiovascular: Negative for chest pain. Respiratory: Negative for shortness of breath. Gastrointestinal: Negative for nausea. Negative for vomiting.  Genitourinary: Negative for dysuria. Musculoskeletal: Negative for leg swelling. Skin: Negative for rash. Neurological: Negative for headaches. + word finding difficulties   Physical Exam  Vital Signs: ED Triage Vitals  Enc Vitals Group     BP 09/16/19 1248 (!) 157/69     Pulse Rate 09/16/19 1248 65     Resp 09/16/19 1248 17     Temp 09/16/19 1248 98.6 F (37 C)     Temp Source 09/16/19 1248 Oral     SpO2 09/16/19 1248 100 %     Weight 09/16/19 1252 136 lb 14.5 oz (62.1 kg)     Height 09/16/19 1252 5\' 10"  (1.778 m)     Head Circumference --      Peak Flow --      Pain Score 09/16/19 1251 6     Pain Loc --      Pain Edu? --      Excl. in [place]? --     Constitutional: Alert and oriented.  Elderly, but otherwise well appearing. NAD.  Head:  Normocephalic. Atraumatic.  No facial droop. Eyes: Conjunctivae clear. Sclera anicteric. Pupils equal and symmetric. Nose: No masses or lesions. No congestion or rhinorrhea. Mouth/Throat: Wearing mask.  Neck: No stridor. Trachea midline.  Cardiovascular: Normal rate, regular rhythm. Extremities well perfused. Respiratory: Normal respiratory effort.  Lungs CTAB. Gastrointestinal: Soft. Non-distended. Non-tender.  Genitourinary: Deferred. Musculoskeletal: No lower extremity edema. No deformities. Neurologic: Mild to moderate word finding difficulties, aphasia.  Seemingly slight dysarthria as well.  Otherwise, no other focal neurological deficits noted.  Alert and oriented, follows commands, no facial droop. BUE and BLE strength 5/5 and symmetric. SILT.  No facial droop. NIHSS 2. Skin: Skin is warm, dry and intact. No rash noted. Psychiatric: Mood and affect are appropriate for situation.  EKG  Personally reviewed and interpreted by myself.   Date: 09/16/19 Time: 1243 Rate: 70 Rhythm: atrial paced Axis: normal Intervals: WNL Atrial paced rhythm    Radiology  Personally reviewed available imaging myself.   CT[facility] - IMPRESSION: 1. No acute intracranial abnormalities. 2. Atrophy and chronic microvascular ischemic change stable from the prior study.   Procedures  Procedure(s) performed (including critical care):  Procedures   Initial Impression / Assessment and Plan / MDM / ED Course  86 y.o. male who presents to the ED for word finding difficulties since this morning, 10 AM. Last known well was last night before bed, therefore out of tPA window. No weakness, therefore VAN negative  Ddx: delayed intracranial bleed 2/2 fall, CVA/TIA, complex migraine  Will plan for labs, imaging  CT head is negative for any acute intracranial abnormalities.  As such, will proceed with admission for further stroke work-up.   _______________________________   As part of my medical  decision making I have reviewed available labs, radiology tests, reviewed old records/performed chart review.    Final Clinical Impression(s) / ED Diagnosis  Final diagnoses:  Word finding difficulty       Note:  This document was prepared using Dragon voice recognition software and may include unintentional dictation errors.   Lilia Pro., MD 09/16/19 [phone]

## 2019-09-16 NOTE — ED Notes (Signed)
Pt with hx of aphagia and multiple endoscopy to "unclog" throat. States he has not had any issues for the last 3 years and drinks thin liquids at home. Patient did well until last swallow of water, at which point he began to cough. SPL eval and treat order placed. MD made aware.

## 2019-09-16 NOTE — ED Notes (Signed)
Pt transported to floor by EDT.

## 2019-09-17 ENCOUNTER — Observation Stay: Admit: 2019-09-17 | Payer: Medicare Other

## 2019-09-17 ENCOUNTER — Observation Stay
Admit: 2019-09-17 | Discharge: 2019-09-17 | Disposition: A | Discharge status: Home / Self Care | Payer: Medicare Other | Attending: Internal Medicine | Admitting: Internal Medicine

## 2019-09-17 ENCOUNTER — Observation Stay: Payer: Medicare Other

## 2019-09-17 DIAGNOSIS — G40101 Localization-related (focal) (partial) symptomatic epilepsy and epileptic syndromes with simple partial seizures, not intractable, with status epilepticus: Secondary | ICD-10-CM | POA: Diagnosis not present

## 2019-09-17 DIAGNOSIS — R4782 Fluency disorder in conditions classified elsewhere: Secondary | ICD-10-CM | POA: Diagnosis present

## 2019-09-17 DIAGNOSIS — I639 Cerebral infarction, unspecified: Secondary | ICD-10-CM | POA: Diagnosis present

## 2019-09-17 DIAGNOSIS — R4701 Aphasia: Secondary | ICD-10-CM | POA: Diagnosis present

## 2019-09-17 DIAGNOSIS — R4182 Altered mental status, unspecified: Secondary | ICD-10-CM | POA: Diagnosis not present

## 2019-09-17 DIAGNOSIS — Z8616 Personal history of COVID-19: Secondary | ICD-10-CM | POA: Diagnosis not present

## 2019-09-17 DIAGNOSIS — S0081XA Abrasion of other part of head, initial encounter: Secondary | ICD-10-CM | POA: Diagnosis present

## 2019-09-17 DIAGNOSIS — G459 Transient cerebral ischemic attack, unspecified: Secondary | ICD-10-CM

## 2019-09-17 DIAGNOSIS — I6782 Cerebral ischemia: Secondary | ICD-10-CM | POA: Diagnosis present

## 2019-09-17 DIAGNOSIS — R001 Bradycardia, unspecified: Secondary | ICD-10-CM | POA: Diagnosis present

## 2019-09-17 DIAGNOSIS — Z20822 Contact with and (suspected) exposure to covid-19: Secondary | ICD-10-CM | POA: Diagnosis present

## 2019-09-17 DIAGNOSIS — I2510 Atherosclerotic heart disease of native coronary artery without angina pectoris: Secondary | ICD-10-CM | POA: Diagnosis present

## 2019-09-17 DIAGNOSIS — I10 Essential (primary) hypertension: Secondary | ICD-10-CM | POA: Diagnosis present

## 2019-09-17 DIAGNOSIS — W010XXA Fall on same level from slipping, tripping and stumbling without subsequent striking against object, initial encounter: Secondary | ICD-10-CM | POA: Diagnosis present

## 2019-09-17 DIAGNOSIS — D649 Anemia, unspecified: Secondary | ICD-10-CM | POA: Diagnosis present

## 2019-09-17 DIAGNOSIS — J9602 Acute respiratory failure with hypercapnia: Secondary | ICD-10-CM | POA: Diagnosis not present

## 2019-09-17 DIAGNOSIS — R29724 NIHSS score 24: Secondary | ICD-10-CM | POA: Diagnosis not present

## 2019-09-17 DIAGNOSIS — H409 Unspecified glaucoma: Secondary | ICD-10-CM | POA: Diagnosis present

## 2019-09-17 DIAGNOSIS — G40409 Other generalized epilepsy and epileptic syndromes, not intractable, without status epilepticus: Secondary | ICD-10-CM | POA: Diagnosis present

## 2019-09-17 DIAGNOSIS — K219 Gastro-esophageal reflux disease without esophagitis: Secondary | ICD-10-CM | POA: Diagnosis present

## 2019-09-17 DIAGNOSIS — R29702 NIHSS score 2: Secondary | ICD-10-CM | POA: Diagnosis present

## 2019-09-17 DIAGNOSIS — R4789 Other speech disturbances: Secondary | ICD-10-CM | POA: Diagnosis not present

## 2019-09-17 DIAGNOSIS — S80212A Abrasion, left knee, initial encounter: Secondary | ICD-10-CM | POA: Diagnosis present

## 2019-09-17 DIAGNOSIS — Y9301 Activity, walking, marching and hiking: Secondary | ICD-10-CM | POA: Diagnosis present

## 2019-09-17 DIAGNOSIS — J9601 Acute respiratory failure with hypoxia: Secondary | ICD-10-CM | POA: Diagnosis not present

## 2019-09-17 DIAGNOSIS — Z950 Presence of cardiac pacemaker: Secondary | ICD-10-CM | POA: Diagnosis not present

## 2019-09-17 DIAGNOSIS — R471 Dysarthria and anarthria: Secondary | ICD-10-CM | POA: Diagnosis present

## 2019-09-17 DIAGNOSIS — I6523 Occlusion and stenosis of bilateral carotid arteries: Secondary | ICD-10-CM | POA: Diagnosis present

## 2019-09-17 DIAGNOSIS — E785 Hyperlipidemia, unspecified: Secondary | ICD-10-CM | POA: Diagnosis not present

## 2019-09-17 DIAGNOSIS — N179 Acute kidney failure, unspecified: Secondary | ICD-10-CM | POA: Diagnosis present

## 2019-09-17 DIAGNOSIS — G9341 Metabolic encephalopathy: Secondary | ICD-10-CM | POA: Diagnosis not present

## 2019-09-17 DIAGNOSIS — N400 Enlarged prostate without lower urinary tract symptoms: Secondary | ICD-10-CM | POA: Diagnosis present

## 2019-09-17 DIAGNOSIS — K222 Esophageal obstruction: Secondary | ICD-10-CM | POA: Diagnosis present

## 2019-09-17 LAB — BASIC METABOLIC PANEL
Anion gap: 11 (ref 5–15)
BUN: 24 mg/dL — ABNORMAL HIGH (ref 8–23)
CO2: 22 mmol/L (ref 22–32)
Calcium: 8.8 mg/dL — ABNORMAL LOW (ref 8.9–10.3)
Chloride: 107 mmol/L (ref 98–111)
Creatinine, Ser: 1.12 mg/dL (ref 0.61–1.24)
GFR calc Af Amer: >60 mL/min (ref >60)
GFR calc non Af Amer: 59 mL/min — ABNORMAL LOW (ref >60)
Glucose, Bld: 74 mg/dL (ref 70–99)
Potassium: 4.5 mmol/L (ref 3.5–5.1)
Sodium: 140 mmol/L (ref 135–145)

## 2019-09-17 LAB — CBC
HCT: 38.5 % — ABNORMAL LOW (ref 39.0–52.0)
Hemoglobin: 12.4 g/dL — ABNORMAL LOW (ref 13.0–17.0)
MCH: 30.8 pg (ref 26.0–34.0)
MCHC: 32.2 g/dL (ref 30.0–36.0)
MCV: 95.8 fL (ref 80.0–100.0)
Platelets: 174 10*3/uL (ref 150–400)
RBC: 4.02 MIL/uL — ABNORMAL LOW (ref 4.22–5.81)
RDW: 13.6 % (ref 11.5–15.5)
WBC: 7.4 10*3/uL (ref 4.0–10.5)
nRBC: 0 % (ref 0.0–0.2)

## 2019-09-17 LAB — LIPID PANEL
Cholesterol: 111 mg/dL (ref 0–200)
HDL: 42 mg/dL (ref >40)
LDL Cholesterol: 57 mg/dL (ref 0–99)
Total CHOL/HDL Ratio: 2.6 RATIO
Triglycerides: 60 mg/dL (ref <150)
VLDL: 12 mg/dL (ref 0–40)

## 2019-09-17 LAB — ECHOCARDIOGRAM COMPLETE
Height: 70 in
Weight: 2190.49 oz

## 2019-09-17 MED ORDER — ENOXAPARIN SODIUM 40 MG/0.4ML ~~LOC~~ SOLN: 40.0000 mg | SUBCUTANEOUS | Status: DC

## 2019-09-17 MED ORDER — ATORVASTATIN CALCIUM 20 MG PO TABS: 80.0000 mg | ORAL_TABLET | Freq: Every day | ORAL | Status: DC

## 2019-09-17 MED ORDER — CLOPIDOGREL BISULFATE 75 MG PO TABS: 75.0000 mg | ORAL_TABLET | Freq: Every day | ORAL | Status: DC

## 2019-09-17 MED ORDER — ASPIRIN EC 81 MG PO TBEC: 81.0000 mg | DELAYED_RELEASE_TABLET | Freq: Every day | ORAL | Status: DC

## 2019-09-17 NOTE — Progress Notes (Signed)
*  PRELIMINARY RESULTS* Echocardiogram 2D Echocardiogram has been performed.  Ivan Salazar 09/17/2019, 11:37 AM

## 2019-09-17 NOTE — Consult Note (Signed)
Reason for Consult: speech problem  Requesting Physician: Dr. Lonny Prude   CC: speech problem  HPI: Ivan Salazar is an 86 y.o. male with medical history significant for sinus bradycardia status post dual-chamber pacemaker (07/13/2017), CAD status post multiple coronary stents/CABG (2007), HTN, HLD, COVID-19 pneumonia (requiring hospitalization at [facility] from 1/26-1/30/2021) who presents on 09/16/2019 with sudden difficulty in speech.  CTH no acute abnormalities done x 2. Unable to obtain MRI to to [facility]   Past Medical History:  Diagnosis Date  . BPH (benign prostatic hyperplasia)   . CAD (coronary artery disease)   . Carpal tunnel syndrome   . DDD (degenerative disc disease), cervical   . Environmental and seasonal allergies   . Fall 09/14/2019   Fell on curb, injured left knee and hit right forehead  . GERD (gastroesophageal reflux disease)   . Glaucoma   . Impotence   . Nocturia   . Presence of permanent cardiac pacemaker   . Weight loss     Past Surgical History:  Procedure Laterality Date  . APPENDECTOMY    . BALLOON DILATION N/A 11/05/2018   Procedure: BALLOON DILATION;  Surgeon: Manya Silvas, MD;  Location: [facility] ENDOSCOPY;  Service: Endoscopy;  Laterality: N/A;  . BILATERAL CARPAL TUNNEL RELEASE     05/12/2011, 06/09/2011  . CARDIAC CATHETERIZATION  2004  . coils in vessel     6 coils  . CORONARY ANGIOPLASTY     x 5  . CORONARY ARTERY BYPASS GRAFT  2007   5; [facility]  . CORONARY STENT PLACEMENT  2005  . ESOPHAGOGASTRODUODENOSCOPY (EGD) WITH PROPOFOL N/A 10/28/2015   Procedure: ESOPHAGOGASTRODUODENOSCOPY (EGD) WITH PROPOFOL;  Surgeon: Manya Silvas, MD;  Location[facility] ENDOSCOPY;  Service: Endoscopy;  Laterality: N/A;  . ESOPHAGOGASTRODUODENOSCOPY (EGD) WITH PROPOFOL N/A 11/05/2018   Procedure: ESOPHAGOGASTRODUODENOSCOPY (EGD) WITH PROPOFOL;  Surgeon: Manya Silvas, MD;  Location: [facility] ENDOSCOPY;  Service: Endoscopy;  Laterality: N/A;  . KIDNEY STONE SURGERY    .  PACEMAKER INSERTION N/A 07/13/2017   Procedure: INSERTION PACEMAKER;  Surgeon: Isaias Cowman, MD;  Location: [facility] ORS;  Service: Cardiovascular;  Laterality: N/A;  . SHOULDER ARTHROSCOPY W/ ROTATOR CUFF REPAIR Bilateral   . TONSILLECTOMY    . UPPER GI ENDOSCOPY  08/02/2010   Dr. Tedra Coupe, gastritis. H Pylori negative    Family History  Problem Relation Age of Onset  . Cirrhosis Father        of liver  . Cancer Daughter   . Kidney disease Neg Hx   . Prostate cancer Neg Hx   . Kidney cancer Neg Hx   . Bladder Cancer Neg Hx     Social History:  reports that he quit smoking about 42 years ago. He has a 10.00 pack-year smoking history. He has never used smokeless tobacco. He reports current alcohol use of about 7.0 - 14.0 standard drinks of alcohol per week. He reports that he does not use drugs.  Allergies  Allergen Reactions  . Celebrex [Celecoxib] Other (See Comments)    Foul taste in mouth   . Mirtazapine Other (See Comments)    Foul taste in mouth    Medications: I have reviewed the patient's current medications.  ROS: Stuttering speech difficult to obtain   Physical Examination: Blood pressure (!) 152/52, pulse 72, temperature 98.2 F (36.8 C), temperature source Oral, resp. rate 17, height 5\' 10"  (1.778 m), weight 62.1 kg, SpO2 98 %.   Neurological Examination   Mental Status: Alert, oriented to name  and location.  Stuttering speech with dysarthria Cranial Nerves: II: Discs flat bilaterally; Visual fields grossly normal, pupils equal, round, reactive to light and accommodation III,IV, VI: ptosis not present, extra-ocular motions intact bilaterally V,VII: smile symmetric, facial light touch sensation normal bilaterally VIII: hearing normal bilaterally IX,X: gag reflex present XI: bilateral shoulder shrug XII: midline tongue extension Motor: Right : Upper extremity   4/5 chronic rotator cuff injury    Left:     Upper extremity   5/5  Lower extremity    5/5     Lower extremity   5/5 Tone and bulk:normal tone throughout; no atrophy noted Sensory: Pinprick and light touch intact throughout, bilaterally Deep Tendon Reflexes: 1+ and symmetric throughout Plantars: Right: downgoing   Left: downgoing Cerebellar: normal finger-to-nose      Laboratory Studies:   Basic Metabolic Panel: Recent Labs  Lab 09/16/19 1251 09/17/19 0745  NA 138 140  K 4.7 4.5  CL 103 107  CO2 24 22  GLUCOSE 101* 74  BUN 26* 24*  CREATININE 1.52* 1.12  CALCIUM 9.2 8.8*    Liver Function Tests: No results for input(s): AST, ALT, ALKPHOS, BILITOT, PROT, ALBUMIN in the last 168 hours. No results for input(s): LIPASE, AMYLASE in the last 168 hours. No results for input(s): AMMONIA in the last 168 hours.  CBC: Recent Labs  Lab 09/16/19 1251 09/17/19 0745  WBC 5.3 7.4  NEUTROABS 4.2  --   HGB 11.8* 12.4*  HCT 35.3* 38.5*  MCV 93.6 95.8  PLT 171 174    Cardiac Enzymes: No results for input(s): CKTOTAL, CKMB, CKMBINDEX, TROPONINI in the last 168 hours.  BNP: Invalid input(s): POCBNP  CBG: No results for input(s): GLUCAP in the last 168 hours.  Microbiology: Results for orders placed or performed during the hospital encounter of 09/16/19  SARS Coronavirus 2 by RT PCR (hospital order, performed in [facility] hospital lab) Nasopharyngeal Nasopharyngeal Swab     Status: None   Collection Time: 09/16/19  2:24 PM   Specimen: Nasopharyngeal Swab  Result Value Ref Range Status   SARS Coronavirus 2 NEGATIVE NEGATIVE Final    Comment: (NOTE) SARS-CoV-2 target nucleic acids are NOT DETECTED. The SARS-CoV-2 RNA is generally detectable in upper and lower respiratory specimens during the acute phase of infection. The lowest concentration of SARS-CoV-2 viral copies this assay can detect is 250 copies / mL. A negative result does not preclude SARS-CoV-2 infection and should not be used as the sole basis for treatment or other patient management  decisions.  A negative result may occur with improper specimen collection / handling, submission of specimen other than nasopharyngeal swab, presence of viral mutation(s) within the areas targeted by this assay, and inadequate number of viral copies (<250 copies / mL). A negative result must be combined with clinical observations, patient history, and epidemiological information. Fact Sheet for Patients:   StrictlyIdeas.no Fact Sheet for Healthcare Providers: BankingDealers.co.za This test is not yet approved or cleared  by the [place] FDA and has been authorized for detection and/or diagnosis of SARS-CoV-2 by FDA under an Emergency Use Authorization (EUA).  This EUA will remain in effect (meaning this test can be used) for the duration of the COVID-19 declaration under Section 564(b)(1) of the Act, 21 U.S.C. section 360bbb-3(b)(1), unless the authorization is terminated or revoked sooner. Performed at [facility] [address], [place], [place] [address]     Coagulation Studies: Recent Labs    09/16/19 1251  LABPROT 13.1  INR 1.0    Urinalysis:  Recent Labs  Lab 09/16/19 1659  COLORURINE YELLOW*  LABSPEC 1.013  PHURINE 5.0  GLUCOSEU NEGATIVE  HGBUR NEGATIVE  BILIRUBINUR NEGATIVE  KETONESUR 5*  PROTEINUR NEGATIVE  NITRITE NEGATIVE  LEUKOCYTESUR NEGATIVE    Lipid Panel:     Component Value Date/Time   CHOL 111 09/17/2019 0538   CHOL 116 06/14/2017 1618   TRIG 60 09/17/2019 0538   HDL 42 09/17/2019 0538   HDL 53 06/14/2017 1618   CHOLHDL 2.6 09/17/2019 0538   VLDL 12 09/17/2019 0538   LDLCALC 57 09/17/2019 0538   LDLCALC 44 06/14/2017 1618    HgbA1C: No results found for: HGBA1C  Urine Drug Screen:  No results found for: LABOPIA, COCAINSCRNUR, LABBENZ, AMPHETMU, THCU, LABBARB  Alcohol Level: No results for input(s): ETH in the last 168 hours.    Imaging: DG Chest 2 View  Result Date:  09/16/2019 CLINICAL DATA:  Patient status post fall. EXAM: CHEST - 2 VIEW COMPARISON:  Chest radiograph 05/28/2019. FINDINGS: Stable multi lead pacer apparatus overlying the left hemithorax. Stable cardiomegaly. Small bilateral pleural effusions. Underlying pulmonary consolidation. Thoracic spine degenerative changes. Postsurgical changes proximal left humerus. IMPRESSION: Small effusions with underlying consolidation which may represent atelectasis or infection. Electronically Signed   By: Lovey Newcomer M.D.   On: 09/16/2019 19:13   CT HEAD WO CONTRAST  Result Date: 09/17/2019 CLINICAL DATA:  Episode of altered cognition with expressive aphasia. EXAM: CT HEAD WITHOUT CONTRAST TECHNIQUE: Contiguous axial images were obtained from the base of the skull through the vertex without intravenous contrast. COMPARISON:  CT scan 09/16/2019 FINDINGS: Brain: Stable age related cerebral atrophy, ventriculomegaly and periventricular white matter disease. No extra-axial fluid collections are identified. No CT findings for acute hemispheric infarction or intracranial hemorrhage. No mass lesions. The brainstem and cerebellum are normal. Vascular: Stable vascular calcifications. No aneurysm or hyperdense vessels. Skull: No skull fracture or bone lesions. Sinuses/Orbits: The paranasal sinuses and mastoid air cells are clear. The globes are intact. Other: No scalp lesions or hematoma. IMPRESSION: 1. Stable age related cerebral atrophy, ventriculomegaly and periventricular white matter disease. 2. No acute intracranial findings or mass lesions. Electronically Signed   By: Marijo Sanes M.D.   On: 09/17/2019 06:46   CT Head Wo Contrast  Result Date: 09/16/2019 CLINICAL DATA:  Fall on 09/14/2019. Negative CT at that time. Patient had an episode of altered cognition with stuttering and expressive aphasia this morning. EXAM: CT HEAD WITHOUT CONTRAST TECHNIQUE: Contiguous axial images were obtained from the base of the skull through  the vertex without intravenous contrast. COMPARISON:  09/14/2019 FINDINGS: Brain: No evidence of acute infarction, hemorrhage, hydrocephalus, extra-axial collection or mass lesion/mass effect. There is ventricular and sulcal enlargement reflecting mild diffuse atrophy. Patchy white matter hypoattenuation is also noted consistent with mild chronic microvascular ischemic change. These findings are stable. Vascular: No hyperdense vessel or unexpected calcification. Skull: Normal. Negative for fracture or focal lesion. Sinuses/Orbits: Globes and orbits are unremarkable. Visualized sinuses and mastoid air cells are clear. Other: None. IMPRESSION: 1. No acute intracranial abnormalities. 2. Atrophy and chronic microvascular ischemic change stable from the prior study. Electronically Signed   By: Lajean Manes M.D.   On: 09/16/2019 13:46     Assessment/Plan:  86 y.o. male with medical history significant for sinus bradycardia status post dual-chamber pacemaker (07/13/2017), CAD status post multiple coronary stents/CABG (2007), HTN, HLD, COVID-19 pneumonia (requiring hospitalization at [facility] from 1/26-1/30/2021) who presents on 09/16/2019 with sudden difficulty  in speech.  CTH no acute abnormalities done x 2. Unable to obtain MRI to to [facility]   - On plavix at home for CAD. Will add ASA 81mg  - Pt is at high risk for strokes given the co morbidities. Despite normal CTH x2 still possibility of small subcortical ischemia.   - pt/ot  - Pt states he speech is at baseline but it is difficult to understand him - d/c planning based on pt/ot 09/17/2019, 12:02 PM

## 2019-09-17 NOTE — Consult Note (Addendum)
Physical Medicine and Rehabilitation Consult Reason for Consult:Impaired speech and mobility following stroke Referring Physician: Oretha Milch, MD Referring Therapist: Tera Helper, PT   HPI: Ivan Salazar is a 86 y.o. male with PMH of sinus bradycardia s/p dual chamber pacemaker (06/2017), CAD s/p multiple coronary stents/CBAG (2007), HTN, HLD, COVID-19 pneumonia (hospitalized at [facility] in January of 2021), who presented on 09/16/19 with sudden difficulty in speech. 2 days prior he had tripped on a curb and fell on his right forehead. Patient lives by himself and notes that on the day prior to admission he woke up and noticed difficulty with words. His neighbor checked on him and noted stuttering. Patient was complaint with his Plavix, Toprol, and Lisinopril at home. CTH showed no acute abnormalities and MRI unobtainable due to PPM.   ROS  +stuttering, aphasia, weakness, verbosity Has an appetite, but has not eaten any of his food. No BM.  ROS difficult to obtain due to aphasia.   Past Medical History:  Diagnosis Date  . BPH (benign prostatic hyperplasia)   . CAD (coronary artery disease)   . Carpal tunnel syndrome   . DDD (degenerative disc disease), cervical   . Environmental and seasonal allergies   . Fall 09/14/2019   Fell on curb, injured left knee and hit right forehead  . GERD (gastroesophageal reflux disease)   . Glaucoma   . Impotence   . Nocturia   . Presence of permanent cardiac pacemaker   . Weight loss    Past Surgical History:  Procedure Laterality Date  . APPENDECTOMY    . BALLOON DILATION N/A 11/05/2018   Procedure: BALLOON DILATION;  Surgeon: Manya Silvas, MD;  Location: [facility] ENDOSCOPY;  Service: Endoscopy;  Laterality: N/A;  . BILATERAL CARPAL TUNNEL RELEASE     05/12/2011, 06/09/2011  . CARDIAC CATHETERIZATION  2004  . coils in vessel     6 coils  . CORONARY ANGIOPLASTY     x 5  . CORONARY ARTERY BYPASS GRAFT  2007   5; [facility]  . CORONARY  STENT PLACEMENT  2005  . ESOPHAGOGASTRODUODENOSCOPY (EGD) WITH PROPOFOL N/A 10/28/2015   Procedure: ESOPHAGOGASTRODUODENOSCOPY (EGD) WITH PROPOFOL;  Surgeon: Manya Silvas, MD;  Location: [facility] ENDOSCOPY;  Service: Endoscopy;  Laterality: N/A;  . ESOPHAGOGASTRODUODENOSCOPY (EGD) WITH PROPOFOL N/A 11/05/2018   Procedure: ESOPHAGOGASTRODUODENOSCOPY (EGD) WITH PROPOFOL;  Surgeon: Manya Silvas, MD;  Location: [facility] ENDOSCOPY;  Service: Endoscopy;  Laterality: N/A;  . KIDNEY STONE SURGERY    . PACEMAKER INSERTION N/A 07/13/2017   Procedure: INSERTION PACEMAKER;  Surgeon: Isaias Cowman, MD;  Location: [facility] ORS;  Service: Cardiovascular;  Laterality: N/A;  . SHOULDER ARTHROSCOPY W/ ROTATOR CUFF REPAIR Bilateral   . TONSILLECTOMY    . UPPER GI ENDOSCOPY  08/02/2010   Dr. Tedra Coupe, gastritis. H Pylori negative   Family History  Problem Relation Age of Onset  . Cirrhosis Father        of liver  . Cancer Daughter   . Kidney disease Neg Hx   . Prostate cancer Neg Hx   . Kidney cancer Neg Hx   . Bladder Cancer Neg Hx    Social History:  reports that he quit smoking about 42 years ago. He has a 10.00 pack-year smoking history. He has never used smokeless tobacco. He reports current alcohol use of about 7.0 - 14.0 standard drinks of alcohol per week. He reports that he does not use drugs. Allergies:  Allergies  Allergen Reactions  .  Celebrex [Celecoxib] Other (See Comments)    Foul taste in mouth   . Mirtazapine Other (See Comments)    Foul taste in mouth   Medications Prior to Admission  Medication Sig Dispense Refill  . atorvastatin (LIPITOR) 80 MG tablet TAKE ONE TABLET BY MOUTH DAILY (Patient taking differently: Take 80 mg by mouth daily at 6 PM. ) 90 tablet 3  . clopidogrel (PLAVIX) 75 MG tablet TAKE ONE TABLET BY MOUTH DAILY (Patient taking differently: Take 75 mg by mouth daily. ) 90 tablet 2  . enalapril (VASOTEC) 5 MG tablet TAKE ONE TABLET BY MOUTH DAILY (Patient taking  differently: Take 5 mg by mouth daily. ) 90 tablet 3  . fluticasone (FLONASE) 50 MCG/ACT nasal spray SPRAY TWO SPRAYS IN EACH NOSTRIL ONCE DAILY (Patient taking differently: Place 2 sprays into both nostrils daily. ) 16 mL 3  . guaiFENesin-codeine 100-10 MG/5ML syrup TAKE ONE TO TWO TEASPOONSFUL (5-10 MILLILITERS) BY MOUTH EVERY SIX HOURS AS NEEDED FOR COUGH (Patient taking differently: Take 5-10 mLs by mouth every 6 (six) hours as needed for cough. ) 120 mL 3  . HYDROcodone-acetaminophen (NORCO) 7.5-325 MG tablet 1 tablet every 4-6 hours as needed 180 tablet 0  . latanoprost (XALATAN) 0.005 % ophthalmic solution Place 1 drop into both eyes at bedtime.     . metoprolol succinate (TOPROL-XL) 25 MG 24 hr tablet TAKE ONE TABLET BY MOUTH DAILY (Patient taking differently: Take 25 mg by mouth daily. ) 90 tablet 4  . mupirocin cream (BACTROBAN) 2 % Apply to affected area 3 times daily 30 g 0  . testosterone cypionate (DEPOTESTOSTERONE CYPIONATE) 200 MG/ML injection Inject 1 mL (200 mg total) into the muscle every 28 (twenty-eight) days. 3 mL 0  . zolpidem (AMBIEN) 10 MG tablet Take 0.5-1 tablets (5-10 mg total) by mouth at bedtime. TAKE 1/2 TO 1 TABLET EVERY NIGHT AT BEDTIME (Patient taking differently: Take 5-10 mg by mouth at bedtime. ) 30 tablet 2  . levocetirizine (XYZAL) 5 MG tablet Take 1 tablet (5 mg total) by mouth every evening. (Patient not taking: Reported on 09/16/2019) 30 tablet 12    Home: [place] expects to be discharged to:: Private residence Living Arrangements: Alone Type of Home: Apartment Home Access: Level entry Home Layout: One level Home Equipment: Walker - standard Additional Comments: previous therapy evaluation states 2WW, but pt states the walker he has does not have wheels. Pt with speech difficulties on this admission so unsure of efficacy.  Functional History: Prior Function Level of Independence: Independent Comments: Per previous evaluation when pt  was adm with COVID 05/2019: Indep with ADLs, household and community mobilization without assist device; has access to RW if needed. + driving; no falls; no home O2.  Walks at mall (at least 1 mile) 3x/week. OT asks pt to verify this information, pt endroses that he lives in [address] apartments and has for 5 years, as far as whether he still drives or walks for exercise-this part of conversation is unclear and cannot be confirmed Functional Status:  Mobility: Bed Mobility Overal bed mobility: Needs Assistance Bed Mobility: Supine to Sit Supine to sit: Min assist, Mod assist, HOB elevated Transfers Overall transfer level: Needs assistance Equipment used: Rolling walker (2 wheeled) Transfers: Sit to/from Stand, Stand Pivot Transfers Sit to Stand: From elevated surface, Mod assist, Max assist Stand pivot transfers: From elevated surface, Max assist General transfer comment: Pt requires MOD verbal/tactile cues for safe hand placement. Demos decreased motor planning, benefits from  count-to-three to stand. Demos flexed hips requiring cues to extend, unsuccessfully. Pt on SPS stops halfway between bed/chair requiring MAX tactile cue/assist to rotate his bottom toward recliner for safety. Pt unable to describe how/why this happened once safely to chair.      ADL: ADL Overall ADL's : Needs assistance/impaired Eating/Feeding: NPO Eating/Feeding Details (indicate cue type and reason): anticipate MIN A with this based on strength/ROM/cue following Grooming: Wash/dry hands, Oral care, Minimal assistance, Sitting, Cueing for sequencing Grooming Details (indicate cue type and reason): cue to initiate oral care and washing face, appears to not recognize/understand use of ADL objects requiring tactile/verbal cue and hand over hand to initiate task-some potential agnosia. Upper Body Dressing : Moderate assistance, Sitting Lower Body Dressing: Moderate assistance, Maximal assistance, Sitting/lateral leans Lower  Body Dressing Details (indicate cue type and reason): to thread socks, appears to have difficulty with motor planning-Ex: able to bend knees, but cannot sequence bending the knee to make reaching foot easier. Toilet Transfer: Moderate assistance, Maximal assistance, RW, Stand-pivot Toilet Transfer Details (indicate cue type and reason): based on obs of t/f to chair Toileting- Clothing Manipulation and Hygiene: Maximal assistance, Bed level Toileting - Clothing Manipulation Details (indicate cue type and reason): pt's bed soiled with urine when OT presents and pt does not appear aware. Functional mobility during ADLs: (pt with difficulty sequencing/motor planning to safely take steps.)  Cognition: Cognition Overall Cognitive Status: Difficult to assess Orientation Level: Oriented X4 Cognition Arousal/Alertness: Awake/alert Behavior During Therapy: WFL for tasks assessed/performed, Impulsive Overall Cognitive Status: Difficult to assess General Comments: Pt with expressive difficulties at this time, lmiting amount of cog assessment able to be completed, does correctly state place, month and year. Some impulsivity demo'ed and some potential agnosia (doesn't recognize purpose/use of toothbrush) Difficult to assess due to: Impaired communication  Blood pressure (!) 152/52, pulse 72, temperature 98.2 F (36.8 C), temperature source Oral, resp. rate 17, height 5\' 10"  (1.778 m), weight 62.1 kg, SpO2 98 %.   Physical Exam  General: Alert and oriented x 2, No apparent distress HEENT: Head is normocephalic, atraumatic, PERRLA, EOMI, sclera anicteric, oral mucosa pink and moist, ext ear canals clear, poor dentition Neck: Supple without JVD or lymphadenopathy Heart: Reg rate and rhythm. No murmurs rubs or gallops Chest: CTA bilaterally without wheezes, rales, or rhonchi; no distress Abdomen: Soft, non-tender, non-distended, bowel sounds positive. Extremities: No clubbing, cyanosis, or edema. Pulses  are 2+ Skin: Crusted blood on right forehead Neuro: Pt is cognitively appropriate with normal insight, memory, and awareness. Cranial nerves 2-12 are intact. Stuttering. Expressive >receptive aphasia. Quite difficult to understand, but has moments of clear speech. Was able to state his name as "Alexxander Brindle and always will feel [place]." 4-/5 strength throughout.  Psych: Pt's affect is appropriate. Pt is cooperative. Verbosity of speech.   Results for orders placed or performed during the hospital encounter of 09/16/19 (from the past 24 hour(s))  Urinalysis, Complete w Microscopic     Status: Abnormal   Collection Time: 09/16/19  4:59 PM  Result Value Ref Range   Color, Urine YELLOW (A) YELLOW   APPearance CLEAR (A) CLEAR   Specific Gravity, Urine 1.013 1.005 - 1.030   pH 5.0 5.0 - 8.0   Glucose, UA NEGATIVE NEGATIVE mg/dL   Hgb urine dipstick NEGATIVE NEGATIVE   Bilirubin Urine NEGATIVE NEGATIVE   Ketones, ur 5 (A) NEGATIVE mg/dL   Protein, ur NEGATIVE NEGATIVE mg/dL   Nitrite NEGATIVE NEGATIVE   Leukocytes,Ua NEGATIVE NEGATIVE  RBC / HPF 0-5 0 - 5 RBC/hpf   WBC, UA 0-5 0 - 5 WBC/hpf   Bacteria, UA NONE SEEN NONE SEEN   Squamous Epithelial / LPF NONE SEEN 0 - 5   Mucus PRESENT    Hyaline Casts, UA PRESENT   Lipid panel     Status: None   Collection Time: 09/17/19  5:38 AM  Result Value Ref Range   Cholesterol 111 0 - 200 mg/dL   Triglycerides 60 <150 mg/dL   HDL 42 >40 mg/dL   Total CHOL/HDL Ratio 2.6 RATIO   VLDL 12 0 - 40 mg/dL   LDL Cholesterol 57 0 - 99 mg/dL  Basic metabolic panel     Status: Abnormal   Collection Time: 09/17/19  7:45 AM  Result Value Ref Range   Sodium 140 135 - 145 mmol/L   Potassium 4.5 3.5 - 5.1 mmol/L   Chloride 107 98 - 111 mmol/L   CO2 22 22 - 32 mmol/L   Glucose, Bld 74 70 - 99 mg/dL   BUN 24 (H) 8 - 23 mg/dL   Creatinine, Ser 1.12 0.61 - 1.24 mg/dL   Calcium 8.8 (L) 8.9 - 10.3 mg/dL   GFR calc non Af Amer 59 (L) >60 mL/min   GFR calc Af Amer  >60 >60 mL/min   Anion gap 11 5 - 15  CBC     Status: Abnormal   Collection Time: 09/17/19  7:45 AM  Result Value Ref Range   WBC 7.4 4.0 - 10.5 K/uL   RBC 4.02 (L) 4.22 - 5.81 MIL/uL   Hemoglobin 12.4 (L) 13.0 - 17.0 g/dL   HCT 38.5 (L) 39.0 - 52.0 %   MCV 95.8 80.0 - 100.0 fL   MCH 30.8 26.0 - 34.0 pg   MCHC 32.2 30.0 - 36.0 g/dL   RDW 13.6 11.5 - 15.5 %   Platelets 174 150 - 400 K/uL   nRBC 0.0 0.0 - 0.2 %   DG Chest 2 View  Result Date: 09/16/2019 CLINICAL DATA:  Patient status post fall. EXAM: CHEST - 2 VIEW COMPARISON:  Chest radiograph 05/28/2019. FINDINGS: Stable multi lead pacer apparatus overlying the left hemithorax. Stable cardiomegaly. Small bilateral pleural effusions. Underlying pulmonary consolidation. Thoracic spine degenerative changes. Postsurgical changes proximal left humerus. IMPRESSION: Small effusions with underlying consolidation which may represent atelectasis or infection. Electronically Signed   By: Lovey Newcomer M.D.   On: 09/16/2019 19:13   CT HEAD WO CONTRAST  Result Date: 09/17/2019 CLINICAL DATA:  Episode of altered cognition with expressive aphasia. EXAM: CT HEAD WITHOUT CONTRAST TECHNIQUE: Contiguous axial images were obtained from the base of the skull through the vertex without intravenous contrast. COMPARISON:  CT scan 09/16/2019 FINDINGS: Brain: Stable age related cerebral atrophy, ventriculomegaly and periventricular white matter disease. No extra-axial fluid collections are identified. No CT findings for acute hemispheric infarction or intracranial hemorrhage. No mass lesions. The brainstem and cerebellum are normal. Vascular: Stable vascular calcifications. No aneurysm or hyperdense vessels. Skull: No skull fracture or bone lesions. Sinuses/Orbits: The paranasal sinuses and mastoid air cells are clear. The globes are intact. Other: No scalp lesions or hematoma. IMPRESSION: 1. Stable age related cerebral atrophy, ventriculomegaly and periventricular white  matter disease. 2. No acute intracranial findings or mass lesions. Electronically Signed   By: Marijo Sanes M.D.   On: 09/17/2019 06:46   CT Head Wo Contrast  Result Date: 09/16/2019 CLINICAL DATA:  Fall on 09/14/2019. Negative CT at that time. Patient had  an episode of altered cognition with stuttering and expressive aphasia this morning. EXAM: CT HEAD WITHOUT CONTRAST TECHNIQUE: Contiguous axial images were obtained from the base of the skull through the vertex without intravenous contrast. COMPARISON:  09/14/2019 FINDINGS: Brain: No evidence of acute infarction, hemorrhage, hydrocephalus, extra-axial collection or mass lesion/mass effect. There is ventricular and sulcal enlargement reflecting mild diffuse atrophy. Patchy white matter hypoattenuation is also noted consistent with mild chronic microvascular ischemic change. These findings are stable. Vascular: No hyperdense vessel or unexpected calcification. Skull: Normal. Negative for fracture or focal lesion. Sinuses/Orbits: Globes and orbits are unremarkable. Visualized sinuses and mastoid air cells are clear. Other: None. IMPRESSION: 1. No acute intracranial abnormalities. 2. Atrophy and chronic microvascular ischemic change stable from the prior study. Electronically Signed   By: Lajean Manes M.D.   On: 09/16/2019 13:46   US Carotid Bilateral (at [facility] and [facility] only)  Result Date: 09/17/2019 CLINICAL DATA:  86 year old male with symptoms of transient ischemic attack EXAM: BILATERAL CAROTID DUPLEX ULTRASOUND TECHNIQUE: Pearline Cables scale imaging, color Doppler and duplex ultrasound were performed of bilateral carotid and vertebral arteries in the neck. COMPARISON:  None. FINDINGS: Criteria: Quantification of carotid stenosis is based on velocity parameters that correlate the residual internal carotid diameter with NASCET-based stenosis levels, using the diameter of the distal internal carotid lumen as the denominator for stenosis measurement. The following  velocity measurements were obtained: RIGHT ICA: 89/11 cm/sec CCA: [id number] cm/sec SYSTOLIC ICA/CCA RATIO:  0.8 ECA:  202 cm/sec LEFT ICA: 109/18 cm/sec CCA: [id number] cm/sec SYSTOLIC ICA/CCA RATIO:  1.3 ECA:  241 cm/sec RIGHT CAROTID ARTERY: Heterogeneous atherosclerotic plaque at the carotid bifurcation extending into the proximal internal and external carotid arteries. By peak systolic velocity criteria, the internal carotid artery stenosis is less than 50%. RIGHT VERTEBRAL ARTERY:  Patent with antegrade flow. LEFT CAROTID ARTERY: Heterogeneous atherosclerotic plaque in the carotid bifurcation extending into the proximal internal and external carotid arteries. By peak systolic velocity criteria, the estimated stenosis remains less than 50%. LEFT VERTEBRAL ARTERY:  Patent with normal antegrade flow. IMPRESSION: 1. Mild (1-49%) stenosis proximal right internal carotid artery secondary to heterogenous atherosclerotic plaque. 2. Mild (1-49%) stenosis proximal left internal carotid artery secondary to heterogenous atherosclerotic plaque. 3. The vertebral arteries are patent with normal antegrade flow. Signed, Criselda Peaches, MD, [facility] Electronically Signed   By: Jacqulynn Cadet M.D.   On: 09/17/2019 12:15     Assessment/Plan: Diagnosis: TIA with aphasia and word finding difficulty 1. Does the need for close, 24 hr/day medical supervision in concert with the patient's rehab needs make it unreasonable for this patient to be served in a less intensive setting? Yes 2. Co-Morbidities requiring supervision/potential complications: BPH, CAD, carpal tunnel syndrome, cervical disc degenerative disease, s/p fall with right forehead abraison, GERD, glaucoma, impotence, nocturia, weight loss, presence of permanent cardiac pacemaker 3. Due to bladder management, bowel management, safety, skin/wound care, disease management, medication administration, pain  management and patient education, does the patient require 24 hr/day rehab nursing? Yes 4. Does the patient require coordinated care of a physician, rehab nurse, therapy disciplines of PT, OT, SLP to address physical and functional deficits in the context of the above medical diagnosis(es)? Yes Addressing deficits in the following areas: balance, endurance, locomotion, strength, transferring, bowel/bladder control, bathing, dressing, feeding, grooming, toileting, cognition, speech, language, swallowing and psychosocial support 5. Can the patient actively participate in an intensive therapy program of at least 3 hrs of  therapy per day at least 5 days per week? Yes 6. The potential for patient to make measurable gains while on inpatient rehab is excellent 7. Anticipated functional outcomes upon discharge from inpatient rehab are supervision  with PT, supervision with OT, supervision with SLP. 8. Estimated rehab length of stay to reach the above functional goals is: 10-16 days 9. Anticipated discharge destination: Home 10. Overall Rehab/Functional Prognosis: excellent  RECOMMENDATIONS: This patient's condition is appropriate for continued rehabilitative care in the following setting: CIR Patient has agreed to participate in recommended program. Potentially Note that insurance prior authorization may be required for reimbursement for recommended care.  Comment: Mr. Tawes would be an excellent CIR candidate if we can confirm family support upon discharge. Discussed with Tera Helper, PT and Shann Medal, Inpatient Rehab Admission Coordinator.   Izora Ribas, MD 09/17/2019

## 2019-09-17 NOTE — Evaluation (Signed)
Occupational Therapy Evaluation Patient Details Name: Ivan Salazar MRN: [id number] DOB: 1932-04-23 Today's Date: 09/17/2019    History of Present Illness Pt is 86 y/o M with PMH: sinus bradycardia status post dual-chamber pacemaker (07/13/2017), CAD status post multiple coronary stents/CABG (2007), HTN, HLD, COVID-19 pneumonia (requiring hospitalization at [facility] from 1/26-1/30/2021) who presents on 09/16/2019 with sudden difficulty in speech.  Patient was seen in ED on 5/15 after tripping on a curb and falling on his right forehead, CT head at that time was nonacute and cervical spine CT also showed no acute findings. CT of head taken in ED on this admission showed no acute intracranial abnormalities, atrophy and chronic microvascular ischemic changes.   Clinical Impression   Pt was seen for OT evaluation this date. Prior to hospital admission, pt was apparently Indep with fxl mobility with no AD as well as with I/ADLs. Pt lives alone in apt with level entry. Currently pt demonstrates impairments as described below (See OT problem list) which functionally limit his ability to perform ADL/self-care tasks. Pt currently requires MOD A with several aspects of UB seated self care for cues to initiate/sequence including intermittent hand over hand (some potential agnosia vs difficulty with motor planning detected effecting sequencing ADLs) and MAX A with SPS t/f with RW to the chair. Pt with some impulsivity throughout session as well, safety education provided with moderate reception detected. Pt would benefit from skilled OT to address noted impairments and functional limitations (see below for any additional details) in order to maximize safety and independence while minimizing falls risk and caregiver burden. Upon hospital discharge, recommend STR to maximize pt safety and return to PLOF.     Follow Up Recommendations  SNF    Equipment Recommendations  3 in 1 bedside commode;Tub/shower seat     Recommendations for Other Services       Precautions / Restrictions Precautions Precautions: Fall Restrictions Weight Bearing Restrictions: No      Mobility Bed Mobility Overal bed mobility: Needs Assistance Bed Mobility: Supine to Sit     Supine to sit: Min assist;Mod assist;HOB elevated        Transfers Overall transfer level: Needs assistance Equipment used: Rolling walker (2 wheeled) Transfers: Sit to/from [facility] Sit to Stand: From elevated surface;Mod assist;Max assist Stand pivot transfers: From elevated surface;Max assist       General transfer comment: Pt requires MOD verbal/tactile cues for safe hand placement. Demos decreased motor planning, benefits from count-to-three to stand. Demos flexed hips requiring cues to extend, unsuccessfully. Pt on SPS stops halfway between bed/chair requiring MAX tactile cue/assist to rotate his bottom toward recliner for safety. Pt unable to describe how/why this happened once safely to chair.    Balance Overall balance assessment: Needs assistance Sitting-balance support: Bilateral upper extremity supported;Feet supported Sitting balance-Leahy Scale: Fair Sitting balance - Comments: L lateral lean on initial come to sit, eventually able to sustain static sit with feet squarely planted on floor.     Standing balance-Leahy Scale: Poor Standing balance comment: Pt makes some effort to sustain holding his weight in standing, but demos flexed hips and generally requires UE support and MAX A throughout t/f and static stand.                           ADL either performed or assessed with clinical judgement   ADL Overall ADL's : Needs assistance/impaired Eating/Feeding: NPO Eating/Feeding Details (indicate cue type and reason): anticipate  MIN A with this based on strength/ROM/cue following Grooming: Wash/dry hands;Oral care;Minimal assistance;Sitting;Cueing for sequencing Grooming Details (indicate  cue type and reason): cue to initiate oral care and washing face, appears to not recognize/understand use of ADL objects requiring tactile/verbal cue and hand over hand to initiate task-some potential agnosia.         Upper Body Dressing : Moderate assistance;Sitting   Lower Body Dressing: Moderate assistance;Maximal assistance;Sitting/lateral leans Lower Body Dressing Details (indicate cue type and reason): to thread socks, appears to have difficulty with motor planning-Ex: able to bend knees, but cannot sequence bending the knee to make reaching foot easier. Toilet Transfer: Moderate assistance;Maximal assistance;RW;Stand-pivot Armed forces technical officer Details (indicate cue type and reason): based on obs of t/f to chair Toileting- Clothing Manipulation and Hygiene: Maximal assistance;Bed level Toileting - Clothing Manipulation Details (indicate cue type and reason): pt's bed soiled with urine when OT presents and pt does not appear aware.     Functional mobility during ADLs: (pt with difficulty sequencing/motor planning to safely take steps.)       Vision Baseline Vision/History: Wears glasses Patient Visual Report: Diplopia;No change from baseline Additional Comments: Pt reports having double vision at baseline, difficult to understand d/t pt's speech, but reports some history of cataract surgery and infection, needing a prism on L superior aspect of his glasses.     Perception     Praxis      Pertinent Vitals/Pain Pain Assessment: No/denies pain     Hand Dominance Right   Extremity/Trunk Assessment Upper Extremity Assessment Upper Extremity Assessment: RUE deficits/detail;LUE deficits/detail RUE Deficits / Details: pt reports hx rotator cuff issues, shld flexion to ~1/3 arc of motion, grip 3+/5 LUE Deficits / Details: pt reports hx rotator cuff issues, shld flexion to ~1/5 arc of motion, grip 4-/5   Lower Extremity Assessment Lower Extremity Assessment: Defer to PT  evaluation;Generalized weakness       Communication Communication Communication: HOH;Expressive difficulties(expressive aphasia)   Cognition Arousal/Alertness: Awake/alert Behavior During Therapy: WFL for tasks assessed/performed;Impulsive Overall Cognitive Status: Difficult to assess                                 General Comments: Pt with expressive difficulties at this time, lmiting amount of cog assessment able to be completed, does correctly state place, month and year. Some impulsivity demo'ed and some potential agnosia (doesn't recognize purpose/use of toothbrush)   General Comments       Exercises Other Exercises Other Exercises: OT facilitates education re: role of OT in acute setting. Pt with moderate reception detected. Other Exercises: OT facilitates education re: fall prevention including fall alarm and use of call light. Pt with moderate reception, but some impulsivity-RN notified.   Shoulder Instructions      Home Living Family/patient expects to be discharged to:: Private residence Living Arrangements: Alone   Type of Home: Apartment Home Access: Level entry     Home Layout: One level               Home Equipment: Walker - standard   Additional Comments: previous therapy evaluation states 2WW, but pt states the walker he has does not have wheels. Pt with speech difficulties on this admission so unsure of efficacy.      Prior Functioning/Environment Level of Independence: Independent        Comments: Per previous evaluation when pt was adm with COVID 05/2019: Indep with ADLs, household and community  mobilization without assist device; has access to RW if needed. + driving; no falls; no home O2.  Walks at mall (at least 1 mile) 3x/week. OT asks pt to verify this information, pt endroses that he lives in [place] apartments and has for 5 years, as far as whether he still drives or walks for exercise-this part of conversation is unclear and  cannot be confirmed        OT Problem List: Decreased strength;Decreased range of motion;Decreased activity tolerance;Impaired balance (sitting and/or standing);Impaired vision/perception;Decreased coordination;Decreased safety awareness;Decreased knowledge of use of DME or AE      OT Treatment/Interventions: Self-care/ADL training;Therapeutic exercise;DME and/or AE instruction;Therapeutic activities;Patient/family education;Balance training    OT Goals(Current goals can be found in the care plan section) Acute Rehab OT Goals Patient Stated Goal: pt with difficulty expressing this, but ultimately states "to move like I was before" OT Goal Formulation: With patient Time For Goal Achievement: 10/01/19 Potential to Achieve Goals: Good  OT Frequency: Min 2X/week   Barriers to D/C: Decreased caregiver support          Co-evaluation              AM-PAC OT "6 Clicks" Daily Activity     Outcome Measure Help from another person eating meals?: A Little Help from another person taking care of personal grooming?: A Little Help from another person toileting, which includes using toliet, bedpan, or urinal?: Total Help from another person bathing (including washing, rinsing, drying)?: A Lot Help from another person to put on and taking off regular upper body clothing?: A Lot Help from another person to put on and taking off regular lower body clothing?: A Lot 6 Click Score: 13   End of Session Equipment Utilized During Treatment: Gait belt;Rolling walker Nurse Communication: Mobility status  Activity Tolerance: Patient tolerated treatment well Patient left: in chair;with call bell/phone within reach;with chair alarm set  OT Visit Diagnosis: Unsteadiness on feet (R26.81);Other abnormalities of gait and mobility (R26.89);Muscle weakness (generalized) (M62.81);History of falling (Z91.81)                Time: [id number] OT Time Calculation (min): 68 min Charges:  OT General Charges $OT  Visit: 1 Visit OT Evaluation $OT Eval Moderate Complexity: 1 Mod OT Treatments $Self Care/Home Management : 23-37 mins $Therapeutic Activity: 23-37 mins  Gerrianne Scale, MS, OTR/L ascom [phone] 09/17/19, 11:58 AM

## 2019-09-17 NOTE — Evaluation (Signed)
Clinical/Bedside Swallow Evaluation Patient Details  Name: Ivan Salazar MRN: [id number] Date of Birth: Apr 11, 1932  Today's Date: 09/17/2019 Time: SLP Start Time (ACUTE ONLY): [id number] SLP Stop Time (ACUTE ONLY): 1433 SLP Time Calculation (min) (ACUTE ONLY): 60 min  Past Medical History:  Past Medical History:  Diagnosis Date  . BPH (benign prostatic hyperplasia)   . CAD (coronary artery disease)   . Carpal tunnel syndrome   . DDD (degenerative disc disease), cervical   . Environmental and seasonal allergies   . Fall 09/14/2019   Fell on curb, injured left knee and hit right forehead  . GERD (gastroesophageal reflux disease)   . Glaucoma   . Impotence   . Nocturia   . Presence of permanent cardiac pacemaker   . Weight loss    Past Surgical History:  Past Surgical History:  Procedure Laterality Date  . APPENDECTOMY    . BALLOON DILATION N/A 11/05/2018   Procedure: BALLOON DILATION;  Surgeon: Manya Silvas, MD;  Location: [facility] ENDOSCOPY;  Service: Endoscopy;  Laterality: N/A;  . BILATERAL CARPAL TUNNEL RELEASE     05/12/2011, 06/09/2011  . CARDIAC CATHETERIZATION  2004  . coils in vessel     6 coils  . CORONARY ANGIOPLASTY     x 5  . CORONARY ARTERY BYPASS GRAFT  2007   5; [facility]  . CORONARY STENT PLACEMENT  2005  . ESOPHAGOGASTRODUODENOSCOPY (EGD) WITH PROPOFOL N/A 10/28/2015   Procedure: ESOPHAGOGASTRODUODENOSCOPY (EGD) WITH PROPOFOL;  Surgeon: Manya Silvas, MD;  Location: [facility] ENDOSCOPY;  Service: Endoscopy;  Laterality: N/A;  . ESOPHAGOGASTRODUODENOSCOPY (EGD) WITH PROPOFOL N/A 11/05/2018   Procedure: ESOPHAGOGASTRODUODENOSCOPY (EGD) WITH PROPOFOL;  Surgeon: Manya Silvas, MD;  Location: [facility] ENDOSCOPY;  Service: Endoscopy;  Laterality: N/A;  . KIDNEY STONE SURGERY    . PACEMAKER INSERTION N/A 07/13/2017   Procedure: INSERTION PACEMAKER;  Surgeon: Isaias Cowman, MD;  Location: [facility] ORS;  Service: Cardiovascular;  Laterality: N/A;  . SHOULDER ARTHROSCOPY W/  ROTATOR CUFF REPAIR Bilateral   . TONSILLECTOMY    . UPPER GI ENDOSCOPY  08/02/2010   Dr. Tedra Coupe, gastritis. H Pylori negative   HPI:  Per admitting H and P: "Ivan Salazar is a 86 y.o. male with medical history significant for sinus bradycardia status post dual-chamber pacemaker (07/13/2017), CAD status post multiple coronary stents/CABG (2007), HTN, HLD, COVID-19 pneumonia (requiring hospitalization at [facility] from 1/26-1/30/2021) who presents on 09/16/2019 with sudden difficulty in speech.  Patient was seen in ED on 5/15 after tripping on a curb and falling on his right forehead, CT head at that time was nonacute and cervical spine CT also showed no acute findings.  Patient has difficulty presenting history due to her finding difficulty for further history obtained from patient supplemented by chart review.  He states that around 9 PM on 5/16 at his baseline but he got up in the middle of the night last night and felt unlike himself and reportedly notices difficulty with words at that time.  His neighbor came to check on him him this morning around 10 and noticed he was having stuttering.  Patient denies any further falls, no new weakness or numbness, no chest pain, no abdominal pain, no fevers or chills.   Assessment / Plan / Recommendation Clinical Impression  Pt presents with mostly functional swallowing abilities at bedside with no overt s/s of aspiration with any tested consistency today. Pt reported he was thirsty from being NPO but then only took a few bites and sips  reporting he wanted to go home to rest and was waiting for his discharge papers. Mild oral transit delay with solids but Pt cleared oral cavity adequately. Vocal quality remained clear, laryngeal elevation appeared adequate. Pt does have a history of Schatzki ring which he has had dilated in the past 1-2 years. He reports that it is not currently giving him problems with swallowing. Pt also presents with dysfluent speech, needing extended  time to communicate. He reports that this is new and his speech was fine prior to this event. 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 Pt will also need Speech and Language eval and treat. ST services recommended at discharge. ST to follow up with toleration of diet and proceed with Language eval as appropriate SLP Visit Diagnosis: Dysphagia, oropharyngeal phase (R13.12)    Aspiration Risk  Mild aspiration risk    Diet Recommendation Dysphagia 3 (Mech soft)   Medication Administration: Whole meds with puree Supervision: Staff to assist with self feeding Compensations: Small sips/bites;Slow rate Postural Changes: Seated upright at 90 degrees    Other  Recommendations     Follow up Recommendations Other (comment)(Rehab, will defer to PT and OT recs. Needs ST at discharge)      Frequency and Duration min 3x week  1 week       Prognosis   Very Good     Swallow Study   General Date of Onset: 09/16/19 HPI: Per admitting H and P: "Ivan Salazar is a 86 y.o. male with medical history significant for sinus bradycardia status post dual-chamber pacemaker (07/13/2017), CAD status post multiple coronary stents/CABG (2007), HTN, HLD, COVID-19 pneumonia (requiring hospitalization at [facility] from 1/26-1/30/2021) who presents on 09/16/2019 with sudden difficulty in speech.  Patient was seen in ED on 5/15 after tripping on a curb and falling on his right forehead, CT head at that time was nonacute and cervical spine CT also showed no acute findings.  Patient has difficulty presenting history due to her finding difficulty for further history obtained from patient supplemented by chart review.  He states that around 9 PM on 5/16 at his baseline but he got up in the middle of the night last night and felt unlike himself and reportedly notices difficulty with words at that time.  His neighbor came to  check on him him this morning around 10 and noticed he was having stuttering.  Patient denies any further falls, no new weakness or numbness, no chest pain, no abdominal pain, no fevers or chills. Type of Study: Bedside Swallow Evaluation Diet Prior to this Study: NPO Respiratory Status: Room air History of Recent Intubation: No Behavior/Cognition: Alert;Cooperative Oral Cavity Assessment: Within Functional Limits Oral Cavity - Dentition: Adequate natural dentition Self-Feeding Abilities: Needs assist Patient Positioning: Upright in bed Baseline Vocal Quality: Normal;Other (comment)(Dysfluency)    Oral/Motor/Sensory Function Overall Oral Motor/Sensory Function: Within functional limits   Ice Chips Ice chips: Within functional limits   Thin Liquid Thin Liquid: Within functional limits Presentation: Cup;Self Fed;Spoon;Straw    Nectar Thick     Honey Thick     Puree Puree: Within functional limits Presentation: Spoon   Solid     Solid: Impaired(Dealyed oral transit) Presentation: Self Fed Oral Phase Functional Implications: Prolonged oral transit Pharyngeal Phase Impairments: Other (comments)(Has History of Scahtzki ring)      Lucila Maine 09/17/2019,2:34 PM

## 2019-09-17 NOTE — Progress Notes (Signed)
[facility]  PROGRESS NOTE  CHAI WEAGLE [id number] DOB: [birth year] DOA: 09/16/2019 PCP: Birdie Sons, MD Admit date - 09/16/2019   Admitting Physician Desiree Hane, MD  Outpatient Primary MD for the patient is Birdie Sons, MD  LOS - 0 Brief Narrative   Ivan Salazar is a 86 y.o. male with medical history significant for sinus bradycardia status post dual-chamber pacemaker (07/13/2017), CAD status post multiple coronary stents/CABG (2007), HTN, HLD, COVID-19 pneumonia (requiring hospitalization at [facility] from 1/26-1/30/2021) who presents on 09/16/2019 with sudden difficulty in speech.  Patient was seen in ED on 5/15 after tripping on a curb and falling on his right forehead, CT head at that time was nonacute and cervical spine CT also showed no acute findings.  Patient has difficulty presenting history due to her finding difficulty for further history obtained from patient supplemented by chart review.  He states that around 9 PM on 5/16 at his baseline but he got up in the middle of the night last night and felt unlike himself and reportedly notices difficulty with words at that time.  His neighbor came to check on him him this morning around 10 and noticed he was having stuttering.  Patient denies any further falls, no new weakness or numbness, no chest pain, no abdominal pain, no fevers or chills.  Patient is adherent with his Plavix for his CAD the. He also takes Toprol lisinopril for his blood pressure   ED Course:  Patient was afebrile, heart rate 59, Blood pressure 137/69, Covid test negative.  Lab work notable for glucose 101, BUN 26, creatinine 1.52, hemoglobin 11.8. CT head showed no acute intracranial abnormalities, atrophy and chronic microvascular ischemic changes.  Patient received 1 L normal saline bolus. TRH was called for further management  Subjective  Today there has quite significant stuttering with multiple episodes of incomprehensible speech and  expressive aphasia.  A & P   New word finding difficulty after a fall concerning for stroke.    Has some intermittent normal speech but still very significant stuttering with expressive aphasia that makes it quite difficult to understand patient.  CT head x2 unremarkable, unable to get MRI due to pacemaker, neurology concern symptoms likely represent small subcortical stroke.  Patient has multiple risk factors .  TTE with no concerning findings, carotid ultrasound shows 1 to 49% stenosis of bilateral right/left internal carotid arteries. -Neurology consulted, appreciate recommendations -A1c pending -Unable to obtain MRI due to pacemaker --Speech eval, PT/OT--SNF versus CIR -Started on aspirin, continue home Plavix   AKI prerenal, likely related to diminished oral hydration.  1.52 on admission, baseline 0.9.  Approaching baseline after 24 hours of IV fluids -Repeat BMP in a.m. -Avoid nephrotoxins   Chronic normocytic anemia, stable.  Hemoglobin stable at baseline 11.  No signs or symptoms of bleeding. -Monitor CBC  CAD, status post multiple stents/CABG (2007).  Currently asymptomatic -Monitor on telemetry -On Plavix -Holding home Toprol given very likely stroke to allow permissive hypertension  HTN.  Allow permissive hypertension given very likely stroke -Holding home lisinopril and metoprolol succinate  Hyperlipidemia, stable.  LDL at goal -Continue high intensity statin  Sinus bradycardia, status post dual-chamber pacemaker (07/13/2017) -Monitor on telemetry  History of esophageal ring, status post balloon dilation with EGD on 10/2018 -Speech recommends dysphagia 3 (mechanical soft diet)    Family Communication  : Called friend Levada Dy at listed number with no answer.  Left voice message  Code Status : Full  Disposition Plan  :  Patient is from home. Anticipated d/c date:  24 to 48 hours barriers to d/c or necessity for inpatient status:  PT/OT evaluation.  Recommends  CIR, undergoing evaluation Consults  : Neurology  Procedures  : TTE on 5/18.  Carotid ultrasound on 5/18  DVT Prophylaxis  : Aspirin, Plavix  Lab Results  Component Value Date   PLT 174 09/17/2019    Diet :  Diet Order            DIET DYS 3 Room service appropriate? Yes; Fluid consistency: Thin  Diet effective now               Inpatient Medications Scheduled Meds: . aspirin EC  81 mg Oral Daily  . atorvastatin  80 mg Oral q1800  . clopidogrel  75 mg Oral Daily  . enoxaparin (LOVENOX) injection  40 mg Subcutaneous Q24H  . fluticasone  2 spray Each Nare Daily  . latanoprost  1 drop Both Eyes QHS   Continuous Infusions: PRN Meds:.acetaminophen **OR** acetaminophen (TYLENOL) oral liquid 160 mg/5 mL **OR** acetaminophen, senna-docusate  Antibiotics  :   Anti-infectives (From admission, onward)   None       Objective   Vitals:   09/16/19 2000 09/16/19 2053 09/17/19 0750 09/17/19 1734  BP: (!) 142/78 (!) 140/56 (!) 152/52 (!) 164/62  Pulse: 62 69 72 84  Resp: 17 17 17 17   Temp:  98.5 F (36.9 C) 98.2 F (36.8 C) 98.5 F (36.9 C)  TempSrc:  Oral Oral Oral  SpO2: 98% 98% 98% 99%  Weight:      Height:        SpO2: 99 %  Wt Readings from Last 3 Encounters:  09/16/19 62.1 kg  09/14/19 62.1 kg  09/05/19 62.1 kg     Intake/Output Summary (Last 24 hours) at 09/17/2019 2200 Last data filed at 09/17/2019 1900 Gross per 24 hour  Intake 234.32 ml  Output 450 ml  Net -215.68 ml    Physical Exam:     Awake Alert, Oriented X 3, Normal affect Very difficult to understand with intermittent moments of clear speech but still quite persistent stuttering and expressive aphasia [place].AT, Normal respiratory effort on room air, CTAB RRR,No Gallops,Rubs or new Murmurs,  +ve B.Sounds, Abd Soft, No tenderness, No rebound, guarding or rigidity. Right forehead abrasion   I have personally reviewed the following:   Data Reviewed:  CBC Recent Labs  Lab  09/16/19 1251 09/17/19 0745  WBC 5.3 7.4  HGB 11.8* 12.4*  HCT 35.3* 38.5*  PLT 171 174  MCV 93.6 95.8  MCH 31.3 30.8  MCHC 33.4 32.2  RDW 13.9 13.6  LYMPHSABS 0.8  --   MONOABS 0.3  --   EOSABS 0.0  --   BASOSABS 0.0  --     Chemistries  Recent Labs  Lab 09/16/19 1251 09/17/19 0745  NA 138 140  K 4.7 4.5  CL 103 107  CO2 24 22  GLUCOSE 101* 74  BUN 26* 24*  CREATININE 1.52* 1.12  CALCIUM 9.2 8.8*   ------------------------------------------------------------------------------------------------------------------ Recent Labs    09/17/19 0538  CHOL 111  HDL 42  LDLCALC 57  TRIG 60  CHOLHDL 2.6    No results found for: HGBA1C ------------------------------------------------------------------------------------------------------------------ No results for input(s): TSH, T4TOTAL, T3FREE, THYROIDAB in the last 72 hours.  Invalid input(s): FREET3 ------------------------------------------------------------------------------------------------------------------ No results for input(s): VITAMINB12, FOLATE, FERRITIN, TIBC, IRON, RETICCTPCT in the last 72 hours.  Coagulation profile Recent Labs  Lab 09/16/19 1251  INR 1.0    No results for input(s): DDIMER in the last 72 hours.  Cardiac Enzymes No results for input(s): CKMB, TROPONINI, MYOGLOBIN in the last 168 hours.  Invalid input(s): CK ------------------------------------------------------------------------------------------------------------------    Component Value Date/Time   BNP 585.6 (H) 05/19/2017 1059    Micro Results Recent Results (from the past 240 hour(s))  SARS Coronavirus 2 by RT PCR (hospital order, performed in [facility] hospital lab) Nasopharyngeal Nasopharyngeal Swab     Status: None   Collection Time: 09/16/19  2:24 PM   Specimen: Nasopharyngeal Swab  Result Value Ref Range Status   SARS Coronavirus 2 NEGATIVE NEGATIVE Final    Comment: (NOTE) SARS-CoV-2 target nucleic acids are  NOT DETECTED. The SARS-CoV-2 RNA is generally detectable in upper and lower respiratory specimens during the acute phase of infection. The lowest concentration of SARS-CoV-2 viral copies this assay can detect is 250 copies / mL. A negative result does not preclude SARS-CoV-2 infection and should not be used as the sole basis for treatment or other patient management decisions.  A negative result may occur with improper specimen collection / handling, submission of specimen other than nasopharyngeal swab, presence of viral mutation(s) within the areas targeted by this assay, and inadequate number of viral copies (<250 copies / mL). A negative result must be combined with clinical observations, patient history, and epidemiological information. Fact Sheet for Patients:   StrictlyIdeas.no Fact Sheet for Healthcare Providers: BankingDealers.co.za This test is not yet approved or cleared  by the [place] FDA and has been authorized for detection and/or diagnosis of SARS-CoV-2 by FDA under an Emergency Use Authorization (EUA).  This EUA will remain in effect (meaning this test can be used) for the duration of the COVID-19 declaration under Section 564(b)(1) of the Act, 21 U.S.C. section 360bbb-3(b)(1), unless the authorization is terminated or revoked sooner. Performed at [facility], [address] [place], [place] [address]     Radiology Reports DG Chest 2 View  Result Date: 09/16/2019 CLINICAL DATA:  Patient status post fall. EXAM: CHEST - 2 VIEW COMPARISON:  Chest radiograph 05/28/2019. FINDINGS: Stable multi lead pacer apparatus overlying the left hemithorax. Stable cardiomegaly. Small bilateral pleural effusions. Underlying pulmonary consolidation. Thoracic spine degenerative changes. Postsurgical changes proximal left humerus. IMPRESSION: Small effusions with underlying consolidation which may represent atelectasis or  infection. Electronically Signed   By: Lovey Newcomer M.D.   On: 09/16/2019 19:13   CT HEAD WO CONTRAST  Result Date: 09/17/2019 CLINICAL DATA:  Episode of altered cognition with expressive aphasia. EXAM: CT HEAD WITHOUT CONTRAST TECHNIQUE: Contiguous axial images were obtained from the base of the skull through the vertex without intravenous contrast. COMPARISON:  CT scan 09/16/2019 FINDINGS: Brain: Stable age related cerebral atrophy, ventriculomegaly and periventricular white matter disease. No extra-axial fluid collections are identified. No CT findings for acute hemispheric infarction or intracranial hemorrhage. No mass lesions. The brainstem and cerebellum are normal. Vascular: Stable vascular calcifications. No aneurysm or hyperdense vessels. Skull: No skull fracture or bone lesions. Sinuses/Orbits: The paranasal sinuses and mastoid air cells are clear. The globes are intact. Other: No scalp lesions or hematoma. IMPRESSION: 1. Stable age related cerebral atrophy, ventriculomegaly and periventricular white matter disease. 2. No acute intracranial findings or mass lesions. Electronically Signed   By: Marijo Sanes M.D.   On: 09/17/2019 06:46   CT Head Wo Contrast  Result Date: 09/16/2019 CLINICAL DATA:  Fall on 09/14/2019. Negative CT at that time. Patient had an episode of  altered cognition with stuttering and expressive aphasia this morning. EXAM: CT HEAD WITHOUT CONTRAST TECHNIQUE: Contiguous axial images were obtained from the base of the skull through the vertex without intravenous contrast. COMPARISON:  09/14/2019 FINDINGS: Brain: No evidence of acute infarction, hemorrhage, hydrocephalus, extra-axial collection or mass lesion/mass effect. There is ventricular and sulcal enlargement reflecting mild diffuse atrophy. Patchy white matter hypoattenuation is also noted consistent with mild chronic microvascular ischemic change. These findings are stable. Vascular: No hyperdense vessel or unexpected  calcification. Skull: Normal. Negative for fracture or focal lesion. Sinuses/Orbits: Globes and orbits are unremarkable. Visualized sinuses and mastoid air cells are clear. Other: None. IMPRESSION: 1. No acute intracranial abnormalities. 2. Atrophy and chronic microvascular ischemic change stable from the prior study. Electronically Signed   By: Lajean Manes M.D.   On: 09/16/2019 13:46   CT Head Wo Contrast  Result Date: 09/14/2019 CLINICAL DATA:  Patient tripped and fell. EXAM: CT HEAD WITHOUT CONTRAST CT CERVICAL SPINE WITHOUT CONTRAST TECHNIQUE: Multidetector CT imaging of the head and cervical spine was performed following the standard protocol without intravenous contrast. Multiplanar CT image reconstructions of the cervical spine were also generated. COMPARISON:  None. FINDINGS: CT HEAD FINDINGS Brain: No evidence of acute infarction, hemorrhage, hydrocephalus, extra-axial collection or mass lesion/mass effect. There is ventricular and sulcal enlargement reflecting mild diffuse atrophy. Patchy areas of hypoattenuation are noted in the white matter consistent with mild chronic microvascular ischemic change. Vascular: No hyperdense vessel or unexpected calcification. Skull: Normal. Negative for fracture or focal lesion. Sinuses/Orbits: No acute finding. Other: None. CT CERVICAL SPINE FINDINGS Alignment: Degenerative grade 1 anterolisthesis of C3 on C4. No other spondylolisthesis. Reversed cervical lordosis, apex at C4-C5. Skull base and vertebrae: No acute fracture. No primary bone lesion or focal pathologic process. Soft tissues and spinal canal: No prevertebral fluid or swelling. No visible canal hematoma. Disc levels: Severe loss of disc height at C3-C4, C4-C5, C5-C6 and C6-C7 with mild endplate spurring and spondylotic disc bulging. There are significant facet degenerative changes at are greatest on the right at C3-C4. No convincing disc herniation. Upper chest: No acute findings. Other: None.  IMPRESSION: HEAD CT 1. No acute intracranial abnormalities. 2. Atrophy and mild chronic microvascular ischemic change. CERVICAL CT 1. No fracture or acute finding. 2. Significant degenerative changes as detailed above. Electronically Signed   By: Lajean Manes M.D.   On: 09/14/2019 13:31   CT Cervical Spine Wo Contrast  Result Date: 09/14/2019 CLINICAL DATA:  Patient tripped and fell. EXAM: CT HEAD WITHOUT CONTRAST CT CERVICAL SPINE WITHOUT CONTRAST TECHNIQUE: Multidetector CT imaging of the head and cervical spine was performed following the standard protocol without intravenous contrast. Multiplanar CT image reconstructions of the cervical spine were also generated. COMPARISON:  None. FINDINGS: CT HEAD FINDINGS Brain: No evidence of acute infarction, hemorrhage, hydrocephalus, extra-axial collection or mass lesion/mass effect. There is ventricular and sulcal enlargement reflecting mild diffuse atrophy. Patchy areas of hypoattenuation are noted in the white matter consistent with mild chronic microvascular ischemic change. Vascular: No hyperdense vessel or unexpected calcification. Skull: Normal. Negative for fracture or focal lesion. Sinuses/Orbits: No acute finding. Other: None. CT CERVICAL SPINE FINDINGS Alignment: Degenerative grade 1 anterolisthesis of C3 on C4. No other spondylolisthesis. Reversed cervical lordosis, apex at C4-C5. Skull base and vertebrae: No acute fracture. No primary bone lesion or focal pathologic process. Soft tissues and spinal canal: No prevertebral fluid or swelling. No visible canal hematoma. Disc levels: Severe loss of disc height at C3-C4, C4-C5, C5-C6  and C6-C7 with mild endplate spurring and spondylotic disc bulging. There are significant facet degenerative changes at are greatest on the right at C3-C4. No convincing disc herniation. Upper chest: No acute findings. Other: None. IMPRESSION: HEAD CT 1. No acute intracranial abnormalities. 2. Atrophy and mild chronic  microvascular ischemic change. CERVICAL CT 1. No fracture or acute finding. 2. Significant degenerative changes as detailed above. Electronically Signed   By: Lajean Manes M.D.   On: 09/14/2019 13:31   US Carotid Bilateral (at [facility] and [facility] only)  Result Date: 09/17/2019 CLINICAL DATA:  86 year old male with symptoms of transient ischemic attack EXAM: BILATERAL CAROTID DUPLEX ULTRASOUND TECHNIQUE: Pearline Cables scale imaging, color Doppler and duplex ultrasound were performed of bilateral carotid and vertebral arteries in the neck. COMPARISON:  None. FINDINGS: Criteria: Quantification of carotid stenosis is based on velocity parameters that correlate the residual internal carotid diameter with NASCET-based stenosis levels, using the diameter of the distal internal carotid lumen as the denominator for stenosis measurement. The following velocity measurements were obtained: RIGHT ICA: 89/11 cm/sec CCA: [id number] cm/sec SYSTOLIC ICA/CCA RATIO:  0.8 ECA:  202 cm/sec LEFT ICA: 109/18 cm/sec CCA: [id number] cm/sec SYSTOLIC ICA/CCA RATIO:  1.3 ECA:  241 cm/sec RIGHT CAROTID ARTERY: Heterogeneous atherosclerotic plaque at the carotid bifurcation extending into the proximal internal and external carotid arteries. By peak systolic velocity criteria, the internal carotid artery stenosis is less than 50%. RIGHT VERTEBRAL ARTERY:  Patent with antegrade flow. LEFT CAROTID ARTERY: Heterogeneous atherosclerotic plaque in the carotid bifurcation extending into the proximal internal and external carotid arteries. By peak systolic velocity criteria, the estimated stenosis remains less than 50%. LEFT VERTEBRAL ARTERY:  Patent with normal antegrade flow. IMPRESSION: 1. Mild (1-49%) stenosis proximal right internal carotid artery secondary to heterogenous atherosclerotic plaque. 2. Mild (1-49%) stenosis proximal left internal carotid artery secondary to heterogenous atherosclerotic plaque. 3. The vertebral arteries are patent with normal antegrade  flow. Signed, Criselda Peaches, MD, [place] [facility] Radiology Electronically Signed   By: Jacqulynn Cadet M.D.   On: 09/17/2019 12:15   DG Knee Complete 4 Views Left  Result Date: 09/14/2019 CLINICAL DATA:  Fall, injury, abrasions EXAM: LEFT KNEE - COMPLETE 4+ VIEW COMPARISON:  02/13/2008 FINDINGS: Mild degenerative changes and chondrocalcinosis. Normal alignment. No acute osseous finding or fracture. Peripheral atherosclerosis present. IMPRESSION: Chondrocalcinosis and mild degenerative changes. No acute osseous finding or effusion. Peripheral atherosclerosis Electronically Signed   By: Jerilynn Mages.  Shick M.D.   On: 09/14/2019 12:54   ECHOCARDIOGRAM COMPLETE  Result Date: 09/17/2019    ECHOCARDIOGRAM REPORT   Patient Name:   Ivan Salazar Date of Exam: 09/17/2019 Medical Rec #:  [id number]      Height:       70.0 in Accession #:    [id number]     Weight:       136.9 lb Date of Birth:  1931/06/22       BSA:          1.777 m Patient Age:    4 years       BP:           152/52 mmHg Patient Gender: M              HR:           65 bpm. Exam Location:  [facility] Procedure: 2D Echo, Color Doppler and Cardiac Doppler Indications:     G45.9 TIA  History:  Patient has no prior history of Echocardiogram examinations.                  SSS, CAD, Pacemaker; Risk Factors:Former Smoker.  Sonographer:     Charmayne Sheer RDCS (AE) Referring Phys:  [id number] [place] Diagnosing Phys: Serafina Royals MD  Sonographer Comments: No subcostal window. IMPRESSIONS  1. Left ventricular ejection fraction, by estimation, is 55 to 60%. The left ventricle has normal function. The left ventricle has no regional wall motion abnormalities. There is mild left ventricular hypertrophy. Left ventricular diastolic parameters were normal.  2. Right ventricular systolic function is normal. The right ventricular size is normal.  3. Left atrial size was mildly dilated.  4. Right atrial size was mildly  dilated.  5. The mitral valve is normal in structure. Moderate mitral valve regurgitation.  6. The aortic valve is normal in structure. Aortic valve regurgitation is trivial. FINDINGS  Left Ventricle: Left ventricular ejection fraction, by estimation, is 55 to 60%. The left ventricle has normal function. The left ventricle has no regional wall motion abnormalities. The left ventricular internal cavity size was normal in size. There is  mild left ventricular hypertrophy. Left ventricular diastolic parameters were normal. Right Ventricle: The right ventricular size is normal. No increase in right ventricular wall thickness. Right ventricular systolic function is normal. Left Atrium: Left atrial size was mildly dilated. Right Atrium: Right atrial size was mildly dilated. Pericardium: There is no evidence of pericardial effusion. Mitral Valve: The mitral valve is normal in structure. Moderate mitral valve regurgitation. MV peak gradient, 9.5 mmHg. The mean mitral valve gradient is 2.0 mmHg. Tricuspid Valve: The tricuspid valve is normal in structure. Tricuspid valve regurgitation is mild. Aortic Valve: The aortic valve is normal in structure. Aortic valve regurgitation is trivial. Aortic valve mean gradient measures 3.0 mmHg. Aortic valve peak gradient measures 6.6 mmHg. Aortic valve area, by VTI measures 2.15 cm. Pulmonic Valve: The pulmonic valve was normal in structure. Pulmonic valve regurgitation is trivial. Aorta: The aortic root and ascending aorta are structurally normal, with no evidence of dilitation. IAS/Shunts: No atrial level shunt detected by color flow Doppler.  LEFT VENTRICLE PLAX 2D LVIDd:         3.80 cm  Diastology LVIDs:         2.65 cm  LV e' lateral:   8.38 cm/s LV PW:         1.41 cm  LV E/e' lateral: 14.2 LV IVS:        1.19 cm  LV e' medial:    4.46 cm/s LVOT diam:     2.10 cm  LV E/e' medial:  26.7 LV SV:         61 LV SV Index:   34 LVOT Area:     3.46 cm  RIGHT VENTRICLE RV Basal diam:   2.98 cm LEFT ATRIUM             Index       RIGHT ATRIUM           Index LA diam:        4.30 cm 2.42 cm/m  RA Area:     16.40 cm LA Vol (A2C):   74.0 ml 41.65 ml/m RA Volume:   42.10 ml  23.69 ml/m LA Vol (A4C):   92.1 ml 51.84 ml/m LA Biplane Vol: 86.9 ml 48.91 ml/m  AORTIC VALVE  PULMONIC VALVE AV Area (Vmax):    2.18 cm    PV Vmax:       0.85 m/s AV Area (Vmean):   2.12 cm    PV Vmean:      55.400 cm/s AV Area (VTI):     2.15 cm    PV VTI:        0.159 m AV Vmax:           128.00 cm/s PV Peak grad:  2.9 mmHg AV Vmean:          88.000 cm/s PV Mean grad:  1.0 mmHg AV VTI:            0.284 m AV Peak Grad:      6.6 mmHg AV Mean Grad:      3.0 mmHg LVOT Vmax:         80.50 cm/s LVOT Vmean:        53.900 cm/s LVOT VTI:          0.176 m LVOT/AV VTI ratio: 0.62  AORTA Ao Root diam: 3.00 cm MITRAL VALVE MV Area (PHT): 2.66 cm     SHUNTS MV Peak grad:  9.5 mmHg     Systemic VTI:  0.18 m MV Mean grad:  2.0 mmHg     Systemic Diam: 2.10 cm MV Vmax:       1.54 m/s MV Vmean:      65.0 cm/s MV Decel Time: 285 msec MV E velocity: 119.00 cm/s MV A velocity: 51.90 cm/s MV E/A ratio:  2.29 Serafina Royals MD Electronically signed by Serafina Royals MD Signature Date/Time: 09/17/2019/4:07:29 PM    Final      Time Spent in minutes  30     Desiree Hane M.D on 09/17/2019 at 10:00 PM  To page go to [email or web address] - password [facility]

## 2019-09-17 NOTE — Progress Notes (Signed)
PT Cancellation Note  Patient Details Name: Ivan Salazar MRN: [id number] DOB: 11-13-1931   Cancelled Treatment:    Reason Eval/Treat Not Completed: Patient at procedure or test/unavailable(Consult received and chart reviewed. Patient currently off unit for diagnostic imaging.  Will re-attempt at later time/date if medically appropriate and available.)   Frannie Shedrick H. Owens Shark, PT, DPT, NCS 09/17/19, 11:46 AM [phone]

## 2019-09-18 ENCOUNTER — Inpatient Hospital Stay: Payer: Medicare Other

## 2019-09-18 ENCOUNTER — Inpatient Hospital Stay
Admission: AD | Admit: 2019-09-18 | Payer: Medicare Other | Source: Other Acute Inpatient Hospital | Admitting: Pulmonary Disease

## 2019-09-18 ENCOUNTER — Encounter: Payer: Self-pay | Admitting: Internal Medicine

## 2019-09-18 DIAGNOSIS — Z87828 Personal history of other (healed) physical injury and trauma: Secondary | ICD-10-CM | POA: Diagnosis not present

## 2019-09-18 DIAGNOSIS — J9690 Respiratory failure, unspecified, unspecified whether with hypoxia or hypercapnia: Secondary | ICD-10-CM | POA: Diagnosis not present

## 2019-09-18 DIAGNOSIS — E785 Hyperlipidemia, unspecified: Secondary | ICD-10-CM | POA: Diagnosis not present

## 2019-09-18 DIAGNOSIS — F0781 Postconcussional syndrome: Secondary | ICD-10-CM | POA: Diagnosis present

## 2019-09-18 DIAGNOSIS — I2510 Atherosclerotic heart disease of native coronary artery without angina pectoris: Secondary | ICD-10-CM | POA: Diagnosis present

## 2019-09-18 DIAGNOSIS — J9601 Acute respiratory failure with hypoxia: Secondary | ICD-10-CM | POA: Diagnosis present

## 2019-09-18 DIAGNOSIS — Z951 Presence of aortocoronary bypass graft: Secondary | ICD-10-CM | POA: Diagnosis not present

## 2019-09-18 DIAGNOSIS — I340 Nonrheumatic mitral (valve) insufficiency: Secondary | ICD-10-CM | POA: Diagnosis not present

## 2019-09-18 DIAGNOSIS — I639 Cerebral infarction, unspecified: Secondary | ICD-10-CM | POA: Diagnosis not present

## 2019-09-18 DIAGNOSIS — I739 Peripheral vascular disease, unspecified: Secondary | ICD-10-CM | POA: Diagnosis not present

## 2019-09-18 DIAGNOSIS — J984 Other disorders of lung: Secondary | ICD-10-CM | POA: Diagnosis not present

## 2019-09-18 DIAGNOSIS — R9401 Abnormal electroencephalogram [EEG]: Secondary | ICD-10-CM | POA: Diagnosis not present

## 2019-09-18 DIAGNOSIS — Z681 Body mass index (BMI) 19 or less, adult: Secondary | ICD-10-CM | POA: Diagnosis not present

## 2019-09-18 DIAGNOSIS — R0989 Other specified symptoms and signs involving the circulatory and respiratory systems: Secondary | ICD-10-CM | POA: Diagnosis not present

## 2019-09-18 DIAGNOSIS — M6281 Muscle weakness (generalized): Secondary | ICD-10-CM | POA: Diagnosis not present

## 2019-09-18 DIAGNOSIS — G40909 Epilepsy, unspecified, not intractable, without status epilepticus: Secondary | ICD-10-CM | POA: Diagnosis not present

## 2019-09-18 DIAGNOSIS — R1310 Dysphagia, unspecified: Secondary | ICD-10-CM | POA: Diagnosis present

## 2019-09-18 DIAGNOSIS — N179 Acute kidney failure, unspecified: Secondary | ICD-10-CM | POA: Diagnosis not present

## 2019-09-18 DIAGNOSIS — R2681 Unsteadiness on feet: Secondary | ICD-10-CM | POA: Diagnosis not present

## 2019-09-18 DIAGNOSIS — J90 Pleural effusion, not elsewhere classified: Secondary | ICD-10-CM | POA: Diagnosis not present

## 2019-09-18 DIAGNOSIS — R109 Unspecified abdominal pain: Secondary | ICD-10-CM | POA: Diagnosis not present

## 2019-09-18 DIAGNOSIS — Z4682 Encounter for fitting and adjustment of non-vascular catheter: Secondary | ICD-10-CM | POA: Diagnosis not present

## 2019-09-18 DIAGNOSIS — I080 Rheumatic disorders of both mitral and aortic valves: Secondary | ICD-10-CM | POA: Diagnosis not present

## 2019-09-18 DIAGNOSIS — N3001 Acute cystitis with hematuria: Secondary | ICD-10-CM | POA: Diagnosis present

## 2019-09-18 DIAGNOSIS — Z950 Presence of cardiac pacemaker: Secondary | ICD-10-CM | POA: Diagnosis not present

## 2019-09-18 DIAGNOSIS — J9602 Acute respiratory failure with hypercapnia: Secondary | ICD-10-CM | POA: Diagnosis not present

## 2019-09-18 DIAGNOSIS — R0789 Other chest pain: Secondary | ICD-10-CM | POA: Diagnosis not present

## 2019-09-18 DIAGNOSIS — R778 Other specified abnormalities of plasma proteins: Secondary | ICD-10-CM | POA: Diagnosis not present

## 2019-09-18 DIAGNOSIS — G40901 Epilepsy, unspecified, not intractable, with status epilepticus: Secondary | ICD-10-CM | POA: Diagnosis not present

## 2019-09-18 DIAGNOSIS — Z9911 Dependence on respirator [ventilator] status: Secondary | ICD-10-CM | POA: Diagnosis not present

## 2019-09-18 DIAGNOSIS — R29818 Other symptoms and signs involving the nervous system: Secondary | ICD-10-CM | POA: Diagnosis not present

## 2019-09-18 DIAGNOSIS — R498 Other voice and resonance disorders: Secondary | ICD-10-CM | POA: Diagnosis not present

## 2019-09-18 DIAGNOSIS — Z20822 Contact with and (suspected) exposure to covid-19: Secondary | ICD-10-CM | POA: Diagnosis present

## 2019-09-18 DIAGNOSIS — Z7902 Long term (current) use of antithrombotics/antiplatelets: Secondary | ICD-10-CM | POA: Diagnosis not present

## 2019-09-18 DIAGNOSIS — R930 Abnormal findings on diagnostic imaging of skull and head, not elsewhere classified: Secondary | ICD-10-CM | POA: Diagnosis not present

## 2019-09-18 DIAGNOSIS — R451 Restlessness and agitation: Secondary | ICD-10-CM | POA: Diagnosis present

## 2019-09-18 DIAGNOSIS — I10 Essential (primary) hypertension: Secondary | ICD-10-CM | POA: Diagnosis present

## 2019-09-18 DIAGNOSIS — D649 Anemia, unspecified: Secondary | ICD-10-CM | POA: Diagnosis present

## 2019-09-18 DIAGNOSIS — Z8616 Personal history of COVID-19: Secondary | ICD-10-CM | POA: Diagnosis not present

## 2019-09-18 DIAGNOSIS — L8990 Pressure ulcer of unspecified site, unspecified stage: Secondary | ICD-10-CM | POA: Insufficient documentation

## 2019-09-18 DIAGNOSIS — R569 Unspecified convulsions: Secondary | ICD-10-CM | POA: Diagnosis not present

## 2019-09-18 DIAGNOSIS — I517 Cardiomegaly: Secondary | ICD-10-CM | POA: Diagnosis not present

## 2019-09-18 DIAGNOSIS — K2101 Gastro-esophageal reflux disease with esophagitis, with bleeding: Secondary | ICD-10-CM | POA: Diagnosis not present

## 2019-09-18 DIAGNOSIS — E569 Vitamin deficiency, unspecified: Secondary | ICD-10-CM | POA: Diagnosis not present

## 2019-09-18 DIAGNOSIS — J9600 Acute respiratory failure, unspecified whether with hypoxia or hypercapnia: Secondary | ICD-10-CM

## 2019-09-18 DIAGNOSIS — R410 Disorientation, unspecified: Secondary | ICD-10-CM | POA: Diagnosis not present

## 2019-09-18 DIAGNOSIS — G9340 Encephalopathy, unspecified: Secondary | ICD-10-CM | POA: Diagnosis present

## 2019-09-18 DIAGNOSIS — R630 Anorexia: Secondary | ICD-10-CM | POA: Diagnosis present

## 2019-09-18 DIAGNOSIS — J9811 Atelectasis: Secondary | ICD-10-CM | POA: Diagnosis not present

## 2019-09-18 DIAGNOSIS — R4701 Aphasia: Secondary | ICD-10-CM | POA: Diagnosis present

## 2019-09-18 DIAGNOSIS — R488 Other symbolic dysfunctions: Secondary | ICD-10-CM | POA: Diagnosis not present

## 2019-09-18 DIAGNOSIS — R561 Post traumatic seizures: Secondary | ICD-10-CM | POA: Diagnosis not present

## 2019-09-18 DIAGNOSIS — D696 Thrombocytopenia, unspecified: Secondary | ICD-10-CM | POA: Diagnosis present

## 2019-09-18 DIAGNOSIS — N390 Urinary tract infection, site not specified: Secondary | ICD-10-CM | POA: Diagnosis not present

## 2019-09-18 DIAGNOSIS — E299 Testicular dysfunction, unspecified: Secondary | ICD-10-CM | POA: Diagnosis not present

## 2019-09-18 DIAGNOSIS — F445 Conversion disorder with seizures or convulsions: Secondary | ICD-10-CM | POA: Diagnosis not present

## 2019-09-18 DIAGNOSIS — I63213 Cerebral infarction due to unspecified occlusion or stenosis of bilateral vertebral arteries: Secondary | ICD-10-CM | POA: Diagnosis not present

## 2019-09-18 DIAGNOSIS — R1312 Dysphagia, oropharyngeal phase: Secondary | ICD-10-CM | POA: Diagnosis not present

## 2019-09-18 DIAGNOSIS — G9341 Metabolic encephalopathy: Secondary | ICD-10-CM | POA: Diagnosis not present

## 2019-09-18 DIAGNOSIS — R531 Weakness: Secondary | ICD-10-CM | POA: Diagnosis not present

## 2019-09-18 DIAGNOSIS — R079 Chest pain, unspecified: Secondary | ICD-10-CM | POA: Diagnosis not present

## 2019-09-18 DIAGNOSIS — J449 Chronic obstructive pulmonary disease, unspecified: Secondary | ICD-10-CM | POA: Diagnosis not present

## 2019-09-18 DIAGNOSIS — R319 Hematuria, unspecified: Secondary | ICD-10-CM | POA: Diagnosis not present

## 2019-09-18 DIAGNOSIS — R918 Other nonspecific abnormal finding of lung field: Secondary | ICD-10-CM | POA: Diagnosis not present

## 2019-09-18 DIAGNOSIS — I6782 Cerebral ischemia: Secondary | ICD-10-CM | POA: Diagnosis not present

## 2019-09-18 DIAGNOSIS — Z79899 Other long term (current) drug therapy: Secondary | ICD-10-CM | POA: Diagnosis not present

## 2019-09-18 DIAGNOSIS — G40411 Other generalized epilepsy and epileptic syndromes, intractable, with status epilepticus: Secondary | ICD-10-CM | POA: Diagnosis present

## 2019-09-18 DIAGNOSIS — G40409 Other generalized epilepsy and epileptic syndromes, not intractable, without status epilepticus: Secondary | ICD-10-CM | POA: Diagnosis not present

## 2019-09-18 DIAGNOSIS — S060X1A Concussion with loss of consciousness of 30 minutes or less, initial encounter: Secondary | ICD-10-CM | POA: Diagnosis not present

## 2019-09-18 DIAGNOSIS — R4182 Altered mental status, unspecified: Secondary | ICD-10-CM | POA: Diagnosis not present

## 2019-09-18 DIAGNOSIS — E43 Unspecified severe protein-calorie malnutrition: Secondary | ICD-10-CM | POA: Diagnosis not present

## 2019-09-18 DIAGNOSIS — R7401 Elevation of levels of liver transaminase levels: Secondary | ICD-10-CM | POA: Diagnosis not present

## 2019-09-18 LAB — BLOOD GAS, ARTERIAL
Acid-base deficit: 3.8 mmol/L — ABNORMAL HIGH (ref 0.0–2.0)
Bicarbonate: 20.7 mmol/L (ref 20.0–28.0)
FIO2: 0.5
MECHVT: 500 mL
O2 Saturation: 99.4 %
PEEP: 5 cmH2O
Patient temperature: 37
RATE: 16 resp/min
pCO2 arterial: 35 mmHg (ref 32.0–48.0)
pH, Arterial: 7.38 (ref 7.350–7.450)
pO2, Arterial: 156 mmHg — ABNORMAL HIGH (ref 83.0–108.0)

## 2019-09-18 LAB — CBC
HCT: 36.3 % — ABNORMAL LOW (ref 39.0–52.0)
Hemoglobin: 12.4 g/dL — ABNORMAL LOW (ref 13.0–17.0)
MCH: 31.9 pg (ref 26.0–34.0)
MCHC: 34.2 g/dL (ref 30.0–36.0)
MCV: 93.3 fL (ref 80.0–100.0)
Platelets: 189 10*3/uL (ref 150–400)
RBC: 3.89 MIL/uL — ABNORMAL LOW (ref 4.22–5.81)
RDW: 13.8 % (ref 11.5–15.5)
WBC: 15 10*3/uL — ABNORMAL HIGH (ref 4.0–10.5)
nRBC: 0 % (ref 0.0–0.2)

## 2019-09-18 LAB — COMPREHENSIVE METABOLIC PANEL
ALT: 22 U/L (ref 0–44)
AST: 27 U/L (ref 15–41)
Albumin: 3.7 g/dL (ref 3.5–5.0)
Alkaline Phosphatase: 55 U/L (ref 38–126)
Anion gap: 10 (ref 5–15)
BUN: 24 mg/dL — ABNORMAL HIGH (ref 8–23)
CO2: 25 mmol/L (ref 22–32)
Calcium: 8.7 mg/dL — ABNORMAL LOW (ref 8.9–10.3)
Chloride: 107 mmol/L (ref 98–111)
Creatinine, Ser: 1.38 mg/dL — ABNORMAL HIGH (ref 0.61–1.24)
GFR calc Af Amer: 53 mL/min — ABNORMAL LOW (ref >60)
GFR calc non Af Amer: 46 mL/min — ABNORMAL LOW (ref >60)
Glucose, Bld: 143 mg/dL — ABNORMAL HIGH (ref 70–99)
Potassium: 4.4 mmol/L (ref 3.5–5.1)
Sodium: 142 mmol/L (ref 135–145)
Total Bilirubin: 2.6 mg/dL — ABNORMAL HIGH (ref 0.3–1.2)
Total Protein: 6.5 g/dL (ref 6.5–8.1)

## 2019-09-18 LAB — HEMOGLOBIN A1C
Hgb A1c MFr Bld: 5.4 % (ref 4.8–5.6)
Mean Plasma Glucose: 108 mg/dL

## 2019-09-18 LAB — TRIGLYCERIDES: Triglycerides: 135 mg/dL (ref <150)

## 2019-09-18 LAB — GLUCOSE, CAPILLARY
Glucose-Capillary: 127 mg/dL — ABNORMAL HIGH (ref 70–99)
Glucose-Capillary: 135 mg/dL — ABNORMAL HIGH (ref 70–99)

## 2019-09-18 LAB — MRSA PCR SCREENING: MRSA by PCR: NEGATIVE

## 2019-09-18 MED ORDER — ORAL CARE MOUTH RINSE
15.0000 mL | OROMUCOSAL | Status: DC
  Administered 2019-09-18 (×2): 15 mL via OROMUCOSAL

## 2019-09-18 MED ORDER — LEVETIRACETAM IN NACL 500 MG/100ML IV SOLN
500.0000 mg | Freq: Two times a day (BID) | INTRAVENOUS | Status: DC
  Filled 2019-09-18 (×2): qty 100

## 2019-09-18 MED ORDER — SODIUM CHLORIDE 0.9 % IV SOLN
100.0000 mg | Freq: Two times a day (BID) | INTRAVENOUS | Status: DC
  Filled 2019-09-18: qty 10

## 2019-09-18 MED ORDER — LEVETIRACETAM IN NACL 500 MG/100ML IV SOLN: 500.0000 mg | Freq: Two times a day (BID) | INTRAVENOUS | Status: DC

## 2019-09-18 MED ORDER — ETOMIDATE 2 MG/ML IV SOLN
20.0000 mg | Freq: Once | INTRAVENOUS | Status: AC
  Administered 2019-09-18: 20 mg via INTRAVENOUS

## 2019-09-18 MED ORDER — CHLORHEXIDINE GLUCONATE CLOTH 2 % EX PADS: 6.0000 | MEDICATED_PAD | Freq: Every day | CUTANEOUS | Status: DC

## 2019-09-18 MED ORDER — FENTANYL CITRATE (PF) 100 MCG/2ML IJ SOLN: 25.0000 ug | INTRAMUSCULAR | 0 refills | Status: DC | PRN

## 2019-09-18 MED ORDER — SODIUM CHLORIDE 0.9 % IV SOLN: 100.0000 mg | Freq: Two times a day (BID) | INTRAVENOUS | Status: DC

## 2019-09-18 MED ORDER — LEVETIRACETAM IN NACL 1000 MG/100ML IV SOLN
1000.0000 mg | Freq: Once | INTRAVENOUS | Status: AC
  Administered 2019-09-18: 1000 mg via INTRAVENOUS
  Filled 2019-09-18: qty 100

## 2019-09-18 MED ORDER — CHLORHEXIDINE GLUCONATE 0.12% ORAL RINSE (MEDLINE KIT): 15.0000 mL | Freq: Two times a day (BID) | OROMUCOSAL | 0 refills | Status: DC

## 2019-09-18 MED ORDER — ENOXAPARIN SODIUM 40 MG/0.4ML ~~LOC~~ SOLN: 40.0000 mg | SUBCUTANEOUS | Status: DC

## 2019-09-18 MED ORDER — CHLORHEXIDINE GLUCONATE 0.12 % MT SOLN
15.00 | OROMUCOSAL | Status: DC
Start: 2019-09-23 — End: 2019-09-18

## 2019-09-18 MED ORDER — POLYETHYLENE GLYCOL 3350 17 G PO PACK: 17.0000 g | PACK | Freq: Every day | ORAL | 0 refills | Status: DC

## 2019-09-18 MED ORDER — IPRATROPIUM-ALBUTEROL 0.5-2.5 (3) MG/3ML IN SOLN: 3.0000 mL | RESPIRATORY_TRACT | Status: DC | PRN

## 2019-09-18 MED ORDER — SENNOSIDES-DOCUSATE SODIUM 8.6-50 MG PO TABS: 1.0000 | ORAL_TABLET | Freq: Every evening | ORAL | Status: DC | PRN

## 2019-09-18 MED ORDER — FENTANYL CITRATE (PF) 100 MCG/2ML IJ SOLN
100.0000 ug | Freq: Once | INTRAMUSCULAR | Status: AC
  Administered 2019-09-18: 100 ug via INTRAVENOUS

## 2019-09-18 MED ORDER — SODIUM CHLORIDE 0.9 % IV SOLN
200.0000 mg | Freq: Once | INTRAVENOUS | Status: AC
  Administered 2019-09-18: 200 mg via INTRAVENOUS
  Filled 2019-09-18: qty 20

## 2019-09-18 MED ORDER — ACETAMINOPHEN 650 MG RE SUPP: 650.0000 mg | RECTAL | 0 refills | Status: DC | PRN

## 2019-09-18 MED ORDER — ASPIRIN 81 MG PO TBEC: 81.0000 mg | DELAYED_RELEASE_TABLET | Freq: Every day | ORAL | Status: DC

## 2019-09-18 MED ORDER — LORAZEPAM 2 MG/ML IJ SOLN: 2.0000 mg | INTRAMUSCULAR | Status: DC | PRN

## 2019-09-18 MED ORDER — CLOPIDOGREL BISULFATE 75 MG PO TABS: 75.0000 mg | ORAL_TABLET | Freq: Every day | ORAL | Status: DC

## 2019-09-18 MED ORDER — LORAZEPAM 2 MG/ML IJ SOLN
INTRAMUSCULAR | Status: AC
  Administered 2019-09-18: 2 mg
  Filled 2019-09-18: qty 1

## 2019-09-18 MED ORDER — PROPOFOL 200 MG/20ML IV EMUL
1.00 | INTRAVENOUS | Status: DC
Start: ? — End: 2019-09-18

## 2019-09-18 MED ORDER — ATORVASTATIN CALCIUM 80 MG PO TABS: 80.0000 mg | ORAL_TABLET | Freq: Every day | ORAL | Status: DC

## 2019-09-18 MED ORDER — LATANOPROST 0.005 % OP SOLN: 1.0000 [drp] | Freq: Every day | OPHTHALMIC | 12 refills | Status: DC

## 2019-09-18 MED ORDER — FENTANYL CITRATE (PF) 100 MCG/2ML IJ SOLN
25.0000 ug | INTRAMUSCULAR | Status: DC | PRN
  Administered 2019-09-18: 50 ug via INTRAVENOUS
  Filled 2019-09-18: qty 2

## 2019-09-18 MED ORDER — IOHEXOL 350 MG/ML SOLN
75.0000 mL | Freq: Once | INTRAVENOUS | Status: AC | PRN
  Administered 2019-09-18: 75 mL via INTRAVENOUS

## 2019-09-18 MED ORDER — POLYETHYLENE GLYCOL 3350 17 G PO PACK: 17.0000 g | PACK | Freq: Every day | ORAL | Status: DC

## 2019-09-18 MED ORDER — LACTATED RINGERS IV SOLN: INTRAVENOUS | Status: DC

## 2019-09-18 MED ORDER — FAMOTIDINE 20 MG PO TABS
20.00 | ORAL_TABLET | ORAL | Status: DC
Start: 2019-09-18 — End: 2019-09-18

## 2019-09-18 MED ORDER — FAMOTIDINE IN NACL 20-0.9 MG/50ML-% IV SOLN: 20.0000 mg | Freq: Every day | INTRAVENOUS | Status: DC

## 2019-09-18 MED ORDER — PROPOFOL 1000 MG/100ML IV EMUL
0.0000 ug/kg/min | INTRAVENOUS | Status: DC
  Administered 2019-09-18: 15 ug/kg/min via INTRAVENOUS
  Administered 2019-09-18: 35 ug/kg/min via INTRAVENOUS
  Filled 2019-09-18: qty 100

## 2019-09-18 MED ORDER — MUPIROCIN 2 % EX OINT
TOPICAL_OINTMENT | CUTANEOUS | Status: DC
Start: 2019-09-18 — End: 2019-09-18

## 2019-09-18 MED ORDER — LORAZEPAM 2 MG/ML IJ SOLN
INTRAMUSCULAR | Status: AC
  Filled 2019-09-18: qty 1

## 2019-09-18 MED ORDER — LACTATED RINGERS IV SOLN: 75.0000 mL | INTRAVENOUS | 0 refills | Status: DC

## 2019-09-18 MED ORDER — FENTANYL CITRATE (PF) 100 MCG/2ML IJ SOLN: 25.0000 ug | INTRAMUSCULAR | Status: DC | PRN

## 2019-09-18 MED ORDER — ISOLYTE-S IV SOLN
75.00 | INTRAVENOUS | Status: DC
Start: ? — End: 2019-09-18

## 2019-09-18 MED ORDER — DOCUSATE SODIUM 50 MG/5ML PO LIQD: 100.0000 mg | Freq: Two times a day (BID) | ORAL | 0 refills | Status: DC

## 2019-09-18 MED ORDER — CHLORHEXIDINE GLUCONATE 0.12% ORAL RINSE (MEDLINE KIT)
15.0000 mL | Freq: Two times a day (BID) | OROMUCOSAL | Status: DC
  Administered 2019-09-18: 15 mL via OROMUCOSAL

## 2019-09-18 MED ORDER — ACETAMINOPHEN 325 MG PO TABS: 650.0000 mg | ORAL_TABLET | ORAL | Status: DC | PRN

## 2019-09-18 MED ORDER — LEVETIRACETAM IN NACL 500 MG/100ML IV SOLN
500.00 | INTRAVENOUS | Status: DC
Start: 2019-09-23 — End: 2019-09-18

## 2019-09-18 MED ORDER — ROCURONIUM BROMIDE 50 MG/5ML IV SOLN
50.0000 mg | Freq: Once | INTRAVENOUS | Status: AC
  Administered 2019-09-18: 50 mg via INTRAVENOUS

## 2019-09-18 MED ORDER — ORAL CARE MOUTH RINSE: 15.0000 mL | OROMUCOSAL | 0 refills | Status: DC

## 2019-09-18 MED ORDER — PROPOFOL 1000 MG/100ML IV EMUL
INTRAVENOUS | Status: AC
  Filled 2019-09-18: qty 100

## 2019-09-18 MED ORDER — PROPOFOL 1000 MG/100ML IV EMUL: 0.0000 ug/kg/min | INTRAVENOUS | Status: DC

## 2019-09-18 MED ORDER — FAMOTIDINE IN NACL 20-0.9 MG/50ML-% IV SOLN
20.0000 mg | Freq: Every day | INTRAVENOUS | Status: DC
  Administered 2019-09-18: 20 mg via INTRAVENOUS
  Filled 2019-09-18: qty 50

## 2019-09-18 MED ORDER — ACETAMINOPHEN 160 MG/5ML PO SOLN: 650.0000 mg | ORAL | 0 refills | Status: DC | PRN

## 2019-09-18 MED ORDER — DOCUSATE SODIUM 50 MG/5ML PO LIQD: 100.0000 mg | Freq: Two times a day (BID) | ORAL | Status: DC

## 2019-09-18 MED ORDER — LORAZEPAM 2 MG/ML IJ SOLN: 2.0000 mg | INTRAMUSCULAR | 0 refills | Status: DC | PRN

## 2019-09-18 NOTE — Progress Notes (Signed)
[facility] at bedside.pt transferred to stretcher without issue. Vss, with exception of temp. Pt febrile (103 oral), treated with suppository acetaminaphen . Pt has prop, LR, kvo running. Pt family notified of pt transfer by night shift rn

## 2019-09-18 NOTE — Progress Notes (Signed)
Received call from [place] that pt has been accepted at [facility] ICU, however bed currently unavailable.  Will make call to [facility] and [facility] to see if bed available sooner.    Darel Hong, AGACNP-BC [facility] Pager: [phone]

## 2019-09-18 NOTE — ED Provider Notes (Signed)
[place]  Department of Emergency Medicine   Code Blue CONSULT NOTE  Chief Complaint: Cardiac arrest/unresponsive   Level V Caveat: Unresponsive  History of present illness: I was contacted by the hospital for a CODE BLUE cardiac arrest in CT scan and presented to the patient's bedside.   Patient admitted for aphasia.  Patient was from the floor and in CT scan for imaging studies when he desaturated to the 30s with questionable seizure.  CODE BLUE was called.  ROS: Unable to obtain, Level V caveat  Scheduled Meds: . LORazepam      . aspirin EC  81 mg Oral Daily  . atorvastatin  80 mg Oral q1800  . clopidogrel  75 mg Oral Daily  . enoxaparin (LOVENOX) injection  40 mg Subcutaneous Q24H  . fluticasone  2 spray Each Nare Daily  . latanoprost  1 drop Both Eyes QHS   Continuous Infusions: PRN Meds:.acetaminophen **OR** acetaminophen (TYLENOL) oral liquid 160 mg/5 mL **OR** acetaminophen, senna-docusate Past Medical History:  Diagnosis Date  . BPH (benign prostatic hyperplasia)   . CAD (coronary artery disease)   . Carpal tunnel syndrome   . DDD (degenerative disc disease), cervical   . Environmental and seasonal allergies   . Fall 09/14/2019   Fell on curb, injured left knee and hit right forehead  . GERD (gastroesophageal reflux disease)   . Glaucoma   . Impotence   . Nocturia   . Presence of permanent cardiac pacemaker   . Weight loss    Past Surgical History:  Procedure Laterality Date  . APPENDECTOMY    . BALLOON DILATION N/A 11/05/2018   Procedure: BALLOON DILATION;  Surgeon: Manya Silvas, MD;  Location: [facility] ENDOSCOPY;  Service: Endoscopy;  Laterality: N/A;  . BILATERAL CARPAL TUNNEL RELEASE     05/12/2011, 06/09/2011  . CARDIAC CATHETERIZATION  2004  . coils in vessel     6 coils  . CORONARY ANGIOPLASTY     x 5  . CORONARY ARTERY BYPASS GRAFT  2007   5; [facility]  . CORONARY STENT PLACEMENT  2005  . ESOPHAGOGASTRODUODENOSCOPY (EGD) WITH  PROPOFOL N/A 10/28/2015   Procedure: ESOPHAGOGASTRODUODENOSCOPY (EGD) WITH PROPOFOL;  Surgeon: Manya Silvas, MD;  Location: [facility] ENDOSCOPY;  Service: Endoscopy;  Laterality: N/A;  . ESOPHAGOGASTRODUODENOSCOPY (EGD) WITH PROPOFOL N/A 11/05/2018   Procedure: ESOPHAGOGASTRODUODENOSCOPY (EGD) WITH PROPOFOL;  Surgeon: Manya Silvas, MD;  Location: [facility] ENDOSCOPY;  Service: Endoscopy;  Laterality: N/A;  . KIDNEY STONE SURGERY    . PACEMAKER INSERTION N/A 07/13/2017   Procedure: INSERTION PACEMAKER;  Surgeon: Isaias Cowman, MD;  Location: [facility] ORS;  Service: Cardiovascular;  Laterality: N/A;  . SHOULDER ARTHROSCOPY W/ ROTATOR CUFF REPAIR Bilateral   . TONSILLECTOMY    . UPPER GI ENDOSCOPY  08/02/2010   Dr. Tedra Coupe, gastritis. H Pylori negative   Social History   Socioeconomic History  . Marital status: Widowed    Spouse name: Not on file  . Number of children: 5  . Years of education: Not on file  . Highest education level: Master's degree (e.g., MA, MS, MEng, MEd, MSW, MBA)  Occupational History  . Occupation: Retired  Tobacco Use  . Smoking status: Former Smoker    Packs/day: 0.50    Years: 20.00    Pack years: 10.00    Quit date: 05/01/1977    Years since quitting: 42.4  . Smokeless tobacco: Never Used  Substance and Sexual Activity  . Alcohol use: Yes  Alcohol/week: 7.0 - 14.0 standard drinks    Types: 7 - 14 Shots of liquor per week    Comment: moderate; 1-2 drinks daily  . Drug use: No  . Sexual activity: Not on file  Other Topics Concern  . Not on file  Social History Narrative  . Not on file   Social Determinants of Health   Financial Resource Strain: Low Risk   . Difficulty of Paying Living Expenses: Not hard at all  Food Insecurity: No Food Insecurity  . Worried About Charity fundraiser in the Last Year: Never true  . Ran Out of Food in the Last Year: Never true  Transportation Needs: No Transportation Needs  . Lack of Transportation (Medical): No  .  Lack of Transportation (Non-Medical): No  Physical Activity: Insufficiently Active  . Days of Exercise per Week: 3 days  . Minutes of Exercise per Session: 30 min  Stress: No Stress Concern Present  . Feeling of Stress : Not at all  Social Connections: Slightly Isolated  . Frequency of Communication with Friends and Family: More than three times a week  . Frequency of Social Gatherings with Friends and Family: Never  . Attends Religious Services: More than 4 times per year  . Active Member of Clubs or Organizations: Yes  . Attends Archivist Meetings: More than 4 times per year  . Marital Status: Widowed  Intimate Partner Violence: Not At Risk  . Fear of Current or Ex-Partner: No  . Emotionally Abused: No  . Physically Abused: No  . Sexually Abused: No   Allergies  Allergen Reactions  . Celebrex [Celecoxib] Other (See Comments)    Foul taste in mouth   . Mirtazapine Other (See Comments)    Foul taste in mouth    Last set of Vital Signs (not current) Vitals:   09/17/19 1734 09/17/19 2353  BP: (!) 164/62 (!) 147/71  Pulse: 84 78  Resp: 17 16  Temp: 98.5 F (36.9 C) 98.7 F (37.1 C)  SpO2: 99% 97%      Physical Exam  Gen: unresponsive Cardiovascular: Palpable pulse Resp: apneic. Breath sounds equal bilaterally with bagging  Abd: nondistended  Neuro: GCS 3, unresponsive to pain  HEENT: No blood in posterior pharynx, gag reflex absent  Neck: No crepitus  Musculoskeletal: No deformity  Skin: warm    CRITICAL CARE Performed by: Paulette Blanch Total critical care time: 10 Critical care time was exclusive of separately billable procedures and treating other patients. Critical care was necessary to treat or prevent imminent or life-threatening deterioration. Critical care was time spent personally by me on the following activities: development of treatment plan with patient and/or surrogate as well as nursing, discussions with consultants, evaluation of  patient's response to treatment, examination of patient, obtaining history from patient or surrogate, ordering and performing treatments and interventions, ordering and review of laboratory studies, ordering and review of radiographic studies, pulse oximetry and re-evaluation of patient's condition.    Assessment and Plan  Patient with pulse upon my arrival.  Making efforts to breathe on his own.  Hospitalist NP Ouma at bedside.  It was determined to bring patient to CCU where he could be intubated in a more controlled setting by the intensivist.   Paulette Blanch, MD 09/18/19 [phone]

## 2019-09-18 NOTE — Discharge Summary (Addendum)
Physician Discharge Summary  Patient ID: Ivan Salazar MRN: [id number] DOB/AGE: [birth year] 84 y.o.  Admit date: 09/16/2019 Discharge date: 09/18/2019   BRIEF PATIENT DESCRIPTION:  86 y.o. Male admitted 09/16/19 to Med-Surg unit for stroke workup in setting of Aphasia after a fall.  Early in the morning 09/18/19 pt with blank stare and aphasia, while in CT pt with witnessed generalized Tonic-Clonic Seizure.  Following seizure, pt unresponsive, hypoxic, with agonal respirations and unable to protect airway, requiring emergent intubation.  Concern for subclinical seizures, requiring transfer for continuous EEG monitoring.  Discharge Diagnoses:  Acute Hypoxic Hypercapnic Respiratory Failure Acute Metabolic Encephalopathy Tonic-Clonic Seizure Expressive Aphasia                                             AKI Chronic Normocytic Anemia                     DISCHARGE PLAN BY DIAGNOSIS     Acute Hypoxic Hypercapnic Respiratory Failure in the setting of Tonic-clonic seizure Intubated for airway protection -Full vent support -Wean FiO2 & PEEP as tolerated to maintain O2 sats >92% -Follow intermittent CXR & ABG as needed -VAP Protocol -Spontaneous breathing trials when respiratory parameters met and mental status permits  Acute Metabolic Encephalopathy secondary to Tonic-Clonic Seizure ? CVA -Neurology following, appreciate input -Maintain RASS 0 to -1 -Propofol to maintain RASS goal -PRN ativan -After discussion with Neurology Dr. Cheral Marker , will Load with IV Keppra, followed by 500 mg BID, and load with 200 mg IV Vimpat followed by 100 mg BID -CT Head negative x2 -TTE with no concerning findings, carotid ultrasound shows 1 to 49% stenosis of bilateral right/left internal carotid arteries -Unable to obtain MRI due to Pacemaker -TeleNeurology evaluated pt post intubation, doesn't feel need to pursue CTA Head/Neck -Obtain EEG -Needs transfer to [facility] for continuous EEG -Continue Aspirin,  Plavix -Speech evaluation, PT/OT once extubated  AKI>>improving -Monitor I&O's / urinary output -Follow BMP -Ensure adequate renal perfusion -Avoid nephrotoxic agents as able -Replace electrolytes as indicated -IV fluids  Chronic Normocytic Anemia -Monitor for S/Sx of bleeding -Trend CBC -SQ Lovenox for VTE Prophylaxis  -Transfuse for Hgb <7                       DISCHARGE SUMMARY   Ivan Salazar is a 86 year old male with a past medical history significant for COVID-19 Pneumonia (required Hospitalization at [facility] from 05/28/19 to1/30/21), coronary artery disease status post coronary stent/CABG (2007), 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  He was recently seen in the ED on 09/14/19 status post fall, of which work-up was negative and he was discharged home.  He reports he went to bed on 5/16 around 9 PM and was at his baseline.  Early the next morning his neighbor came to check on him around 10:00 AM and noticed he was having stuttering and word finding difficulties concerning for expressive aphasia.  He denied weakness, numbness, facial droop, or any additional falls.  Upon presentation to the ED he was afebrile, normotensive with BP 137/69, and heart rate 59.  Work-up in the ED was notable for glucose 101, BUN 26, creatinine 1.52, hemoglobin 11.8.  CT head with no acute intracranial  abnormalities, did show chronic microvascular ischemic changes.  He was admitted to the [place] unit by the Hospitalist for further work-up and treatment of suspected stroke, AKI, and normocytic anemia.  Neurology was consulted.  Early in the morning on 09/18/2019 it was reported by nursing that he became unresponsive with a blank stare.  Rapid response was called and he was taken for emergent CT Head. While in CT he  yelled loudly then had witnessed generalized tonic-clonic seizure activity for approximately 1 minute followed by unresponsiveness, agonal respirations, and inability to protect his airway.  He was transferred to the ICU for emergent intubation.    Post intubation TeleNeurology evaluated the patient, concern is for possible subclinical seizures.  He was loaded with 1000 mg IV Keppra.  Pt needs transfer for continuous EEG monitoring.              SIGNIFICANT DIAGNOSTIC STUDIES 5/17: CXR>>Stable multi lead pacer apparatus overlying the left hemithorax. Stable cardiomegaly. Small bilateral pleural effusions. Underlying pulmonary consolidation. Thoracic spine degenerative changes. Postsurgical changes proximal left humerus. 5/18: CT Head w/o contrast>>1. Stable age related cerebral atrophy, ventriculomegaly and periventricular white matter disease. 2. No acute intracranial findings or mass lesions. 5/18: US Carotid Bilateral>>1. Mild (1-49%) stenosis proximal right internal carotid artery secondary to heterogenous atherosclerotic plaque. 2. Mild (1-49%) stenosis proximal left internal carotid artery secondary to heterogenous atherosclerotic plaque. 3. The vertebral arteries are patent with normal antegrade flow. CT 5/18: 2D Echocardiogram>> 1. Left ventricular ejection fraction, by estimation, is 55 to 60%. The  left ventricle has normal function. The left ventricle has no regional  wall motion abnormalities. There is mild left ventricular hypertrophy.  Left ventricular diastolic parameters  were normal.  2. Right ventricular systolic function is normal. The right ventricular  size is normal.  3. Left atrial size was mildly dilated.  4. Right atrial size was mildly dilated.  5. The mitral valve is normal in structure. Moderate mitral valve  regurgitation.  6. The aortic valve is normal in structure. Aortic valve regurgitation is  trivial.  MICRO DATA  SARS-CoV-2 PCR 5/17>>  Negative  ANTIBIOTICS N/A  CONSULTS Neurology PCCM  TUBES / LINES Endotracheal tube 5/19>>   Discharge Exam:  General: Critically ill-appearing male, laying in bed, unresponsive with agonal respirations Neuro: Unresponsive, does not withdraw to pain HEENT: Atraumatic, normocephalic, neck supple, no JVD Cardiovascular: Regular rate and rhythm, S1-S2, no murmurs, rubs, gallops Lungs: Agonal respirations Abdomen: Soft, nontender, nondistended, no guarding or rebound tenderness, bowel sounds positive x4 Musculoskeletal:  Normal bulk and tone, no deformities, no edema Skin:  Warm and dry.  No obvious rashes, lesions, or ulcerations.  Vitals:   09/18/19 0120 09/18/19 0132 09/18/19 0200 09/18/19 0300  BP:  (!) 104/58 134/66 (!) 116/57  Pulse:  84 82 88  Resp:  16 17 (!) 23  Temp:  (!) 97.3 F (36.3 C)    TempSrc:  Oral    SpO2: 100% 99% 99% 99%  Weight:      Height: '5\' 10"'  (1.778 m)        Discharge Labs  BMET Recent Labs  Lab 09/16/19 1251 09/17/19 0745  NA 138 140  K 4.7 4.5  CL 103 107  CO2 24 22  GLUCOSE 101* 74  BUN 26* 24*  CREATININE 1.52* 1.12  CALCIUM 9.2 8.8*    CBC Recent Labs  Lab 09/16/19 1251 09/17/19 0745  HGB 11.8* 12.4*  HCT 35.3* 38.5*  WBC 5.3 7.4  PLT 171 174  Anti-Coagulation Recent Labs  Lab 09/16/19 1251  INR 1.0          Allergies as of 09/18/2019      Reactions   Celebrex [celecoxib] Other (See Comments)   Foul taste in mouth   Mirtazapine Other (See Comments)   Foul taste in mouth      Medication List    STOP taking these medications   enalapril 5 MG tablet Commonly known as: VASOTEC   fluticasone 50 MCG/ACT nasal spray Commonly known as: FLONASE   guaiFENesin-codeine 100-10 MG/5ML syrup   HYDROcodone-acetaminophen 7.5-325 MG tablet Commonly known as: NORCO   levocetirizine 5 MG tablet Commonly known as: Xyzal   metoprolol succinate 25 MG 24 hr tablet Commonly known as: TOPROL-XL   mupirocin  cream 2 % Commonly known as: Bactroban   testosterone cypionate 200 MG/ML injection Commonly known as: DEPOTESTOSTERONE CYPIONATE   zolpidem 10 MG tablet Commonly known as: AMBIEN     TAKE these medications   acetaminophen 325 MG tablet Commonly known as: TYLENOL Take 2 tablets (650 mg total) by mouth every 4 (four) hours as needed for mild pain (or temp > 37.5 C (99.5 F)).   acetaminophen 160 MG/5ML solution Commonly known as: TYLENOL Place 20.3 mLs (650 mg total) into feeding tube every 4 (four) hours as needed for mild pain (or temp > 37.5 C (99.5 F)).   acetaminophen 650 MG suppository Commonly known as: TYLENOL Place 1 suppository (650 mg total) rectally every 4 (four) hours as needed for mild pain (or temp > 37.5 C (99.5 F)).   aspirin 81 MG EC tablet Take 1 tablet (81 mg total) by mouth daily.   atorvastatin 80 MG tablet Commonly known as: LIPITOR Take 1 tablet (80 mg total) by mouth daily at 6 PM.   chlorhexidine gluconate (MEDLINE KIT) 0.12 % solution Commonly known as: PERIDEX 15 mLs by Mouth Rinse route 2 (two) times daily.   Chlorhexidine Gluconate Cloth 2 % Pads Apply 6 each topically daily.   clopidogrel 75 MG tablet Commonly known as: PLAVIX Take 1 tablet (75 mg total) by mouth daily.   docusate 50 MG/5ML liquid Commonly known as: COLACE Take 10 mLs (100 mg total) by mouth 2 (two) times daily.   enoxaparin 40 MG/0.4ML injection Commonly known as: LOVENOX Inject 0.4 mLs (40 mg total) into the skin daily.   famotidine 20-0.9 MG/50ML-% Commonly known as: PEPCID Inject 50 mLs (20 mg total) into the vein daily. Start taking on: Sep 19, 2019   fentaNYL 100 MCG/2ML injection Commonly known as: SUBLIMAZE Inject 0.5 mLs (25 mcg total) into the vein every 15 (fifteen) minutes as needed (to achieve RASS & CPOT goal.).   fentaNYL 100 MCG/2ML injection Commonly known as: SUBLIMAZE Inject 0.5-2 mLs (25-100 mcg total) into the vein every 30 (thirty) minutes  as needed (to maintain RASS & CPOT goal.).   ipratropium-albuterol 0.5-2.5 (3) MG/3ML Soln Commonly known as: DUONEB Take 3 mLs by nebulization every 4 (four) hours as needed.   lacosamide 100 mg in sodium chloride 0.9 % 25 mL Inject 100 mg into the vein every 12 (twelve) hours.   lactated ringers infusion Inject 75 mLs into the vein continuous.   latanoprost 0.005 % ophthalmic solution Commonly known as: XALATAN Place 1 drop into both eyes at bedtime.   levETIRAcetam 500 MG/100ML Soln Commonly known as: KEPRRA Inject 100 mLs (500 mg total) into the vein every 12 (twelve) hours.   LORazepam 2 MG/ML injection Commonly known as:  ATIVAN Inject 1 mL (2 mg total) into the vein every 4 (four) hours as needed for seizure or sedation.   mouth rinse Liqd solution 15 mLs by Mouth Rinse route every 2 (two) hours.   polyethylene glycol 17 g packet Commonly known as: MIRALAX / GLYCOLAX Take 17 g by mouth daily.   propofol 1000 MG/100ML Emul injection Commonly known as: DIPRIVAN Inject 0-3,105 mcg/min into the vein continuous.   senna-docusate 8.6-50 MG tablet Commonly known as: Senokot-S Take 1 tablet by mouth at bedtime as needed for moderate constipation.         Disposition: ICU  Discharged Condition: Ivan Salazar has met maximum benefit of inpatient care at [facility].   Needs continuous EEG.   Time spent on disposition:  60 Minutes.   Signed: Darel Hong, AGACNP-BC [facility] Pager: [phone]

## 2019-09-18 NOTE — Consult Note (Signed)
[facility] TeleNeurology Consult Services   Date of Service:   09/18/2019 01:00:57  Impression:     .  R56.9 - Seizures  Comments/Sign-Out: new onset witnessed with "loud yell" then generalized tonic/clonic activity for one minute followed by unresponsiveness even to pain for five minutes now requiring intubation. He originally presented with aphasia with last known well 09/15/2019; repeat CT head negative but patient unable to get MRI due to pacemaker.   Current exam likely impacted by recent paralytic/sedative for intubation. This is suggestive of focal seizure with concern for possible ongoing subclinical status epilepticus if patient fails to respond to pain with hold of sedative/wearing off of paralytic.  He is outside window for alteplase and thrombectomy.  Metrics: Last Known Well: 09/15/2019 21:00:00 [facility] Notification Time: 09/18/2019 01:00:57 Stamp Time: 09/18/2019 01:00:57 Time First Login Attempt: 09/18/2019 01:06:00 Symptoms: seizure, aphasia NIHSS Start Assessment Time: 09/18/2019 01:16:00 Patient is not a candidate for Alteplase/Activase. Alteplase Medical Decision: 09/18/2019 01:13:00  CT head showed no acute hemorrhage or acute core infarct.  Clinical Presentation is not Suggestive of Large Vessel Occlusive Disease  Our recommendations are outlined below.  Recommendations:     .  keppra 1gm IV load stat; then 500mg  IV BID starting tomorrow for maintenance     .  advised to stop sedative and assess if patient responds to pain within next 30-39min. If he does respond, subclinical seizure activity would be reasonably excluded-- in this setting may get routine EEG in AM for further workup. If he does not respond, will require transfer for STAT EEG to eval for ongoing subclinical status epilepticus  Routine Consultation with [facility] for Follow up Care  Sign Out:     .  Discussed with Primary Attending     .  Discussed with  Rapid Response Team    ------------------------------------------------------------------------------  History of Present Illness: Patient is a 86 year old Male.  Inpatient stroke alert was called for symptoms of seizure, aphasia  had inpatient alert called for nurse report of "blank stare" then NP note that "he was aphasic but alert" prompting stroke alert. Then in CT he had a witnessed seizure activity described as "yelled extremely loud...drew up in fetal position, began shaking" that lasted 1 minute then with unresponsiveness. He did not respond to sternal rub prior to intubation, after five minutes of no paralytic.   He was admitted on 09/16/2019 with aphasia/dysarthria after a fall with head trauma on 09/14/2019. EMR reflects (including neurology note yesterday) that his speech never fully recovered since admission and bedside nurse confirms this. Last known well prior to admission on 09/16/2019 documented as 09/15/2019 21:00 by ED physician note. Admit note reflects CAD, HTN, HLD and pacemaker.        NIHSS may not be reliable due to: Patient was intubated and paralyzed  Examination: BP(116/64), Pulse(n/a), Blood Glucose(135, creat normal yesterday) 1A: Level of Consciousness - Postures or Unresponsive + 3 1B: Ask Month and Age - Dysarthric/Intubated/ Trauma/Language Barrier + 1 1C: Blink Eyes & Squeeze Hands - Performs 0 Tasks + 2 2: Test Horizontal Extraocular Movements - Normal + 0 3: Test Visual Fields - No Visual Loss + 0 4: Test Facial Palsy (Use Grimace if Obtunded) - Normal symmetry + 0 5A: Test Left Arm Motor Drift - No Movement + 4 5B: Test Right Arm Motor Drift - No Movement + 4 6A: Test Left Leg Motor Drift - No Movement + 4 6B: Test Right Leg Motor Drift - No Movement +  4 7: Test Limb Ataxia (FNF/Heel-Shin) - Does Not Understand + 0 8: Test Sensation - Coma/Unresponsive + 2 9: Test Language/Aphasia - Normal; No aphasia + 0 10: Test Dysarthria - Normal + 0 11: Test  Extinction/Inattention - No abnormality + 0  NIHSS Score: 24    no response to pain with sternal rub/comp of nailbed BUE, gaze midposition/conjugate but no dolls reflex likely due to paralytic  Pre-Morbid Modified Ranking Scale: Unable to assess   Patient/Family was informed the Neurology Consult would occur via TeleHealth consult by way of interactive audio and video telecommunications and consented to receiving care in this manner.   Patient is being evaluated for possible acute neurologic impairment and high probability of imminent or life-threatening deterioration. I spent total of 24 minutes providing care to this patient, including time for face to face visit via telemedicine, review of medical records, imaging studies and discussion of findings with providers, the patient and/or family.   Dr Lillia Carmel   [facility] [phone]  Case [id number]

## 2019-09-18 NOTE — Progress Notes (Signed)
Called and spoke with Dr. March Rummage at [facility], who has graciously accepted the pt to their Neuro ICU.  Pt assigned to bed 167, and [facility] will arrange for transport.   Darel Hong, AGACNP-BC [place] [facility] Pager: [phone]

## 2019-09-18 NOTE — Progress Notes (Signed)
Rapid Response Event Note  Overview: Called for RR room 103. Pt with admission dx of CVA now has new altered mental status.       Initial Focused Assessment: Pt in bed, does not respond to sternal rub. VS WNL, Pupils R 2 L 3 sluggish.    Interventions:Ivan Salazar to bedside, tele neuro consulted, code stroke called. Pt to CT with bedside RN.    Plan of Care (if not transferred):Pt transferred to ICU 9  Event Summary: Pt had seizure like activity on CT table, code blue called, no loss of pulses. To ICU, intubated.    at    RR called 0018, RRT arrival 0020.     at  [place] ended 0038.        Ivan Salazar A

## 2019-09-18 NOTE — Progress Notes (Signed)
Pt had 2 episodes of tonic clonic seizure activity. Propofol started and ativan given.

## 2019-09-18 NOTE — Progress Notes (Signed)
Went to check in on patient at [facility] d/t telemetry rhythm looking abnormal. Pt was unresponsive with a gaze to the left.  Called rapid at [facility]. Pt assessed with neuro tele while E. Ouma NP at bedside placing orders. Pt taken to CT during CT pt had seizure and placed on side o2 administered and suction. Pt taken to ICU bed 9 and intubated. Call to son to update condition and transfer. He states he will update the neighbor.

## 2019-09-18 NOTE — Consult Note (Addendum)
Name: Ivan Salazar MRN: [id number] DOB: 27-Jul-1931    ADMISSION DATE:  09/16/2019 CONSULTATION DATE:  09/18/2019  REFERRING MD :  Rufina Falco, NP  CHIEF COMPLAINT:  Unresponsive s/p Tonic-Clonic Seizure  BRIEF PATIENT DESCRIPTION:  86 y.o. Male admitted 09/16/19 to Med-Surg unit for stroke workup in setting of Aphasia after a fall.  Early in the morning 09/18/19 pt with blank stare and aphasia, while in CT pt with witness generalized Tonic-Clonic Seizure.  Following seizure, pt unresponsive, hypoxic, with agonal respirations and unable to protect airway, requiring emergent intubation.  Concern for subclinical seizures, requiring transfer for continuous EEG monitoring.  SIGNIFICANT EVENTS  5/15: Fall @ home, presented to ED for evaluation, workup negative, pt discharged home from ED 5/17: Presented again to ED, admitted to [facility] unit for stroke workup; Neurology consulted 5/19: Pt unresponsive with blank stare (change from previous); sent for repeat Head CT  5/19: Witnessed Tonic-Clonic Seizure while in CT; required emergent intubation; PCCM consulted 5/19: Another witnessed Tonic-Clonic Seizure post intubation   STUDIES:  5/17: CXR>>Stable multi lead pacer apparatus overlying the left hemithorax. Stable cardiomegaly. Small bilateral pleural effusions. Underlying pulmonary consolidation. Thoracic spine degenerative changes. Postsurgical changes proximal left humerus. 5/18: CT Head w/o contrast>>1. Stable age related cerebral atrophy, ventriculomegaly and periventricular white matter disease. 2. No acute intracranial findings or mass lesions. 5/18: US Carotid Bilateral>>1. Mild (1-49%) stenosis proximal right internal carotid artery secondary to heterogenous atherosclerotic plaque. 2. Mild (1-49%) stenosis proximal left internal carotid artery secondary to heterogenous atherosclerotic plaque. 3. The vertebral arteries are patent with normal antegrade flow. CT 5/18: 2D  Echocardiogram>> 1. Left ventricular ejection fraction, by estimation, is 55 to 60%. The  left ventricle has normal function. The left ventricle has no regional  wall motion abnormalities. There is mild left ventricular hypertrophy.  Left ventricular diastolic parameters  were normal.  2. Right ventricular systolic function is normal. The right ventricular  size is normal.  3. Left atrial size was mildly dilated.  4. Right atrial size was mildly dilated.  5. The mitral valve is normal in structure. Moderate mitral valve  regurgitation.  6. The aortic valve is normal in structure. Aortic valve regurgitation is  trivial.   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  He was recently seen in the ED on 09/14/19 status post fall, of which work-up was negative and he was discharged home.  He reports he went to bed on 5/16 around 9 PM and was at his baseline.  Early the next morning his neighbor came to check on him around 10:00 AM and noticed he was having stuttering and word finding difficulties concerning for expressive aphasia.  He denied weakness, numbness, facial droop, or any additional falls.  Upon presentation to the ED he was afebrile, normotensive with BP 137/69, and heart rate 59.  Work-up in the ED was notable for glucose 101, BUN 26, creatinine 1.52, hemoglobin 11.8.  CT head with no acute intracranial abnormalities, did show chronic microvascular ischemic changes.  He was admitted to the MedSurg  unit by the Hospitalist for further work-up and treatment  of suspected stroke, AKI, and normocytic anemia.  Neurology was consulted.  Early in the morning on 09/18/2019 it was reported by nursing that he became unresponsive with a blank stare.  Rapid response was called and he was taken for emergent CT Head. While in CT he yelled loudly then had witnessed generalized tonic-clonic seizure activity for approximately 1 minute followed by unresponsiveness, agonal respirations, and inability to protect his airway.  He was transferred to the ICU for emergent intubation.    Post intubation TeleNeurology evaluated the patient, concern is for possible subclinical seizures.  He was loaded with 1000 mg IV Keppra.  Pt needs transfer for continuous EEG monitoring.  PAST MEDICAL HISTORY :   has a past medical history of BPH (benign prostatic hyperplasia), CAD (coronary artery disease), Carpal tunnel syndrome, DDD (degenerative disc disease), cervical, Environmental and seasonal allergies, Fall (09/14/2019), GERD (gastroesophageal reflux disease), Glaucoma, Impotence, Nocturia, Presence of permanent cardiac pacemaker, and Weight loss.  has a past surgical history that includes Cardiac catheterization (2004); Coronary stent placement (2005); Bilateral carpal tunnel release; Upper gi endoscopy (08/02/2010); Coronary artery bypass graft (2007); Tonsillectomy; Appendectomy; Esophagogastroduodenoscopy (egd) with propofol (N/A, 10/28/2015); Coronary angioplasty; Kidney stone surgery; coils in vessel; Shoulder arthroscopy w/ rotator cuff repair (Bilateral); Pacemaker insertion (N/A, 07/13/2017); Esophagogastroduodenoscopy (egd) with propofol (N/A, 11/05/2018); and Balloon dilation (N/A, 11/05/2018). Prior to Admission medications   Medication Sig Start Date End Date Taking? Authorizing Provider  atorvastatin (LIPITOR) 80 MG tablet TAKE ONE TABLET BY MOUTH DAILY Patient taking differently: Take 80 mg by mouth daily at 6 PM.  01/02/19   Yes Fisher, Kirstie Peri, MD  clopidogrel (PLAVIX) 75 MG tablet TAKE ONE TABLET BY MOUTH DAILY Patient taking differently: Take 75 mg by mouth daily.  08/03/19  Yes Birdie Sons, MD  enalapril (VASOTEC) 5 MG tablet TAKE ONE TABLET BY MOUTH DAILY Patient taking differently: Take 5 mg by mouth daily.  08/10/19  Yes Birdie Sons, MD  fluticasone (FLONASE) 50 MCG/ACT nasal spray SPRAY TWO SPRAYS IN EACH NOSTRIL ONCE DAILY Patient taking differently: Place 2 sprays into both nostrils daily.  04/14/19  Yes Birdie Sons, MD  guaiFENesin-codeine 100-10 MG/5ML syrup TAKE ONE TO TWO TEASPOONSFUL (5-10 MILLILITERS) BY MOUTH EVERY SIX HOURS AS NEEDED FOR COUGH Patient taking differently: Take 5-10 mLs by mouth every 6 (six) hours as needed for cough.  07/16/19  Yes Birdie Sons, MD  HYDROcodone-acetaminophen [facility]) 7.5-325 MG tablet 1 tablet every 4-6 hours as needed 09/09/19  Yes Fisher, Kirstie Peri, MD  latanoprost (XALATAN) 0.005 % ophthalmic solution Place 1 drop into both eyes at bedtime.    Yes [provider]  metoprolol succinate (TOPROL-XL) 25 MG 24 hr tablet TAKE ONE TABLET BY MOUTH DAILY Patient taking differently: Take 25 mg by mouth daily.  10/28/18  Yes Birdie Sons, MD  mupirocin cream Drue Stager) 2 % Apply to affected area 3 times daily 09/14/19  Yes Cuthriell, Charline Bills, PA-C  testosterone cypionate (DEPOTESTOSTERONE CYPIONATE) 200 MG/ML injection Inject 1 mL (200 mg total) into the muscle every 28 (twenty-eight) days. 07/04/19  Yes McGowan, Larene Beach A, PA-C  zolpidem (AMBIEN) 10 MG tablet Take 0.5-1 tablets (5-10 mg total) by mouth at bedtime. TAKE 1/2 TO 1 TABLET EVERY NIGHT AT BEDTIME Patient taking differently: Take 5-10 mg by mouth at bedtime.  07/05/19  Yes Birdie Sons, MD  levocetirizine (XYZAL) 5 MG tablet Take 1 tablet (5 mg total) by mouth every evening. Patient not taking: Reported on  09/16/2019 03/06/19   Birdie Sons, MD   Allergies  Allergen Reactions  .  Celebrex [Celecoxib] Other (See Comments)    Foul taste in mouth   . Mirtazapine Other (See Comments)    Foul taste in mouth    FAMILY HISTORY:  family history includes Cancer in his daughter; Cirrhosis in his father. SOCIAL HISTORY:  reports that he quit smoking about 42 years ago. He has a 10.00 pack-year smoking history. He has never used smokeless tobacco. He reports current alcohol use of about 7.0 - 14.0 standard drinks of alcohol per week. He reports that he does not use drugs.   COVID-19 DISASTER DECLARATION:  FULL CONTACT PHYSICAL EXAMINATION WAS NOT POSSIBLE DUE TO TREATMENT OF COVID-19 AND  CONSERVATION OF PERSONAL PROTECTIVE EQUIPMENT, LIMITED EXAM FINDINGS INCLUDE-  Patient assessed or the symptoms described in the history of present illness.  In the context of the Global COVID-19 pandemic, which necessitated consideration that the patient might be at risk for infection with the SARS-CoV-2 virus that causes COVID-19, Institutional protocols and algorithms that pertain to the evaluation of patients at risk for COVID-19 are in a state of rapid change based on information released by regulatory bodies including the CDC and federal and state organizations. These policies and algorithms were followed during the patient's care while in hospital.  REVIEW OF SYSTEMS:   Unable to assess due to unresponsiveness and critical illness  SUBJECTIVE:  Unable to assess due to unresponsiveness and critical illness  VITAL SIGNS: Temp:  [97.3 F (36.3 C)-98.7 F (37.1 C)] 97.3 F (36.3 C) (05/19 0132) Pulse Rate:  [72-84] 84 (05/19 0132) Resp:  [16-17] 16 (05/19 0132) BP: (104-164)/(52-71) 104/58 (05/19 0132) SpO2:  [97 %-99 %] 99 % (05/19 0132)  PHYSICAL EXAMINATION: General: Critically ill-appearing male, laying in bed, unresponsive with agonal respirations Neuro: Unresponsive, does not withdraw to pain HEENT: Atraumatic, normocephalic, neck supple, no JVD Cardiovascular:  Regular rate and rhythm, S1-S2, no murmurs, rubs, gallops Lungs: Agonal respirations Abdomen: Soft, nontender, nondistended, no guarding or rebound tenderness, bowel sounds positive x4 Musculoskeletal:  Normal bulk and tone, no deformities, no edema Skin:  Warm and dry.  No obvious rashes, lesions, or ulcerations.  Recent Labs  Lab 09/16/19 1251 09/17/19 0745  NA 138 140  K 4.7 4.5  CL 103 107  CO2 24 22  BUN 26* 24*  CREATININE 1.52* 1.12  GLUCOSE 101* 74   Recent Labs  Lab 09/16/19 1251 09/17/19 0745  HGB 11.8* 12.4*  HCT 35.3* 38.5*  WBC 5.3 7.4  PLT 171 174   DG Chest 2 View  Result Date: 09/16/2019 CLINICAL DATA:  Patient status post fall. EXAM: CHEST - 2 VIEW COMPARISON:  Chest radiograph 05/28/2019. FINDINGS: Stable multi lead pacer apparatus overlying the left hemithorax. Stable cardiomegaly. Small bilateral pleural effusions. Underlying pulmonary consolidation. Thoracic spine degenerative changes. Postsurgical changes proximal left humerus. IMPRESSION: Small effusions with underlying consolidation which may represent atelectasis or infection. Electronically Signed   By: Lovey Newcomer M.D.   On: 09/16/2019 19:13   CT HEAD WO CONTRAST  Result Date: 09/17/2019 CLINICAL DATA:  Episode of altered cognition with expressive aphasia. EXAM: CT HEAD WITHOUT CONTRAST TECHNIQUE: Contiguous axial images were obtained from the base of the skull through the vertex without intravenous contrast. COMPARISON:  CT scan 09/16/2019 FINDINGS: Brain: Stable age related cerebral atrophy, ventriculomegaly and periventricular white matter disease. No extra-axial fluid collections are identified. No CT findings for acute hemispheric infarction or intracranial hemorrhage. No  mass lesions. The brainstem and cerebellum are normal. Vascular: Stable vascular calcifications. No aneurysm or hyperdense vessels. Skull: No skull fracture or bone lesions. Sinuses/Orbits: The paranasal sinuses and mastoid air cells  are clear. The globes are intact. Other: No scalp lesions or hematoma. IMPRESSION: 1. Stable age related cerebral atrophy, ventriculomegaly and periventricular white matter disease. 2. No acute intracranial findings or mass lesions. Electronically Signed   By: Marijo Sanes M.D.   On: 09/17/2019 06:46   CT Head Wo Contrast  Result Date: 09/16/2019 CLINICAL DATA:  Fall on 09/14/2019. Negative CT at that time. Patient had an episode of altered cognition with stuttering and expressive aphasia this morning. EXAM: CT HEAD WITHOUT CONTRAST TECHNIQUE: Contiguous axial images were obtained from the base of the skull through the vertex without intravenous contrast. COMPARISON:  09/14/2019 FINDINGS: Brain: No evidence of acute infarction, hemorrhage, hydrocephalus, extra-axial collection or mass lesion/mass effect. There is ventricular and sulcal enlargement reflecting mild diffuse atrophy. Patchy white matter hypoattenuation is also noted consistent with mild chronic microvascular ischemic change. These findings are stable. Vascular: No hyperdense vessel or unexpected calcification. Skull: Normal. Negative for fracture or focal lesion. Sinuses/Orbits: Globes and orbits are unremarkable. Visualized sinuses and mastoid air cells are clear. Other: None. IMPRESSION: 1. No acute intracranial abnormalities. 2. Atrophy and chronic microvascular ischemic change stable from the prior study. Electronically Signed   By: Lajean Manes M.D.   On: 09/16/2019 13:46   US Carotid Bilateral (at [facility] and [facility] only)  Result Date: 09/17/2019 CLINICAL DATA:  86 year old male with symptoms of transient ischemic attack EXAM: BILATERAL CAROTID DUPLEX ULTRASOUND TECHNIQUE: Pearline Cables scale imaging, color Doppler and duplex ultrasound were performed of bilateral carotid and vertebral arteries in the neck. COMPARISON:  None. FINDINGS: Criteria: Quantification of carotid stenosis is based on velocity parameters that correlate the residual internal  carotid diameter with NASCET-based stenosis levels, using the diameter of the distal internal carotid lumen as the denominator for stenosis measurement. The following velocity measurements were obtained: RIGHT ICA: 89/11 cm/sec CCA: 916/38 cm/sec SYSTOLIC ICA/CCA RATIO:  0.8 ECA:  202 cm/sec LEFT ICA: 109/18 cm/sec CCA: 46/6 cm/sec SYSTOLIC ICA/CCA RATIO:  1.3 ECA:  241 cm/sec RIGHT CAROTID ARTERY: Heterogeneous atherosclerotic plaque at the carotid bifurcation extending into the proximal internal and external carotid arteries. By peak systolic velocity criteria, the internal carotid artery stenosis is less than 50%. RIGHT VERTEBRAL ARTERY:  Patent with antegrade flow. LEFT CAROTID ARTERY: Heterogeneous atherosclerotic plaque in the carotid bifurcation extending into the proximal internal and external carotid arteries. By peak systolic velocity criteria, the estimated stenosis remains less than 50%. LEFT VERTEBRAL ARTERY:  Patent with normal antegrade flow. IMPRESSION: 1. Mild (1-49%) stenosis proximal right internal carotid artery secondary to heterogenous atherosclerotic plaque. 2. Mild (1-49%) stenosis proximal left internal carotid artery secondary to heterogenous atherosclerotic plaque. 3. The vertebral arteries are patent with normal antegrade flow. Signed, Criselda Peaches, MD, [facility] Electronically Signed   By: Jacqulynn Cadet M.D.   On: 09/17/2019 12:15   ECHOCARDIOGRAM COMPLETE  Result Date: 09/17/2019    ECHOCARDIOGRAM REPORT   Patient Name:   ALFRED HARREL Gusler Date of Exam: 09/17/2019 Medical Rec #:  [id number]      Height:       70.0 in Accession #:    [id number]     Weight:       136.9 lb Date of Birth:  [birth year]       BSA:  1.777 m Patient Age:    52 years       BP:           152/52 mmHg Patient Gender: M              HR:           65 bpm. Exam Location:  [facility] Procedure: 2D Echo, Color Doppler and Cardiac Doppler  Indications:     G45.9 TIA  History:         Patient has no prior history of Echocardiogram examinations.                  SSS, CAD, Pacemaker; Risk Factors:Former Smoker.  Sonographer:     Charmayne Sheer RDCS (AE) Referring Phys:  [id number] [place] Diagnosing Phys: Serafina Royals MD  Sonographer Comments: No subcostal window. IMPRESSIONS  1. Left ventricular ejection fraction, by estimation, is 55 to 60%. The left ventricle has normal function. The left ventricle has no regional wall motion abnormalities. There is mild left ventricular hypertrophy. Left ventricular diastolic parameters were normal.  2. Right ventricular systolic function is normal. The right ventricular size is normal.  3. Left atrial size was mildly dilated.  4. Right atrial size was mildly dilated.  5. The mitral valve is normal in structure. Moderate mitral valve regurgitation.  6. The aortic valve is normal in structure. Aortic valve regurgitation is trivial. FINDINGS  Left Ventricle: Left ventricular ejection fraction, by estimation, is 55 to 60%. The left ventricle has normal function. The left ventricle has no regional wall motion abnormalities. The left ventricular internal cavity size was normal in size. There is  mild left ventricular hypertrophy. Left ventricular diastolic parameters were normal. Right Ventricle: The right ventricular size is normal. No increase in right ventricular wall thickness. Right ventricular systolic function is normal. Left Atrium: Left atrial size was mildly dilated. Right Atrium: Right atrial size was mildly dilated. Pericardium: There is no evidence of pericardial effusion. Mitral Valve: The mitral valve is normal in structure. Moderate mitral valve regurgitation. MV peak gradient, 9.5 mmHg. The mean mitral valve gradient is 2.0 mmHg. Tricuspid Valve: The tricuspid valve is normal in structure. Tricuspid valve regurgitation is mild. Aortic Valve: The aortic valve is normal in structure. Aortic valve  regurgitation is trivial. Aortic valve mean gradient measures 3.0 mmHg. Aortic valve peak gradient measures 6.6 mmHg. Aortic valve area, by VTI measures 2.15 cm. Pulmonic Valve: The pulmonic valve was normal in structure. Pulmonic valve regurgitation is trivial. Aorta: The aortic root and ascending aorta are structurally normal, with no evidence of dilitation. IAS/Shunts: No atrial level shunt detected by color flow Doppler.  LEFT VENTRICLE PLAX 2D LVIDd:         3.80 cm  Diastology LVIDs:         2.65 cm  LV e' lateral:   8.38 cm/s LV PW:         1.41 cm  LV E/e' lateral: 14.2 LV IVS:        1.19 cm  LV e' medial:    4.46 cm/s LVOT diam:     2.10 cm  LV E/e' medial:  26.7 LV SV:         61 LV SV Index:   34 LVOT Area:     3.46 cm  RIGHT VENTRICLE RV Basal diam:  2.98 cm LEFT ATRIUM             Index       RIGHT  ATRIUM           Index LA diam:        4.30 cm 2.42 cm/m  RA Area:     16.40 cm LA Vol (A2C):   74.0 ml 41.65 ml/m RA Volume:   42.10 ml  23.69 ml/m LA Vol (A4C):   92.1 ml 51.84 ml/m LA Biplane Vol: 86.9 ml 48.91 ml/m  AORTIC VALVE                   PULMONIC VALVE AV Area (Vmax):    2.18 cm    PV Vmax:       0.85 m/s AV Area (Vmean):   2.12 cm    PV Vmean:      55.400 cm/s AV Area (VTI):     2.15 cm    PV VTI:        0.159 m AV Vmax:           128.00 cm/s PV Peak grad:  2.9 mmHg AV Vmean:          88.000 cm/s PV Mean grad:  1.0 mmHg AV VTI:            0.284 m AV Peak Grad:      6.6 mmHg AV Mean Grad:      3.0 mmHg LVOT Vmax:         80.50 cm/s LVOT Vmean:        53.900 cm/s LVOT VTI:          0.176 m LVOT/AV VTI ratio: 0.62  AORTA Ao Root diam: 3.00 cm MITRAL VALVE MV Area (PHT): 2.66 cm     SHUNTS MV Peak grad:  9.5 mmHg     Systemic VTI:  0.18 m MV Mean grad:  2.0 mmHg     Systemic Diam: 2.10 cm MV Vmax:       1.54 m/s MV Vmean:      65.0 cm/s MV Decel Time: 285 msec MV E velocity: 119.00 cm/s MV A velocity: 51.90 cm/s MV E/A ratio:  2.29 Serafina Royals MD Electronically signed by Serafina Royals MD Signature Date/Time: 09/17/2019/4:07:29 PM    Final    CT HEAD CODE STROKE WO CONTRAST`  Result Date: 09/18/2019 CLINICAL DATA:  Code stroke. Initial evaluation for neuro deficit, stroke suspected. EXAM: CT HEAD WITHOUT CONTRAST TECHNIQUE: Contiguous axial images were obtained from the base of the skull through the vertex without intravenous contrast. COMPARISON:  Prior head CT from 09/17/2019. FINDINGS: Brain: Atrophy with chronic small vessel ischemic disease again noted. No acute intracranial hemorrhage. No acute large vessel territory infarct. No mass lesion, midline shift or mass effect. No hydrocephalus or extra-axial fluid collection. Vascular: No hyperdense vessel. Scattered vascular calcifications noted within the carotid siphons. Skull: Scalp soft tissues demonstrate no acute finding. Calvarium intact. Sinuses/Orbits: Globes and orbital soft tissues within normal limits. Paranasal sinuses remain largely clear. No mastoid effusion. Other: None. ASPECTS [facility] Stroke Program Early CT Score) - Ganglionic level infarction (caudate, lentiform nuclei, internal capsule, insula, M1-M3 cortex): 7 - Supraganglionic infarction (M4-M6 cortex): 3 Total score (0-10 with 10 being normal): 10 IMPRESSION: 1. No acute intracranial infarct or other abnormality. 2. ASPECTS is 10. 3. Age-related cerebral atrophy with chronic small vessel ischemic disease, stable. Critical Value/emergent results were called by telephone at the time of interpretation on 09/18/2019 at 1:03 am to provider Rufina Falco , who verbally acknowledged these results. Electronically Signed   By: Pincus Badder.D.  On: 09/18/2019 01:04    ASSESSMENT / PLAN:  Acute Hypoxic Hypercapnic Respiratory Failure in the setting of Tonic-clonic seizure Intubated for airway protection -Full vent support -Wean FiO2 & PEEP as tolerated to maintain O2 sats >92% -Follow intermittent CXR & ABG as needed -VAP Protocol -Spontaneous  breathing trials when respiratory parameters met and mental status permits  Acute Metabolic Encephalopathy following Tonic-Clonic Seizure ? CVA -Neurology following, appreciate input -Maintain RASS 0 to -1 -Propofol to maintain RASS goal -PRN ativan -After discussion with Neurology Dr. Cheral Marker, will Load with IV Keppra, followed by 500 mg BID, and load with 200 mg Vimpat followed by 100 mg BID -CT Head negative x2 -TTE with no concerning findings, carotid ultrasound shows 1 to 49% stenosis of bilateral right/left internal carotid arteries -Unable to obtain MRI due to Pacemaker -TeleNeurology evaluated pt post intubation, doesn't feel need to pursue CTA Head/Neck -Obtain EEG -Needs transfer for continuous EEG -Continue Aspirin, Plavix -Speech evaluation, PT/OT once extubated  AKI>>improving -Monitor I&O's / urinary output -Follow BMP -Ensure adequate renal perfusion -Avoid nephrotoxic agents as able -Replace electrolytes as indicated -IV fluids  Chronic Normocytic Anemia -Monitor for S/Sx of bleeding -Trend CBC -SQ Lovenox for VTE Prophylaxis  -Transfuse for Hgb <7         BEST PRACTICES: DISPOSITION: ICU GOALS OF CARE: Full Code VTE PROPHYLAXIS: Lovenox SQ STRESS ULCER PROPHYLAXIS: IV Pepcid CONSULTS: Neurology, PCCM UPDATES: Updated pt's son via telephone 09/18/19 post intubation   Darel Hong, AGACNP-BC [place] Pager: [phone]  09/18/2019, 1:33 AM

## 2019-09-18 NOTE — Progress Notes (Signed)
PT Cancellation Note  Patient Details Name: Ivan Salazar MRN: [id number] DOB: 05-04-1931   Cancelled Treatment:    Reason Eval/Treat Not Completed: Medical issues which prohibited therapy(Per chart review, patient with change/decline in status overnight; now pending transfer to outside facility for continuous EEG monitoring.  Will complete order at this time; please re-consult should patient remain in house and when medically appropriate.)   Nautika Cressey H. Owens Shark, PT, DPT, NCS 09/18/19, 7:39 AM [phone]

## 2019-09-18 NOTE — Progress Notes (Signed)
Discussed pt's case with Dr. Gwinda Maine at [facility] who has graciously accepted the pt, and reports that beds are available.  Will initiate transfer process to [facility].   Darel Hong, AGACNP-BC [place] [facility] Pager: [phone]

## 2019-09-18 NOTE — Progress Notes (Signed)
Have reached out to both [facility] and [facility], and they both report they are at capacity and have no available Neuro ICU beds.   Darel Hong, AGACNP-BC [place] [facility] Pager: [phone]

## 2019-09-18 NOTE — Procedures (Signed)
Endotracheal Intubation: Intubation Procedure Note QUANTAE MARTEL [id number] Feb 02, 1932  Procedure: Intubation Indications: Respiratory insufficiency Patient required placement of an artificial airway secondary to unresponsive state   Hand washing performed prior to starting the procedure.    Medications administered for sedation prior to procedure:  Fentanyl 100 mcg IV, 20 mg Etomidate IV, Rocuronium 50 mg IV     A time out procedure was called and correct patient, name, & ID confirmed. Needed supplies and equipment were assembled and checked to include ETT, 10 ml syringe, Glidescope, Mac and Miller blades, suction, oxygen and bag mask valve, end tidal CO2 monitor.    Patient was positioned to align the mouth and pharynx to facilitate visualization of the glottis.    Heart rate, SpO2 and blood pressure was continuously monitored during the procedure. Pre-oxygenation was conducted prior to intubation and endotracheal tube was placed through the vocal cords into the trachea.       The artificial airway was placed under direct visualization via glidescope route using a 7.5 cm ETT on the first attempt.   ETT was secured at 23 cm.   Placement was confirmed by auscuitation of lungs with good breath sounds bilaterally and no stomach sounds.  Condensation was noted on endotracheal tube.   Pulse ox 99%.  CO2 detector in place with appropriate color change.   Procedure Details Consent: Unable to obtain consent because of emergent medical necessity. Time Out: Verified patient identification, verified procedure, site/side was marked, verified correct patient position, special equipment/implants available, medications/allergies/relevent history reviewed, required imaging and test results available.  Performed  Maximum sterile technique was used including gloves, hand hygiene and mask.  4   Evaluation Hemodynamic Status: BP stable throughout; O2 sats: stable throughout Patient's Current  Condition: stable Complications: No apparent complications Patient did tolerate procedure well. Chest X-ray ordered to verify placement.  CXR: pending.          Darel Hong, AGACNP-BC [place] [facility] Pager: [phone]  Bradly Bienenstock 09/18/2019

## 2019-09-18 NOTE — Progress Notes (Signed)
Received callback from [facility] transfer center, and unfortunately they have had several trauma cases in ED, and no longer have an open Neuro ICU bed.  Dr. Elizabeth Sauer at [facility] reached out to [facility] who also states they have no Neuro ICU beds.  Will reach out to [facility] for possible transfer.    Darel Hong, AGACNP-BC [place] [facility] Pager: [phone]

## 2019-09-20 MED ORDER — DEXMEDETOMIDINE HCL IN NACL 400 MCG/100ML IV SOLN
0.10 | INTRAVENOUS | Status: DC
Start: ? — End: 2019-09-20

## 2019-09-20 MED ORDER — K PHOS MONO-SOD PHOS DI & MONO 155-852-130 MG PO TABS
500.00 | ORAL_TABLET | ORAL | Status: DC
Start: 2019-09-20 — End: 2019-09-20

## 2019-09-20 MED ORDER — GENERIC EXTERNAL MEDICATION
Status: DC
Start: ? — End: 2019-09-20

## 2019-09-20 MED ORDER — CALCIUM CARBONATE ANTACID 1250 MG/5ML PO SUSP
1250.00 | ORAL | Status: DC
Start: 2019-09-20 — End: 2019-09-20

## 2019-09-20 MED ORDER — FENTANYL CITRATE (PF) 2500 MCG/50ML IJ SOLN
50.00 | INTRAMUSCULAR | Status: DC
Start: ? — End: 2019-09-20

## 2019-09-20 MED ORDER — SODIUM CHLORIDE 0.9 % IV SOLN
10.00 | INTRAVENOUS | Status: DC
Start: ? — End: 2019-09-20

## 2019-09-20 MED ORDER — PROPOFOL 200 MG/20ML IV EMUL
1.00 | INTRAVENOUS | Status: DC
Start: ? — End: 2019-09-20

## 2019-09-20 MED ORDER — HEPARIN SODIUM (PORCINE) 5000 UNIT/ML IJ SOLN
5000.00 | INTRAMUSCULAR | Status: DC
Start: 2019-10-05 — End: 2019-09-20

## 2019-09-20 MED ORDER — ONDANSETRON HCL 4 MG/2ML IJ SOLN
4.00 | INTRAMUSCULAR | Status: DC
Start: ? — End: 2019-09-20

## 2019-09-20 MED ORDER — GENERIC EXTERNAL MEDICATION
1.00 | Status: DC
Start: 2019-09-23 — End: 2019-09-20

## 2019-09-20 MED ORDER — FAMOTIDINE 20 MG PO TABS
20.00 | ORAL_TABLET | ORAL | Status: DC
Start: 2019-09-21 — End: 2019-09-20

## 2019-09-20 MED ORDER — ACETAMINOPHEN 325 MG PO TABS
650.00 | ORAL_TABLET | ORAL | Status: DC
Start: ? — End: 2019-09-20

## 2019-09-20 MED ORDER — ATORVASTATIN CALCIUM 40 MG PO TABS
40.00 | ORAL_TABLET | ORAL | Status: DC
Start: 2019-09-20 — End: 2019-09-20

## 2019-09-20 MED ORDER — MIDAZOLAM HCL 2 MG/2ML IJ SOLN
4.00 | INTRAMUSCULAR | Status: DC
Start: ? — End: 2019-09-20

## 2019-09-20 MED ORDER — CLOPIDOGREL BISULFATE 75 MG PO TABS
75.00 | ORAL_TABLET | ORAL | Status: DC
Start: 2019-09-21 — End: 2019-09-20

## 2019-09-23 MED ORDER — GENERIC EXTERNAL MEDICATION
Status: DC
Start: ? — End: 2019-09-23

## 2019-09-23 MED ORDER — CLOPIDOGREL BISULFATE 75 MG PO TABS
75.00 | ORAL_TABLET | ORAL | Status: DC
Start: 2019-10-05 — End: 2019-09-23

## 2019-09-23 MED ORDER — METOPROLOL TARTRATE 25 MG PO TABS
12.50 | ORAL_TABLET | ORAL | Status: DC
Start: 2019-09-23 — End: 2019-09-23

## 2019-09-23 MED ORDER — MELATONIN 1 MG PO TABS
2.00 | ORAL_TABLET | ORAL | Status: DC
Start: 2019-10-05 — End: 2019-09-23

## 2019-09-23 MED ORDER — LISINOPRIL 10 MG PO TABS
10.00 | ORAL_TABLET | ORAL | Status: DC
Start: 2019-09-23 — End: 2019-09-23

## 2019-09-23 MED ORDER — ZOLPIDEM TARTRATE 5 MG PO TABS
5.00 | ORAL_TABLET | ORAL | Status: DC
Start: ? — End: 2019-09-23

## 2019-09-23 MED ORDER — FAMOTIDINE 20 MG PO TABS
20.00 | ORAL_TABLET | ORAL | Status: DC
Start: 2019-10-05 — End: 2019-09-23

## 2019-09-23 MED ORDER — MAGNESIUM OXIDE 400 MG PO TABS
800.00 | ORAL_TABLET | ORAL | Status: DC
Start: 2019-09-23 — End: 2019-09-23

## 2019-09-23 MED ORDER — K PHOS MONO-SOD PHOS DI & MONO 155-852-130 MG PO TABS
500.00 | ORAL_TABLET | ORAL | Status: DC
Start: 2019-09-23 — End: 2019-09-23

## 2019-09-23 MED ORDER — IPRATROPIUM-ALBUTEROL 0.5-2.5 (3) MG/3ML IN SOLN
3.00 | RESPIRATORY_TRACT | Status: DC
Start: ? — End: 2019-09-23

## 2019-09-23 MED ORDER — ATORVASTATIN CALCIUM 40 MG PO TABS
40.00 | ORAL_TABLET | ORAL | Status: DC
Start: 2019-10-05 — End: 2019-09-23

## 2019-10-01 MED ORDER — CALCIUM CARBONATE-VITAMIN D 600-400 MG-UNIT PO TABS
1.00 | ORAL_TABLET | ORAL | Status: DC
Start: 2019-10-05 — End: 2019-10-01

## 2019-10-01 MED ORDER — GENERIC EXTERNAL MEDICATION
Status: DC
Start: ? — End: 2019-10-01

## 2019-10-01 MED ORDER — LISINOPRIL 20 MG PO TABS
20.00 | ORAL_TABLET | ORAL | Status: DC
Start: 2019-10-04 — End: 2019-10-01

## 2019-10-01 MED ORDER — CHOLECALCIFEROL 25 MCG (1000 UT) PO TABS
1000.00 | ORAL_TABLET | ORAL | Status: DC
Start: 2019-10-05 — End: 2019-10-01

## 2019-10-01 MED ORDER — ZOLPIDEM TARTRATE 5 MG PO TABS
5.00 | ORAL_TABLET | ORAL | Status: DC
Start: 2019-10-05 — End: 2019-10-01

## 2019-10-01 MED ORDER — PHENYTOIN SODIUM 50 MG/ML IJ SOLN
100.00 | INTRAMUSCULAR | Status: DC
Start: 2019-10-04 — End: 2019-10-01

## 2019-10-01 MED ORDER — HYDROCODONE-ACETAMINOPHEN 7.5-325 MG PO TABS
1.00 | ORAL_TABLET | ORAL | Status: DC
Start: ? — End: 2019-10-01

## 2019-10-01 MED ORDER — METOPROLOL TARTRATE 25 MG PO TABS
25.00 | ORAL_TABLET | ORAL | Status: DC
Start: 2019-10-05 — End: 2019-10-01

## 2019-10-01 MED ORDER — MICONAZOLE NITRATE 2 % EX POWD
CUTANEOUS | Status: DC
Start: 2019-10-04 — End: 2019-10-01

## 2019-10-01 MED ORDER — MEGESTROL ACETATE 40 MG/ML PO SUSP
800.00 | ORAL | Status: DC
Start: 2019-10-05 — End: 2019-10-01

## 2019-10-01 MED ORDER — DEXTROMETHORPHAN-GUAIFENESIN 10-100 MG/5ML PO LIQD
5.00 | ORAL | Status: DC
Start: ? — End: 2019-10-01

## 2019-10-03 MED ORDER — SODIUM CHLORIDE 0.9 % IV SOLN
10.00 | INTRAVENOUS | Status: DC
Start: ? — End: 2019-10-03

## 2019-10-03 MED ORDER — GENERIC EXTERNAL MEDICATION
Status: DC
Start: ? — End: 2019-10-03

## 2019-10-03 MED ORDER — ACETAMINOPHEN 325 MG PO TABS
650.00 | ORAL_TABLET | ORAL | Status: DC
Start: ? — End: 2019-10-03

## 2019-10-04 ENCOUNTER — Ambulatory Visit: Payer: Medicare Other | Admitting: Family Medicine

## 2019-10-04 MED ORDER — MAGNESIUM OXIDE 400 MG PO TABS
400.00 | ORAL_TABLET | ORAL | Status: DC
Start: 2019-10-05 — End: 2019-10-04

## 2019-10-04 MED ORDER — GENERIC EXTERNAL MEDICATION
Status: DC
Start: ? — End: 2019-10-04

## 2019-10-07 ENCOUNTER — Encounter: Payer: Self-pay | Admitting: Family Medicine

## 2019-10-07 DIAGNOSIS — R778 Other specified abnormalities of plasma proteins: Secondary | ICD-10-CM | POA: Insufficient documentation

## 2019-10-07 DIAGNOSIS — G40909 Epilepsy, unspecified, not intractable, without status epilepticus: Secondary | ICD-10-CM | POA: Insufficient documentation

## 2019-10-07 MED ORDER — PHENYTOIN SODIUM EXTENDED 100 MG PO CAPS
100.00 | ORAL_CAPSULE | ORAL | Status: DC
Start: 2019-10-05 — End: 2019-10-07

## 2019-10-07 MED ORDER — GENERIC EXTERNAL MEDICATION
Status: DC
Start: ? — End: 2019-10-07

## 2019-10-07 MED ORDER — AMLODIPINE BESYLATE 5 MG PO TABS
5.00 | ORAL_TABLET | ORAL | Status: DC
Start: 2019-10-06 — End: 2019-10-07

## 2019-10-07 MED ORDER — FAMOTIDINE 20 MG PO TABS
20.00 | ORAL_TABLET | ORAL | Status: DC
Start: 2019-10-05 — End: 2019-10-07

## 2019-10-08 ENCOUNTER — Telehealth: Payer: Self-pay

## 2019-10-08 DIAGNOSIS — M6281 Muscle weakness (generalized): Secondary | ICD-10-CM | POA: Diagnosis not present

## 2019-10-08 DIAGNOSIS — R569 Unspecified convulsions: Secondary | ICD-10-CM | POA: Diagnosis not present

## 2019-10-08 DIAGNOSIS — I10 Essential (primary) hypertension: Secondary | ICD-10-CM | POA: Diagnosis not present

## 2019-10-08 DIAGNOSIS — I2510 Atherosclerotic heart disease of native coronary artery without angina pectoris: Secondary | ICD-10-CM | POA: Diagnosis not present

## 2019-10-08 DIAGNOSIS — F0781 Postconcussional syndrome: Secondary | ICD-10-CM | POA: Diagnosis not present

## 2019-10-08 DIAGNOSIS — E785 Hyperlipidemia, unspecified: Secondary | ICD-10-CM | POA: Diagnosis not present

## 2019-10-08 NOTE — Telephone Encounter (Signed)
He will be under the care of the staff physician at [facility] until he has completely rehab. Then he needs to follow up here within a week of his discharge from [facility].

## 2019-10-08 NOTE — Telephone Encounter (Signed)
Copied from [place] [id number]. Topic: General - Other >> Oct 08, 2019 11:33 AM Alanda Slim E wrote: Reason for CRM:Pts daughter called and stated that Pt is out of [facility] and is in [place] doing rehab from a fall at  [facility] in [place] is in room 806/ Pts daughter would like Dr. Caryn Section to call her or the son Nicki Reaper to go over the next step for the Pt / please advise

## 2019-10-08 NOTE — Telephone Encounter (Signed)
Melissa advised as below.

## 2019-10-09 ENCOUNTER — Telehealth: Payer: Self-pay

## 2019-10-09 NOTE — Telephone Encounter (Signed)
NOTES ON FILE FROM  [place] [phone] SENT REFERRAL TO [place]

## 2019-10-09 NOTE — Telephone Encounter (Signed)
NOTES ON [place] IP STROKE AND NEUROSCLENCES [phone] SENT REFERRAL TO [place]

## 2019-10-09 NOTE — Telephone Encounter (Signed)
NOTES ON [place] [phone], SENT REFERRAL TO [place]

## 2019-10-14 ENCOUNTER — Emergency Department
Admission: EM | Admit: 2019-10-14 | Discharge: 2019-10-14 | Disposition: A | Discharge status: Home / Self Care | Payer: Medicare Other | Attending: Emergency Medicine | Admitting: Emergency Medicine

## 2019-10-14 ENCOUNTER — Other Ambulatory Visit: Payer: Self-pay

## 2019-10-14 ENCOUNTER — Ambulatory Visit: Payer: Self-pay

## 2019-10-14 ENCOUNTER — Encounter: Payer: Self-pay | Admitting: Emergency Medicine

## 2019-10-14 ENCOUNTER — Emergency Department: Payer: Medicare Other

## 2019-10-14 DIAGNOSIS — R2689 Other abnormalities of gait and mobility: Secondary | ICD-10-CM | POA: Insufficient documentation

## 2019-10-14 DIAGNOSIS — Z7401 Bed confinement status: Secondary | ICD-10-CM | POA: Diagnosis not present

## 2019-10-14 DIAGNOSIS — S0990XA Unspecified injury of head, initial encounter: Secondary | ICD-10-CM | POA: Diagnosis present

## 2019-10-14 DIAGNOSIS — I10 Essential (primary) hypertension: Secondary | ICD-10-CM | POA: Diagnosis not present

## 2019-10-14 DIAGNOSIS — I959 Hypotension, unspecified: Secondary | ICD-10-CM | POA: Diagnosis not present

## 2019-10-14 DIAGNOSIS — Y939 Activity, unspecified: Secondary | ICD-10-CM | POA: Diagnosis not present

## 2019-10-14 DIAGNOSIS — R402411 Glasgow coma scale score 13-15, in the field [EMT or ambulance]: Secondary | ICD-10-CM | POA: Diagnosis not present

## 2019-10-14 DIAGNOSIS — Y999 Unspecified external cause status: Secondary | ICD-10-CM | POA: Diagnosis not present

## 2019-10-14 DIAGNOSIS — Z7901 Long term (current) use of anticoagulants: Secondary | ICD-10-CM | POA: Diagnosis not present

## 2019-10-14 DIAGNOSIS — S0083XA Contusion of other part of head, initial encounter: Secondary | ICD-10-CM | POA: Insufficient documentation

## 2019-10-14 DIAGNOSIS — I2510 Atherosclerotic heart disease of native coronary artery without angina pectoris: Secondary | ICD-10-CM | POA: Diagnosis not present

## 2019-10-14 DIAGNOSIS — E860 Dehydration: Secondary | ICD-10-CM | POA: Diagnosis not present

## 2019-10-14 DIAGNOSIS — Z79899 Other long term (current) drug therapy: Secondary | ICD-10-CM | POA: Insufficient documentation

## 2019-10-14 DIAGNOSIS — T148XXA Other injury of unspecified body region, initial encounter: Secondary | ICD-10-CM | POA: Insufficient documentation

## 2019-10-14 DIAGNOSIS — Z950 Presence of cardiac pacemaker: Secondary | ICD-10-CM | POA: Diagnosis not present

## 2019-10-14 DIAGNOSIS — W050XXA Fall from non-moving wheelchair, initial encounter: Secondary | ICD-10-CM | POA: Insufficient documentation

## 2019-10-14 DIAGNOSIS — Y92129 Unspecified place in nursing home as the place of occurrence of the external cause: Secondary | ICD-10-CM | POA: Diagnosis not present

## 2019-10-14 DIAGNOSIS — M2550 Pain in unspecified joint: Secondary | ICD-10-CM | POA: Diagnosis not present

## 2019-10-14 DIAGNOSIS — R404 Transient alteration of awareness: Secondary | ICD-10-CM | POA: Diagnosis not present

## 2019-10-14 LAB — CBC
HCT: 35 % — ABNORMAL LOW (ref 39.0–52.0)
Hemoglobin: 11.7 g/dL — ABNORMAL LOW (ref 13.0–17.0)
MCH: 31 pg (ref 26.0–34.0)
MCHC: 33.4 g/dL (ref 30.0–36.0)
MCV: 92.8 fL (ref 80.0–100.0)
Platelets: 253 10*3/uL (ref 150–400)
RBC: 3.77 MIL/uL — ABNORMAL LOW (ref 4.22–5.81)
RDW: 14.8 % (ref 11.5–15.5)
WBC: 12.3 10*3/uL — ABNORMAL HIGH (ref 4.0–10.5)
nRBC: 0 % (ref 0.0–0.2)

## 2019-10-14 LAB — BASIC METABOLIC PANEL
Anion gap: 10 (ref 5–15)
BUN: 43 mg/dL — ABNORMAL HIGH (ref 8–23)
CO2: 25 mmol/L (ref 22–32)
Calcium: 9.2 mg/dL (ref 8.9–10.3)
Chloride: 108 mmol/L (ref 98–111)
Creatinine, Ser: 1.96 mg/dL — ABNORMAL HIGH (ref 0.61–1.24)
GFR calc Af Amer: 35 mL/min — ABNORMAL LOW (ref >60)
GFR calc non Af Amer: 30 mL/min — ABNORMAL LOW (ref >60)
Glucose, Bld: 133 mg/dL — ABNORMAL HIGH (ref 70–99)
Potassium: 4.7 mmol/L (ref 3.5–5.1)
Sodium: 143 mmol/L (ref 135–145)

## 2019-10-14 MED ORDER — SODIUM CHLORIDE 0.9 % IV BOLUS
500.0000 mL | Freq: Once | INTRAVENOUS | Status: AC
  Administered 2019-10-14: 500 mL via INTRAVENOUS

## 2019-10-14 NOTE — ED Provider Notes (Signed)
[facility] Emergency Department Provider Note   ____________________________________________   First MD Initiated Contact with Patient 10/14/19 1733     (approximate)  I have reviewed the triage vital signs and the nursing notes.   HISTORY  Chief Complaint Fall  EM caveat: Patient history of confusion, confusion today  HPI HARPER SMOKER is a 86 y.o. male currently residing at [facility].  Referred after witnessed to fall out of his wheelchair at nurses station today.  Struck his head with bleeding over the right side of the scalp and a skin tear to the right elbow.  Daughter reports that in May he was intubated and was managed for severe concussion at [place].  They thought at 1 point he was also potentially having seizures.  He has been [place] at [facility], has difficulty with alteration of mental status and confusion since his fall.  Daughter reports he is acting normally to that level now.  Patient denies pain or discomfort at this time other than feeling slightly achy across the back of his scalp upper neck  Daughter reports no recent illness, not aware of any new concerns at [facility] other than he does have severe balance difficulty they have basically been teaching him how to stay seated upright, working to rehab him further.  Has a history of suspected frequent falls at home prior to his hospitalization and rehab     Past Medical History:  Diagnosis Date  . BPH (benign prostatic hyperplasia)   . CAD (coronary artery disease)   . Carpal tunnel syndrome   . DDD (degenerative disc disease), cervical   . Environmental and seasonal allergies   . Fall 09/14/2019   Fell on curb, injured left knee and hit right forehead  . GERD (gastroesophageal reflux disease)   . Glaucoma   . Impotence   . Nocturia   . Presence of permanent cardiac pacemaker   . Weight loss     Patient Active Problem List   Diagnosis Date Noted  . Seizure disorder  ([place]) 10/07/2019  . Troponin level elevated 10/07/2019  . Pressure injury of skin 09/18/2019  . Aphasia 09/17/2019  . Word finding difficulty 09/16/2019  . Expressive aphasia 09/16/2019  . AKI (acute kidney injury) ([place]) 09/16/2019  . Pneumonia due to COVID-19 virus 05/28/2019  . Stiffness of shoulder joint 03/14/2019  . Status post cardiac pacemaker procedure 07/13/2017  . Mitral insufficiency 06/17/2017  . OA (osteoarthritis) of neck 11/01/2016  . Eustachian tube dysfunction 09/03/2015  . Schatzki's ring 09/03/2015  . Adenomatous polyp of colon 09/03/2015  . Hypogonadism in male 03/09/2015  . BPH with obstruction/lower urinary tract symptoms 03/09/2015  . Loss of weight 03/05/2015  . Actinic keratoses 10/22/2014  . DDD (degenerative disc disease), lumbosacral 10/22/2014  . Acid reflux 10/22/2014  . Testicular hypofunction 06/27/2012  . Bursitis of hip 01/18/2011  . Family history of colonic polyps 12/24/2007  . CAD in native artery 08/16/2006  . H/O coronary artery bypass surgery 08/16/2006  . Anemia, iron deficiency 09/23/2005  . Cardiac enlargement 07/20/2005  . HLD (hyperlipidemia) 05/02/1998  . Insomnia, persistent 05/02/1998  . Essential (primary) hypertension 05/02/1998    Past Surgical History:  Procedure Laterality Date  . APPENDECTOMY    . BALLOON DILATION N/A 11/05/2018   Procedure: BALLOON DILATION;  Surgeon: Manya Silvas, MD;  Location[facility] ENDOSCOPY;  Service: Endoscopy;  Laterality: N/A;  . BILATERAL CARPAL TUNNEL RELEASE     05/12/2011, 06/09/2011  . CARDIAC CATHETERIZATION  2004  . coils in vessel     6 coils  . CORONARY ANGIOPLASTY     x 5  . CORONARY ARTERY BYPASS GRAFT  2007   5; [facility]  . CORONARY STENT PLACEMENT  2005  . ESOPHAGOGASTRODUODENOSCOPY (EGD) WITH PROPOFOL N/A 10/28/2015   Procedure: ESOPHAGOGASTRODUODENOSCOPY (EGD) WITH PROPOFOL;  Surgeon: Manya Silvas, MD;  Location: [facility] ENDOSCOPY;  Service: Endoscopy;  Laterality: N/A;    . ESOPHAGOGASTRODUODENOSCOPY (EGD) WITH PROPOFOL N/A 11/05/2018   Procedure: ESOPHAGOGASTRODUODENOSCOPY (EGD) WITH PROPOFOL;  Surgeon: Manya Silvas, MD;  Location: [facility] ENDOSCOPY;  Service: Endoscopy;  Laterality: N/A;  . KIDNEY STONE SURGERY    . PACEMAKER INSERTION N/A 07/13/2017   Procedure: INSERTION PACEMAKER;  Surgeon: Isaias Cowman, MD;  Location: [facility] ORS;  Service: Cardiovascular;  Laterality: N/A;  . SHOULDER ARTHROSCOPY W/ ROTATOR CUFF REPAIR Bilateral   . TONSILLECTOMY    . UPPER GI ENDOSCOPY  08/02/2010   Dr. Tedra Coupe, gastritis. H Pylori negative    Prior to Admission medications   Medication Sig Start Date End Date Taking? Authorizing Provider  amLODipine (NORVASC) 5 MG tablet Take 5 mg by mouth daily.   Yes [provider]  atorvastatin (LIPITOR) 80 MG tablet Take 1 tablet (80 mg total) by mouth daily at 6 PM. Patient taking differently: Take 40 mg by mouth at bedtime.  09/18/19  Yes Darel Hong D, NP  Calcium Carbonate-Vit D-Min (CALCIUM 600+D PLUS MINERALS) 600-400 MG-UNIT TABS Take 1 tablet by mouth daily.   Yes [provider]  cholecalciferol (VITAMIN D3) 25 MCG (1000 UNIT) tablet Take 1,000 Units by mouth daily.   Yes [provider]  clopidogrel (PLAVIX) 75 MG tablet Take 1 tablet (75 mg total) by mouth daily. 09/18/19  Yes Darel Hong D, NP  Dextromethorphan-guaiFENesin (GERI-TUSSIN DM) 10-100 MG/5ML liquid Take 10 mLs by mouth every 6 (six) hours as needed (cough).   Yes [provider]  famotidine (PEPCID) 20 MG tablet Take 20 mg by mouth 2 (two) times daily.   Yes [provider]  Ipratropium-Albuterol (COMBIVENT RESPIMAT) 20-100 MCG/ACT AERS respimat Inhale 1 puff into the lungs every 6 (six) hours as needed for wheezing.   Yes [provider]  magnesium oxide (MAG-OX) 400 MG tablet Take 400 mg by mouth daily.   Yes [provider]  megestrol (MEGACE) 400 MG/10ML suspension Take 400 mg by  mouth daily.   Yes [provider]  melatonin 1 MG TABS tablet Take 2 mg by mouth at bedtime.   Yes [provider]  metoprolol succinate (TOPROL-XL) 25 MG 24 hr tablet Take 25 mg by mouth daily.   Yes [provider]  miconazole (MICOTIN) 2 % powder Apply 1 application topically in the morning and at bedtime.   Yes [provider]  phenytoin (DILANTIN) 100 MG ER capsule Take 100 mg by mouth 3 (three) times daily.   Yes [provider]  testosterone cypionate (DEPOTESTOSTERONE CYPIONATE) 200 MG/ML injection Inject 200 mg into the muscle every 28 (twenty-eight) days.   Yes [provider]  zolpidem (AMBIEN) 5 MG tablet Take 5 mg by mouth at bedtime.   Yes [provider]    Allergies Celebrex [celecoxib] and Mirtazapine  Family History  Problem Relation Age of Onset  . Cirrhosis Father        of liver  . Cancer Daughter   . Kidney disease Neg Hx   . Prostate cancer Neg Hx   . Kidney cancer Neg  Hx   . Bladder Cancer Neg Hx     Social History Social History   Tobacco Use  . Smoking status: Former Smoker    Packs/day: 0.50    Years: 20.00    Pack years: 10.00    Quit date: 05/01/1977    Years since quitting: 42.4  . Smokeless tobacco: Never Used  Vaping Use  . Vaping Use: Never used  Substance Use Topics  . Alcohol use: Yes    Alcohol/week: 7.0 - 14.0 standard drinks    Types: 7 - 14 Shots of liquor per week    Comment: moderate; 1-2 drinks daily  . Drug use: No    Review of Systems Constitutional: No fever/chills or recent illness other than his head injury, currently in rehab but doing well until this fall. Eyes: No visual changes. ENT: No sore throat.  Slight soreness upper neck Cardiovascular: Denies chest pain. Respiratory: Denies shortness of breath. Gastrointestinal: No abdominal pain.   Genitourinary: Negative for dysuria. Musculoskeletal: Negative for back pain. Skin: Positive for skin tear on her  right elbow also a small 1 on his right knee and bruising and some swelling over his right forehead. Neurological: Negative for headaches, areas of focal weakness or numbness.  Daughter reports patient had Covid and recovered in approximately March ____________________________________________   PHYSICAL EXAM:  VITAL SIGNS: ED Triage Vitals  Enc Vitals Group     BP 10/14/19 1738 (!) 105/59     Pulse Rate 10/14/19 1738 72     Resp 10/14/19 1738 16     Temp 10/14/19 1738 97.7 F (36.5 C)     Temp Source 10/14/19 1738 Oral     SpO2 10/14/19 1738 100 %     Weight 10/14/19 1743 137 lb (62.1 kg)     Height 10/14/19 1743 5\' 10"  (1.778 m)     Head Circumference --      [facility] --      Pain Score --      Pain Loc --      Pain Edu? --      Excl. in [place]? --     Constitutional: Alert and oriented to self, being at rehab, and to his daughter but not to year. Well appearing and in no acute distress. Eyes: Conjunctivae are normal. Head: Atraumatic. Nose: No congestion/rhinnorhea. Mouth/Throat: Mucous membranes are moist. Neck: No stridor.  Cardiovascular: Normal rate, regular rhythm. Grossly normal heart sounds.  Good peripheral circulation. Respiratory: Normal respiratory effort.  No retractions. Lungs CTAB. Gastrointestinal: Soft and nontender. No distention. Musculoskeletal: No lower extremity tenderness nor edema. Neurologic:  Normal speech and language. No gross focal neurologic deficits are appreciated.  Skin:  Skin is warm, dry and intact. No rash noted. Psychiatric: Mood and affect are normal to slightly flat. Speech and behavior are normal.  ____________________________________________   LABS (all labs ordered are listed, but only abnormal results are displayed)  Labs Reviewed  CBC - Abnormal; Notable for the following components:      Result Value   WBC 12.3 (*)    RBC 3.77 (*)    Hemoglobin 11.7 (*)    HCT 35.0 (*)    All other components within normal limits    BASIC METABOLIC PANEL - Abnormal; Notable for the following components:   Glucose, Bld 133 (*)    BUN 43 (*)    Creatinine, Ser 1.96 (*)    GFR calc non Af Amer 30 (*)    GFR calc Af Wyvonnia Lora  35 (*)    All other components within normal limits  PHENYTOIN LEVEL, TOTAL   ____________________________________________  EKG  Reviewed interpreted 1745 Heart rate 80 QRS 120 QTc 500 Normal sinus rhythm, baseline artifact, no evidence acute ischemia denoted. ____________________________________________  RADIOLOGY  DG Elbow Complete Right  Result Date: 10/14/2019 CLINICAL DATA:  Right elbow swelling after fall. EXAM: RIGHT ELBOW - COMPLETE 3+ VIEW COMPARISON:  October 14, 2016. FINDINGS: There is no evidence of fracture, dislocation, or joint effusion. There is no evidence of arthropathy or other focal bone abnormality. Laceration is seen overlying lateral aspect of the elbow. IMPRESSION: No fracture or dislocation is noted. Laceration is seen overlying lateral aspect of the elbow. Electronically Signed   By: Marijo Conception M.D.   On: 10/14/2019 18:25   CT Head Wo Contrast  Result Date: 10/14/2019 CLINICAL DATA:  Fall today.  No loss of consciousness. EXAM: CT HEAD WITHOUT CONTRAST CT CERVICAL SPINE WITHOUT CONTRAST TECHNIQUE: Multidetector CT imaging of the head and cervical spine was performed following the standard protocol without intravenous contrast. Multiplanar CT image reconstructions of the cervical spine were also generated. COMPARISON:  Sep 18, 2019. FINDINGS: CT HEAD FINDINGS Brain: Mild diffuse cortical atrophy is noted. Mild chronic ischemic white matter disease is noted. No mass effect or midline shift is noted. Ventricular size is within normal limits. There is no evidence of mass lesion, hemorrhage or acute infarction. Vascular: No hyperdense vessel or unexpected calcification. Skull: Normal. Negative for fracture or focal lesion. Sinuses/Orbits: No acute finding. Other: Small right  frontal scalp hematoma is noted. CT CERVICAL SPINE FINDINGS Alignment: Mild grade 1 anterolisthesis of C3-4 is noted secondary to posterior facet joint hypertrophy. Skull base and vertebrae: No acute fracture. No primary bone lesion or focal pathologic process. Soft tissues and spinal canal: No prevertebral fluid or swelling. No visible canal hematoma. Disc levels: Severe degenerative disc disease is noted at C3-4, C4-5, C5-6 and C6-7. Upper chest: Negative. Other: None. IMPRESSION: 1. Mild diffuse cortical atrophy. Mild chronic ischemic white matter disease. Small right frontal scalp hematoma. No acute intracranial abnormality seen. 2. Severe multilevel degenerative disc disease. No acute abnormality seen in the cervical spine. Electronically Signed   By: Marijo Conception M.D.   On: 10/14/2019 18:41   CT Cervical Spine Wo Contrast  Result Date: 10/14/2019 CLINICAL DATA:  Fall today.  No loss of consciousness. EXAM: CT HEAD WITHOUT CONTRAST CT CERVICAL SPINE WITHOUT CONTRAST TECHNIQUE: Multidetector CT imaging of the head and cervical spine was performed following the standard protocol without intravenous contrast. Multiplanar CT image reconstructions of the cervical spine were also generated. COMPARISON:  Sep 18, 2019. FINDINGS: CT HEAD FINDINGS Brain: Mild diffuse cortical atrophy is noted. Mild chronic ischemic white matter disease is noted. No mass effect or midline shift is noted. Ventricular size is within normal limits. There is no evidence of mass lesion, hemorrhage or acute infarction. Vascular: No hyperdense vessel or unexpected calcification. Skull: Normal. Negative for fracture or focal lesion. Sinuses/Orbits: No acute finding. Other: Small right frontal scalp hematoma is noted. CT CERVICAL SPINE FINDINGS Alignment: Mild grade 1 anterolisthesis of C3-4 is noted secondary to posterior facet joint hypertrophy. Skull base and vertebrae: No acute fracture. No primary bone lesion or focal pathologic  process. Soft tissues and spinal canal: No prevertebral fluid or swelling. No visible canal hematoma. Disc levels: Severe degenerative disc disease is noted at C3-4, C4-5, C5-6 and C6-7. Upper chest: Negative. Other: None. IMPRESSION: 1. Mild diffuse cortical  atrophy. Mild chronic ischemic white matter disease. Small right frontal scalp hematoma. No acute intracranial abnormality seen. 2. Severe multilevel degenerative disc disease. No acute abnormality seen in the cervical spine. Electronically Signed   By: Marijo Conception M.D.   On: 10/14/2019 18:41     ____________________________________________   PROCEDURES  Procedure(s) performed: None  Procedures  Critical Care performed: No  ____________________________________________   INITIAL IMPRESSION / ASSESSMENT AND PLAN / ED COURSE  Pertinent labs & imaging results that were available during my care of the patient were reviewed by me and considered in my medical decision making (see chart for details).   Patient presents after fall.  Appears likely mechanical, witnessed to stumble or fall to wheelchair striking his head without loss consciousness.  Does appear somewhat mildly dehydrated by clinical history exam.  He is alert and oriented, confused at baseline since his previous head injury.  CT scans reassuring.  Lab work demonstrating mildly elevated creatinine, hydrated here with 1 L fluid.  No recent precipitating symptoms or factors such as chest pain shortness of breath, acute neurologic symptoms etc. denoted.  Clinical Course as of Oct 13 2025  Mon Oct 14, 2019  6811 Lab work reviewed, patient resting comfortably.  CT scans negative for acute injury.  Skin wounds cleansed, skin tear noted on the right elbow, right knee, right inner medial thigh, all cleansed and Tegaderm applied.  Mild worsening of renal function, suspect likely due to dehydration daughter reports he has been reporting these been thirsty but has had somewhat  limited water intake since arriving to [facility].  We will give additional fluid bolus, discussed with family and they will plan to follow-up and also have him reevaluated by facility physician in repeat testing for his kidneys in about a week   [MQ]    Clinical Course User Index [MQ] Delman Kitten, MD    ----------------------------------------- 8:29 PM on 10/14/2019 -----------------------------------------  Resting comfortably, requesting something to eat and drink.  Discussed with family, patient is nonambulatory at this time have been at baseline for the last month, will return to [facility].  Return precautions and treatment recommendations and follow-up discussed with the patient and his daughter at bedside who is agreeable with the plan.   ____________________________________________   FINAL CLINICAL IMPRESSION(S) / ED DIAGNOSES  Final diagnoses:  Dehydration, mild  Traumatic hematoma of forehead, initial encounter  Multiple skin tears        Note:  This document was prepared using Dragon voice recognition software and may include unintentional dictation errors       Delman Kitten, MD 10/14/19 2029

## 2019-10-14 NOTE — ED Triage Notes (Signed)
Pt arrives from [facility] via [facility] following a witnessed fall at facility this afternoon. Pt presents w/ skin tear to rt elbow and hematoma above left eye. Pt c/o neck pain. Pt denies LOC. Pt had BP 85/59 on arrival received 250 ml NS bolus BP went to 108/59. Pt on plavix.

## 2019-10-14 NOTE — Discharge Instructions (Signed)
Please have the patient's physician evaluate around upon him in the next few days.  I recommend also that he have laboratory testing scheduled to reevaluate his renal function and creatinine.

## 2019-10-15 ENCOUNTER — Encounter: Payer: Self-pay | Admitting: Urology

## 2019-10-16 ENCOUNTER — Other Ambulatory Visit: Payer: Self-pay | Admitting: *Deleted

## 2019-10-16 DIAGNOSIS — I10 Essential (primary) hypertension: Secondary | ICD-10-CM | POA: Diagnosis not present

## 2019-10-16 DIAGNOSIS — F0781 Postconcussional syndrome: Secondary | ICD-10-CM | POA: Diagnosis not present

## 2019-10-16 DIAGNOSIS — E785 Hyperlipidemia, unspecified: Secondary | ICD-10-CM | POA: Diagnosis not present

## 2019-10-16 DIAGNOSIS — M25511 Pain in right shoulder: Secondary | ICD-10-CM | POA: Diagnosis not present

## 2019-10-16 DIAGNOSIS — I2510 Atherosclerotic heart disease of native coronary artery without angina pectoris: Secondary | ICD-10-CM | POA: Diagnosis not present

## 2019-10-16 DIAGNOSIS — R569 Unspecified convulsions: Secondary | ICD-10-CM | POA: Diagnosis not present

## 2019-10-16 NOTE — Patient Outreach (Signed)
Late entry for 10/15/19  Member screened for potential [facility] Care Management needs as a benefit of [place] [employer].  Mr. Skog is receiving skilled therapy at [facility].  Update received from [place] after telephonic IDT meeting with facility.   Mr. Pola is from home alone. He is currently max to dependent and impulsive with therapy. He is non-ambulatory. Care plan meeting scheduled with family for 10/16/19.   Noted Mr. Skoczylas's PCP with [facility] has [place] Management team. Will update [facility] ECM team as appropriate.  Will continue to follow while member resides in SNF.   Marthenia Rolling, MSN-Ed, RN,BSN [place] Acute Care Coordinator [phone] [facility]) [phone]  (Toll free office)

## 2019-10-22 ENCOUNTER — Other Ambulatory Visit: Payer: Self-pay | Admitting: *Deleted

## 2019-10-22 NOTE — Patient Outreach (Signed)
Screened for potential [facility] Care Management needs as a benefit of  [employer].  Ivan Salazar is currently receiving skilled therapy at [facility].   Writer attended telephonic interdisciplinary team meeting to assess for disposition needs and transition plan for resident.   Facility reports member is making progress. Anticipated transition plan is for member to move to [place] with family. States member's daughter is looking into long term care facilities in [place]. Family to come from out of town tomorrow to visit with Ivan Salazar.  Will continue to follow for transition plans while member resides in SNF.   Marthenia Rolling, MSN-Ed, RN,BSN [place] Acute Care Coordinator [phone] [facility]) [phone]  (Toll free office)

## 2019-10-28 DIAGNOSIS — G9340 Encephalopathy, unspecified: Secondary | ICD-10-CM | POA: Diagnosis not present

## 2019-10-28 DIAGNOSIS — I495 Sick sinus syndrome: Secondary | ICD-10-CM | POA: Diagnosis not present

## 2019-10-29 ENCOUNTER — Other Ambulatory Visit: Payer: Self-pay | Admitting: *Deleted

## 2019-10-29 NOTE — Patient Outreach (Signed)
Screened for potential [facility] Care Management needs as a benefit of  [employer].  Ivan Salazar is currently receiving skilled therapy at [facility].   Writer attended telephonic interdisciplinary team meeting to assess for disposition needs and transition plan for resident.   Facility reports member is progressing with therapy and is ambulating around 50 to 60 feet. Transition plan is to transfer to SNF in [place] with one of his daughters. Facility reports family to arrange transportation.   Will continue to follow for transition plans while member resides in [facility].    Ivan Rolling, MSN-Ed, RN,BSN [place] Acute Care Coordinator [phone] [facility]) [phone]  (Toll free office)

## 2019-10-31 ENCOUNTER — Telehealth: Payer: Self-pay

## 2019-10-31 DIAGNOSIS — E785 Hyperlipidemia, unspecified: Secondary | ICD-10-CM | POA: Diagnosis not present

## 2019-10-31 DIAGNOSIS — I739 Peripheral vascular disease, unspecified: Secondary | ICD-10-CM | POA: Diagnosis not present

## 2019-10-31 DIAGNOSIS — I10 Essential (primary) hypertension: Secondary | ICD-10-CM | POA: Diagnosis not present

## 2019-10-31 DIAGNOSIS — E299 Testicular dysfunction, unspecified: Secondary | ICD-10-CM | POA: Diagnosis not present

## 2019-10-31 DIAGNOSIS — E43 Unspecified severe protein-calorie malnutrition: Secondary | ICD-10-CM | POA: Diagnosis not present

## 2019-10-31 DIAGNOSIS — R488 Other symbolic dysfunctions: Secondary | ICD-10-CM | POA: Diagnosis not present

## 2019-10-31 DIAGNOSIS — K2101 Gastro-esophageal reflux disease with esophagitis, with bleeding: Secondary | ICD-10-CM | POA: Diagnosis not present

## 2019-10-31 DIAGNOSIS — F0781 Postconcussional syndrome: Secondary | ICD-10-CM | POA: Diagnosis not present

## 2019-10-31 DIAGNOSIS — M6281 Muscle weakness (generalized): Secondary | ICD-10-CM | POA: Diagnosis not present

## 2019-10-31 DIAGNOSIS — R2681 Unsteadiness on feet: Secondary | ICD-10-CM | POA: Diagnosis not present

## 2019-10-31 DIAGNOSIS — R498 Other voice and resonance disorders: Secondary | ICD-10-CM | POA: Diagnosis not present

## 2019-10-31 DIAGNOSIS — R561 Post traumatic seizures: Secondary | ICD-10-CM | POA: Diagnosis not present

## 2019-10-31 DIAGNOSIS — E569 Vitamin deficiency, unspecified: Secondary | ICD-10-CM | POA: Diagnosis not present

## 2019-10-31 DIAGNOSIS — F445 Conversion disorder with seizures or convulsions: Secondary | ICD-10-CM | POA: Diagnosis not present

## 2019-10-31 DIAGNOSIS — R1312 Dysphagia, oropharyngeal phase: Secondary | ICD-10-CM | POA: Diagnosis not present

## 2019-10-31 NOTE — Telephone Encounter (Signed)
LMOM to contact our office regarding an appointment that is scheduled for November 11, 2019 with Dr. Rockey Situ.

## 2019-11-05 ENCOUNTER — Other Ambulatory Visit: Payer: Self-pay | Admitting: *Deleted

## 2019-11-05 NOTE — Patient Outreach (Signed)
Screened for potential [facility] Care Management needs as a benefit of  [employer].  Ivan Salazar is currently receiving skilled therapy at [facility].   Writer attended telephonic interdisciplinary team meeting to assess for disposition needs and transition plan for resident.   Facility reports Ivan Salazar will transfer to [facility] in [place] on this Thursday, July 8th.   No further identifiable [facility] Care Management needs at this time. Will make [facility]edded Chronic Care Management team aware that member is relocating to [place].   Marthenia Rolling, MSN-Ed, RN,BSN [place] Acute Care Coordinator [phone] [facility]) [phone]  (Toll free office)

## 2019-11-07 DIAGNOSIS — I10 Essential (primary) hypertension: Secondary | ICD-10-CM | POA: Diagnosis not present

## 2019-11-07 DIAGNOSIS — M6281 Muscle weakness (generalized): Secondary | ICD-10-CM | POA: Diagnosis not present

## 2019-11-07 DIAGNOSIS — Z8616 Personal history of COVID-19: Secondary | ICD-10-CM | POA: Diagnosis not present

## 2019-11-07 DIAGNOSIS — K2101 Gastro-esophageal reflux disease with esophagitis, with bleeding: Secondary | ICD-10-CM | POA: Diagnosis not present

## 2019-11-07 DIAGNOSIS — K5909 Other constipation: Secondary | ICD-10-CM | POA: Diagnosis not present

## 2019-11-07 DIAGNOSIS — R2689 Other abnormalities of gait and mobility: Secondary | ICD-10-CM | POA: Diagnosis not present

## 2019-11-07 DIAGNOSIS — I2510 Atherosclerotic heart disease of native coronary artery without angina pectoris: Secondary | ICD-10-CM | POA: Diagnosis not present

## 2019-11-07 DIAGNOSIS — E785 Hyperlipidemia, unspecified: Secondary | ICD-10-CM | POA: Diagnosis not present

## 2019-11-07 DIAGNOSIS — R1310 Dysphagia, unspecified: Secondary | ICD-10-CM | POA: Diagnosis not present

## 2019-11-07 DIAGNOSIS — S06330D Contusion and laceration of cerebrum, unspecified, without loss of consciousness, subsequent encounter: Secondary | ICD-10-CM | POA: Diagnosis not present

## 2019-11-07 DIAGNOSIS — I739 Peripheral vascular disease, unspecified: Secondary | ICD-10-CM | POA: Diagnosis not present

## 2019-11-07 DIAGNOSIS — R561 Post traumatic seizures: Secondary | ICD-10-CM | POA: Diagnosis not present

## 2019-11-07 DIAGNOSIS — R05 Cough: Secondary | ICD-10-CM | POA: Diagnosis not present

## 2019-11-07 DIAGNOSIS — G4089 Other seizures: Secondary | ICD-10-CM | POA: Diagnosis not present

## 2019-11-07 DIAGNOSIS — F0781 Postconcussional syndrome: Secondary | ICD-10-CM | POA: Diagnosis not present

## 2019-11-07 DIAGNOSIS — D649 Anemia, unspecified: Secondary | ICD-10-CM | POA: Diagnosis not present

## 2019-11-07 DIAGNOSIS — L259 Unspecified contact dermatitis, unspecified cause: Secondary | ICD-10-CM | POA: Diagnosis not present

## 2019-11-07 DIAGNOSIS — Z950 Presence of cardiac pacemaker: Secondary | ICD-10-CM | POA: Diagnosis not present

## 2019-11-07 DIAGNOSIS — L89620 Pressure ulcer of left heel, unstageable: Secondary | ICD-10-CM | POA: Diagnosis not present

## 2019-11-07 DIAGNOSIS — I25799 Atherosclerosis of other coronary artery bypass graft(s) with unspecified angina pectoris: Secondary | ICD-10-CM | POA: Diagnosis not present

## 2019-11-07 DIAGNOSIS — Z9181 History of falling: Secondary | ICD-10-CM | POA: Diagnosis not present

## 2019-11-07 DIAGNOSIS — R488 Other symbolic dysfunctions: Secondary | ICD-10-CM | POA: Diagnosis not present

## 2019-11-07 DIAGNOSIS — L89623 Pressure ulcer of left heel, stage 3: Secondary | ICD-10-CM | POA: Diagnosis not present

## 2019-11-07 DIAGNOSIS — R630 Anorexia: Secondary | ICD-10-CM | POA: Diagnosis not present

## 2019-11-07 DIAGNOSIS — M159 Polyosteoarthritis, unspecified: Secondary | ICD-10-CM | POA: Diagnosis not present

## 2019-11-07 DIAGNOSIS — D638 Anemia in other chronic diseases classified elsewhere: Secondary | ICD-10-CM | POA: Diagnosis not present

## 2019-11-07 DIAGNOSIS — G4700 Insomnia, unspecified: Secondary | ICD-10-CM | POA: Diagnosis not present

## 2019-11-08 DIAGNOSIS — S06330D Contusion and laceration of cerebrum, unspecified, without loss of consciousness, subsequent encounter: Secondary | ICD-10-CM | POA: Diagnosis not present

## 2019-11-08 DIAGNOSIS — G4089 Other seizures: Secondary | ICD-10-CM | POA: Diagnosis not present

## 2019-11-08 DIAGNOSIS — G4700 Insomnia, unspecified: Secondary | ICD-10-CM | POA: Diagnosis not present

## 2019-11-08 DIAGNOSIS — I2510 Atherosclerotic heart disease of native coronary artery without angina pectoris: Secondary | ICD-10-CM | POA: Diagnosis not present

## 2019-11-09 NOTE — Progress Notes (Deleted)
NO SHOW

## 2019-11-11 ENCOUNTER — Ambulatory Visit: Payer: Medicare Other | Admitting: Cardiovascular Disease

## 2019-11-13 ENCOUNTER — Encounter: Payer: Self-pay | Admitting: Cardiovascular Disease

## 2019-11-13 DIAGNOSIS — L89620 Pressure ulcer of left heel, unstageable: Secondary | ICD-10-CM | POA: Diagnosis not present

## 2019-11-13 DIAGNOSIS — L259 Unspecified contact dermatitis, unspecified cause: Secondary | ICD-10-CM | POA: Diagnosis not present

## 2019-11-18 ENCOUNTER — Ambulatory Visit: Payer: Medicare Other

## 2019-11-18 ENCOUNTER — Ambulatory Visit: Payer: Self-pay

## 2019-11-19 ENCOUNTER — Encounter: Payer: Self-pay | Admitting: Urology

## 2019-11-22 DIAGNOSIS — R05 Cough: Secondary | ICD-10-CM | POA: Diagnosis not present

## 2019-11-22 DIAGNOSIS — Z9181 History of falling: Secondary | ICD-10-CM | POA: Diagnosis not present

## 2019-11-22 DIAGNOSIS — I10 Essential (primary) hypertension: Secondary | ICD-10-CM | POA: Diagnosis not present

## 2019-11-22 DIAGNOSIS — G4700 Insomnia, unspecified: Secondary | ICD-10-CM | POA: Diagnosis not present

## 2019-12-05 ENCOUNTER — Other Ambulatory Visit: Payer: Medicare Other

## 2019-12-06 ENCOUNTER — Telehealth: Payer: Self-pay

## 2019-12-06 NOTE — Telephone Encounter (Signed)
Copied from [place] [id number]. Topic: General - Inquiry >> Dec 06, 2019  9:50 AM Greggory Keen D wrote: Reason for CRM: Pt's daughter called saying her father is living in [place] in an care facility there.  They will be sending a fax asking for his Medical Records.    [place]

## 2019-12-09 DIAGNOSIS — I10 Essential (primary) hypertension: Secondary | ICD-10-CM | POA: Diagnosis not present

## 2019-12-09 DIAGNOSIS — G4700 Insomnia, unspecified: Secondary | ICD-10-CM | POA: Diagnosis not present

## 2019-12-09 DIAGNOSIS — I2510 Atherosclerotic heart disease of native coronary artery without angina pectoris: Secondary | ICD-10-CM | POA: Diagnosis not present

## 2019-12-09 DIAGNOSIS — D638 Anemia in other chronic diseases classified elsewhere: Secondary | ICD-10-CM | POA: Diagnosis not present

## 2019-12-25 DIAGNOSIS — L89623 Pressure ulcer of left heel, stage 3: Secondary | ICD-10-CM | POA: Diagnosis not present

## 2019-12-31 DIAGNOSIS — L97421 Non-pressure chronic ulcer of left heel and midfoot limited to breakdown of skin: Secondary | ICD-10-CM | POA: Diagnosis not present

## 2020-01-01 DIAGNOSIS — I70244 Atherosclerosis of native arteries of left leg with ulceration of heel and midfoot: Secondary | ICD-10-CM | POA: Diagnosis not present

## 2020-01-01 DIAGNOSIS — L89622 Pressure ulcer of left heel, stage 2: Secondary | ICD-10-CM | POA: Diagnosis not present

## 2020-01-08 DIAGNOSIS — L89623 Pressure ulcer of left heel, stage 3: Secondary | ICD-10-CM | POA: Diagnosis not present

## 2020-01-13 DIAGNOSIS — F5101 Primary insomnia: Secondary | ICD-10-CM | POA: Diagnosis not present

## 2020-01-15 DIAGNOSIS — L89623 Pressure ulcer of left heel, stage 3: Secondary | ICD-10-CM | POA: Diagnosis not present

## 2020-01-17 DIAGNOSIS — E782 Mixed hyperlipidemia: Secondary | ICD-10-CM | POA: Diagnosis not present

## 2020-01-17 DIAGNOSIS — G4700 Insomnia, unspecified: Secondary | ICD-10-CM | POA: Diagnosis not present

## 2020-01-17 DIAGNOSIS — L89620 Pressure ulcer of left heel, unstageable: Secondary | ICD-10-CM | POA: Diagnosis not present

## 2020-01-17 DIAGNOSIS — I2510 Atherosclerotic heart disease of native coronary artery without angina pectoris: Secondary | ICD-10-CM | POA: Diagnosis not present

## 2020-01-29 DIAGNOSIS — L89623 Pressure ulcer of left heel, stage 3: Secondary | ICD-10-CM | POA: Diagnosis not present

## 2020-02-21 DIAGNOSIS — I2510 Atherosclerotic heart disease of native coronary artery without angina pectoris: Secondary | ICD-10-CM | POA: Diagnosis not present

## 2020-02-21 DIAGNOSIS — G4700 Insomnia, unspecified: Secondary | ICD-10-CM | POA: Diagnosis not present

## 2020-02-21 DIAGNOSIS — E782 Mixed hyperlipidemia: Secondary | ICD-10-CM | POA: Diagnosis not present

## 2020-02-21 DIAGNOSIS — I10 Essential (primary) hypertension: Secondary | ICD-10-CM | POA: Diagnosis not present

## 2020-03-12 DIAGNOSIS — M6281 Muscle weakness (generalized): Secondary | ICD-10-CM | POA: Diagnosis not present

## 2020-03-12 DIAGNOSIS — R561 Post traumatic seizures: Secondary | ICD-10-CM | POA: Diagnosis not present

## 2020-03-12 DIAGNOSIS — R2689 Other abnormalities of gait and mobility: Secondary | ICD-10-CM | POA: Diagnosis not present

## 2020-03-13 DIAGNOSIS — L89620 Pressure ulcer of left heel, unstageable: Secondary | ICD-10-CM | POA: Diagnosis not present

## 2020-03-13 DIAGNOSIS — I739 Peripheral vascular disease, unspecified: Secondary | ICD-10-CM | POA: Diagnosis not present

## 2020-03-13 DIAGNOSIS — K2100 Gastro-esophageal reflux disease with esophagitis, without bleeding: Secondary | ICD-10-CM | POA: Diagnosis not present

## 2020-03-13 DIAGNOSIS — E785 Hyperlipidemia, unspecified: Secondary | ICD-10-CM | POA: Diagnosis not present

## 2020-03-13 DIAGNOSIS — M6281 Muscle weakness (generalized): Secondary | ICD-10-CM | POA: Diagnosis not present

## 2020-03-13 DIAGNOSIS — R561 Post traumatic seizures: Secondary | ICD-10-CM | POA: Diagnosis not present

## 2020-03-13 DIAGNOSIS — G4700 Insomnia, unspecified: Secondary | ICD-10-CM | POA: Diagnosis not present

## 2020-03-13 DIAGNOSIS — R2689 Other abnormalities of gait and mobility: Secondary | ICD-10-CM | POA: Diagnosis not present

## 2020-03-13 DIAGNOSIS — D649 Anemia, unspecified: Secondary | ICD-10-CM | POA: Diagnosis not present

## 2020-03-13 DIAGNOSIS — I10 Essential (primary) hypertension: Secondary | ICD-10-CM | POA: Diagnosis not present

## 2020-03-13 DIAGNOSIS — I25799 Atherosclerosis of other coronary artery bypass graft(s) with unspecified angina pectoris: Secondary | ICD-10-CM | POA: Diagnosis not present

## 2020-03-13 DIAGNOSIS — M159 Polyosteoarthritis, unspecified: Secondary | ICD-10-CM | POA: Diagnosis not present

## 2020-03-13 DIAGNOSIS — F0781 Postconcussional syndrome: Secondary | ICD-10-CM | POA: Diagnosis not present

## 2020-03-16 DIAGNOSIS — R2689 Other abnormalities of gait and mobility: Secondary | ICD-10-CM | POA: Diagnosis not present

## 2020-03-16 DIAGNOSIS — R561 Post traumatic seizures: Secondary | ICD-10-CM | POA: Diagnosis not present

## 2020-03-16 DIAGNOSIS — M6281 Muscle weakness (generalized): Secondary | ICD-10-CM | POA: Diagnosis not present

## 2020-03-17 DIAGNOSIS — R2689 Other abnormalities of gait and mobility: Secondary | ICD-10-CM | POA: Diagnosis not present

## 2020-03-17 DIAGNOSIS — R561 Post traumatic seizures: Secondary | ICD-10-CM | POA: Diagnosis not present

## 2020-03-17 DIAGNOSIS — M6281 Muscle weakness (generalized): Secondary | ICD-10-CM | POA: Diagnosis not present

## 2020-03-19 DIAGNOSIS — R561 Post traumatic seizures: Secondary | ICD-10-CM | POA: Diagnosis not present

## 2020-03-19 DIAGNOSIS — M6281 Muscle weakness (generalized): Secondary | ICD-10-CM | POA: Diagnosis not present

## 2020-03-19 DIAGNOSIS — R2689 Other abnormalities of gait and mobility: Secondary | ICD-10-CM | POA: Diagnosis not present

## 2020-03-20 DIAGNOSIS — I739 Peripheral vascular disease, unspecified: Secondary | ICD-10-CM | POA: Diagnosis not present

## 2020-03-20 DIAGNOSIS — R2689 Other abnormalities of gait and mobility: Secondary | ICD-10-CM | POA: Diagnosis not present

## 2020-03-20 DIAGNOSIS — M6281 Muscle weakness (generalized): Secondary | ICD-10-CM | POA: Diagnosis not present

## 2020-03-20 DIAGNOSIS — F0781 Postconcussional syndrome: Secondary | ICD-10-CM | POA: Diagnosis not present

## 2020-03-20 DIAGNOSIS — L89620 Pressure ulcer of left heel, unstageable: Secondary | ICD-10-CM | POA: Diagnosis not present

## 2020-03-20 DIAGNOSIS — G4700 Insomnia, unspecified: Secondary | ICD-10-CM | POA: Diagnosis not present

## 2020-03-20 DIAGNOSIS — K2100 Gastro-esophageal reflux disease with esophagitis, without bleeding: Secondary | ICD-10-CM | POA: Diagnosis not present

## 2020-03-20 DIAGNOSIS — I10 Essential (primary) hypertension: Secondary | ICD-10-CM | POA: Diagnosis not present

## 2020-03-20 DIAGNOSIS — Z23 Encounter for immunization: Secondary | ICD-10-CM | POA: Diagnosis not present

## 2020-03-20 DIAGNOSIS — M159 Polyosteoarthritis, unspecified: Secondary | ICD-10-CM | POA: Diagnosis not present

## 2020-03-20 DIAGNOSIS — E785 Hyperlipidemia, unspecified: Secondary | ICD-10-CM | POA: Diagnosis not present

## 2020-03-20 DIAGNOSIS — R561 Post traumatic seizures: Secondary | ICD-10-CM | POA: Diagnosis not present

## 2020-03-20 DIAGNOSIS — D649 Anemia, unspecified: Secondary | ICD-10-CM | POA: Diagnosis not present

## 2020-03-20 DIAGNOSIS — I25799 Atherosclerosis of other coronary artery bypass graft(s) with unspecified angina pectoris: Secondary | ICD-10-CM | POA: Diagnosis not present

## 2020-03-23 DIAGNOSIS — R561 Post traumatic seizures: Secondary | ICD-10-CM | POA: Diagnosis not present

## 2020-03-23 DIAGNOSIS — R2689 Other abnormalities of gait and mobility: Secondary | ICD-10-CM | POA: Diagnosis not present

## 2020-03-23 DIAGNOSIS — M6281 Muscle weakness (generalized): Secondary | ICD-10-CM | POA: Diagnosis not present

## 2020-03-24 DIAGNOSIS — M6281 Muscle weakness (generalized): Secondary | ICD-10-CM | POA: Diagnosis not present

## 2020-03-24 DIAGNOSIS — R2689 Other abnormalities of gait and mobility: Secondary | ICD-10-CM | POA: Diagnosis not present

## 2020-03-24 DIAGNOSIS — R561 Post traumatic seizures: Secondary | ICD-10-CM | POA: Diagnosis not present

## 2020-03-25 DIAGNOSIS — K2100 Gastro-esophageal reflux disease with esophagitis, without bleeding: Secondary | ICD-10-CM | POA: Diagnosis not present

## 2020-03-25 DIAGNOSIS — I10 Essential (primary) hypertension: Secondary | ICD-10-CM | POA: Diagnosis not present

## 2020-03-25 DIAGNOSIS — L89620 Pressure ulcer of left heel, unstageable: Secondary | ICD-10-CM | POA: Diagnosis not present

## 2020-03-25 DIAGNOSIS — R561 Post traumatic seizures: Secondary | ICD-10-CM | POA: Diagnosis not present

## 2020-03-25 DIAGNOSIS — I25799 Atherosclerosis of other coronary artery bypass graft(s) with unspecified angina pectoris: Secondary | ICD-10-CM | POA: Diagnosis not present

## 2020-03-25 DIAGNOSIS — E785 Hyperlipidemia, unspecified: Secondary | ICD-10-CM | POA: Diagnosis not present

## 2020-03-25 DIAGNOSIS — G4700 Insomnia, unspecified: Secondary | ICD-10-CM | POA: Diagnosis not present

## 2020-03-25 DIAGNOSIS — D649 Anemia, unspecified: Secondary | ICD-10-CM | POA: Diagnosis not present

## 2020-03-25 DIAGNOSIS — F0781 Postconcussional syndrome: Secondary | ICD-10-CM | POA: Diagnosis not present

## 2020-03-25 DIAGNOSIS — I739 Peripheral vascular disease, unspecified: Secondary | ICD-10-CM | POA: Diagnosis not present

## 2020-03-25 DIAGNOSIS — R2689 Other abnormalities of gait and mobility: Secondary | ICD-10-CM | POA: Diagnosis not present

## 2020-03-25 DIAGNOSIS — M6281 Muscle weakness (generalized): Secondary | ICD-10-CM | POA: Diagnosis not present

## 2020-03-25 DIAGNOSIS — M159 Polyosteoarthritis, unspecified: Secondary | ICD-10-CM | POA: Diagnosis not present

## 2020-04-06 DIAGNOSIS — F5101 Primary insomnia: Secondary | ICD-10-CM | POA: Diagnosis not present

## 2020-04-13 DIAGNOSIS — Z961 Presence of intraocular lens: Secondary | ICD-10-CM | POA: Diagnosis not present

## 2020-04-13 DIAGNOSIS — H3342 Traction detachment of retina, left eye: Secondary | ICD-10-CM | POA: Diagnosis not present

## 2020-04-13 DIAGNOSIS — H401133 Primary open-angle glaucoma, bilateral, severe stage: Secondary | ICD-10-CM | POA: Diagnosis not present

## 2020-04-13 DIAGNOSIS — H532 Diplopia: Secondary | ICD-10-CM | POA: Diagnosis not present

## 2020-04-16 DIAGNOSIS — R561 Post traumatic seizures: Secondary | ICD-10-CM | POA: Diagnosis not present

## 2020-04-21 DIAGNOSIS — R561 Post traumatic seizures: Secondary | ICD-10-CM | POA: Diagnosis not present

## 2020-04-22 DIAGNOSIS — H409 Unspecified glaucoma: Secondary | ICD-10-CM | POA: Diagnosis not present

## 2020-04-22 DIAGNOSIS — I10 Essential (primary) hypertension: Secondary | ICD-10-CM | POA: Diagnosis not present

## 2020-04-22 DIAGNOSIS — E782 Mixed hyperlipidemia: Secondary | ICD-10-CM | POA: Diagnosis not present

## 2020-04-22 DIAGNOSIS — Z950 Presence of cardiac pacemaker: Secondary | ICD-10-CM | POA: Diagnosis not present

## 2020-04-23 DIAGNOSIS — R561 Post traumatic seizures: Secondary | ICD-10-CM | POA: Diagnosis not present

## 2020-05-18 NOTE — Progress Notes (Signed)
Subjective:   Ivan Salazar is a 87 y.o. male who presents for Medicare Annual/Subsequent preventive examination.  I connected with Ivan Salazar today by telephone and verified that I am speaking with the correct person using two identifiers. Location patient: home Location provider: work Persons participating in the virtual visit: patient, provider.   I discussed the limitations, risks, security and privacy concerns of performing an evaluation and management service by telephone and the availability of in person appointments. I also discussed with the patient that there may be a patient responsible charge related to this service. The patient expressed understanding and verbally consented to this telephonic visit.    Interactive audio and video telecommunications were attempted between this provider and patient, however failed, due to patient having technical difficulties OR patient did not have access to video capability.  We continued and completed visit with audio only.   Review of Systems    N/A  Cardiac Risk Factors include: advanced age (>54men, >37 women);dyslipidemia;hypertension;male gender     Objective:    There were no vitals filed for this visit. There is no height or weight on file to calculate BMI.  Advanced Directives 05/19/2020 10/14/2019 09/16/2019 09/16/2019 09/14/2019 05/29/2019 05/28/2019  Does Patient Have a Medical Advance Directive? Yes No No No No - Yes  Type of Paramedic of [place];Living will - - - - Press photographer;Living will [place];Living will  Does patient want to make changes to medical advance directive? - - No - Patient declined - - - No - Patient declined  Copy of [place] in Chart? No - copy requested - - - - No - copy requested No - copy requested  Would patient like information on creating a medical advance directive? - No - Patient declined No - Patient declined - No -  Patient declined - -    Current Medications (verified) Outpatient Encounter Medications as of 05/19/2020  Medication Sig  . amLODipine (NORVASC) 5 MG tablet Take 5 mg by mouth daily.  Marland Kitchen atorvastatin (LIPITOR) 80 MG tablet Take 1 tablet (80 mg total) by mouth daily at 6 PM. (Patient taking differently: Take 40 mg by mouth at bedtime. )  . Calcium Carbonate-Vit D-Min (CALCIUM 600+D PLUS MINERALS) 600-400 MG-UNIT TABS Take 1 tablet by mouth daily.  . cholecalciferol (VITAMIN D3) 25 MCG (1000 UNIT) tablet Take 1,000 Units by mouth daily.  . clopidogrel (PLAVIX) 75 MG tablet Take 1 tablet (75 mg total) by mouth daily.  Marland Kitchen Dextromethorphan-guaiFENesin (GERI-TUSSIN DM) 10-100 MG/5ML liquid Take 10 mLs by mouth every 6 (six) hours as needed (cough).  . famotidine (PEPCID) 20 MG tablet Take 20 mg by mouth 2 (two) times daily.  . Ipratropium-Albuterol (COMBIVENT RESPIMAT) 20-100 MCG/ACT AERS respimat Inhale 1 puff into the lungs every 6 (six) hours as needed for wheezing.  . magnesium oxide (MAG-OX) 400 MG tablet Take 400 mg by mouth daily.  . megestrol (MEGACE) 400 MG/10ML suspension Take 400 mg by mouth daily.  . melatonin 1 MG TABS tablet Take 2 mg by mouth at bedtime.  . metoprolol succinate (TOPROL-XL) 25 MG 24 hr tablet Take 25 mg by mouth daily.  . miconazole (MICOTIN) 2 % powder Apply 1 application topically in the morning and at bedtime.  . phenytoin (DILANTIN) 100 MG ER capsule Take 100 mg by mouth 3 (three) times daily.  Marland Kitchen testosterone cypionate (DEPOTESTOSTERONE CYPIONATE) 200 MG/ML injection Inject 200 mg into the muscle every 28 (  twenty-eight) days.  Marland Kitchen zolpidem (AMBIEN) 5 MG tablet Take 5 mg by mouth at bedtime.   No facility-administered encounter medications on file as of 05/19/2020.    Allergies (verified) Celebrex [celecoxib] and Mirtazapine   History: Past Medical History:  Diagnosis Date  . BPH (benign prostatic hyperplasia)   . CAD (coronary artery disease)   . Carpal tunnel  syndrome   . COVID   . DDD (degenerative disc disease), cervical   . Environmental and seasonal allergies   . Fall 09/14/2019   Fell on curb, injured left knee and hit right forehead  . GERD (gastroesophageal reflux disease)   . Glaucoma   . Hyperlipidemia   . Hypertension   . Impotence   . Nocturia   . Presence of permanent cardiac pacemaker   . Weight loss    Past Surgical History:  Procedure Laterality Date  . APPENDECTOMY    . BALLOON DILATION N/A 11/05/2018   Procedure: BALLOON DILATION;  Surgeon: Manya Silvas, MD;  Location: [facility] ENDOSCOPY;  Service: Endoscopy;  Laterality: N/A;  . BILATERAL CARPAL TUNNEL RELEASE     05/12/2011, 06/09/2011  . CARDIAC CATHETERIZATION  2004  . coils in vessel     6 coils  . CORONARY ANGIOPLASTY     x 5  . CORONARY ARTERY BYPASS GRAFT  2007   5; [facility]  . CORONARY STENT PLACEMENT  2005  . ESOPHAGOGASTRODUODENOSCOPY (EGD) WITH PROPOFOL N/A 10/28/2015   Procedure: ESOPHAGOGASTRODUODENOSCOPY (EGD) WITH PROPOFOL;  Surgeon: Manya Silvas, MD;  Location: [facility] ENDOSCOPY;  Service: Endoscopy;  Laterality: N/A;  . ESOPHAGOGASTRODUODENOSCOPY (EGD) WITH PROPOFOL N/A 11/05/2018   Procedure: ESOPHAGOGASTRODUODENOSCOPY (EGD) WITH PROPOFOL;  Surgeon: Manya Silvas, MD;  Location: [facility] ENDOSCOPY;  Service: Endoscopy;  Laterality: N/A;  . KIDNEY STONE SURGERY    . PACEMAKER INSERTION N/A 07/13/2017   Procedure: INSERTION PACEMAKER;  Surgeon: Isaias Cowman, MD;  Location: [facility] ORS;  Service: Cardiovascular;  Laterality: N/A;  . SHOULDER ARTHROSCOPY W/ ROTATOR CUFF REPAIR Bilateral   . TONSILLECTOMY    . UPPER GI ENDOSCOPY  08/02/2010   Dr. Tedra Coupe, gastritis. H Pylori negative   Family History  Problem Relation Age of Onset  . Cirrhosis Father        of liver  . Cancer Daughter   . Kidney disease Neg Hx   . Prostate cancer Neg Hx   . Kidney cancer Neg Hx   . Bladder Cancer Neg Hx    Social History   Socioeconomic History  .  Marital status: Widowed    Spouse name: Not on file  . Number of children: 5  . Years of education: Not on file  . Highest education level: Master's degree (e.g., MA, MS, MEng, MEd, MSW, MBA)  Occupational History  . Occupation: Retired  Tobacco Use  . Smoking status: Former Smoker    Packs/day: 0.50    Years: 20.00    Pack years: 10.00    Quit date: 05/01/1977    Years since quitting: 43.0  . Smokeless tobacco: Never Used  Vaping Use  . Vaping Use: Never used  Substance and Sexual Activity  . Alcohol use: Not Currently  . Drug use: No  . Sexual activity: Not on file  Other Topics Concern  . Not on file  Social History Narrative  . Not on file   Social Determinants of Health   Financial Resource Strain: Low Risk   . Difficulty of Paying Living Expenses: Not hard at all  Food  Insecurity: No Food Insecurity  . Worried About Charity fundraiser in the Last Year: Never true  . Ran Out of Food in the Last Year: Never true  Transportation Needs: No Transportation Needs  . Lack of Transportation (Medical): No  . Lack of Transportation (Non-Medical): No  Physical Activity: Inactive  . Days of Exercise per Week: 0 days  . Minutes of Exercise per Session: 0 min  Stress: No Stress Concern Present  . Feeling of Stress : Not at all  Social Connections: Moderately Isolated  . Frequency of Communication with Friends and Family: More than three times a week  . Frequency of Social Gatherings with Friends and Family: More than three times a week  . Attends Religious Services: More than 4 times per year  . Active Member of Clubs or Organizations: No  . Attends Archivist Meetings: Never  . Marital Status: Widowed    Tobacco Counseling Counseling given: Not Answered   Clinical Intake:  Pre-visit preparation completed: Yes  Pain : No/denies pain     Nutritional Risks: None Diabetes: No  How often do you need to have someone help you when you read instructions,  pamphlets, or other written materials from your doctor or pharmacy?: 1 - Never  Diabetic? No  Interpreter Needed?: No  Information entered by :: [facility], LPN   Activities of Daily Living In your present state of health, do you have any difficulty performing the following activities: 05/19/2020 09/16/2019  Hearing? Y N  Comment Does not wear hearing aids. -  Vision? N N  Difficulty concentrating or making decisions? N N  Walking or climbing stairs? Y N  Comment Avoids steps. -  Dressing or bathing? N N  Doing errands, shopping? Y N  Comment Not driving currently. -  Preparing Food and eating ? Y -  Comment Rehab facility manages meals. -  Using the Toilet? N -  In the past six months, have you accidently leaked urine? N -  Do you have problems with loss of bowel control? N -  Managing your Medications? Y -  Comment Rehab facililty manages medications currently. -  Managing your Finances? Y -  Comment Son manages finances. -  Housekeeping or managing your Housekeeping? Y -  Comment Currently in a rehab facility and does not have to clean. -  Some recent data might be hidden    Patient Care Team: Birdie Sons, MD as PCP - General (Family Medicine) Isaias Cowman, MD as Consulting Physician (Cardiology) Eulogio Bear, MD as Consulting Physician (Ophthalmology) Nori Riis PA-C as Physician Assistant (Urology) [facility] (Dermatology)  Indicate any recent Medical Services you may have received from other than [facility] providers in the past year (date may be approximate).     Assessment:   This is a routine wellness examination for Ivan Salazar.  Hearing/Vision screen No exam data present  Dietary issues and exercise activities discussed: Current Exercise Habits: Home exercise routine, Type of exercise: stretching;walking (PT exercises), Time (Minutes): 30, Frequency (Times/Week): 7, Weekly Exercise (Minutes/Week): 210, Intensity: Mild, Exercise  limited by: orthopedic condition(s)  Goals    . DIET - INCREASE WATER INTAKE     Recommend to drink at least 6-8 8oz glasses of water per day.        Marland Kitchen LIFESTYLE - DECREASE FALLS RISK     Recommend to remove any items from the home that may cause slips or trips.      Depression Screen PHQ  2/9 Scores 05/19/2020 05/15/2019 05/15/2019 05/13/2019 04/18/2018 11/27/2017 04/05/2017  PHQ - 2 Score 0 0 0 0 1 1 0  PHQ- 9 Score - - - - - 3 -    Fall Risk Fall Risk  05/19/2020 05/15/2019 05/13/2019 04/18/2018 11/27/2017  Falls in the past year? 1 0 0 0 Yes  Number falls in past yr: 1 0 0 0 1  Injury with Fall? 1 0 0 0 Yes  Risk for fall due to : Impaired balance/gait;Impaired mobility Impaired balance/gait - - -  Follow up Falls prevention discussed - - - -    FALL RISK PREVENTION PERTAINING TO THE HOME:  Any stairs in or around the home? No  If so, are there any without handrails? No  Home free of loose throw rugs in walkways, pet beds, electrical cords, etc? Yes  Adequate lighting in your home to reduce risk of falls? Yes   ASSISTIVE DEVICES UTILIZED TO PREVENT FALLS:  Life alert? Yes  Use of a cane, walker or w/c? Yes  Grab bars in the bathroom? Yes  Shower chair or bench in shower? Yes  Elevated toilet seat or a handicapped toilet? Yes    Cognitive Function: Declined today.         Immunizations Immunization History  Administered Date(s) Administered  . Fluad Quad(high Dose 65+) 03/06/2019  . Influenza, High Dose Seasonal PF 02/17/2015, 01/11/2017, 02/06/2018, 04/01/2020  . Influenza-Unspecified 01/29/2016  . PFIZER(Purple Top)SARS-COV-2 Vaccination 07/05/2019, 07/26/2019  . Pneumococcal Conjugate-13 05/11/2017  . Pneumococcal Polysaccharide-23 05/29/2007  . Td 03/20/2002, 02/19/2008, 10/26/2017  . Zoster 02/09/2010    TDAP status: Up to date  Flu Vaccine status: Up to date  Pneumococcal vaccine status: Up to date  Covid-19 vaccine status: Completed  vaccines  Qualifies for Shingles Vaccine? Yes   Zostavax completed Yes   Shingrix Completed?: No.    Education has been provided regarding the importance of this vaccine. Patient has been advised to call insurance company to determine out of pocket expense if they have not yet received this vaccine. Advised may also receive vaccine at local pharmacy or Health Dept. Verbalized acceptance and understanding.  Screening Tests Health Maintenance  Topic Date Due  . COVID-19 Vaccine (3 - Pfizer risk 4-dose series) 08/23/2019  . TETANUS/TDAP  10/27/2027  . INFLUENZA VACCINE  Completed  . PNA vac Low Risk Adult  Completed    Health Maintenance  Health Maintenance Due  Topic Date Due  . COVID-19 Vaccine (3 - Pfizer risk 4-dose series) 08/23/2019    Colorectal cancer screening: No longer required.   Lung Cancer Screening: (Low Dose CT Chest recommended if Age 34-80 years, 30 pack-year currently smoking OR have quit w/in 15years.) does not qualify.   Additional Screening:  Vision Screening: Recommended annual ophthalmology exams for early detection of glaucoma and other disorders of the eye. Is the patient up to date with their annual eye exam?  Yes  Who is the provider or what is the name of the office in which the patient attends annual eye exams? Dr Edison Pace @ [place] If pt is not established with a provider, would they like to be referred to a provider to establish care? No .   Dental Screening: Recommended annual dental exams for proper oral hygiene  Community Resource Referral / Chronic Care Management: CRR required this visit?  No   CCM required this visit?  No      Plan:     I have personally reviewed and noted the following  in the patient's chart:   . Medical and social history . Use of alcohol, tobacco or illicit drugs  . Current medications and supplements . Functional ability and status . Nutritional status . Physical activity . Advanced directives . List of other  physicians . Hospitalizations, surgeries, and ER visits in previous 12 months . Vitals . Screenings to include cognitive, depression, and falls . Referrals and appointments  In addition, I have reviewed and discussed with patient certain preventive protocols, quality metrics, and best practice recommendations. A written personalized care plan for preventive services as well as general preventive health recommendations were provided to patient.     Ivan Salazar, [place]   [id number]   Nurse Notes: Pt states he has received his Covid booster however he did not have the information on hand. Requested information at next in office apt.

## 2020-05-19 ENCOUNTER — Other Ambulatory Visit: Payer: Self-pay

## 2020-05-19 ENCOUNTER — Ambulatory Visit (INDEPENDENT_AMBULATORY_CARE_PROVIDER_SITE_OTHER): Payer: Medicare Other

## 2020-05-19 DIAGNOSIS — Z0000 Encounter for general adult medical examination without abnormal findings: Secondary | ICD-10-CM

## 2020-05-19 NOTE — Patient Instructions (Signed)
Ivan Salazar , Thank you for taking time to come for your Medicare Wellness Visit. I appreciate your ongoing commitment to your health goals. Please review the following plan we discussed and let me know if I can assist you in the future.   Screening recommendations/referrals: Colonoscopy: No longer required.  Recommended yearly ophthalmology/optometry visit for glaucoma screening and checkup Recommended yearly dental visit for hygiene and checkup  Vaccinations: Influenza vaccine: Completed 04/2020 Pneumococcal vaccine: Completed series Tdap vaccine: Up to date, due 10/2027 Shingles vaccine: Shingrix discussed. Please contact your pharmacy for coverage information.     Advanced directives: Please bring a copy of your POA (Power of Attorney) and/or Living Will to your next appointment.   Conditions/risks identified: Fall risk preventatives discussed today. Recommend to increase water intake to 6-8 8 oz glasses a day.   Next appointment: None, declined scheduling a follow up with PCP or an AWV for 2023  at this time.   Preventive Care 33 Years and Older, Male Preventive care refers to lifestyle choices and visits with your health care provider that can promote health and wellness. What does preventive care include?  A yearly physical exam. This is also called an annual well check.  Dental exams once or twice a year.  Routine eye exams. Ask your health care provider how often you should have your eyes checked.  Personal lifestyle choices, including:  Daily care of your teeth and gums.  Regular physical activity.  Eating a healthy diet.  Avoiding tobacco and drug use.  Limiting alcohol use.  Practicing safe sex.  Taking low doses of aspirin every day.  Taking vitamin and mineral supplements as recommended by your health care provider. What happens during an annual well check? The services and screenings done by your health care provider during your annual well check will  depend on your age, overall health, lifestyle risk factors, and family history of disease. Counseling  Your health care provider may ask you questions about your:  Alcohol use.  Tobacco use.  Drug use.  Emotional well-being.  Home and relationship well-being.  Sexual activity.  Eating habits.  History of falls.  Memory and ability to understand (cognition).  Work and work Statistician. Screening  You may have the following tests or measurements:  Height, weight, and BMI.  Blood pressure.  Lipid and cholesterol levels. These may be checked every 5 years, or more frequently if you are over 16 years old.  Skin check.  Lung cancer screening. You may have this screening every year starting at age 58 if you have a 30-pack-year history of smoking and currently smoke or have quit within the past 15 years.  Fecal occult blood test (FOBT) of the stool. You may have this test every year starting at age 10.  Flexible sigmoidoscopy or colonoscopy. You may have a sigmoidoscopy every 5 years or a colonoscopy every 10 years starting at age 39.  Prostate cancer screening. Recommendations will vary depending on your family history and other risks.  Hepatitis C blood test.  Hepatitis B blood test.  Sexually transmitted disease (STD) testing.  Diabetes screening. This is done by checking your blood sugar (glucose) after you have not eaten for a while (fasting). You may have this done every 1-3 years.  Abdominal aortic aneurysm (AAA) screening. You may need this if you are a current or former smoker.  Osteoporosis. You may be screened starting at age 61 if you are at high risk. Talk with your health care provider about  your test results, treatment options, and if necessary, the need for more tests. Vaccines  Your health care provider may recommend certain vaccines, such as:  Influenza vaccine. This is recommended every year.  Tetanus, diphtheria, and acellular pertussis (Tdap,  Td) vaccine. You may need a Td booster every 10 years.  Zoster vaccine. You may need this after age 54.  Pneumococcal 13-valent conjugate (PCV13) vaccine. One dose is recommended after age 62.  Pneumococcal polysaccharide (PPSV23) vaccine. One dose is recommended after age 77. Talk to your health care provider about which screenings and vaccines you need and how often you need them. This information is not intended to replace advice given to you by your health care provider. Make sure you discuss any questions you have with your health care provider. Document Released: 05/15/2015 Document Revised: 01/06/2016 Document Reviewed: 02/17/2015 Elsevier Interactive Patient Education  2017 [place] Prevention in the Home Falls can cause injuries. They can happen to people of all ages. There are many things you can do to make your home safe and to help prevent falls. What can I do on the outside of my home?  Regularly fix the edges of walkways and driveways and fix any cracks.  Remove anything that might make you trip as you walk through a door, such as a raised step or threshold.  Trim any bushes or trees on the path to your home.  Use bright outdoor lighting.  Clear any walking paths of anything that might make someone trip, such as rocks or tools.  Regularly check to see if handrails are loose or broken. Make sure that both sides of any steps have handrails.  Any raised decks and porches should have guardrails on the edges.  Have any leaves, snow, or ice cleared regularly.  Use sand or salt on walking paths during winter.  Clean up any spills in your garage right away. This includes oil or grease spills. What can I do in the bathroom?  Use night lights.  Install grab bars by the toilet and in the tub and shower. Do not use towel bars as grab bars.  Use non-skid mats or decals in the tub or shower.  If you need to sit down in the shower, use a plastic, non-slip  stool.  Keep the floor dry. Clean up any water that spills on the floor as soon as it happens.  Remove soap buildup in the tub or shower regularly.  Attach bath mats securely with double-sided non-slip rug tape.  Do not have throw rugs and other things on the floor that can make you trip. What can I do in the bedroom?  Use night lights.  Make sure that you have a light by your bed that is easy to reach.  Do not use any sheets or blankets that are too big for your bed. They should not hang down onto the floor.  Have a firm chair that has side arms. You can use this for support while you get dressed.  Do not have throw rugs and other things on the floor that can make you trip. What can I do in the kitchen?  Clean up any spills right away.  Avoid walking on wet floors.  Keep items that you use a lot in easy-to-reach places.  If you need to reach something above you, use a strong step stool that has a grab bar.  Keep electrical cords out of the way.  Do not use floor polish or wax  that makes floors slippery. If you must use wax, use non-skid floor wax.  Do not have throw rugs and other things on the floor that can make you trip. What can I do with my stairs?  Do not leave any items on the stairs.  Make sure that there are handrails on both sides of the stairs and use them. Fix handrails that are broken or loose. Make sure that handrails are as long as the stairways.  Check any carpeting to make sure that it is firmly attached to the stairs. Fix any carpet that is loose or worn.  Avoid having throw rugs at the top or bottom of the stairs. If you do have throw rugs, attach them to the floor with carpet tape.  Make sure that you have a light switch at the top of the stairs and the bottom of the stairs. If you do not have them, ask someone to add them for you. What else can I do to help prevent falls?  Wear shoes that:  Do not have high heels.  Have rubber bottoms.  Are  comfortable and fit you well.  Are closed at the toe. Do not wear sandals.  If you use a stepladder:  Make sure that it is fully opened. Do not climb a closed stepladder.  Make sure that both sides of the stepladder are locked into place.  Ask someone to hold it for you, if possible.  Clearly mark and make sure that you can see:  Any grab bars or handrails.  First and last steps.  Where the edge of each step is.  Use tools that help you move around (mobility aids) if they are needed. These include:  Canes.  Walkers.  Scooters.  Crutches.  Turn on the lights when you go into a dark area. Replace any light bulbs as soon as they burn out.  Set up your furniture so you have a clear path. Avoid moving your furniture around.  If any of your floors are uneven, fix them.  If there are any pets around you, be aware of where they are.  Review your medicines with your doctor. Some medicines can make you feel dizzy. This can increase your chance of falling. Ask your doctor what other things that you can do to help prevent falls. This information is not intended to replace advice given to you by your health care provider. Make sure you discuss any questions you have with your health care provider. Document Released: 02/12/2009 Document Revised: 09/24/2015 Document Reviewed: 05/23/2014 Elsevier Interactive Patient Education  2017 [employer].

## 2020-07-27 ENCOUNTER — Encounter: Payer: Medicare Other | Admitting: Dermatology

## 2023-01-01 DEATH — deceased
